# Patient Record
Sex: Male | Born: 1940
Health system: Southern US, Community
[De-identification: ages and names within clinical notes are randomized; demographics above are authoritative.]

## PROBLEM LIST (undated history)

## (undated) DIAGNOSIS — K219 Gastro-esophageal reflux disease without esophagitis: Secondary | ICD-10-CM

## (undated) DIAGNOSIS — I10 Essential (primary) hypertension: Secondary | ICD-10-CM

## (undated) DIAGNOSIS — K7290 Hepatic failure, unspecified without coma: Secondary | ICD-10-CM

## (undated) DIAGNOSIS — R635 Abnormal weight gain: Secondary | ICD-10-CM

## (undated) DIAGNOSIS — C159 Malignant neoplasm of esophagus, unspecified: Secondary | ICD-10-CM

## (undated) DIAGNOSIS — H269 Unspecified cataract: Secondary | ICD-10-CM

## (undated) DIAGNOSIS — R197 Diarrhea, unspecified: Secondary | ICD-10-CM

## (undated) DIAGNOSIS — E876 Hypokalemia: Secondary | ICD-10-CM

## (undated) DIAGNOSIS — E785 Hyperlipidemia, unspecified: Secondary | ICD-10-CM

## (undated) DIAGNOSIS — M1990 Unspecified osteoarthritis, unspecified site: Secondary | ICD-10-CM

## (undated) DIAGNOSIS — N179 Acute kidney failure, unspecified: Secondary | ICD-10-CM

## (undated) DIAGNOSIS — R0602 Shortness of breath: Secondary | ICD-10-CM

## (undated) DIAGNOSIS — H353290 Exudative age-related macular degeneration, unspecified eye, stage unspecified: Secondary | ICD-10-CM

## (undated) DIAGNOSIS — H409 Unspecified glaucoma: Secondary | ICD-10-CM

## (undated) DIAGNOSIS — D509 Iron deficiency anemia, unspecified: Secondary | ICD-10-CM

## (undated) DIAGNOSIS — F1010 Alcohol abuse, uncomplicated: Secondary | ICD-10-CM

## (undated) DIAGNOSIS — R600 Localized edema: Secondary | ICD-10-CM

## (undated) HISTORY — DX: Localized edema: R60.0

## (undated) HISTORY — DX: Malignant neoplasm of esophagus, unspecified: C15.9

## (undated) HISTORY — DX: Other disorders of bilirubin metabolism: E80.6

## (undated) HISTORY — PX: ANKLE SURGERY: SHX546

## (undated) HISTORY — DX: Unspecified glaucoma: H40.9

## (undated) HISTORY — DX: Diarrhea, unspecified: R19.7

## (undated) HISTORY — PX: EYE SURGERY: SHX253

## (undated) HISTORY — DX: Shortness of breath: R06.02

## (undated) HISTORY — DX: Unspecified cataract: H26.9

## (undated) HISTORY — DX: Exudative age-related macular degeneration, unspecified eye, stage unspecified: H35.3290

## (undated) HISTORY — DX: Abnormal weight gain: R63.5

## (undated) HISTORY — DX: Hypokalemia: E87.6

## (undated) HISTORY — DX: Iron deficiency anemia, unspecified: D50.9

## (undated) HISTORY — DX: Alcohol abuse, uncomplicated: F10.10

## (undated) HISTORY — DX: Gastro-esophageal reflux disease without esophagitis: K21.9

## (undated) HISTORY — DX: Hepatic failure, unspecified without coma: K72.90

## (undated) HISTORY — DX: Unspecified osteoarthritis, unspecified site: M19.90

## (undated) HISTORY — PX: CATARACT EXTRACTION, BILATERAL: SHX1313

## (undated) HISTORY — DX: Acute kidney failure, unspecified: N17.9

## (undated) HISTORY — PX: TONSILLECTOMY: SUR1361

---

## 2002-04-17 ENCOUNTER — Encounter (INDEPENDENT_AMBULATORY_CARE_PROVIDER_SITE_OTHER): Payer: Self-pay | Admitting: Specialist

## 2002-04-17 ENCOUNTER — Ambulatory Visit (HOSPITAL_COMMUNITY): Admission: RE | Admit: 2002-04-17 | Discharge: 2002-04-17 | Payer: Self-pay | Admitting: Gastroenterology

## 2004-10-13 ENCOUNTER — Encounter: Admission: RE | Admit: 2004-10-13 | Discharge: 2004-10-13 | Payer: Self-pay | Admitting: Family Medicine

## 2004-10-16 ENCOUNTER — Encounter: Admission: RE | Admit: 2004-10-16 | Discharge: 2004-10-16 | Payer: Self-pay | Admitting: Family Medicine

## 2005-02-17 ENCOUNTER — Encounter: Admission: RE | Admit: 2005-02-17 | Discharge: 2005-02-17 | Payer: Self-pay | Admitting: Family Medicine

## 2005-02-23 ENCOUNTER — Encounter: Admission: RE | Admit: 2005-02-23 | Discharge: 2005-02-23 | Payer: Self-pay | Admitting: Family Medicine

## 2005-05-04 ENCOUNTER — Encounter: Admission: RE | Admit: 2005-05-04 | Discharge: 2005-05-04 | Payer: Self-pay | Admitting: Family Medicine

## 2006-04-12 ENCOUNTER — Encounter: Admission: RE | Admit: 2006-04-12 | Discharge: 2006-04-12 | Payer: Self-pay | Admitting: Family Medicine

## 2008-12-13 ENCOUNTER — Encounter: Admission: RE | Admit: 2008-12-13 | Discharge: 2008-12-13 | Payer: Self-pay | Admitting: Internal Medicine

## 2010-04-21 ENCOUNTER — Other Ambulatory Visit: Payer: Self-pay | Admitting: Dermatology

## 2010-08-15 NOTE — Op Note (Signed)
   NAME:  Randy Wood, Randy Wood NO.:  [id number]   MEDICAL RECORD NO.:  [id number]                    PATIENT TYPE:   LOCATION:                                       FACILITY:   PHYSICIAN:  Cheng C. Madilyn Fireman, M.D.                 DATE OF BIRTH:   DATE OF PROCEDURE:  04/17/2002  DATE OF DISCHARGE:                                 OPERATIVE REPORT   PROCEDURE:  Colonoscopy.   INDICATION FOR PROCEDURE:  Screening colonoscopy.   DESCRIPTION OF PROCEDURE:  The patient was placed in the left lateral  decubitus position and placed on the pulse monitor with continuous low-flow  oxygen delivered by nasal cannula.  He was sedated with 100 mcg IV fentanyl  and 8 mg IV Versed.  The Olympus video colonoscope was inserted into the  rectum and advanced to the cecum, confirmed by transillumination of  McBurney's point and visualization of the ileocecal valve and appendiceal  orifice.  The prep was excellent.  The cecum, ascending, transverse,  descending, and sigmoid colon appeared normal with no masses, polyps,  diverticula, or other mucosal abnormalities.  At the rectosigmoid junction  there was a 6 mm polyp fulgurated by hot biopsy and another 6 mm polyp  fulgurated in the rectum at approximately 5 cm from the anal verge.  The  scope was then withdrawn and the patient returned to the recovery room in  stable condition.  He tolerated the procedure well.  There were no immediate  complications.   IMPRESSION:  Rectosigmoid and rectal polyps.   PLAN:  Await histology to determine method and interval for future colon  screening.                                               Rocko C. Madilyn Fireman, M.D.    J[facility]/MEDQ  D:  04/17/2002  T:  04/17/2002  Job:  [id number]   cc:   Jethro Bastos, M.D.  [address]  [place]  [place] [address]  Fax: [phone]

## 2010-10-03 ENCOUNTER — Other Ambulatory Visit: Payer: Self-pay | Admitting: Dermatology

## 2011-03-13 ENCOUNTER — Ambulatory Visit
Admission: RE | Admit: 2011-03-13 | Discharge: 2011-03-13 | Disposition: A | Discharge status: Home / Self Care | Payer: Medicare Other | Source: Ambulatory Visit | Attending: Allergy and Immunology | Admitting: Allergy and Immunology

## 2011-03-13 ENCOUNTER — Other Ambulatory Visit: Payer: Self-pay | Admitting: Allergy and Immunology

## 2011-03-13 DIAGNOSIS — R059 Cough, unspecified: Secondary | ICD-10-CM

## 2011-03-13 DIAGNOSIS — R05 Cough: Secondary | ICD-10-CM

## 2011-04-06 DIAGNOSIS — J309 Allergic rhinitis, unspecified: Secondary | ICD-10-CM | POA: Diagnosis not present

## 2011-05-07 DIAGNOSIS — M25569 Pain in unspecified knee: Secondary | ICD-10-CM | POA: Diagnosis not present

## 2011-05-07 DIAGNOSIS — Z1331 Encounter for screening for depression: Secondary | ICD-10-CM | POA: Diagnosis not present

## 2011-05-13 DIAGNOSIS — J309 Allergic rhinitis, unspecified: Secondary | ICD-10-CM | POA: Diagnosis not present

## 2011-05-25 ENCOUNTER — Other Ambulatory Visit: Payer: Self-pay | Admitting: Dermatology

## 2011-05-25 DIAGNOSIS — L819 Disorder of pigmentation, unspecified: Secondary | ICD-10-CM | POA: Diagnosis not present

## 2011-05-25 DIAGNOSIS — L821 Other seborrheic keratosis: Secondary | ICD-10-CM | POA: Diagnosis not present

## 2011-05-25 DIAGNOSIS — L089 Local infection of the skin and subcutaneous tissue, unspecified: Secondary | ICD-10-CM | POA: Diagnosis not present

## 2011-05-25 DIAGNOSIS — D239 Other benign neoplasm of skin, unspecified: Secondary | ICD-10-CM | POA: Diagnosis not present

## 2011-05-25 DIAGNOSIS — D485 Neoplasm of uncertain behavior of skin: Secondary | ICD-10-CM | POA: Diagnosis not present

## 2011-05-25 DIAGNOSIS — D1801 Hemangioma of skin and subcutaneous tissue: Secondary | ICD-10-CM | POA: Diagnosis not present

## 2011-05-26 ENCOUNTER — Other Ambulatory Visit: Payer: Self-pay | Admitting: Dermatology

## 2011-05-26 DIAGNOSIS — L908 Other atrophic disorders of skin: Secondary | ICD-10-CM | POA: Diagnosis not present

## 2011-05-26 DIAGNOSIS — Q828 Other specified congenital malformations of skin: Secondary | ICD-10-CM | POA: Diagnosis not present

## 2011-06-04 DIAGNOSIS — T63461A Toxic effect of venom of wasps, accidental (unintentional), initial encounter: Secondary | ICD-10-CM | POA: Diagnosis not present

## 2011-06-04 DIAGNOSIS — T6391XA Toxic effect of contact with unspecified venomous animal, accidental (unintentional), initial encounter: Secondary | ICD-10-CM | POA: Diagnosis not present

## 2011-06-09 DIAGNOSIS — Z961 Presence of intraocular lens: Secondary | ICD-10-CM | POA: Diagnosis not present

## 2011-06-09 DIAGNOSIS — H409 Unspecified glaucoma: Secondary | ICD-10-CM | POA: Diagnosis not present

## 2011-06-09 DIAGNOSIS — H401490 Capsular glaucoma with pseudoexfoliation of lens, unspecified eye, stage unspecified: Secondary | ICD-10-CM | POA: Diagnosis not present

## 2011-06-10 DIAGNOSIS — K219 Gastro-esophageal reflux disease without esophagitis: Secondary | ICD-10-CM | POA: Diagnosis not present

## 2011-06-10 DIAGNOSIS — M7020 Olecranon bursitis, unspecified elbow: Secondary | ICD-10-CM | POA: Diagnosis not present

## 2011-06-10 DIAGNOSIS — I10 Essential (primary) hypertension: Secondary | ICD-10-CM | POA: Diagnosis not present

## 2011-06-10 DIAGNOSIS — M25569 Pain in unspecified knee: Secondary | ICD-10-CM | POA: Diagnosis not present

## 2011-07-06 DIAGNOSIS — J309 Allergic rhinitis, unspecified: Secondary | ICD-10-CM | POA: Diagnosis not present

## 2011-07-08 ENCOUNTER — Other Ambulatory Visit: Payer: Self-pay | Admitting: Dermatology

## 2011-07-08 DIAGNOSIS — D485 Neoplasm of uncertain behavior of skin: Secondary | ICD-10-CM | POA: Diagnosis not present

## 2011-08-10 DIAGNOSIS — J309 Allergic rhinitis, unspecified: Secondary | ICD-10-CM | POA: Diagnosis not present

## 2011-08-25 DIAGNOSIS — L259 Unspecified contact dermatitis, unspecified cause: Secondary | ICD-10-CM | POA: Diagnosis not present

## 2011-09-01 DIAGNOSIS — J309 Allergic rhinitis, unspecified: Secondary | ICD-10-CM | POA: Diagnosis not present

## 2011-10-05 DIAGNOSIS — J309 Allergic rhinitis, unspecified: Secondary | ICD-10-CM | POA: Diagnosis not present

## 2011-11-13 DIAGNOSIS — J309 Allergic rhinitis, unspecified: Secondary | ICD-10-CM | POA: Diagnosis not present

## 2011-11-23 DIAGNOSIS — D239 Other benign neoplasm of skin, unspecified: Secondary | ICD-10-CM | POA: Diagnosis not present

## 2011-11-23 DIAGNOSIS — D1801 Hemangioma of skin and subcutaneous tissue: Secondary | ICD-10-CM | POA: Diagnosis not present

## 2011-12-10 DIAGNOSIS — H353190 Nonexudative age-related macular degeneration, unspecified eye, stage unspecified: Secondary | ICD-10-CM | POA: Diagnosis not present

## 2011-12-10 DIAGNOSIS — H40019 Open angle with borderline findings, low risk, unspecified eye: Secondary | ICD-10-CM | POA: Diagnosis not present

## 2011-12-10 DIAGNOSIS — Z961 Presence of intraocular lens: Secondary | ICD-10-CM | POA: Diagnosis not present

## 2011-12-11 DIAGNOSIS — K219 Gastro-esophageal reflux disease without esophagitis: Secondary | ICD-10-CM | POA: Diagnosis not present

## 2011-12-11 DIAGNOSIS — I10 Essential (primary) hypertension: Secondary | ICD-10-CM | POA: Diagnosis not present

## 2011-12-11 DIAGNOSIS — J309 Allergic rhinitis, unspecified: Secondary | ICD-10-CM | POA: Diagnosis not present

## 2011-12-14 DIAGNOSIS — J3089 Other allergic rhinitis: Secondary | ICD-10-CM | POA: Diagnosis not present

## 2011-12-18 DIAGNOSIS — Z23 Encounter for immunization: Secondary | ICD-10-CM | POA: Diagnosis not present

## 2012-01-11 DIAGNOSIS — S8263XA Displaced fracture of lateral malleolus of unspecified fibula, initial encounter for closed fracture: Secondary | ICD-10-CM | POA: Diagnosis not present

## 2012-01-12 DIAGNOSIS — J309 Allergic rhinitis, unspecified: Secondary | ICD-10-CM | POA: Diagnosis not present

## 2012-01-19 DIAGNOSIS — S8263XA Displaced fracture of lateral malleolus of unspecified fibula, initial encounter for closed fracture: Secondary | ICD-10-CM | POA: Diagnosis not present

## 2012-01-19 DIAGNOSIS — M25569 Pain in unspecified knee: Secondary | ICD-10-CM | POA: Diagnosis not present

## 2012-01-26 DIAGNOSIS — L600 Ingrowing nail: Secondary | ICD-10-CM | POA: Diagnosis not present

## 2012-01-26 DIAGNOSIS — M25549 Pain in joints of unspecified hand: Secondary | ICD-10-CM | POA: Diagnosis not present

## 2012-01-26 DIAGNOSIS — L03019 Cellulitis of unspecified finger: Secondary | ICD-10-CM | POA: Diagnosis not present

## 2012-01-29 ENCOUNTER — Ambulatory Visit (HOSPITAL_COMMUNITY)
Admission: RE | Admit: 2012-01-29 | Discharge: 2012-01-29 | Disposition: A | Discharge status: Home / Self Care | Payer: Medicare Other | Source: Ambulatory Visit | Attending: Family Medicine | Admitting: Family Medicine

## 2012-01-29 DIAGNOSIS — M7989 Other specified soft tissue disorders: Secondary | ICD-10-CM

## 2012-01-29 DIAGNOSIS — R609 Edema, unspecified: Secondary | ICD-10-CM

## 2012-01-29 DIAGNOSIS — S8263XA Displaced fracture of lateral malleolus of unspecified fibula, initial encounter for closed fracture: Secondary | ICD-10-CM | POA: Diagnosis not present

## 2012-01-29 DIAGNOSIS — R52 Pain, unspecified: Secondary | ICD-10-CM

## 2012-01-29 NOTE — Progress Notes (Signed)
*  Preliminary Results* Right lower extremity venous duplex completed. Right lower extremity is negative for deep vein thrombosis. Preliminary results discussed with Morrie Sheldon of Dr. Prince Rome' office.  01/29/2012 10:56 AM Gertie Fey, RDMS, RDCS

## 2012-02-01 DIAGNOSIS — L600 Ingrowing nail: Secondary | ICD-10-CM | POA: Diagnosis not present

## 2012-02-01 DIAGNOSIS — L03019 Cellulitis of unspecified finger: Secondary | ICD-10-CM | POA: Diagnosis not present

## 2012-02-09 DIAGNOSIS — L03019 Cellulitis of unspecified finger: Secondary | ICD-10-CM | POA: Diagnosis not present

## 2012-02-09 DIAGNOSIS — L600 Ingrowing nail: Secondary | ICD-10-CM | POA: Diagnosis not present

## 2012-02-16 DIAGNOSIS — T6391XA Toxic effect of contact with unspecified venomous animal, accidental (unintentional), initial encounter: Secondary | ICD-10-CM | POA: Diagnosis not present

## 2012-02-16 DIAGNOSIS — T63461A Toxic effect of venom of wasps, accidental (unintentional), initial encounter: Secondary | ICD-10-CM | POA: Diagnosis not present

## 2012-02-19 DIAGNOSIS — S8263XA Displaced fracture of lateral malleolus of unspecified fibula, initial encounter for closed fracture: Secondary | ICD-10-CM | POA: Diagnosis not present

## 2012-03-24 DIAGNOSIS — T6391XA Toxic effect of contact with unspecified venomous animal, accidental (unintentional), initial encounter: Secondary | ICD-10-CM | POA: Diagnosis not present

## 2012-04-18 DIAGNOSIS — S8263XA Displaced fracture of lateral malleolus of unspecified fibula, initial encounter for closed fracture: Secondary | ICD-10-CM | POA: Diagnosis not present

## 2012-04-18 DIAGNOSIS — M25579 Pain in unspecified ankle and joints of unspecified foot: Secondary | ICD-10-CM | POA: Diagnosis not present

## 2012-04-19 ENCOUNTER — Other Ambulatory Visit: Payer: Self-pay | Admitting: Family Medicine

## 2012-04-19 DIAGNOSIS — T6391XA Toxic effect of contact with unspecified venomous animal, accidental (unintentional), initial encounter: Secondary | ICD-10-CM | POA: Diagnosis not present

## 2012-04-19 DIAGNOSIS — M25571 Pain in right ankle and joints of right foot: Secondary | ICD-10-CM

## 2012-04-19 DIAGNOSIS — T63461A Toxic effect of venom of wasps, accidental (unintentional), initial encounter: Secondary | ICD-10-CM | POA: Diagnosis not present

## 2012-04-23 ENCOUNTER — Ambulatory Visit
Admission: RE | Admit: 2012-04-23 | Discharge: 2012-04-23 | Disposition: A | Discharge status: Home / Self Care | Payer: Medicare Other | Source: Ambulatory Visit | Attending: Family Medicine | Admitting: Family Medicine

## 2012-04-23 DIAGNOSIS — M7989 Other specified soft tissue disorders: Secondary | ICD-10-CM | POA: Diagnosis not present

## 2012-04-23 DIAGNOSIS — M25571 Pain in right ankle and joints of right foot: Secondary | ICD-10-CM

## 2012-04-27 DIAGNOSIS — S8263XA Displaced fracture of lateral malleolus of unspecified fibula, initial encounter for closed fracture: Secondary | ICD-10-CM | POA: Diagnosis not present

## 2012-05-10 DIAGNOSIS — G8918 Other acute postprocedural pain: Secondary | ICD-10-CM | POA: Diagnosis not present

## 2012-05-10 DIAGNOSIS — IMO0002 Reserved for concepts with insufficient information to code with codable children: Secondary | ICD-10-CM | POA: Diagnosis not present

## 2012-05-10 DIAGNOSIS — S8263XA Displaced fracture of lateral malleolus of unspecified fibula, initial encounter for closed fracture: Secondary | ICD-10-CM | POA: Diagnosis not present

## 2012-05-24 DIAGNOSIS — S8263XA Displaced fracture of lateral malleolus of unspecified fibula, initial encounter for closed fracture: Secondary | ICD-10-CM | POA: Diagnosis not present

## 2012-05-26 DIAGNOSIS — T63461A Toxic effect of venom of wasps, accidental (unintentional), initial encounter: Secondary | ICD-10-CM | POA: Diagnosis not present

## 2012-05-26 DIAGNOSIS — T6391XA Toxic effect of contact with unspecified venomous animal, accidental (unintentional), initial encounter: Secondary | ICD-10-CM | POA: Diagnosis not present

## 2012-06-09 ENCOUNTER — Other Ambulatory Visit: Payer: Self-pay | Admitting: Gastroenterology

## 2012-06-09 DIAGNOSIS — Z09 Encounter for follow-up examination after completed treatment for conditions other than malignant neoplasm: Secondary | ICD-10-CM | POA: Diagnosis not present

## 2012-06-09 DIAGNOSIS — D126 Benign neoplasm of colon, unspecified: Secondary | ICD-10-CM | POA: Diagnosis not present

## 2012-06-09 DIAGNOSIS — Z86010 Personal history of colonic polyps: Secondary | ICD-10-CM | POA: Diagnosis not present

## 2012-06-17 DIAGNOSIS — S8263XA Displaced fracture of lateral malleolus of unspecified fibula, initial encounter for closed fracture: Secondary | ICD-10-CM | POA: Diagnosis not present

## 2012-07-06 DIAGNOSIS — J3089 Other allergic rhinitis: Secondary | ICD-10-CM | POA: Diagnosis not present

## 2012-07-14 DIAGNOSIS — S8263XA Displaced fracture of lateral malleolus of unspecified fibula, initial encounter for closed fracture: Secondary | ICD-10-CM | POA: Diagnosis not present

## 2012-07-21 DIAGNOSIS — H409 Unspecified glaucoma: Secondary | ICD-10-CM | POA: Diagnosis not present

## 2012-07-21 DIAGNOSIS — H40019 Open angle with borderline findings, low risk, unspecified eye: Secondary | ICD-10-CM | POA: Diagnosis not present

## 2012-07-21 DIAGNOSIS — H401490 Capsular glaucoma with pseudoexfoliation of lens, unspecified eye, stage unspecified: Secondary | ICD-10-CM | POA: Diagnosis not present

## 2012-08-03 DIAGNOSIS — Z1331 Encounter for screening for depression: Secondary | ICD-10-CM | POA: Diagnosis not present

## 2012-08-03 DIAGNOSIS — Z0000 Encounter for general adult medical examination without abnormal findings: Secondary | ICD-10-CM | POA: Diagnosis not present

## 2012-08-03 DIAGNOSIS — E785 Hyperlipidemia, unspecified: Secondary | ICD-10-CM | POA: Diagnosis not present

## 2012-08-03 DIAGNOSIS — Z125 Encounter for screening for malignant neoplasm of prostate: Secondary | ICD-10-CM | POA: Diagnosis not present

## 2012-08-03 DIAGNOSIS — I10 Essential (primary) hypertension: Secondary | ICD-10-CM | POA: Diagnosis not present

## 2012-08-08 DIAGNOSIS — T6391XA Toxic effect of contact with unspecified venomous animal, accidental (unintentional), initial encounter: Secondary | ICD-10-CM | POA: Diagnosis not present

## 2012-09-12 DIAGNOSIS — I10 Essential (primary) hypertension: Secondary | ICD-10-CM | POA: Diagnosis not present

## 2012-09-19 DIAGNOSIS — T63461A Toxic effect of venom of wasps, accidental (unintentional), initial encounter: Secondary | ICD-10-CM | POA: Diagnosis not present

## 2012-09-19 DIAGNOSIS — T6391XA Toxic effect of contact with unspecified venomous animal, accidental (unintentional), initial encounter: Secondary | ICD-10-CM | POA: Diagnosis not present

## 2012-10-21 DIAGNOSIS — J309 Allergic rhinitis, unspecified: Secondary | ICD-10-CM | POA: Diagnosis not present

## 2012-11-21 DIAGNOSIS — J309 Allergic rhinitis, unspecified: Secondary | ICD-10-CM | POA: Diagnosis not present

## 2012-11-25 DIAGNOSIS — J309 Allergic rhinitis, unspecified: Secondary | ICD-10-CM | POA: Diagnosis not present

## 2012-11-29 DIAGNOSIS — D239 Other benign neoplasm of skin, unspecified: Secondary | ICD-10-CM | POA: Diagnosis not present

## 2012-11-29 DIAGNOSIS — L819 Disorder of pigmentation, unspecified: Secondary | ICD-10-CM | POA: Diagnosis not present

## 2012-11-29 DIAGNOSIS — L821 Other seborrheic keratosis: Secondary | ICD-10-CM | POA: Diagnosis not present

## 2012-11-29 DIAGNOSIS — D1801 Hemangioma of skin and subcutaneous tissue: Secondary | ICD-10-CM | POA: Diagnosis not present

## 2012-11-29 DIAGNOSIS — B079 Viral wart, unspecified: Secondary | ICD-10-CM | POA: Diagnosis not present

## 2012-11-29 DIAGNOSIS — L719 Rosacea, unspecified: Secondary | ICD-10-CM | POA: Diagnosis not present

## 2012-11-29 DIAGNOSIS — D485 Neoplasm of uncertain behavior of skin: Secondary | ICD-10-CM | POA: Diagnosis not present

## 2012-12-13 DIAGNOSIS — M1710 Unilateral primary osteoarthritis, unspecified knee: Secondary | ICD-10-CM | POA: Diagnosis not present

## 2012-12-13 DIAGNOSIS — M25569 Pain in unspecified knee: Secondary | ICD-10-CM | POA: Diagnosis not present

## 2012-12-27 DIAGNOSIS — Z23 Encounter for immunization: Secondary | ICD-10-CM | POA: Diagnosis not present

## 2012-12-27 DIAGNOSIS — J309 Allergic rhinitis, unspecified: Secondary | ICD-10-CM | POA: Diagnosis not present

## 2013-01-18 DIAGNOSIS — H409 Unspecified glaucoma: Secondary | ICD-10-CM | POA: Diagnosis not present

## 2013-01-18 DIAGNOSIS — H401490 Capsular glaucoma with pseudoexfoliation of lens, unspecified eye, stage unspecified: Secondary | ICD-10-CM | POA: Diagnosis not present

## 2013-02-07 DIAGNOSIS — J309 Allergic rhinitis, unspecified: Secondary | ICD-10-CM | POA: Diagnosis not present

## 2013-03-10 DIAGNOSIS — J309 Allergic rhinitis, unspecified: Secondary | ICD-10-CM | POA: Diagnosis not present

## 2013-03-15 DIAGNOSIS — K219 Gastro-esophageal reflux disease without esophagitis: Secondary | ICD-10-CM | POA: Diagnosis not present

## 2013-03-15 DIAGNOSIS — E785 Hyperlipidemia, unspecified: Secondary | ICD-10-CM | POA: Diagnosis not present

## 2013-03-15 DIAGNOSIS — I10 Essential (primary) hypertension: Secondary | ICD-10-CM | POA: Diagnosis not present

## 2013-04-13 DIAGNOSIS — T63461A Toxic effect of venom of wasps, accidental (unintentional), initial encounter: Secondary | ICD-10-CM | POA: Diagnosis not present

## 2013-04-13 DIAGNOSIS — T6391XA Toxic effect of contact with unspecified venomous animal, accidental (unintentional), initial encounter: Secondary | ICD-10-CM | POA: Diagnosis not present

## 2013-05-12 DIAGNOSIS — J309 Allergic rhinitis, unspecified: Secondary | ICD-10-CM | POA: Diagnosis not present

## 2013-06-12 DIAGNOSIS — J309 Allergic rhinitis, unspecified: Secondary | ICD-10-CM | POA: Diagnosis not present

## 2013-07-19 DIAGNOSIS — H401490 Capsular glaucoma with pseudoexfoliation of lens, unspecified eye, stage unspecified: Secondary | ICD-10-CM | POA: Diagnosis not present

## 2013-07-19 DIAGNOSIS — Z961 Presence of intraocular lens: Secondary | ICD-10-CM | POA: Diagnosis not present

## 2013-07-21 DIAGNOSIS — J3089 Other allergic rhinitis: Secondary | ICD-10-CM | POA: Diagnosis not present

## 2013-07-21 DIAGNOSIS — J309 Allergic rhinitis, unspecified: Secondary | ICD-10-CM | POA: Diagnosis not present

## 2013-07-27 DIAGNOSIS — M25579 Pain in unspecified ankle and joints of unspecified foot: Secondary | ICD-10-CM | POA: Diagnosis not present

## 2013-07-27 DIAGNOSIS — M19079 Primary osteoarthritis, unspecified ankle and foot: Secondary | ICD-10-CM | POA: Diagnosis not present

## 2013-07-27 DIAGNOSIS — M25569 Pain in unspecified knee: Secondary | ICD-10-CM | POA: Diagnosis not present

## 2013-08-15 DIAGNOSIS — M19079 Primary osteoarthritis, unspecified ankle and foot: Secondary | ICD-10-CM | POA: Diagnosis not present

## 2013-09-12 DIAGNOSIS — T63461A Toxic effect of venom of wasps, accidental (unintentional), initial encounter: Secondary | ICD-10-CM | POA: Diagnosis not present

## 2013-09-12 DIAGNOSIS — T6391XA Toxic effect of contact with unspecified venomous animal, accidental (unintentional), initial encounter: Secondary | ICD-10-CM | POA: Diagnosis not present

## 2013-09-14 DIAGNOSIS — R7989 Other specified abnormal findings of blood chemistry: Secondary | ICD-10-CM | POA: Diagnosis not present

## 2013-09-14 DIAGNOSIS — K219 Gastro-esophageal reflux disease without esophagitis: Secondary | ICD-10-CM | POA: Diagnosis not present

## 2013-09-14 DIAGNOSIS — R404 Transient alteration of awareness: Secondary | ICD-10-CM | POA: Diagnosis not present

## 2013-09-14 DIAGNOSIS — I10 Essential (primary) hypertension: Secondary | ICD-10-CM | POA: Diagnosis not present

## 2013-09-14 DIAGNOSIS — Z1331 Encounter for screening for depression: Secondary | ICD-10-CM | POA: Diagnosis not present

## 2013-09-14 DIAGNOSIS — R635 Abnormal weight gain: Secondary | ICD-10-CM | POA: Diagnosis not present

## 2013-09-14 DIAGNOSIS — Z23 Encounter for immunization: Secondary | ICD-10-CM | POA: Diagnosis not present

## 2013-09-14 DIAGNOSIS — E785 Hyperlipidemia, unspecified: Secondary | ICD-10-CM | POA: Diagnosis not present

## 2013-09-18 DIAGNOSIS — J309 Allergic rhinitis, unspecified: Secondary | ICD-10-CM | POA: Diagnosis not present

## 2013-10-16 DIAGNOSIS — J309 Allergic rhinitis, unspecified: Secondary | ICD-10-CM | POA: Diagnosis not present

## 2013-10-26 DIAGNOSIS — R7301 Impaired fasting glucose: Secondary | ICD-10-CM | POA: Diagnosis not present

## 2013-10-26 DIAGNOSIS — Z79899 Other long term (current) drug therapy: Secondary | ICD-10-CM | POA: Diagnosis not present

## 2013-10-26 DIAGNOSIS — E669 Obesity, unspecified: Secondary | ICD-10-CM | POA: Diagnosis not present

## 2013-10-26 DIAGNOSIS — Z6832 Body mass index (BMI) 32.0-32.9, adult: Secondary | ICD-10-CM | POA: Diagnosis not present

## 2013-10-26 DIAGNOSIS — R0609 Other forms of dyspnea: Secondary | ICD-10-CM | POA: Diagnosis not present

## 2013-10-26 DIAGNOSIS — I10 Essential (primary) hypertension: Secondary | ICD-10-CM | POA: Diagnosis not present

## 2013-10-26 DIAGNOSIS — R0989 Other specified symptoms and signs involving the circulatory and respiratory systems: Secondary | ICD-10-CM | POA: Diagnosis not present

## 2013-11-15 DIAGNOSIS — J309 Allergic rhinitis, unspecified: Secondary | ICD-10-CM | POA: Diagnosis not present

## 2013-11-29 DIAGNOSIS — D239 Other benign neoplasm of skin, unspecified: Secondary | ICD-10-CM | POA: Diagnosis not present

## 2013-11-29 DIAGNOSIS — L570 Actinic keratosis: Secondary | ICD-10-CM | POA: Diagnosis not present

## 2013-11-29 DIAGNOSIS — D1801 Hemangioma of skin and subcutaneous tissue: Secondary | ICD-10-CM | POA: Diagnosis not present

## 2013-11-29 DIAGNOSIS — L821 Other seborrheic keratosis: Secondary | ICD-10-CM | POA: Diagnosis not present

## 2013-12-19 DIAGNOSIS — J309 Allergic rhinitis, unspecified: Secondary | ICD-10-CM | POA: Diagnosis not present

## 2013-12-26 DIAGNOSIS — Z23 Encounter for immunization: Secondary | ICD-10-CM | POA: Diagnosis not present

## 2014-01-19 DIAGNOSIS — T63451D Toxic effect of venom of hornets, accidental (unintentional), subsequent encounter: Secondary | ICD-10-CM | POA: Diagnosis not present

## 2014-01-19 DIAGNOSIS — T63441D Toxic effect of venom of bees, accidental (unintentional), subsequent encounter: Secondary | ICD-10-CM | POA: Diagnosis not present

## 2014-01-24 DIAGNOSIS — H3531 Nonexudative age-related macular degeneration: Secondary | ICD-10-CM | POA: Diagnosis not present

## 2014-01-24 DIAGNOSIS — Z961 Presence of intraocular lens: Secondary | ICD-10-CM | POA: Diagnosis not present

## 2014-01-24 DIAGNOSIS — H401421 Capsular glaucoma with pseudoexfoliation of lens, left eye, mild stage: Secondary | ICD-10-CM | POA: Diagnosis not present

## 2014-02-13 DIAGNOSIS — T63451D Toxic effect of venom of hornets, accidental (unintentional), subsequent encounter: Secondary | ICD-10-CM | POA: Diagnosis not present

## 2014-02-13 DIAGNOSIS — T63441D Toxic effect of venom of bees, accidental (unintentional), subsequent encounter: Secondary | ICD-10-CM | POA: Diagnosis not present

## 2014-03-09 DIAGNOSIS — I10 Essential (primary) hypertension: Secondary | ICD-10-CM | POA: Diagnosis not present

## 2014-03-19 DIAGNOSIS — T63461D Toxic effect of venom of wasps, accidental (unintentional), subsequent encounter: Secondary | ICD-10-CM | POA: Diagnosis not present

## 2014-03-19 DIAGNOSIS — T63451D Toxic effect of venom of hornets, accidental (unintentional), subsequent encounter: Secondary | ICD-10-CM | POA: Diagnosis not present

## 2014-03-29 DIAGNOSIS — M25571 Pain in right ankle and joints of right foot: Secondary | ICD-10-CM | POA: Diagnosis not present

## 2014-04-19 DIAGNOSIS — T63441D Toxic effect of venom of bees, accidental (unintentional), subsequent encounter: Secondary | ICD-10-CM | POA: Diagnosis not present

## 2014-04-19 DIAGNOSIS — T63451D Toxic effect of venom of hornets, accidental (unintentional), subsequent encounter: Secondary | ICD-10-CM | POA: Diagnosis not present

## 2014-05-18 DIAGNOSIS — T63451D Toxic effect of venom of hornets, accidental (unintentional), subsequent encounter: Secondary | ICD-10-CM | POA: Diagnosis not present

## 2014-05-18 DIAGNOSIS — T63441D Toxic effect of venom of bees, accidental (unintentional), subsequent encounter: Secondary | ICD-10-CM | POA: Diagnosis not present

## 2014-06-19 DIAGNOSIS — T63441D Toxic effect of venom of bees, accidental (unintentional), subsequent encounter: Secondary | ICD-10-CM | POA: Diagnosis not present

## 2014-06-19 DIAGNOSIS — T63451D Toxic effect of venom of hornets, accidental (unintentional), subsequent encounter: Secondary | ICD-10-CM | POA: Diagnosis not present

## 2014-07-20 DIAGNOSIS — T63441D Toxic effect of venom of bees, accidental (unintentional), subsequent encounter: Secondary | ICD-10-CM | POA: Diagnosis not present

## 2014-07-20 DIAGNOSIS — J3089 Other allergic rhinitis: Secondary | ICD-10-CM | POA: Diagnosis not present

## 2014-07-20 DIAGNOSIS — T63451D Toxic effect of venom of hornets, accidental (unintentional), subsequent encounter: Secondary | ICD-10-CM | POA: Diagnosis not present

## 2014-07-25 DIAGNOSIS — H401421 Capsular glaucoma with pseudoexfoliation of lens, left eye, mild stage: Secondary | ICD-10-CM | POA: Diagnosis not present

## 2014-08-20 DIAGNOSIS — T63441D Toxic effect of venom of bees, accidental (unintentional), subsequent encounter: Secondary | ICD-10-CM | POA: Diagnosis not present

## 2014-08-20 DIAGNOSIS — T63451D Toxic effect of venom of hornets, accidental (unintentional), subsequent encounter: Secondary | ICD-10-CM | POA: Diagnosis not present

## 2014-08-20 DIAGNOSIS — T63461D Toxic effect of venom of wasps, accidental (unintentional), subsequent encounter: Secondary | ICD-10-CM | POA: Diagnosis not present

## 2014-09-10 DIAGNOSIS — M152 Bouchard's nodes (with arthropathy): Secondary | ICD-10-CM | POA: Diagnosis not present

## 2014-09-10 DIAGNOSIS — G250 Essential tremor: Secondary | ICD-10-CM | POA: Diagnosis not present

## 2014-09-10 DIAGNOSIS — I10 Essential (primary) hypertension: Secondary | ICD-10-CM | POA: Diagnosis not present

## 2014-09-10 DIAGNOSIS — K219 Gastro-esophageal reflux disease without esophagitis: Secondary | ICD-10-CM | POA: Diagnosis not present

## 2014-09-10 DIAGNOSIS — Z79899 Other long term (current) drug therapy: Secondary | ICD-10-CM | POA: Diagnosis not present

## 2014-09-10 DIAGNOSIS — Z0000 Encounter for general adult medical examination without abnormal findings: Secondary | ICD-10-CM | POA: Diagnosis not present

## 2014-09-10 DIAGNOSIS — Z1389 Encounter for screening for other disorder: Secondary | ICD-10-CM | POA: Diagnosis not present

## 2014-09-10 DIAGNOSIS — Z5181 Encounter for therapeutic drug level monitoring: Secondary | ICD-10-CM | POA: Diagnosis not present

## 2014-09-10 DIAGNOSIS — E780 Pure hypercholesterolemia: Secondary | ICD-10-CM | POA: Diagnosis not present

## 2014-09-19 DIAGNOSIS — T63441D Toxic effect of venom of bees, accidental (unintentional), subsequent encounter: Secondary | ICD-10-CM | POA: Diagnosis not present

## 2014-09-19 DIAGNOSIS — T63461D Toxic effect of venom of wasps, accidental (unintentional), subsequent encounter: Secondary | ICD-10-CM | POA: Diagnosis not present

## 2014-10-18 DIAGNOSIS — T63451D Toxic effect of venom of hornets, accidental (unintentional), subsequent encounter: Secondary | ICD-10-CM | POA: Diagnosis not present

## 2014-10-18 DIAGNOSIS — T63461D Toxic effect of venom of wasps, accidental (unintentional), subsequent encounter: Secondary | ICD-10-CM | POA: Diagnosis not present

## 2014-10-18 DIAGNOSIS — T63441D Toxic effect of venom of bees, accidental (unintentional), subsequent encounter: Secondary | ICD-10-CM | POA: Diagnosis not present

## 2014-11-16 DIAGNOSIS — T63451D Toxic effect of venom of hornets, accidental (unintentional), subsequent encounter: Secondary | ICD-10-CM | POA: Diagnosis not present

## 2014-11-16 DIAGNOSIS — T63441D Toxic effect of venom of bees, accidental (unintentional), subsequent encounter: Secondary | ICD-10-CM | POA: Diagnosis not present

## 2014-11-30 DIAGNOSIS — D225 Melanocytic nevi of trunk: Secondary | ICD-10-CM | POA: Diagnosis not present

## 2014-11-30 DIAGNOSIS — L853 Xerosis cutis: Secondary | ICD-10-CM | POA: Diagnosis not present

## 2014-11-30 DIAGNOSIS — D1801 Hemangioma of skin and subcutaneous tissue: Secondary | ICD-10-CM | POA: Diagnosis not present

## 2014-11-30 DIAGNOSIS — L718 Other rosacea: Secondary | ICD-10-CM | POA: Diagnosis not present

## 2014-11-30 DIAGNOSIS — L570 Actinic keratosis: Secondary | ICD-10-CM | POA: Diagnosis not present

## 2014-11-30 DIAGNOSIS — D2262 Melanocytic nevi of left upper limb, including shoulder: Secondary | ICD-10-CM | POA: Diagnosis not present

## 2014-11-30 DIAGNOSIS — L821 Other seborrheic keratosis: Secondary | ICD-10-CM | POA: Diagnosis not present

## 2014-11-30 DIAGNOSIS — D224 Melanocytic nevi of scalp and neck: Secondary | ICD-10-CM | POA: Diagnosis not present

## 2014-11-30 DIAGNOSIS — D2261 Melanocytic nevi of right upper limb, including shoulder: Secondary | ICD-10-CM | POA: Diagnosis not present

## 2014-12-17 DIAGNOSIS — T63441D Toxic effect of venom of bees, accidental (unintentional), subsequent encounter: Secondary | ICD-10-CM | POA: Diagnosis not present

## 2014-12-17 DIAGNOSIS — T63451D Toxic effect of venom of hornets, accidental (unintentional), subsequent encounter: Secondary | ICD-10-CM | POA: Diagnosis not present

## 2014-12-24 DIAGNOSIS — T63441D Toxic effect of venom of bees, accidental (unintentional), subsequent encounter: Secondary | ICD-10-CM | POA: Diagnosis not present

## 2014-12-24 DIAGNOSIS — L718 Other rosacea: Secondary | ICD-10-CM | POA: Diagnosis not present

## 2014-12-28 DIAGNOSIS — Z23 Encounter for immunization: Secondary | ICD-10-CM | POA: Diagnosis not present

## 2014-12-31 DIAGNOSIS — T63441D Toxic effect of venom of bees, accidental (unintentional), subsequent encounter: Secondary | ICD-10-CM | POA: Diagnosis not present

## 2015-01-09 DIAGNOSIS — T63441D Toxic effect of venom of bees, accidental (unintentional), subsequent encounter: Secondary | ICD-10-CM | POA: Diagnosis not present

## 2015-01-16 DIAGNOSIS — T63441D Toxic effect of venom of bees, accidental (unintentional), subsequent encounter: Secondary | ICD-10-CM | POA: Diagnosis not present

## 2015-01-16 DIAGNOSIS — T63451D Toxic effect of venom of hornets, accidental (unintentional), subsequent encounter: Secondary | ICD-10-CM | POA: Diagnosis not present

## 2015-02-05 DIAGNOSIS — T63441D Toxic effect of venom of bees, accidental (unintentional), subsequent encounter: Secondary | ICD-10-CM | POA: Diagnosis not present

## 2015-02-12 DIAGNOSIS — T63441D Toxic effect of venom of bees, accidental (unintentional), subsequent encounter: Secondary | ICD-10-CM | POA: Diagnosis not present

## 2015-02-12 DIAGNOSIS — T63451D Toxic effect of venom of hornets, accidental (unintentional), subsequent encounter: Secondary | ICD-10-CM | POA: Diagnosis not present

## 2015-03-04 DIAGNOSIS — H353122 Nonexudative age-related macular degeneration, left eye, intermediate dry stage: Secondary | ICD-10-CM | POA: Diagnosis not present

## 2015-03-04 DIAGNOSIS — H40012 Open angle with borderline findings, low risk, left eye: Secondary | ICD-10-CM | POA: Diagnosis not present

## 2015-03-04 DIAGNOSIS — H353112 Nonexudative age-related macular degeneration, right eye, intermediate dry stage: Secondary | ICD-10-CM | POA: Diagnosis not present

## 2015-03-14 DIAGNOSIS — I10 Essential (primary) hypertension: Secondary | ICD-10-CM | POA: Diagnosis not present

## 2015-03-14 DIAGNOSIS — M19079 Primary osteoarthritis, unspecified ankle and foot: Secondary | ICD-10-CM | POA: Diagnosis not present

## 2015-03-14 DIAGNOSIS — K219 Gastro-esophageal reflux disease without esophagitis: Secondary | ICD-10-CM | POA: Diagnosis not present

## 2015-03-14 DIAGNOSIS — J300 Vasomotor rhinitis: Secondary | ICD-10-CM | POA: Diagnosis not present

## 2015-03-14 DIAGNOSIS — R7301 Impaired fasting glucose: Secondary | ICD-10-CM | POA: Diagnosis not present

## 2015-03-14 DIAGNOSIS — E538 Deficiency of other specified B group vitamins: Secondary | ICD-10-CM | POA: Diagnosis not present

## 2015-03-15 DIAGNOSIS — T63441D Toxic effect of venom of bees, accidental (unintentional), subsequent encounter: Secondary | ICD-10-CM | POA: Diagnosis not present

## 2015-03-15 DIAGNOSIS — T63451D Toxic effect of venom of hornets, accidental (unintentional), subsequent encounter: Secondary | ICD-10-CM | POA: Diagnosis not present

## 2015-04-01 ENCOUNTER — Other Ambulatory Visit: Payer: Self-pay | Admitting: Allergy and Immunology

## 2015-04-16 DIAGNOSIS — T63451D Toxic effect of venom of hornets, accidental (unintentional), subsequent encounter: Secondary | ICD-10-CM | POA: Diagnosis not present

## 2015-05-21 DIAGNOSIS — T63451D Toxic effect of venom of hornets, accidental (unintentional), subsequent encounter: Secondary | ICD-10-CM | POA: Diagnosis not present

## 2015-06-14 DIAGNOSIS — T63451D Toxic effect of venom of hornets, accidental (unintentional), subsequent encounter: Secondary | ICD-10-CM | POA: Diagnosis not present

## 2015-06-14 DIAGNOSIS — T63461D Toxic effect of venom of wasps, accidental (unintentional), subsequent encounter: Secondary | ICD-10-CM | POA: Diagnosis not present

## 2015-06-24 DIAGNOSIS — L111 Transient acantholytic dermatosis [Grover]: Secondary | ICD-10-CM | POA: Diagnosis not present

## 2015-07-01 ENCOUNTER — Other Ambulatory Visit: Payer: Self-pay | Admitting: Allergy and Immunology

## 2015-07-15 DIAGNOSIS — T63441D Toxic effect of venom of bees, accidental (unintentional), subsequent encounter: Secondary | ICD-10-CM | POA: Diagnosis not present

## 2015-07-15 DIAGNOSIS — J3089 Other allergic rhinitis: Secondary | ICD-10-CM | POA: Diagnosis not present

## 2015-07-15 DIAGNOSIS — T63451D Toxic effect of venom of hornets, accidental (unintentional), subsequent encounter: Secondary | ICD-10-CM | POA: Diagnosis not present

## 2015-08-12 DIAGNOSIS — T63451D Toxic effect of venom of hornets, accidental (unintentional), subsequent encounter: Secondary | ICD-10-CM | POA: Diagnosis not present

## 2015-09-09 DIAGNOSIS — H353132 Nonexudative age-related macular degeneration, bilateral, intermediate dry stage: Secondary | ICD-10-CM | POA: Diagnosis not present

## 2015-09-09 DIAGNOSIS — D2311 Other benign neoplasm of skin of right eyelid, including canthus: Secondary | ICD-10-CM | POA: Diagnosis not present

## 2015-09-09 DIAGNOSIS — H40052 Ocular hypertension, left eye: Secondary | ICD-10-CM | POA: Diagnosis not present

## 2015-09-09 DIAGNOSIS — H40012 Open angle with borderline findings, low risk, left eye: Secondary | ICD-10-CM | POA: Diagnosis not present

## 2015-09-12 DIAGNOSIS — E538 Deficiency of other specified B group vitamins: Secondary | ICD-10-CM | POA: Diagnosis not present

## 2015-09-12 DIAGNOSIS — E7800 Pure hypercholesterolemia, unspecified: Secondary | ICD-10-CM | POA: Diagnosis not present

## 2015-09-12 DIAGNOSIS — K219 Gastro-esophageal reflux disease without esophagitis: Secondary | ICD-10-CM | POA: Diagnosis not present

## 2015-09-12 DIAGNOSIS — I10 Essential (primary) hypertension: Secondary | ICD-10-CM | POA: Diagnosis not present

## 2015-09-13 DIAGNOSIS — T63451D Toxic effect of venom of hornets, accidental (unintentional), subsequent encounter: Secondary | ICD-10-CM | POA: Diagnosis not present

## 2015-10-03 DIAGNOSIS — M1711 Unilateral primary osteoarthritis, right knee: Secondary | ICD-10-CM | POA: Diagnosis not present

## 2015-10-21 ENCOUNTER — Other Ambulatory Visit: Payer: Self-pay | Admitting: Allergy and Immunology

## 2015-10-21 NOTE — Telephone Encounter (Signed)
Please change the settings on Epic so that I do not receive medication refill approval. Please take care of this approval.

## 2015-10-28 DIAGNOSIS — T63451D Toxic effect of venom of hornets, accidental (unintentional), subsequent encounter: Secondary | ICD-10-CM | POA: Diagnosis not present

## 2015-10-31 DIAGNOSIS — M1711 Unilateral primary osteoarthritis, right knee: Secondary | ICD-10-CM | POA: Diagnosis not present

## 2015-11-13 DIAGNOSIS — M1711 Unilateral primary osteoarthritis, right knee: Secondary | ICD-10-CM | POA: Diagnosis not present

## 2015-11-20 DIAGNOSIS — T63451D Toxic effect of venom of hornets, accidental (unintentional), subsequent encounter: Secondary | ICD-10-CM | POA: Diagnosis not present

## 2015-12-13 DIAGNOSIS — T63441D Toxic effect of venom of bees, accidental (unintentional), subsequent encounter: Secondary | ICD-10-CM | POA: Diagnosis not present

## 2015-12-13 DIAGNOSIS — T63451D Toxic effect of venom of hornets, accidental (unintentional), subsequent encounter: Secondary | ICD-10-CM | POA: Diagnosis not present

## 2015-12-18 DIAGNOSIS — D1801 Hemangioma of skin and subcutaneous tissue: Secondary | ICD-10-CM | POA: Diagnosis not present

## 2015-12-18 DIAGNOSIS — D2272 Melanocytic nevi of left lower limb, including hip: Secondary | ICD-10-CM | POA: Diagnosis not present

## 2015-12-18 DIAGNOSIS — D2271 Melanocytic nevi of right lower limb, including hip: Secondary | ICD-10-CM | POA: Diagnosis not present

## 2015-12-18 DIAGNOSIS — D225 Melanocytic nevi of trunk: Secondary | ICD-10-CM | POA: Diagnosis not present

## 2015-12-18 DIAGNOSIS — D485 Neoplasm of uncertain behavior of skin: Secondary | ICD-10-CM | POA: Diagnosis not present

## 2015-12-18 DIAGNOSIS — L821 Other seborrheic keratosis: Secondary | ICD-10-CM | POA: Diagnosis not present

## 2015-12-20 DIAGNOSIS — T63441D Toxic effect of venom of bees, accidental (unintentional), subsequent encounter: Secondary | ICD-10-CM | POA: Diagnosis not present

## 2015-12-27 DIAGNOSIS — T63441D Toxic effect of venom of bees, accidental (unintentional), subsequent encounter: Secondary | ICD-10-CM | POA: Diagnosis not present

## 2016-01-03 DIAGNOSIS — Z23 Encounter for immunization: Secondary | ICD-10-CM | POA: Diagnosis not present

## 2016-01-03 DIAGNOSIS — T63441D Toxic effect of venom of bees, accidental (unintentional), subsequent encounter: Secondary | ICD-10-CM | POA: Diagnosis not present

## 2016-01-10 DIAGNOSIS — T63461D Toxic effect of venom of wasps, accidental (unintentional), subsequent encounter: Secondary | ICD-10-CM | POA: Diagnosis not present

## 2016-01-10 DIAGNOSIS — T63441D Toxic effect of venom of bees, accidental (unintentional), subsequent encounter: Secondary | ICD-10-CM | POA: Diagnosis not present

## 2016-01-20 DIAGNOSIS — T63441D Toxic effect of venom of bees, accidental (unintentional), subsequent encounter: Secondary | ICD-10-CM | POA: Diagnosis not present

## 2016-01-27 DIAGNOSIS — T63441D Toxic effect of venom of bees, accidental (unintentional), subsequent encounter: Secondary | ICD-10-CM | POA: Diagnosis not present

## 2016-02-03 DIAGNOSIS — T63441D Toxic effect of venom of bees, accidental (unintentional), subsequent encounter: Secondary | ICD-10-CM | POA: Diagnosis not present

## 2016-02-03 DIAGNOSIS — T63451D Toxic effect of venom of hornets, accidental (unintentional), subsequent encounter: Secondary | ICD-10-CM | POA: Diagnosis not present

## 2016-02-10 DIAGNOSIS — T63441D Toxic effect of venom of bees, accidental (unintentional), subsequent encounter: Secondary | ICD-10-CM | POA: Diagnosis not present

## 2016-02-11 DIAGNOSIS — T63451D Toxic effect of venom of hornets, accidental (unintentional), subsequent encounter: Secondary | ICD-10-CM | POA: Diagnosis not present

## 2016-02-17 ENCOUNTER — Telehealth (HOSPITAL_COMMUNITY): Payer: Self-pay | Admitting: *Deleted

## 2016-02-17 NOTE — Telephone Encounter (Signed)
Entered in error

## 2016-02-24 DIAGNOSIS — T63441D Toxic effect of venom of bees, accidental (unintentional), subsequent encounter: Secondary | ICD-10-CM | POA: Diagnosis not present

## 2016-03-02 DIAGNOSIS — T63441D Toxic effect of venom of bees, accidental (unintentional), subsequent encounter: Secondary | ICD-10-CM | POA: Diagnosis not present

## 2016-07-06 DIAGNOSIS — I10 Essential (primary) hypertension: Secondary | ICD-10-CM | POA: Diagnosis not present

## 2016-07-15 DIAGNOSIS — T63451D Toxic effect of venom of hornets, accidental (unintentional), subsequent encounter: Secondary | ICD-10-CM | POA: Diagnosis not present

## 2016-07-15 DIAGNOSIS — T63441D Toxic effect of venom of bees, accidental (unintentional), subsequent encounter: Secondary | ICD-10-CM | POA: Diagnosis not present

## 2016-07-15 DIAGNOSIS — J3089 Other allergic rhinitis: Secondary | ICD-10-CM | POA: Diagnosis not present

## 2016-07-22 DIAGNOSIS — T63451D Toxic effect of venom of hornets, accidental (unintentional), subsequent encounter: Secondary | ICD-10-CM | POA: Diagnosis not present

## 2016-07-22 DIAGNOSIS — T63441D Toxic effect of venom of bees, accidental (unintentional), subsequent encounter: Secondary | ICD-10-CM | POA: Diagnosis not present

## 2016-07-29 DIAGNOSIS — T63441D Toxic effect of venom of bees, accidental (unintentional), subsequent encounter: Secondary | ICD-10-CM | POA: Diagnosis not present

## 2016-07-29 DIAGNOSIS — T63451D Toxic effect of venom of hornets, accidental (unintentional), subsequent encounter: Secondary | ICD-10-CM | POA: Diagnosis not present

## 2016-08-05 DIAGNOSIS — T63451D Toxic effect of venom of hornets, accidental (unintentional), subsequent encounter: Secondary | ICD-10-CM | POA: Diagnosis not present

## 2016-08-05 DIAGNOSIS — T63441D Toxic effect of venom of bees, accidental (unintentional), subsequent encounter: Secondary | ICD-10-CM | POA: Diagnosis not present

## 2016-08-12 DIAGNOSIS — T63441D Toxic effect of venom of bees, accidental (unintentional), subsequent encounter: Secondary | ICD-10-CM | POA: Diagnosis not present

## 2016-08-12 DIAGNOSIS — T63451D Toxic effect of venom of hornets, accidental (unintentional), subsequent encounter: Secondary | ICD-10-CM | POA: Diagnosis not present

## 2016-08-19 DIAGNOSIS — T63441D Toxic effect of venom of bees, accidental (unintentional), subsequent encounter: Secondary | ICD-10-CM | POA: Diagnosis not present

## 2016-08-19 DIAGNOSIS — T63451D Toxic effect of venom of hornets, accidental (unintentional), subsequent encounter: Secondary | ICD-10-CM | POA: Diagnosis not present

## 2016-08-26 DIAGNOSIS — T63451D Toxic effect of venom of hornets, accidental (unintentional), subsequent encounter: Secondary | ICD-10-CM | POA: Diagnosis not present

## 2016-08-26 DIAGNOSIS — T63441D Toxic effect of venom of bees, accidental (unintentional), subsequent encounter: Secondary | ICD-10-CM | POA: Diagnosis not present

## 2016-09-02 DIAGNOSIS — T63451D Toxic effect of venom of hornets, accidental (unintentional), subsequent encounter: Secondary | ICD-10-CM | POA: Diagnosis not present

## 2016-09-02 DIAGNOSIS — T63441D Toxic effect of venom of bees, accidental (unintentional), subsequent encounter: Secondary | ICD-10-CM | POA: Diagnosis not present

## 2016-09-09 DIAGNOSIS — T63451D Toxic effect of venom of hornets, accidental (unintentional), subsequent encounter: Secondary | ICD-10-CM | POA: Diagnosis not present

## 2016-09-09 DIAGNOSIS — T63441D Toxic effect of venom of bees, accidental (unintentional), subsequent encounter: Secondary | ICD-10-CM | POA: Diagnosis not present

## 2016-09-16 DIAGNOSIS — T63451D Toxic effect of venom of hornets, accidental (unintentional), subsequent encounter: Secondary | ICD-10-CM | POA: Diagnosis not present

## 2016-09-16 DIAGNOSIS — T63441D Toxic effect of venom of bees, accidental (unintentional), subsequent encounter: Secondary | ICD-10-CM | POA: Diagnosis not present

## 2016-09-23 DIAGNOSIS — T63441D Toxic effect of venom of bees, accidental (unintentional), subsequent encounter: Secondary | ICD-10-CM | POA: Diagnosis not present

## 2016-09-23 DIAGNOSIS — T63451D Toxic effect of venom of hornets, accidental (unintentional), subsequent encounter: Secondary | ICD-10-CM | POA: Diagnosis not present

## 2016-09-29 DIAGNOSIS — T63451D Toxic effect of venom of hornets, accidental (unintentional), subsequent encounter: Secondary | ICD-10-CM | POA: Diagnosis not present

## 2016-09-29 DIAGNOSIS — T63441D Toxic effect of venom of bees, accidental (unintentional), subsequent encounter: Secondary | ICD-10-CM | POA: Diagnosis not present

## 2016-10-07 DIAGNOSIS — T63441D Toxic effect of venom of bees, accidental (unintentional), subsequent encounter: Secondary | ICD-10-CM | POA: Diagnosis not present

## 2016-10-07 DIAGNOSIS — T63451D Toxic effect of venom of hornets, accidental (unintentional), subsequent encounter: Secondary | ICD-10-CM | POA: Diagnosis not present

## 2016-10-09 ENCOUNTER — Other Ambulatory Visit (INDEPENDENT_AMBULATORY_CARE_PROVIDER_SITE_OTHER): Payer: Self-pay | Admitting: Orthopedic Surgery

## 2016-10-14 DIAGNOSIS — T63441D Toxic effect of venom of bees, accidental (unintentional), subsequent encounter: Secondary | ICD-10-CM | POA: Diagnosis not present

## 2016-10-14 DIAGNOSIS — T63451D Toxic effect of venom of hornets, accidental (unintentional), subsequent encounter: Secondary | ICD-10-CM | POA: Diagnosis not present

## 2016-10-21 DIAGNOSIS — T63451D Toxic effect of venom of hornets, accidental (unintentional), subsequent encounter: Secondary | ICD-10-CM | POA: Diagnosis not present

## 2016-10-21 DIAGNOSIS — T63441D Toxic effect of venom of bees, accidental (unintentional), subsequent encounter: Secondary | ICD-10-CM | POA: Diagnosis not present

## 2016-10-28 DIAGNOSIS — T63451D Toxic effect of venom of hornets, accidental (unintentional), subsequent encounter: Secondary | ICD-10-CM | POA: Diagnosis not present

## 2016-10-28 DIAGNOSIS — T63441D Toxic effect of venom of bees, accidental (unintentional), subsequent encounter: Secondary | ICD-10-CM | POA: Diagnosis not present

## 2016-11-04 DIAGNOSIS — T63451D Toxic effect of venom of hornets, accidental (unintentional), subsequent encounter: Secondary | ICD-10-CM | POA: Diagnosis not present

## 2016-11-04 DIAGNOSIS — T63441D Toxic effect of venom of bees, accidental (unintentional), subsequent encounter: Secondary | ICD-10-CM | POA: Diagnosis not present

## 2016-11-18 DIAGNOSIS — T63441D Toxic effect of venom of bees, accidental (unintentional), subsequent encounter: Secondary | ICD-10-CM | POA: Diagnosis not present

## 2016-11-18 DIAGNOSIS — T63451D Toxic effect of venom of hornets, accidental (unintentional), subsequent encounter: Secondary | ICD-10-CM | POA: Diagnosis not present

## 2016-12-09 DIAGNOSIS — T63441D Toxic effect of venom of bees, accidental (unintentional), subsequent encounter: Secondary | ICD-10-CM | POA: Diagnosis not present

## 2016-12-09 DIAGNOSIS — T63451D Toxic effect of venom of hornets, accidental (unintentional), subsequent encounter: Secondary | ICD-10-CM | POA: Diagnosis not present

## 2016-12-17 DIAGNOSIS — D2272 Melanocytic nevi of left lower limb, including hip: Secondary | ICD-10-CM | POA: Diagnosis not present

## 2016-12-17 DIAGNOSIS — D2271 Melanocytic nevi of right lower limb, including hip: Secondary | ICD-10-CM | POA: Diagnosis not present

## 2016-12-17 DIAGNOSIS — D692 Other nonthrombocytopenic purpura: Secondary | ICD-10-CM | POA: Diagnosis not present

## 2016-12-17 DIAGNOSIS — L720 Epidermal cyst: Secondary | ICD-10-CM | POA: Diagnosis not present

## 2016-12-17 DIAGNOSIS — D1801 Hemangioma of skin and subcutaneous tissue: Secondary | ICD-10-CM | POA: Diagnosis not present

## 2016-12-17 DIAGNOSIS — D225 Melanocytic nevi of trunk: Secondary | ICD-10-CM | POA: Diagnosis not present

## 2016-12-17 DIAGNOSIS — L570 Actinic keratosis: Secondary | ICD-10-CM | POA: Diagnosis not present

## 2016-12-17 DIAGNOSIS — D2261 Melanocytic nevi of right upper limb, including shoulder: Secondary | ICD-10-CM | POA: Diagnosis not present

## 2016-12-17 DIAGNOSIS — D485 Neoplasm of uncertain behavior of skin: Secondary | ICD-10-CM | POA: Diagnosis not present

## 2016-12-17 DIAGNOSIS — D2262 Melanocytic nevi of left upper limb, including shoulder: Secondary | ICD-10-CM | POA: Diagnosis not present

## 2016-12-17 DIAGNOSIS — L814 Other melanin hyperpigmentation: Secondary | ICD-10-CM | POA: Diagnosis not present

## 2017-01-06 DIAGNOSIS — T63441D Toxic effect of venom of bees, accidental (unintentional), subsequent encounter: Secondary | ICD-10-CM | POA: Diagnosis not present

## 2017-01-06 DIAGNOSIS — T63451D Toxic effect of venom of hornets, accidental (unintentional), subsequent encounter: Secondary | ICD-10-CM | POA: Diagnosis not present

## 2017-01-10 DIAGNOSIS — Z23 Encounter for immunization: Secondary | ICD-10-CM | POA: Diagnosis not present

## 2017-01-11 DIAGNOSIS — Z0000 Encounter for general adult medical examination without abnormal findings: Secondary | ICD-10-CM | POA: Diagnosis not present

## 2017-01-11 DIAGNOSIS — E538 Deficiency of other specified B group vitamins: Secondary | ICD-10-CM | POA: Diagnosis not present

## 2017-01-11 DIAGNOSIS — E669 Obesity, unspecified: Secondary | ICD-10-CM | POA: Diagnosis not present

## 2017-01-11 DIAGNOSIS — E7800 Pure hypercholesterolemia, unspecified: Secondary | ICD-10-CM | POA: Diagnosis not present

## 2017-01-11 DIAGNOSIS — K219 Gastro-esophageal reflux disease without esophagitis: Secondary | ICD-10-CM | POA: Diagnosis not present

## 2017-01-11 DIAGNOSIS — Z1389 Encounter for screening for other disorder: Secondary | ICD-10-CM | POA: Diagnosis not present

## 2017-01-11 DIAGNOSIS — G250 Essential tremor: Secondary | ICD-10-CM | POA: Diagnosis not present

## 2017-01-11 DIAGNOSIS — Z6831 Body mass index (BMI) 31.0-31.9, adult: Secondary | ICD-10-CM | POA: Diagnosis not present

## 2017-01-11 DIAGNOSIS — I10 Essential (primary) hypertension: Secondary | ICD-10-CM | POA: Diagnosis not present

## 2017-01-11 DIAGNOSIS — R7301 Impaired fasting glucose: Secondary | ICD-10-CM | POA: Diagnosis not present

## 2017-02-10 DIAGNOSIS — T63441D Toxic effect of venom of bees, accidental (unintentional), subsequent encounter: Secondary | ICD-10-CM | POA: Diagnosis not present

## 2017-02-10 DIAGNOSIS — T63451D Toxic effect of venom of hornets, accidental (unintentional), subsequent encounter: Secondary | ICD-10-CM | POA: Diagnosis not present

## 2017-03-10 DIAGNOSIS — T63441D Toxic effect of venom of bees, accidental (unintentional), subsequent encounter: Secondary | ICD-10-CM | POA: Diagnosis not present

## 2017-03-10 DIAGNOSIS — T63451D Toxic effect of venom of hornets, accidental (unintentional), subsequent encounter: Secondary | ICD-10-CM | POA: Diagnosis not present

## 2017-03-19 ENCOUNTER — Other Ambulatory Visit (INDEPENDENT_AMBULATORY_CARE_PROVIDER_SITE_OTHER): Payer: Self-pay | Admitting: Family

## 2017-04-14 DIAGNOSIS — T63441D Toxic effect of venom of bees, accidental (unintentional), subsequent encounter: Secondary | ICD-10-CM | POA: Diagnosis not present

## 2017-04-14 DIAGNOSIS — T63451D Toxic effect of venom of hornets, accidental (unintentional), subsequent encounter: Secondary | ICD-10-CM | POA: Diagnosis not present

## 2017-05-13 DIAGNOSIS — T63451D Toxic effect of venom of hornets, accidental (unintentional), subsequent encounter: Secondary | ICD-10-CM | POA: Diagnosis not present

## 2017-05-13 DIAGNOSIS — T63441D Toxic effect of venom of bees, accidental (unintentional), subsequent encounter: Secondary | ICD-10-CM | POA: Diagnosis not present

## 2017-06-14 DIAGNOSIS — T63451D Toxic effect of venom of hornets, accidental (unintentional), subsequent encounter: Secondary | ICD-10-CM | POA: Diagnosis not present

## 2017-06-14 DIAGNOSIS — T63441D Toxic effect of venom of bees, accidental (unintentional), subsequent encounter: Secondary | ICD-10-CM | POA: Diagnosis not present

## 2017-07-07 DIAGNOSIS — J3089 Other allergic rhinitis: Secondary | ICD-10-CM | POA: Diagnosis not present

## 2017-07-07 DIAGNOSIS — T63441D Toxic effect of venom of bees, accidental (unintentional), subsequent encounter: Secondary | ICD-10-CM | POA: Diagnosis not present

## 2017-07-07 DIAGNOSIS — T63451D Toxic effect of venom of hornets, accidental (unintentional), subsequent encounter: Secondary | ICD-10-CM | POA: Diagnosis not present

## 2017-07-21 DIAGNOSIS — I10 Essential (primary) hypertension: Secondary | ICD-10-CM | POA: Diagnosis not present

## 2017-07-21 DIAGNOSIS — K219 Gastro-esophageal reflux disease without esophagitis: Secondary | ICD-10-CM | POA: Diagnosis not present

## 2017-07-21 DIAGNOSIS — M19079 Primary osteoarthritis, unspecified ankle and foot: Secondary | ICD-10-CM | POA: Diagnosis not present

## 2017-07-21 DIAGNOSIS — J3089 Other allergic rhinitis: Secondary | ICD-10-CM | POA: Diagnosis not present

## 2017-07-21 DIAGNOSIS — M179 Osteoarthritis of knee, unspecified: Secondary | ICD-10-CM | POA: Diagnosis not present

## 2017-07-21 DIAGNOSIS — G250 Essential tremor: Secondary | ICD-10-CM | POA: Diagnosis not present

## 2017-07-27 ENCOUNTER — Other Ambulatory Visit (INDEPENDENT_AMBULATORY_CARE_PROVIDER_SITE_OTHER): Payer: Self-pay | Admitting: Family

## 2017-08-07 ENCOUNTER — Other Ambulatory Visit (INDEPENDENT_AMBULATORY_CARE_PROVIDER_SITE_OTHER): Payer: Self-pay | Admitting: Family

## 2017-08-10 ENCOUNTER — Other Ambulatory Visit (INDEPENDENT_AMBULATORY_CARE_PROVIDER_SITE_OTHER): Payer: Self-pay | Admitting: Family

## 2017-12-23 DIAGNOSIS — L853 Xerosis cutis: Secondary | ICD-10-CM | POA: Diagnosis not present

## 2017-12-23 DIAGNOSIS — L718 Other rosacea: Secondary | ICD-10-CM | POA: Diagnosis not present

## 2017-12-23 DIAGNOSIS — D485 Neoplasm of uncertain behavior of skin: Secondary | ICD-10-CM | POA: Diagnosis not present

## 2017-12-23 DIAGNOSIS — D225 Melanocytic nevi of trunk: Secondary | ICD-10-CM | POA: Diagnosis not present

## 2017-12-23 DIAGNOSIS — L821 Other seborrheic keratosis: Secondary | ICD-10-CM | POA: Diagnosis not present

## 2018-01-20 DIAGNOSIS — D126 Benign neoplasm of colon, unspecified: Secondary | ICD-10-CM | POA: Diagnosis not present

## 2018-01-20 DIAGNOSIS — Z1389 Encounter for screening for other disorder: Secondary | ICD-10-CM | POA: Diagnosis not present

## 2018-01-20 DIAGNOSIS — K219 Gastro-esophageal reflux disease without esophagitis: Secondary | ICD-10-CM | POA: Diagnosis not present

## 2018-01-20 DIAGNOSIS — Z0000 Encounter for general adult medical examination without abnormal findings: Secondary | ICD-10-CM | POA: Diagnosis not present

## 2018-01-20 DIAGNOSIS — M179 Osteoarthritis of knee, unspecified: Secondary | ICD-10-CM | POA: Diagnosis not present

## 2018-01-20 DIAGNOSIS — I10 Essential (primary) hypertension: Secondary | ICD-10-CM | POA: Diagnosis not present

## 2018-01-20 DIAGNOSIS — M7021 Olecranon bursitis, right elbow: Secondary | ICD-10-CM | POA: Diagnosis not present

## 2018-01-20 DIAGNOSIS — E7800 Pure hypercholesterolemia, unspecified: Secondary | ICD-10-CM | POA: Diagnosis not present

## 2018-01-20 DIAGNOSIS — Z23 Encounter for immunization: Secondary | ICD-10-CM | POA: Diagnosis not present

## 2018-01-20 DIAGNOSIS — Z79899 Other long term (current) drug therapy: Secondary | ICD-10-CM | POA: Diagnosis not present

## 2018-04-06 DIAGNOSIS — H401421 Capsular glaucoma with pseudoexfoliation of lens, left eye, mild stage: Secondary | ICD-10-CM | POA: Diagnosis not present

## 2018-04-06 DIAGNOSIS — H353122 Nonexudative age-related macular degeneration, left eye, intermediate dry stage: Secondary | ICD-10-CM | POA: Diagnosis not present

## 2018-04-06 DIAGNOSIS — H524 Presbyopia: Secondary | ICD-10-CM | POA: Diagnosis not present

## 2018-04-06 DIAGNOSIS — H353211 Exudative age-related macular degeneration, right eye, with active choroidal neovascularization: Secondary | ICD-10-CM | POA: Diagnosis not present

## 2018-04-18 ENCOUNTER — Encounter (INDEPENDENT_AMBULATORY_CARE_PROVIDER_SITE_OTHER): Payer: Medicare Other | Admitting: Ophthalmology

## 2018-04-18 DIAGNOSIS — H35033 Hypertensive retinopathy, bilateral: Secondary | ICD-10-CM

## 2018-04-18 DIAGNOSIS — H43813 Vitreous degeneration, bilateral: Secondary | ICD-10-CM | POA: Diagnosis not present

## 2018-04-18 DIAGNOSIS — I10 Essential (primary) hypertension: Secondary | ICD-10-CM | POA: Diagnosis not present

## 2018-04-18 DIAGNOSIS — H353122 Nonexudative age-related macular degeneration, left eye, intermediate dry stage: Secondary | ICD-10-CM | POA: Diagnosis not present

## 2018-04-18 DIAGNOSIS — H353211 Exudative age-related macular degeneration, right eye, with active choroidal neovascularization: Secondary | ICD-10-CM | POA: Diagnosis not present

## 2018-04-29 DIAGNOSIS — H401421 Capsular glaucoma with pseudoexfoliation of lens, left eye, mild stage: Secondary | ICD-10-CM | POA: Diagnosis not present

## 2018-05-16 ENCOUNTER — Encounter (INDEPENDENT_AMBULATORY_CARE_PROVIDER_SITE_OTHER): Payer: Medicare Other | Admitting: Ophthalmology

## 2018-05-16 DIAGNOSIS — H35033 Hypertensive retinopathy, bilateral: Secondary | ICD-10-CM

## 2018-05-16 DIAGNOSIS — H43813 Vitreous degeneration, bilateral: Secondary | ICD-10-CM | POA: Diagnosis not present

## 2018-05-16 DIAGNOSIS — H353211 Exudative age-related macular degeneration, right eye, with active choroidal neovascularization: Secondary | ICD-10-CM | POA: Diagnosis not present

## 2018-05-16 DIAGNOSIS — I10 Essential (primary) hypertension: Secondary | ICD-10-CM

## 2018-05-16 DIAGNOSIS — H353122 Nonexudative age-related macular degeneration, left eye, intermediate dry stage: Secondary | ICD-10-CM

## 2018-06-13 ENCOUNTER — Other Ambulatory Visit: Payer: Self-pay

## 2018-06-13 ENCOUNTER — Encounter (INDEPENDENT_AMBULATORY_CARE_PROVIDER_SITE_OTHER): Payer: Medicare Other | Admitting: Ophthalmology

## 2018-06-13 DIAGNOSIS — H35033 Hypertensive retinopathy, bilateral: Secondary | ICD-10-CM

## 2018-06-13 DIAGNOSIS — I10 Essential (primary) hypertension: Secondary | ICD-10-CM

## 2018-06-13 DIAGNOSIS — H353122 Nonexudative age-related macular degeneration, left eye, intermediate dry stage: Secondary | ICD-10-CM | POA: Diagnosis not present

## 2018-06-13 DIAGNOSIS — H353211 Exudative age-related macular degeneration, right eye, with active choroidal neovascularization: Secondary | ICD-10-CM | POA: Diagnosis not present

## 2018-06-13 DIAGNOSIS — H43813 Vitreous degeneration, bilateral: Secondary | ICD-10-CM

## 2018-07-11 ENCOUNTER — Encounter (INDEPENDENT_AMBULATORY_CARE_PROVIDER_SITE_OTHER): Payer: Medicare Other | Admitting: Ophthalmology

## 2018-07-11 ENCOUNTER — Other Ambulatory Visit: Payer: Self-pay

## 2018-07-11 DIAGNOSIS — I10 Essential (primary) hypertension: Secondary | ICD-10-CM | POA: Diagnosis not present

## 2018-07-11 DIAGNOSIS — H43813 Vitreous degeneration, bilateral: Secondary | ICD-10-CM | POA: Diagnosis not present

## 2018-07-11 DIAGNOSIS — H353122 Nonexudative age-related macular degeneration, left eye, intermediate dry stage: Secondary | ICD-10-CM | POA: Diagnosis not present

## 2018-07-11 DIAGNOSIS — H353211 Exudative age-related macular degeneration, right eye, with active choroidal neovascularization: Secondary | ICD-10-CM | POA: Diagnosis not present

## 2018-07-11 DIAGNOSIS — H35033 Hypertensive retinopathy, bilateral: Secondary | ICD-10-CM | POA: Diagnosis not present

## 2018-07-13 DIAGNOSIS — T63451D Toxic effect of venom of hornets, accidental (unintentional), subsequent encounter: Secondary | ICD-10-CM | POA: Diagnosis not present

## 2018-07-13 DIAGNOSIS — J301 Allergic rhinitis due to pollen: Secondary | ICD-10-CM | POA: Diagnosis not present

## 2018-07-13 DIAGNOSIS — T63441D Toxic effect of venom of bees, accidental (unintentional), subsequent encounter: Secondary | ICD-10-CM | POA: Diagnosis not present

## 2018-07-13 DIAGNOSIS — J3089 Other allergic rhinitis: Secondary | ICD-10-CM | POA: Diagnosis not present

## 2018-07-19 DIAGNOSIS — K219 Gastro-esophageal reflux disease without esophagitis: Secondary | ICD-10-CM | POA: Diagnosis not present

## 2018-07-19 DIAGNOSIS — I10 Essential (primary) hypertension: Secondary | ICD-10-CM | POA: Diagnosis not present

## 2018-07-22 DIAGNOSIS — J3089 Other allergic rhinitis: Secondary | ICD-10-CM | POA: Diagnosis not present

## 2018-07-22 DIAGNOSIS — T63451D Toxic effect of venom of hornets, accidental (unintentional), subsequent encounter: Secondary | ICD-10-CM | POA: Diagnosis not present

## 2018-07-22 DIAGNOSIS — T63441D Toxic effect of venom of bees, accidental (unintentional), subsequent encounter: Secondary | ICD-10-CM | POA: Diagnosis not present

## 2018-08-08 ENCOUNTER — Encounter (INDEPENDENT_AMBULATORY_CARE_PROVIDER_SITE_OTHER): Payer: Medicare Other | Admitting: Ophthalmology

## 2018-08-08 ENCOUNTER — Other Ambulatory Visit: Payer: Self-pay

## 2018-08-08 DIAGNOSIS — H43813 Vitreous degeneration, bilateral: Secondary | ICD-10-CM

## 2018-08-08 DIAGNOSIS — H353122 Nonexudative age-related macular degeneration, left eye, intermediate dry stage: Secondary | ICD-10-CM | POA: Diagnosis not present

## 2018-08-08 DIAGNOSIS — H353211 Exudative age-related macular degeneration, right eye, with active choroidal neovascularization: Secondary | ICD-10-CM | POA: Diagnosis not present

## 2018-08-08 DIAGNOSIS — H35033 Hypertensive retinopathy, bilateral: Secondary | ICD-10-CM | POA: Diagnosis not present

## 2018-08-08 DIAGNOSIS — I10 Essential (primary) hypertension: Secondary | ICD-10-CM | POA: Diagnosis not present

## 2018-09-12 ENCOUNTER — Encounter (INDEPENDENT_AMBULATORY_CARE_PROVIDER_SITE_OTHER): Payer: Medicare Other | Admitting: Ophthalmology

## 2018-09-12 ENCOUNTER — Other Ambulatory Visit: Payer: Self-pay

## 2018-09-12 DIAGNOSIS — H353122 Nonexudative age-related macular degeneration, left eye, intermediate dry stage: Secondary | ICD-10-CM

## 2018-09-12 DIAGNOSIS — H43813 Vitreous degeneration, bilateral: Secondary | ICD-10-CM | POA: Diagnosis not present

## 2018-09-12 DIAGNOSIS — I10 Essential (primary) hypertension: Secondary | ICD-10-CM

## 2018-09-12 DIAGNOSIS — H35033 Hypertensive retinopathy, bilateral: Secondary | ICD-10-CM

## 2018-09-12 DIAGNOSIS — H353211 Exudative age-related macular degeneration, right eye, with active choroidal neovascularization: Secondary | ICD-10-CM

## 2018-10-10 ENCOUNTER — Encounter (INDEPENDENT_AMBULATORY_CARE_PROVIDER_SITE_OTHER): Payer: Medicare Other | Admitting: Ophthalmology

## 2018-10-10 ENCOUNTER — Other Ambulatory Visit: Payer: Self-pay

## 2018-10-10 DIAGNOSIS — H43813 Vitreous degeneration, bilateral: Secondary | ICD-10-CM

## 2018-10-10 DIAGNOSIS — H353122 Nonexudative age-related macular degeneration, left eye, intermediate dry stage: Secondary | ICD-10-CM | POA: Diagnosis not present

## 2018-10-10 DIAGNOSIS — H353211 Exudative age-related macular degeneration, right eye, with active choroidal neovascularization: Secondary | ICD-10-CM | POA: Diagnosis not present

## 2018-10-10 DIAGNOSIS — I10 Essential (primary) hypertension: Secondary | ICD-10-CM

## 2018-10-10 DIAGNOSIS — H35033 Hypertensive retinopathy, bilateral: Secondary | ICD-10-CM | POA: Diagnosis not present

## 2018-11-04 DIAGNOSIS — H401422 Capsular glaucoma with pseudoexfoliation of lens, left eye, moderate stage: Secondary | ICD-10-CM | POA: Diagnosis not present

## 2018-11-04 DIAGNOSIS — Z961 Presence of intraocular lens: Secondary | ICD-10-CM | POA: Diagnosis not present

## 2018-11-07 ENCOUNTER — Other Ambulatory Visit: Payer: Self-pay

## 2018-11-07 ENCOUNTER — Encounter (INDEPENDENT_AMBULATORY_CARE_PROVIDER_SITE_OTHER): Payer: Medicare Other | Admitting: Ophthalmology

## 2018-11-07 DIAGNOSIS — I10 Essential (primary) hypertension: Secondary | ICD-10-CM

## 2018-11-07 DIAGNOSIS — H353122 Nonexudative age-related macular degeneration, left eye, intermediate dry stage: Secondary | ICD-10-CM

## 2018-11-07 DIAGNOSIS — H43813 Vitreous degeneration, bilateral: Secondary | ICD-10-CM

## 2018-11-07 DIAGNOSIS — H35033 Hypertensive retinopathy, bilateral: Secondary | ICD-10-CM | POA: Diagnosis not present

## 2018-11-07 DIAGNOSIS — H353211 Exudative age-related macular degeneration, right eye, with active choroidal neovascularization: Secondary | ICD-10-CM | POA: Diagnosis not present

## 2018-12-06 ENCOUNTER — Other Ambulatory Visit: Payer: Self-pay

## 2018-12-06 ENCOUNTER — Encounter (INDEPENDENT_AMBULATORY_CARE_PROVIDER_SITE_OTHER): Payer: Medicare Other | Admitting: Ophthalmology

## 2018-12-06 DIAGNOSIS — I10 Essential (primary) hypertension: Secondary | ICD-10-CM

## 2018-12-06 DIAGNOSIS — H353211 Exudative age-related macular degeneration, right eye, with active choroidal neovascularization: Secondary | ICD-10-CM | POA: Diagnosis not present

## 2018-12-06 DIAGNOSIS — H353122 Nonexudative age-related macular degeneration, left eye, intermediate dry stage: Secondary | ICD-10-CM

## 2018-12-06 DIAGNOSIS — H35033 Hypertensive retinopathy, bilateral: Secondary | ICD-10-CM | POA: Diagnosis not present

## 2018-12-06 DIAGNOSIS — H43813 Vitreous degeneration, bilateral: Secondary | ICD-10-CM | POA: Diagnosis not present

## 2018-12-07 DIAGNOSIS — H401422 Capsular glaucoma with pseudoexfoliation of lens, left eye, moderate stage: Secondary | ICD-10-CM | POA: Diagnosis not present

## 2018-12-26 DIAGNOSIS — Z23 Encounter for immunization: Secondary | ICD-10-CM | POA: Diagnosis not present

## 2018-12-29 ENCOUNTER — Encounter (HOSPITAL_COMMUNITY): Payer: Self-pay

## 2018-12-29 ENCOUNTER — Other Ambulatory Visit: Payer: Self-pay

## 2018-12-29 ENCOUNTER — Emergency Department (HOSPITAL_COMMUNITY): Payer: Medicare Other

## 2018-12-29 ENCOUNTER — Emergency Department (HOSPITAL_COMMUNITY)
Admission: EM | Admit: 2018-12-29 | Discharge: 2018-12-30 | Discharge status: Against Medical Advice | Payer: Medicare Other | Attending: Emergency Medicine | Admitting: Emergency Medicine

## 2018-12-29 DIAGNOSIS — Z5321 Procedure and treatment not carried out due to patient leaving prior to being seen by health care provider: Secondary | ICD-10-CM | POA: Insufficient documentation

## 2018-12-29 DIAGNOSIS — S0990XA Unspecified injury of head, initial encounter: Secondary | ICD-10-CM | POA: Diagnosis not present

## 2018-12-29 DIAGNOSIS — R55 Syncope and collapse: Secondary | ICD-10-CM | POA: Diagnosis present

## 2018-12-29 HISTORY — DX: Essential (primary) hypertension: I10

## 2018-12-29 HISTORY — DX: Unspecified osteoarthritis, unspecified site: M19.90

## 2018-12-29 HISTORY — DX: Hyperlipidemia, unspecified: E78.5

## 2018-12-29 LAB — CBC
HCT: 35 % — ABNORMAL LOW (ref 39.0–52.0)
Hemoglobin: 12.5 g/dL — ABNORMAL LOW (ref 13.0–17.0)
MCH: 37.8 pg — ABNORMAL HIGH (ref 26.0–34.0)
MCHC: 35.7 g/dL (ref 30.0–36.0)
MCV: 105.7 fL — ABNORMAL HIGH (ref 80.0–100.0)
Platelets: 176 10*3/uL (ref 150–400)
RBC: 3.31 MIL/uL — ABNORMAL LOW (ref 4.22–5.81)
RDW: 11.9 % (ref 11.5–15.5)
WBC: 6.9 10*3/uL (ref 4.0–10.5)
nRBC: 0 % (ref 0.0–0.2)

## 2018-12-29 LAB — BASIC METABOLIC PANEL
Anion gap: 15 (ref 5–15)
BUN: 12 mg/dL (ref 8–23)
CO2: 21 mmol/L — ABNORMAL LOW (ref 22–32)
Calcium: 8.6 mg/dL — ABNORMAL LOW (ref 8.9–10.3)
Chloride: 98 mmol/L (ref 98–111)
Creatinine, Ser: 0.84 mg/dL (ref 0.61–1.24)
GFR calc Af Amer: >60 mL/min (ref >60)
GFR calc non Af Amer: >60 mL/min (ref >60)
Glucose, Bld: 102 mg/dL — ABNORMAL HIGH (ref 70–99)
Potassium: 3.5 mmol/L (ref 3.5–5.1)
Sodium: 134 mmol/L — ABNORMAL LOW (ref 135–145)

## 2018-12-29 LAB — CBG MONITORING, ED: Glucose-Capillary: 98 mg/dL (ref 70–99)

## 2018-12-29 MED ORDER — SODIUM CHLORIDE 0.9% FLUSH: 3.0000 mL | Freq: Once | INTRAVENOUS | Status: DC

## 2018-12-29 NOTE — ED Triage Notes (Signed)
Pt accompanied by daughter who reports he had a few glasses of wine tonight when he got up and passed out, hitting his head, lac noted to right side of his forehead. Pt on a blood thinner. Pt a.o at this time, c.o slight pain to the back of his head.

## 2018-12-30 DIAGNOSIS — I10 Essential (primary) hypertension: Secondary | ICD-10-CM | POA: Diagnosis not present

## 2018-12-30 DIAGNOSIS — R42 Dizziness and giddiness: Secondary | ICD-10-CM | POA: Diagnosis not present

## 2018-12-30 DIAGNOSIS — R269 Unspecified abnormalities of gait and mobility: Secondary | ICD-10-CM | POA: Diagnosis not present

## 2018-12-30 NOTE — ED Notes (Signed)
Patient daughter states patient is insisting on going. Explained we always advise pt to stay and be seen. Patient daughter said they might come back to check in but they were tired and hungry and wanting to go.

## 2019-01-05 DIAGNOSIS — R269 Unspecified abnormalities of gait and mobility: Secondary | ICD-10-CM | POA: Diagnosis not present

## 2019-01-05 DIAGNOSIS — I10 Essential (primary) hypertension: Secondary | ICD-10-CM | POA: Diagnosis not present

## 2019-01-05 DIAGNOSIS — E291 Testicular hypofunction: Secondary | ICD-10-CM | POA: Diagnosis not present

## 2019-01-05 DIAGNOSIS — E7800 Pure hypercholesterolemia, unspecified: Secondary | ICD-10-CM | POA: Diagnosis not present

## 2019-01-05 DIAGNOSIS — R29898 Other symptoms and signs involving the musculoskeletal system: Secondary | ICD-10-CM | POA: Diagnosis not present

## 2019-01-05 DIAGNOSIS — M6281 Muscle weakness (generalized): Secondary | ICD-10-CM | POA: Diagnosis not present

## 2019-01-06 DIAGNOSIS — Z79899 Other long term (current) drug therapy: Secondary | ICD-10-CM | POA: Diagnosis not present

## 2019-01-09 ENCOUNTER — Encounter (INDEPENDENT_AMBULATORY_CARE_PROVIDER_SITE_OTHER): Payer: Medicare Other | Admitting: Ophthalmology

## 2019-01-09 ENCOUNTER — Other Ambulatory Visit: Payer: Self-pay | Admitting: Internal Medicine

## 2019-01-09 ENCOUNTER — Other Ambulatory Visit: Payer: Self-pay

## 2019-01-09 DIAGNOSIS — I10 Essential (primary) hypertension: Secondary | ICD-10-CM

## 2019-01-09 DIAGNOSIS — H353211 Exudative age-related macular degeneration, right eye, with active choroidal neovascularization: Secondary | ICD-10-CM | POA: Diagnosis not present

## 2019-01-09 DIAGNOSIS — H43813 Vitreous degeneration, bilateral: Secondary | ICD-10-CM

## 2019-01-09 DIAGNOSIS — H353122 Nonexudative age-related macular degeneration, left eye, intermediate dry stage: Secondary | ICD-10-CM

## 2019-01-09 DIAGNOSIS — R269 Unspecified abnormalities of gait and mobility: Secondary | ICD-10-CM

## 2019-01-09 DIAGNOSIS — H35033 Hypertensive retinopathy, bilateral: Secondary | ICD-10-CM

## 2019-01-16 DIAGNOSIS — D2262 Melanocytic nevi of left upper limb, including shoulder: Secondary | ICD-10-CM | POA: Diagnosis not present

## 2019-01-16 DIAGNOSIS — D1801 Hemangioma of skin and subcutaneous tissue: Secondary | ICD-10-CM | POA: Diagnosis not present

## 2019-01-16 DIAGNOSIS — R739 Hyperglycemia, unspecified: Secondary | ICD-10-CM | POA: Diagnosis not present

## 2019-01-16 DIAGNOSIS — L821 Other seborrheic keratosis: Secondary | ICD-10-CM | POA: Diagnosis not present

## 2019-01-16 DIAGNOSIS — D2272 Melanocytic nevi of left lower limb, including hip: Secondary | ICD-10-CM | POA: Diagnosis not present

## 2019-01-16 DIAGNOSIS — L570 Actinic keratosis: Secondary | ICD-10-CM | POA: Diagnosis not present

## 2019-01-16 DIAGNOSIS — D2271 Melanocytic nevi of right lower limb, including hip: Secondary | ICD-10-CM | POA: Diagnosis not present

## 2019-01-16 DIAGNOSIS — D225 Melanocytic nevi of trunk: Secondary | ICD-10-CM | POA: Diagnosis not present

## 2019-01-16 DIAGNOSIS — D2261 Melanocytic nevi of right upper limb, including shoulder: Secondary | ICD-10-CM | POA: Diagnosis not present

## 2019-01-20 ENCOUNTER — Ambulatory Visit
Admission: RE | Admit: 2019-01-20 | Discharge: 2019-01-20 | Disposition: A | Discharge status: Home / Self Care | Payer: Medicare Other | Source: Ambulatory Visit | Attending: Internal Medicine | Admitting: Internal Medicine

## 2019-01-20 ENCOUNTER — Other Ambulatory Visit: Payer: Self-pay

## 2019-01-20 DIAGNOSIS — I6782 Cerebral ischemia: Secondary | ICD-10-CM | POA: Diagnosis not present

## 2019-01-20 DIAGNOSIS — R269 Unspecified abnormalities of gait and mobility: Secondary | ICD-10-CM

## 2019-01-20 MED ORDER — GADOBENATE DIMEGLUMINE 529 MG/ML IV SOLN
20.0000 mL | Freq: Once | INTRAVENOUS | Status: AC | PRN
  Administered 2019-01-20: 20 mL via INTRAVENOUS

## 2019-01-23 DIAGNOSIS — G250 Essential tremor: Secondary | ICD-10-CM | POA: Diagnosis not present

## 2019-01-23 DIAGNOSIS — I10 Essential (primary) hypertension: Secondary | ICD-10-CM | POA: Diagnosis not present

## 2019-01-23 DIAGNOSIS — K219 Gastro-esophageal reflux disease without esophagitis: Secondary | ICD-10-CM | POA: Diagnosis not present

## 2019-01-23 DIAGNOSIS — Z0000 Encounter for general adult medical examination without abnormal findings: Secondary | ICD-10-CM | POA: Diagnosis not present

## 2019-01-23 DIAGNOSIS — Z1389 Encounter for screening for other disorder: Secondary | ICD-10-CM | POA: Diagnosis not present

## 2019-01-23 DIAGNOSIS — E7800 Pure hypercholesterolemia, unspecified: Secondary | ICD-10-CM | POA: Diagnosis not present

## 2019-01-23 DIAGNOSIS — R269 Unspecified abnormalities of gait and mobility: Secondary | ICD-10-CM | POA: Diagnosis not present

## 2019-01-23 DIAGNOSIS — Z23 Encounter for immunization: Secondary | ICD-10-CM | POA: Diagnosis not present

## 2019-02-13 ENCOUNTER — Encounter (INDEPENDENT_AMBULATORY_CARE_PROVIDER_SITE_OTHER): Payer: Medicare Other | Admitting: Ophthalmology

## 2019-02-13 DIAGNOSIS — H353122 Nonexudative age-related macular degeneration, left eye, intermediate dry stage: Secondary | ICD-10-CM | POA: Diagnosis not present

## 2019-02-13 DIAGNOSIS — H43813 Vitreous degeneration, bilateral: Secondary | ICD-10-CM

## 2019-02-13 DIAGNOSIS — I10 Essential (primary) hypertension: Secondary | ICD-10-CM

## 2019-02-13 DIAGNOSIS — H353211 Exudative age-related macular degeneration, right eye, with active choroidal neovascularization: Secondary | ICD-10-CM

## 2019-02-13 DIAGNOSIS — H35033 Hypertensive retinopathy, bilateral: Secondary | ICD-10-CM

## 2019-02-20 DIAGNOSIS — E7800 Pure hypercholesterolemia, unspecified: Secondary | ICD-10-CM | POA: Diagnosis not present

## 2019-02-20 DIAGNOSIS — M179 Osteoarthritis of knee, unspecified: Secondary | ICD-10-CM | POA: Diagnosis not present

## 2019-02-20 DIAGNOSIS — I10 Essential (primary) hypertension: Secondary | ICD-10-CM | POA: Diagnosis not present

## 2019-02-20 DIAGNOSIS — M19079 Primary osteoarthritis, unspecified ankle and foot: Secondary | ICD-10-CM | POA: Diagnosis not present

## 2019-03-03 NOTE — Progress Notes (Signed)
Randy Wood was seen today in the movement disorders clinic for neurologic consultation at the request of Lavone Orn, MD.  The consultation is for the evaluation of weakness and balance trouble.  Patient has a long history of essential tremor, for which he is on propranolol and that seems to help.  Over the last several years, however, he has developed weakness and balance trouble that has especially affected him when walking upstairs.  Primary care indicates that his degenerative joint disease in his knees has complicated the issues.  Pt states that he has "extreme acrophobia" with stairs and thinks that is why he may fall on stairs.  Primary care felt that there was cogwheeling on examination and wanted to rule out myopathy or any other neurologic issue.  Patient stopped his statin, but that did not seem to help his ambulation per records, but pt states that it did some.  Tremor: Yes.     How long has it been going on? Long hx of ET x 10+ years  At rest or with activation?  activation  Fam hx of tremor?  No.  Located where?  Bilateral UE - been on propranolol for long time (only one time per day - 20 mg) and no tremor any longer.  Brother is a physician in [place] and he dx the ET and originially prescribed the propranolol   Other Specific Symptoms:  Voice: no change now - had raspy prior to tx for GERD Sleep: sleeps well  Vivid Dreams:  No.  Acting out dreams:  No. Wet Pillows: No. Postural symptoms:  Yes.  , has trouble up and down out of the chair (gets lightheaded/dizzy when first gets up)  Falls?  Yes.  ,  Bradykinesia symptoms: shuffling gait (states that it is because R knee pain and needs the knee injections again and he has been dragging the leg) Loss of smell:  No. Loss of taste:  No. Urinary Incontinence:  No. Difficulty Swallowing:  No. Handwriting, micrographia: No. but does have a more difficult time writing than in the past and has arthritis in the hands Trouble  with ADL's:  No.  Trouble buttoning clothing: No. Depression:  Yes.  , A little b/c of isolation - daughter with breast Ca, wife died of GBM Memory changes:  No., some issues with names Hallucinations:  No.  visual distortions: Yes.   N/V:  No. Lightheaded:  No.  Syncope: Yes.   on December 30, 2018 presented to primary care about a syncopal episode.  I reviewed those records as well as the ER records the night of the event.  The patient stood up suddenly and suddenly fell and lost consciousness and fell back and hit his head.  The patient had been drinking (1 glass wine).  The patient did not stay in the emergency room long enough to be evaluated.  His primary care physician did discontinue his amlodipine at that visit.  He was continued on his losartan/hydrochlorothiazide.  Pt states that he has since restarted his amlodipine and stopped the losartan/hctz. Diplopia:  No. Dyskinesia:  No.  Neuroimaging of the brain has  previously been performed.  It is available for my review today.  MRI of the brain was completed on January 20, 2019.  I personally reviewed this.  There was mild atrophy and mild small vessel disease.  ALLERGIES:  No Known Allergies  CURRENT MEDICATIONS:  Current Outpatient Medications  Medication Instructions  . amLODipine (NORVASC) 10 mg, Oral, Daily  .  brimonidine (ALPHAGAN) 0.2 % ophthalmic solution No dose, route, or frequency recorded.  . dorzolamide-timolol (COSOPT) 22.3-6.8 MG/ML ophthalmic solution No dose, route, or frequency recorded.  Marland Kitchen EPINEPHrine 0.3 mg/0.3 mL IJ SOAJ injection epinephrine 0.3 mg/0.3 mL injection, auto-injector  . meloxicam (MOBIC) 7.5 MG tablet TAKE ONE TABLET BY MOUTH TWICE A DAY AS NEEDED WITH FOOD AS NEEDED FOR INFLAMMATION/PAIN  . propranolol (INDERAL) 20 MG tablet propranolol 20 mg tablet    PAST MEDICAL HISTORY:   Past Medical History:  Diagnosis Date  . Arthritis   . Hyperlipemia   . Hypertension     PAST SURGICAL HISTORY:    Past Surgical History:  Procedure Laterality Date  . ANKLE SURGERY      SOCIAL HISTORY:   Social History   Socioeconomic History  . Marital status: Widowed    Spouse name: Not on file  . Number of children: 2  . Years of education: Not on file  . Highest education level: Bachelor's degree (e.g., BA, AB, BS)  Occupational History  . Occupation: retired  Scientific laboratory technician  . Financial resource strain: Not on file  . Food insecurity    Worry: Not on file    Inability: Not on file  . Transportation needs    Medical: Not on file    Non-medical: Not on file  Tobacco Use  . Smoking status: Never Smoker  . Smokeless tobacco: Never Used  Substance and Sexual Activity  . Alcohol use: Yes    Alcohol/week: 3.0 standard drinks    Types: 3 Glasses of wine per week    Comment: daily- 3-4 drinks  . Drug use: Never  . Sexual activity: Not on file  Lifestyle  . Physical activity    Days per week: Not on file    Minutes per session: Not on file  . Stress: Not on file  Relationships  . Social [name] on phone: Not on file    Gets together: Not on file    Attends religious service: Not on file    Active member of club or organization: Not on file    Attends meetings of clubs or organizations: Not on file    Relationship status: Not on file  . Intimate partner violence    Fear of current or ex partner: Not on file    Emotionally abused: Not on file    Physically abused: Not on file    Forced sexual activity: Not on file  Other Topics Concern  . Not on file  Social History Narrative   Widowed   2 daughter   Right handed   Retired   Drinks coffee 1 cup a day, NO Tea NO soda    FAMILY HISTORY:   Family Status  Relation Name Status  . Mother  Deceased  . Father  Deceased  . Brother  Alive  . Daughter x2 Alive    ROS:  Review of Systems  Constitutional: Negative.   HENT: Negative.   Eyes: Negative.   Cardiovascular: Negative.   Gastrointestinal: Negative.    Genitourinary: Positive for urgency.  Musculoskeletal: Positive for joint pain (R knee).  Skin: Negative.   Endo/Heme/Allergies: Negative.     PHYSICAL EXAMINATION:    VITALS:   Vitals:   03/06/19 0954  BP: (!) 142/74  Pulse: 82  SpO2: 98%  Weight: 237 lb (107.5 kg)  Height: 5\' 11"  (1.803 m)    GEN:  The patient appears stated age and is in NAD. HEENT:  Normocephalic, atraumatic.  The mucous membranes are moist. The superficial temporal arteries are without ropiness or tenderness. CV:  RRR Lungs:  CTAB Neck/HEME:  There are no carotid bruits bilaterally.  Neurological examination:  Orientation: The patient is alert and oriented x3. Fund of knowledge is appropriate.  Recent and remote memory are intact.  Attention and concentration are normal.    Able to name objects and repeat phrases. Cranial nerves: There is good facial symmetry.  Extraocular muscles are intact. The visual fields are full to confrontational testing. The speech is fluent and clear. Soft palate rises symmetrically and there is no tongue deviation. Hearing is intact to conversational tone. Sensation: Sensation is intact to light  throughout (facial, trunk, extremities). Vibration is absent at the bilateral big toe and ankle and intact at the knee and hand (overall decreased distally). There is no extinction with double simultaneous stimulation. There is no sensory dermatomal level identified. Motor: Strength is 5/5 in the bilateral upper and lower extremities.   Shoulder shrug is equal and symmetric.  There is no pronator drift. Deep tendon reflexes: Deep tendon reflexes are 1/4 at the bilateral biceps, triceps, brachioradialis, and absent at the bilateral patella and achilles. Plantar responses are downgoing bilaterally.  Movement examination: Tone: There is normal tone in the bilateral upper extremities.  The tone in the lower extremities is normal.  Abnormal movements: there is mild rest tremor bilaterally.   There is postural tremor, R more than L.  Has tremor with Archimedes spirals bilaterally. Coordination:  There is mild decremation with RAM's, with finger taps bilaterally.  Other RAMs are good bilaterally Gait and Station: The patient has no difficulty arising out of a deep-seated chair without the use of the hands. The patient's stride length is decreased (not shuffling), wide based and the R leg is just slightly slower than the L.    Lab Results  Component Value Date   WBC 6.9 12/29/2018   HGB 12.5 (L) 12/29/2018   HCT 35.0 (L) 12/29/2018   MCV 105.7 (H) 12/29/2018   PLT 176 12/29/2018     Chemistry      Component Value Date/Time   NA 134 (L) 12/29/2018 2032   K 3.5 12/29/2018 2032   CL 98 12/29/2018 2032   CO2 21 (L) 12/29/2018 2032   BUN 12 12/29/2018 2032   CREATININE 0.84 12/29/2018 2032      Component Value Date/Time   CALCIUM 8.6 (L) 12/29/2018 2032       ASSESSMENT/PLAN:  1.  Essential tremor, longstanding.  -He does have a mild rest component to the tremor.  He is on propranolol and is happy with that.  He did have a recent syncopal episode and his blood pressure medications have been changed around since that time.  It sounds like he gets a little presyncopal when he first stands up.  His propranolol is really not at a large dose (20 mg daily) and blood pressure was elevated today, but if additional medication is needed for tremor, we should consider primidone of her propranolol because of this.  -Discussed with the patient that I did not see evidence of Parkinson's disease today.  I agree that he is slow, but he does not meet [place] brain bank criteria for the diagnosis of Parkinson's disease.  Because of slowness and mild rest tremor (which can happen with longstanding essential tremor), I will keep an eye on him and follow-up with him in the future.  2.  Peripheral neuropathy  -The patient  has clinical examination evidence of a diffuse peripheral neuropathy, which  certainly can affect gait and balance.  We discussed safety associated with peripheral neuropathy.  We discussed balance therapy and the importance of ambulatory assistive device for balance assistance.  We discussed the use of a cane.  -We will do labs to r/o reversible causes of peripheral neuropathy.  He will have B12, folate, RPR, SPEP/UPEP with immunofixation, hemoglobin A1c (glucose just slightly elevated last time)  -We did discuss the relationship of alcohol to peripheral neuropathy.  I recommended that this be decreased to no more than 2 days/week.  -We will refer to physical therapy.  -He is considering going back to [place] for private gym training/exercise.  -Discussed EMG and declined for now.  3.  Degenerative arthritis, right knee  -Patient is going to get back to orthopedics for an injection to see if that helps.  4.  I will plan on seeing him back in the next 6 to 8 months, but encouraged him to call me should he have any questions or concerns in the meantime.   Cc:  Lavone Orn, MD

## 2019-03-06 ENCOUNTER — Encounter: Payer: Self-pay | Admitting: Neurology

## 2019-03-06 ENCOUNTER — Other Ambulatory Visit: Payer: Self-pay

## 2019-03-06 ENCOUNTER — Other Ambulatory Visit (INDEPENDENT_AMBULATORY_CARE_PROVIDER_SITE_OTHER): Payer: Medicare Other

## 2019-03-06 ENCOUNTER — Ambulatory Visit (INDEPENDENT_AMBULATORY_CARE_PROVIDER_SITE_OTHER): Payer: Medicare Other | Admitting: Neurology

## 2019-03-06 VITALS — BP 142/74 | HR 82 | Ht 71.0 in | Wt 237.0 lb

## 2019-03-06 DIAGNOSIS — R739 Hyperglycemia, unspecified: Secondary | ICD-10-CM

## 2019-03-06 DIAGNOSIS — R6889 Other general symptoms and signs: Secondary | ICD-10-CM

## 2019-03-06 DIAGNOSIS — G609 Hereditary and idiopathic neuropathy, unspecified: Secondary | ICD-10-CM

## 2019-03-06 DIAGNOSIS — R5383 Other fatigue: Secondary | ICD-10-CM | POA: Diagnosis not present

## 2019-03-06 DIAGNOSIS — G250 Essential tremor: Secondary | ICD-10-CM

## 2019-03-06 DIAGNOSIS — M1711 Unilateral primary osteoarthritis, right knee: Secondary | ICD-10-CM

## 2019-03-06 LAB — HEMOGLOBIN A1C: Hgb A1c MFr Bld: 5 % (ref 4.6–6.5)

## 2019-03-06 LAB — B12 AND FOLATE PANEL
Folate: 10.9 ng/mL (ref >5.9)
Vitamin B-12: 383 pg/mL (ref 211–911)

## 2019-03-06 NOTE — Patient Instructions (Addendum)
You have been referred to [facility] for therapy. They will call you directly to schedule an appointment.  Please call [phone] if you do not hear from them.   Your provider has requested that you have labwork completed today. Please go to [facility] ([id number]) on the second floor of this building before leaving the office today. You do not need to check in. If you are not called within 15 minutes please check with the front desk.   

## 2019-03-07 ENCOUNTER — Other Ambulatory Visit: Payer: Medicare Other

## 2019-03-09 ENCOUNTER — Ambulatory Visit (INDEPENDENT_AMBULATORY_CARE_PROVIDER_SITE_OTHER): Payer: Medicare Other

## 2019-03-09 ENCOUNTER — Ambulatory Visit (INDEPENDENT_AMBULATORY_CARE_PROVIDER_SITE_OTHER): Payer: Medicare Other | Admitting: Physician Assistant

## 2019-03-09 ENCOUNTER — Other Ambulatory Visit: Payer: Self-pay

## 2019-03-09 ENCOUNTER — Encounter: Payer: Self-pay | Admitting: Physician Assistant

## 2019-03-09 ENCOUNTER — Ambulatory Visit: Payer: Self-pay

## 2019-03-09 VITALS — Ht 71.0 in | Wt 237.0 lb

## 2019-03-09 DIAGNOSIS — M25561 Pain in right knee: Secondary | ICD-10-CM

## 2019-03-09 DIAGNOSIS — M25562 Pain in left knee: Secondary | ICD-10-CM | POA: Diagnosis not present

## 2019-03-09 DIAGNOSIS — M19071 Primary osteoarthritis, right ankle and foot: Secondary | ICD-10-CM | POA: Diagnosis not present

## 2019-03-09 DIAGNOSIS — G8929 Other chronic pain: Secondary | ICD-10-CM | POA: Diagnosis not present

## 2019-03-09 DIAGNOSIS — M19079 Primary osteoarthritis, unspecified ankle and foot: Secondary | ICD-10-CM

## 2019-03-09 LAB — IMMUNOFIXATION ELECTROPHORESIS
IgG (Immunoglobin G), Serum: 953 mg/dL (ref 600–1540)
IgM, Serum: 26 mg/dL — ABNORMAL LOW (ref 50–300)
Immunofix Electr Int: NOT DETECTED
Immunoglobulin A: 344 mg/dL — ABNORMAL HIGH (ref 70–320)

## 2019-03-09 LAB — PROTEIN ELECTROPHORESIS,RANDOM URN
Albumin: 29 %
Alpha-1-Globulin, U: 6 %
Alpha-2-Globulin, U: 17 %
Beta Globulin, U: 25 %
Creatinine, Urine: 209 mg/dL (ref 20–320)
Gamma Globulin, U: 24 %
Protein/Creat Ratio: 191 mg/g creat — ABNORMAL HIGH (ref 22–128)
Protein/Creatinine Ratio: 0.191 mg/mg creat — ABNORMAL HIGH (ref 0.022–0.12)
Total Protein, Urine: 40 mg/dL — ABNORMAL HIGH (ref 5–25)

## 2019-03-09 LAB — PROTEIN ELECTROPHORESIS, SERUM
Albumin ELP: 3.8 g/dL (ref 3.8–4.8)
Alpha 1: 0.3 g/dL (ref 0.2–0.3)
Alpha 2: 0.8 g/dL (ref 0.5–0.9)
Beta 2: 0.4 g/dL (ref 0.2–0.5)
Beta Globulin: 0.4 g/dL (ref 0.4–0.6)
Gamma Globulin: 0.9 g/dL (ref 0.8–1.7)
Total Protein: 6.6 g/dL (ref 6.1–8.1)

## 2019-03-09 LAB — RPR: RPR Ser Ql: NONREACTIVE

## 2019-03-09 LAB — IMMUNOFIXATION INTE

## 2019-03-09 LAB — IMMUNOFIXATION, URINE

## 2019-03-09 MED ORDER — METHYLPREDNISOLONE ACETATE 40 MG/ML IJ SUSP
40.0000 mg | INTRAMUSCULAR | Status: AC | PRN
  Administered 2019-03-09: 40 mg via INTRA_ARTICULAR

## 2019-03-09 MED ORDER — LIDOCAINE HCL 1 % IJ SOLN
5.0000 mL | INTRAMUSCULAR | Status: AC | PRN
  Administered 2019-03-09: 15:00:00 5 mL

## 2019-03-09 MED ORDER — LIDOCAINE HCL 1 % IJ SOLN
2.0000 mL | INTRAMUSCULAR | Status: AC | PRN
  Administered 2019-03-09: 2 mL

## 2019-03-09 NOTE — Progress Notes (Signed)
Office Visit Note   Patient: Randy Wood           Date of Birth: 1940-08-04           MRN: [id number] Visit Date: 03/09/2019              Requested by: Lavone Orn, MD [address] [place],  [place] [address] PCP: Lavone Orn, MD  Chief Complaint  Patient presents with  . Right Knee - Pain, New Patient (Initial Visit)      HPI: This is a pleasant gentleman with a history of right knee arthritis and ORIF of a lateral malleolus fracture on the right.  With regards to his right knee he last had a gel injection a few years ago and it significantly helped him he has been caring for his wife and was unable to follow-up with regards to his ankle he thinks he has had an injection in the past which also helped  Assessment & Plan: Visit Diagnoses:  1. Chronic pain of right knee   2. Ankle arthritis     Plan: We will get authorization for a gel injection on the knee. We will inject his right ankle today.Marland Kitchen Ultimately if he had knee pain or ankle pain that he could not tolerate and was not managed by injections for the knee a knee replacement would be certainly reasonable and he would be an excellent candidate for an ankle fusion   Follow-Up Instructions: No follow-ups on file.   Ortho Exam  Patient is alert, oriented, no adenopathy, well-dressed, normal affect, normal respiratory effort. Right knee: varus alignment. Mild soft tissue swelling. No cellulitis.  Right ankle: Moderate amount of soft tissue swelling but no cellulitis distal pulses are intact very limited range of motion Imaging: No results found. No images are attached to the encounter.  Labs: Lab Results  Component Value Date   HGBA1C 5.0 03/06/2019     No results found for: ALBUMIN, PREALBUMIN, LABURIC  No results found for: MG No results found for: VD25OH  No results found for: PREALBUMIN CBC EXTENDED Latest Ref Rng & Units 12/29/2018  WBC 4.0 - 10.5 K/uL 6.9  RBC 4.22 - 5.81 MIL/uL  3.31(L)  HGB 13.0 - 17.0 g/dL 12.5(L)  HCT 39.0 - 52.0 % 35.0(L)  PLT 150 - 400 K/uL 176     Body mass index is 33.05 kg/m.  Orders:  Orders Placed This Encounter  Procedures  . XR Knee 1-2 Views Right  . XR Ankle Complete Right   No orders of the defined types were placed in this encounter.    Procedures: Large Joint Inj: R knee on 03/09/2019 3:28 PM Indications: pain and diagnostic evaluation Details: 22 G 1.5 in needle  Arthrogram: No  Medications: 40 mg methylPREDNISolone acetate 40 MG/ML; 5 mL lidocaine 1 % Outcome: tolerated well, no immediate complications Procedure, treatment alternatives, risks and benefits explained, specific risks discussed. Consent was given by the patient. Immediately prior to procedure a time out was called to verify the correct patient, procedure, equipment, support staff and site/side marked as required. Patient was prepped and draped in the usual sterile fashion.   Medium Joint Inj: R ankle on 03/09/2019 3:30 PM Indications: pain and diagnostic evaluation Details: 22 G 1.5 in needle Medications: 2 mL lidocaine 1 %; 40 mg methylPREDNISolone acetate 40 MG/ML Outcome: tolerated well, no immediate complications Procedure, treatment alternatives, risks and benefits explained, specific risks discussed. Consent was given by the patient. Immediately prior  to procedure a time out was called to verify the correct patient, procedure, equipment, support staff and site/side marked as required. Patient was prepped and draped in the usual sterile fashion.      Clinical Data: No additional findings.  ROS:  All other systems negative, except as noted in the HPI. Review of Systems  Objective: Vital Signs: Ht 5\' 11"  (1.803 m)   Wt 237 lb (107.5 kg)   BMI 33.05 kg/m   Specialty Comments:  No specialty comments available.  PMFS History: There are no problems to display for this patient.  Past Medical History:  Diagnosis Date  . Arthritis   .  Hyperlipemia   . Hypertension   . Macular degeneration, wet ([place])     Family History  Problem Relation Age of Onset  . Colon cancer Mother 86  . Ovarian cancer Mother   . Prostate cancer Father   . Healthy Brother   . Breast cancer Daughter        x1 oldest daughter  . Hashimoto's thyroiditis Daughter        youngest daughter    Past Surgical History:  Procedure Laterality Date  . ANKLE SURGERY    . CATARACT EXTRACTION, BILATERAL    . TONSILLECTOMY     Social History   Occupational History  . Occupation: retired  Tobacco Use  . Smoking status: Former Smoker    Quit date: 1974    Years since quitting: 46.9  . Smokeless tobacco: Never Used  Substance and Sexual Activity  . Alcohol use: Yes    Alcohol/week: 3.0 standard drinks    Types: 3 Glasses of wine per week    Comment: 2-3 drinks/night  . Drug use: Never  . Sexual activity: Not on file

## 2019-03-14 ENCOUNTER — Telehealth: Payer: Self-pay

## 2019-03-14 NOTE — Telephone Encounter (Signed)
Submitted VOB for SynviscOne, right knee. 

## 2019-03-20 ENCOUNTER — Telehealth: Payer: Self-pay

## 2019-03-20 ENCOUNTER — Encounter (INDEPENDENT_AMBULATORY_CARE_PROVIDER_SITE_OTHER): Payer: Medicare Other | Admitting: Ophthalmology

## 2019-03-20 DIAGNOSIS — H35033 Hypertensive retinopathy, bilateral: Secondary | ICD-10-CM | POA: Diagnosis not present

## 2019-03-20 DIAGNOSIS — I10 Essential (primary) hypertension: Secondary | ICD-10-CM

## 2019-03-20 DIAGNOSIS — H353211 Exudative age-related macular degeneration, right eye, with active choroidal neovascularization: Secondary | ICD-10-CM | POA: Diagnosis not present

## 2019-03-20 DIAGNOSIS — H353122 Nonexudative age-related macular degeneration, left eye, intermediate dry stage: Secondary | ICD-10-CM | POA: Diagnosis not present

## 2019-03-20 DIAGNOSIS — H43813 Vitreous degeneration, bilateral: Secondary | ICD-10-CM

## 2019-03-20 NOTE — Telephone Encounter (Signed)
Patient is aware that he is approved for gel injection.  Approved for SynviscOne, right knee. Buy & Bill Covered at 100% through secondary insurance Nurse, mental health) after [employer] pays. No Co-pay No PA required  Appt. 03/22/2019 with Bevely Palmer Persons

## 2019-03-22 ENCOUNTER — Ambulatory Visit (INDEPENDENT_AMBULATORY_CARE_PROVIDER_SITE_OTHER): Payer: Medicare Other | Admitting: Physician Assistant

## 2019-03-22 ENCOUNTER — Encounter: Payer: Self-pay | Admitting: Physician Assistant

## 2019-03-22 ENCOUNTER — Other Ambulatory Visit: Payer: Self-pay

## 2019-03-22 VITALS — Ht 71.0 in | Wt 237.0 lb

## 2019-03-22 DIAGNOSIS — G8929 Other chronic pain: Secondary | ICD-10-CM

## 2019-03-22 DIAGNOSIS — M1711 Unilateral primary osteoarthritis, right knee: Secondary | ICD-10-CM | POA: Diagnosis not present

## 2019-03-22 MED ORDER — HYLAN G-F 20 48 MG/6ML IX SOSY
48.0000 mg | PREFILLED_SYRINGE | INTRA_ARTICULAR | Status: AC | PRN
  Administered 2019-03-22: 48 mg via INTRA_ARTICULAR

## 2019-03-22 NOTE — Progress Notes (Signed)
Office Visit Note   Patient: Randy Wood           Date of Birth: 12/15/1940           MRN: [id number] Visit Date: 03/22/2019              Requested by: Lavone Orn, MD [address] [place],  [place] [address] PCP: Lavone Orn, MD  Chief Complaint  Patient presents with  . Right Knee - Pain    Synvisc one buy and bill       HPI: Patient presents today for Synvisc 1 injection into his right knee.  Assessment & Plan: Visit Diagnoses: No diagnosis found.  Plan: He may follow-up as needed  Follow-Up Instructions: No follow-ups on file.   Ortho Exam  Patient is alert, oriented, no adenopathy, well-dressed, normal affect, normal respiratory effort. Right knee: No effusion no swelling.  Imaging: No results found. No images are attached to the encounter.  Labs: Lab Results  Component Value Date   HGBA1C 5.0 03/06/2019     No results found for: ALBUMIN, PREALBUMIN, LABURIC  No results found for: MG No results found for: VD25OH  No results found for: PREALBUMIN CBC EXTENDED Latest Ref Rng & Units 12/29/2018  WBC 4.0 - 10.5 K/uL 6.9  RBC 4.22 - 5.81 MIL/uL 3.31(L)  HGB 13.0 - 17.0 g/dL 12.5(L)  HCT 39.0 - 52.0 % 35.0(L)  PLT 150 - 400 K/uL 176     Body mass index is 33.05 kg/m.  Orders:  No orders of the defined types were placed in this encounter.  No orders of the defined types were placed in this encounter.    Procedures: Large Joint Inj on 03/22/2019 12:45 PM Indications: pain and diagnostic evaluation Details: 18 G 1.5 in needle, anteromedial approach  Arthrogram: No  Medications: 48 mg Hylan 48 MG/6ML Outcome: tolerated well, no immediate complications Procedure, treatment alternatives, risks and benefits explained, specific risks discussed. Consent was given by the patient. Immediately prior to procedure a time out was called to verify the correct patient, procedure, equipment, support staff and site/side marked as  required. Patient was prepped and draped in the usual sterile fashion.      Clinical Data: No additional findings.  ROS:  All other systems negative, except as noted in the HPI. Review of Systems  Objective: Vital Signs: Ht 5\' 11"  (1.803 m)   Wt 237 lb (107.5 kg)   BMI 33.05 kg/m   Specialty Comments:  No specialty comments available.  PMFS History: There are no problems to display for this patient.  Past Medical History:  Diagnosis Date  . Arthritis   . Hyperlipemia   . Hypertension   . Macular degeneration, wet ([place])     Family History  Problem Relation Age of Onset  . Colon cancer Mother 37  . Ovarian cancer Mother   . Prostate cancer Father   . Healthy Brother   . Breast cancer Daughter        x1 oldest daughter  . Hashimoto's thyroiditis Daughter        youngest daughter    Past Surgical History:  Procedure Laterality Date  . ANKLE SURGERY    . CATARACT EXTRACTION, BILATERAL    . TONSILLECTOMY     Social History   Occupational History  . Occupation: retired  Tobacco Use  . Smoking status: Former Smoker    Quit date: 1974    Years since quitting: 47.0  . Smokeless  tobacco: Never Used  Substance and Sexual Activity  . Alcohol use: Yes    Alcohol/week: 3.0 standard drinks    Types: 3 Glasses of wine per week    Comment: 2-3 drinks/night  . Drug use: Never  . Sexual activity: Not on file

## 2019-04-24 ENCOUNTER — Encounter (INDEPENDENT_AMBULATORY_CARE_PROVIDER_SITE_OTHER): Payer: Medicare Other | Admitting: Ophthalmology

## 2019-04-24 DIAGNOSIS — H43813 Vitreous degeneration, bilateral: Secondary | ICD-10-CM

## 2019-04-24 DIAGNOSIS — I10 Essential (primary) hypertension: Secondary | ICD-10-CM

## 2019-04-24 DIAGNOSIS — H35033 Hypertensive retinopathy, bilateral: Secondary | ICD-10-CM | POA: Diagnosis not present

## 2019-04-24 DIAGNOSIS — H353211 Exudative age-related macular degeneration, right eye, with active choroidal neovascularization: Secondary | ICD-10-CM

## 2019-04-24 DIAGNOSIS — H353122 Nonexudative age-related macular degeneration, left eye, intermediate dry stage: Secondary | ICD-10-CM

## 2019-05-29 ENCOUNTER — Encounter (INDEPENDENT_AMBULATORY_CARE_PROVIDER_SITE_OTHER): Payer: Medicare Other | Admitting: Ophthalmology

## 2019-05-29 ENCOUNTER — Other Ambulatory Visit: Payer: Self-pay

## 2019-05-29 DIAGNOSIS — H353122 Nonexudative age-related macular degeneration, left eye, intermediate dry stage: Secondary | ICD-10-CM | POA: Diagnosis not present

## 2019-05-29 DIAGNOSIS — H35033 Hypertensive retinopathy, bilateral: Secondary | ICD-10-CM

## 2019-05-29 DIAGNOSIS — H353211 Exudative age-related macular degeneration, right eye, with active choroidal neovascularization: Secondary | ICD-10-CM | POA: Diagnosis not present

## 2019-05-29 DIAGNOSIS — I10 Essential (primary) hypertension: Secondary | ICD-10-CM | POA: Diagnosis not present

## 2019-05-29 DIAGNOSIS — H43813 Vitreous degeneration, bilateral: Secondary | ICD-10-CM | POA: Diagnosis not present

## 2019-06-06 DIAGNOSIS — H353122 Nonexudative age-related macular degeneration, left eye, intermediate dry stage: Secondary | ICD-10-CM | POA: Diagnosis not present

## 2019-06-06 DIAGNOSIS — H26492 Other secondary cataract, left eye: Secondary | ICD-10-CM | POA: Diagnosis not present

## 2019-06-06 DIAGNOSIS — H353211 Exudative age-related macular degeneration, right eye, with active choroidal neovascularization: Secondary | ICD-10-CM | POA: Diagnosis not present

## 2019-06-06 DIAGNOSIS — H401421 Capsular glaucoma with pseudoexfoliation of lens, left eye, mild stage: Secondary | ICD-10-CM | POA: Diagnosis not present

## 2019-06-28 DIAGNOSIS — M179 Osteoarthritis of knee, unspecified: Secondary | ICD-10-CM | POA: Diagnosis not present

## 2019-06-28 DIAGNOSIS — E7800 Pure hypercholesterolemia, unspecified: Secondary | ICD-10-CM | POA: Diagnosis not present

## 2019-06-28 DIAGNOSIS — M19079 Primary osteoarthritis, unspecified ankle and foot: Secondary | ICD-10-CM | POA: Diagnosis not present

## 2019-06-28 DIAGNOSIS — I10 Essential (primary) hypertension: Secondary | ICD-10-CM | POA: Diagnosis not present

## 2019-07-03 ENCOUNTER — Encounter (INDEPENDENT_AMBULATORY_CARE_PROVIDER_SITE_OTHER): Payer: Medicare Other | Admitting: Ophthalmology

## 2019-07-03 DIAGNOSIS — H353122 Nonexudative age-related macular degeneration, left eye, intermediate dry stage: Secondary | ICD-10-CM | POA: Diagnosis not present

## 2019-07-03 DIAGNOSIS — I10 Essential (primary) hypertension: Secondary | ICD-10-CM

## 2019-07-03 DIAGNOSIS — H353211 Exudative age-related macular degeneration, right eye, with active choroidal neovascularization: Secondary | ICD-10-CM | POA: Diagnosis not present

## 2019-07-03 DIAGNOSIS — H35033 Hypertensive retinopathy, bilateral: Secondary | ICD-10-CM

## 2019-07-03 DIAGNOSIS — H43813 Vitreous degeneration, bilateral: Secondary | ICD-10-CM | POA: Diagnosis not present

## 2019-07-19 DIAGNOSIS — H401421 Capsular glaucoma with pseudoexfoliation of lens, left eye, mild stage: Secondary | ICD-10-CM | POA: Diagnosis not present

## 2019-07-25 DIAGNOSIS — I10 Essential (primary) hypertension: Secondary | ICD-10-CM | POA: Diagnosis not present

## 2019-07-25 DIAGNOSIS — K219 Gastro-esophageal reflux disease without esophagitis: Secondary | ICD-10-CM | POA: Diagnosis not present

## 2019-07-25 DIAGNOSIS — Z23 Encounter for immunization: Secondary | ICD-10-CM | POA: Diagnosis not present

## 2019-07-26 DIAGNOSIS — T63441D Toxic effect of venom of bees, accidental (unintentional), subsequent encounter: Secondary | ICD-10-CM | POA: Diagnosis not present

## 2019-07-26 DIAGNOSIS — T63451D Toxic effect of venom of hornets, accidental (unintentional), subsequent encounter: Secondary | ICD-10-CM | POA: Diagnosis not present

## 2019-07-26 DIAGNOSIS — J301 Allergic rhinitis due to pollen: Secondary | ICD-10-CM | POA: Diagnosis not present

## 2019-07-26 DIAGNOSIS — J3089 Other allergic rhinitis: Secondary | ICD-10-CM | POA: Diagnosis not present

## 2019-08-07 ENCOUNTER — Other Ambulatory Visit: Payer: Self-pay

## 2019-08-07 ENCOUNTER — Encounter (INDEPENDENT_AMBULATORY_CARE_PROVIDER_SITE_OTHER): Payer: Medicare Other | Admitting: Ophthalmology

## 2019-08-07 DIAGNOSIS — H353122 Nonexudative age-related macular degeneration, left eye, intermediate dry stage: Secondary | ICD-10-CM

## 2019-08-07 DIAGNOSIS — I10 Essential (primary) hypertension: Secondary | ICD-10-CM

## 2019-08-07 DIAGNOSIS — H353211 Exudative age-related macular degeneration, right eye, with active choroidal neovascularization: Secondary | ICD-10-CM

## 2019-08-07 DIAGNOSIS — H35033 Hypertensive retinopathy, bilateral: Secondary | ICD-10-CM | POA: Diagnosis not present

## 2019-08-07 DIAGNOSIS — H43813 Vitreous degeneration, bilateral: Secondary | ICD-10-CM

## 2019-09-05 NOTE — Progress Notes (Deleted)
Assessment/Plan:    1.  Essential Tremor, longstanding  ***Patient has a mild rest component tremor, which can happen with longstanding essential tremor.  Does not meet criteria for Parkinson's disease, but because of slowness and mild rest tremor, we have been watching him.  This has been stable.  -Patient came to me on propranolol, and I have not changed that.  If he needs additional medicine, primidone is going to be more optimal, as he has had a syncopal episode within the last year and a history of near syncope when he first stands up.  Blood pressure medicines have been changed around since his syncopal episode.  2.  Peripheral neuropathy with mild B12 deficiency  -Start B12, 1000 mcg daily.  -Limit alcohol/stop alcohol  Subjective:   Randy Wood was seen today in follow up for essential tremor.  My previous records were reviewed prior to todays visit. Pt denies falls.  Pt denies lightheadedness, near syncope.  No hallucinations.  Mood has been good.  We did check lab work last visit because of his peripheral neuropathy.  B12 is just slightly low.    Current prescribed movement disorder medications: ***Propranolol, 20 mg daily (not prescribed by me)   PREVIOUS MEDICATIONS: {Parkinson's RX:18200}  ALLERGIES:  No Known Allergies  CURRENT MEDICATIONS:  Outpatient Encounter Medications as of 09/07/2019  Medication Sig   amLODipine (NORVASC) 10 MG tablet Take 10 mg by mouth daily.   brimonidine (ALPHAGAN) 0.2 % ophthalmic solution    dorzolamide-timolol (COSOPT) 22.3-6.8 MG/ML ophthalmic solution    EPINEPHrine 0.3 mg/0.3 mL IJ SOAJ injection epinephrine 0.3 mg/0.3 mL injection, auto-injector   meloxicam (MOBIC) 7.5 MG tablet TAKE ONE TABLET BY MOUTH TWICE A DAY AS NEEDED WITH FOOD AS NEEDED FOR INFLAMMATION/PAIN   propranolol (INDERAL) 20 MG tablet propranolol 20 mg tablet   No facility-administered encounter medications on file as of 09/07/2019.     Objective:     PHYSICAL EXAMINATION:    VITALS:  There were no vitals filed for this visit.  GEN:  The patient appears stated age and is in NAD. HEENT:  Normocephalic, atraumatic.  The mucous membranes are moist. The superficial temporal arteries are without ropiness or tenderness. CV:  RRR Lungs:  CTAB Neck/HEME:  There are no carotid bruits bilaterally.  Neurological examination:  Orientation: The patient is alert and oriented x3. Cranial nerves: There is good facial symmetry. The speech is fluent and clear. Soft palate rises symmetrically and there is no tongue deviation. Hearing is intact to conversational tone. Sensation: Sensation is intact to light touch throughout Motor: Strength is at least antigravity x4.  Movement examination: Tone: There is normal tone in the UE/LE Abnormal movements: *** Coordination:  There is *** decremation with RAM's, *** Gait and Station: The patient has *** difficulty arising out of a deep-seated chair without the use of the hands. The patient's stride length is good I have reviewed and interpreted the following labs independently   Chemistry      Component Value Date/Time   NA 134 (L) 12/29/2018 2032   K 3.5 12/29/2018 2032   CL 98 12/29/2018 2032   CO2 21 (L) 12/29/2018 2032   BUN 12 12/29/2018 2032   CREATININE 0.84 12/29/2018 2032      Component Value Date/Time   CALCIUM 8.6 (L) 12/29/2018 2032      Lab Results  Component Value Date   WBC 6.9 12/29/2018   HGB 12.5 (L) 12/29/2018   HCT 35.0 (L) 12/29/2018  MCV 105.7 (H) 12/29/2018   PLT 176 12/29/2018   No results found for: TSH   Chemistry      Component Value Date/Time   NA 134 (L) 12/29/2018 2032   K 3.5 12/29/2018 2032   CL 98 12/29/2018 2032   CO2 21 (L) 12/29/2018 2032   BUN 12 12/29/2018 2032   CREATININE 0.84 12/29/2018 2032      Component Value Date/Time   CALCIUM 8.6 (L) 12/29/2018 2032         Total time spent on today's visit was ***30 minutes, including both  face-to-face time and nonface-to-face time.  Time included that spent on review of records (prior notes available to me/labs/imaging if pertinent), discussing treatment and goals, answering patient's questions and coordinating care.  Cc:  Lavone Orn, MD

## 2019-09-07 ENCOUNTER — Ambulatory Visit: Payer: BLUE CROSS/BLUE SHIELD | Admitting: Neurology

## 2019-09-18 ENCOUNTER — Encounter (INDEPENDENT_AMBULATORY_CARE_PROVIDER_SITE_OTHER): Payer: Medicare Other | Admitting: Ophthalmology

## 2019-09-18 ENCOUNTER — Other Ambulatory Visit: Payer: Self-pay

## 2019-09-18 DIAGNOSIS — H353211 Exudative age-related macular degeneration, right eye, with active choroidal neovascularization: Secondary | ICD-10-CM

## 2019-09-18 DIAGNOSIS — H353112 Nonexudative age-related macular degeneration, right eye, intermediate dry stage: Secondary | ICD-10-CM

## 2019-09-18 DIAGNOSIS — H35033 Hypertensive retinopathy, bilateral: Secondary | ICD-10-CM

## 2019-09-18 DIAGNOSIS — H43813 Vitreous degeneration, bilateral: Secondary | ICD-10-CM

## 2019-09-18 DIAGNOSIS — I10 Essential (primary) hypertension: Secondary | ICD-10-CM | POA: Diagnosis not present

## 2019-10-23 ENCOUNTER — Encounter (INDEPENDENT_AMBULATORY_CARE_PROVIDER_SITE_OTHER): Payer: Medicare Other | Admitting: Ophthalmology

## 2019-10-23 ENCOUNTER — Other Ambulatory Visit: Payer: Self-pay

## 2019-10-23 DIAGNOSIS — H43813 Vitreous degeneration, bilateral: Secondary | ICD-10-CM | POA: Diagnosis not present

## 2019-10-23 DIAGNOSIS — H35033 Hypertensive retinopathy, bilateral: Secondary | ICD-10-CM | POA: Diagnosis not present

## 2019-10-23 DIAGNOSIS — H353211 Exudative age-related macular degeneration, right eye, with active choroidal neovascularization: Secondary | ICD-10-CM | POA: Diagnosis not present

## 2019-10-23 DIAGNOSIS — I10 Essential (primary) hypertension: Secondary | ICD-10-CM

## 2019-10-23 DIAGNOSIS — H353122 Nonexudative age-related macular degeneration, left eye, intermediate dry stage: Secondary | ICD-10-CM

## 2019-11-20 DIAGNOSIS — M19079 Primary osteoarthritis, unspecified ankle and foot: Secondary | ICD-10-CM | POA: Diagnosis not present

## 2019-11-20 DIAGNOSIS — I10 Essential (primary) hypertension: Secondary | ICD-10-CM | POA: Diagnosis not present

## 2019-11-20 DIAGNOSIS — E7800 Pure hypercholesterolemia, unspecified: Secondary | ICD-10-CM | POA: Diagnosis not present

## 2019-11-20 DIAGNOSIS — M179 Osteoarthritis of knee, unspecified: Secondary | ICD-10-CM | POA: Diagnosis not present

## 2019-11-27 ENCOUNTER — Encounter (INDEPENDENT_AMBULATORY_CARE_PROVIDER_SITE_OTHER): Payer: Medicare Other | Admitting: Ophthalmology

## 2019-11-27 ENCOUNTER — Other Ambulatory Visit: Payer: Self-pay

## 2019-11-27 DIAGNOSIS — H353122 Nonexudative age-related macular degeneration, left eye, intermediate dry stage: Secondary | ICD-10-CM | POA: Diagnosis not present

## 2019-11-27 DIAGNOSIS — H35033 Hypertensive retinopathy, bilateral: Secondary | ICD-10-CM

## 2019-11-27 DIAGNOSIS — H353211 Exudative age-related macular degeneration, right eye, with active choroidal neovascularization: Secondary | ICD-10-CM

## 2019-11-27 DIAGNOSIS — I10 Essential (primary) hypertension: Secondary | ICD-10-CM | POA: Diagnosis not present

## 2019-11-27 DIAGNOSIS — H43813 Vitreous degeneration, bilateral: Secondary | ICD-10-CM | POA: Diagnosis not present

## 2019-12-27 DIAGNOSIS — Z23 Encounter for immunization: Secondary | ICD-10-CM | POA: Diagnosis not present

## 2020-01-01 ENCOUNTER — Encounter (INDEPENDENT_AMBULATORY_CARE_PROVIDER_SITE_OTHER): Payer: Medicare Other | Admitting: Ophthalmology

## 2020-01-01 ENCOUNTER — Other Ambulatory Visit: Payer: Self-pay

## 2020-01-01 DIAGNOSIS — H35033 Hypertensive retinopathy, bilateral: Secondary | ICD-10-CM

## 2020-01-01 DIAGNOSIS — H43813 Vitreous degeneration, bilateral: Secondary | ICD-10-CM

## 2020-01-01 DIAGNOSIS — I10 Essential (primary) hypertension: Secondary | ICD-10-CM

## 2020-01-01 DIAGNOSIS — H353211 Exudative age-related macular degeneration, right eye, with active choroidal neovascularization: Secondary | ICD-10-CM | POA: Diagnosis not present

## 2020-01-01 DIAGNOSIS — H353122 Nonexudative age-related macular degeneration, left eye, intermediate dry stage: Secondary | ICD-10-CM

## 2020-01-22 DIAGNOSIS — L430 Hypertrophic lichen planus: Secondary | ICD-10-CM | POA: Diagnosis not present

## 2020-01-22 DIAGNOSIS — D2272 Melanocytic nevi of left lower limb, including hip: Secondary | ICD-10-CM | POA: Diagnosis not present

## 2020-01-22 DIAGNOSIS — M179 Osteoarthritis of knee, unspecified: Secondary | ICD-10-CM | POA: Diagnosis not present

## 2020-01-22 DIAGNOSIS — D2261 Melanocytic nevi of right upper limb, including shoulder: Secondary | ICD-10-CM | POA: Diagnosis not present

## 2020-01-22 DIAGNOSIS — D1801 Hemangioma of skin and subcutaneous tissue: Secondary | ICD-10-CM | POA: Diagnosis not present

## 2020-01-22 DIAGNOSIS — D2262 Melanocytic nevi of left upper limb, including shoulder: Secondary | ICD-10-CM | POA: Diagnosis not present

## 2020-01-22 DIAGNOSIS — D485 Neoplasm of uncertain behavior of skin: Secondary | ICD-10-CM | POA: Diagnosis not present

## 2020-01-22 DIAGNOSIS — E7800 Pure hypercholesterolemia, unspecified: Secondary | ICD-10-CM | POA: Diagnosis not present

## 2020-01-22 DIAGNOSIS — I10 Essential (primary) hypertension: Secondary | ICD-10-CM | POA: Diagnosis not present

## 2020-01-22 DIAGNOSIS — M19079 Primary osteoarthritis, unspecified ankle and foot: Secondary | ICD-10-CM | POA: Diagnosis not present

## 2020-01-22 DIAGNOSIS — D225 Melanocytic nevi of trunk: Secondary | ICD-10-CM | POA: Diagnosis not present

## 2020-01-22 DIAGNOSIS — D692 Other nonthrombocytopenic purpura: Secondary | ICD-10-CM | POA: Diagnosis not present

## 2020-01-22 DIAGNOSIS — L821 Other seborrheic keratosis: Secondary | ICD-10-CM | POA: Diagnosis not present

## 2020-01-22 DIAGNOSIS — L814 Other melanin hyperpigmentation: Secondary | ICD-10-CM | POA: Diagnosis not present

## 2020-01-22 DIAGNOSIS — D2271 Melanocytic nevi of right lower limb, including hip: Secondary | ICD-10-CM | POA: Diagnosis not present

## 2020-01-31 ENCOUNTER — Ambulatory Visit: Payer: Self-pay

## 2020-01-31 ENCOUNTER — Telehealth: Payer: Self-pay

## 2020-01-31 ENCOUNTER — Encounter: Payer: Self-pay | Admitting: Physician Assistant

## 2020-01-31 ENCOUNTER — Ambulatory Visit (INDEPENDENT_AMBULATORY_CARE_PROVIDER_SITE_OTHER): Payer: Medicare Other | Admitting: Physician Assistant

## 2020-01-31 VITALS — Ht 71.0 in | Wt 237.0 lb

## 2020-01-31 DIAGNOSIS — M25562 Pain in left knee: Secondary | ICD-10-CM | POA: Diagnosis not present

## 2020-01-31 DIAGNOSIS — M25572 Pain in left ankle and joints of left foot: Secondary | ICD-10-CM

## 2020-01-31 DIAGNOSIS — M25571 Pain in right ankle and joints of right foot: Secondary | ICD-10-CM | POA: Diagnosis not present

## 2020-01-31 DIAGNOSIS — G8929 Other chronic pain: Secondary | ICD-10-CM

## 2020-01-31 MED ORDER — METHYLPREDNISOLONE ACETATE 40 MG/ML IJ SUSP
40.0000 mg | INTRAMUSCULAR | Status: AC | PRN
  Administered 2020-01-31: 40 mg via INTRA_ARTICULAR

## 2020-01-31 MED ORDER — LIDOCAINE HCL 1 % IJ SOLN
3.0000 mL | INTRAMUSCULAR | Status: AC | PRN
  Administered 2020-01-31: 3 mL

## 2020-01-31 NOTE — Telephone Encounter (Signed)
Pt would like gel injection for right knee. Had synvisc one 02/2019 seen in office today.

## 2020-01-31 NOTE — Progress Notes (Signed)
Office Visit Note   Patient: Randy Wood           Date of Birth: 03-10-1941           MRN: [id number] Visit Date: 01/31/2020              Requested by: Lavone Orn, MD [address] [place],  [place] [address] PCP: Lavone Orn, MD  Chief Complaint  Patient presents with  . Right Knee - Pain    S/p synvic injection 02/2019  . Left Knee - Pain  . Right Ankle - Pain    S/p cortisone injection 02/2019  . Left Ankle - Pain      HPI: Patient presents today wanting to discuss his weakness and pain in his bilateral knees and bilateral ankles. He has a history of right knee arthritis and right ankle traumatic arthritis. He has had steroid injection into the ankle and done quite well and a viscosupplementation into the right knee. He also got long-term relief from this. He states he has difficulty climbing stairs just because of lower extremity weakness. He was going to a senior exercise program prior to Covid. He also is inquiring about ankle fusion and knee replacement  Assessment & Plan: Visit Diagnoses:  1. Pain in left ankle and joints of left foot   2. Chronic pain of left knee     Plan: I would like for him to get back to an exercise program for lower extremity strengthening for the next month. We will also authorize for a viscosupplementation into his right knee. I will go forward and give him bilateral ankle injections. In a month he will follow-up with Dr. Sharol Given to discuss possible surgical intervention. I think from his conversation today his right knee is likely his biggest source of concern  Follow-Up Instructions: No follow-ups on file.   Ortho Exam  Patient is alert, oriented, no adenopathy, well-dressed, normal affect, normal respiratory effort. Right knee: Mild soft tissue swelling no cellulitis he does have crepitus with range of motion. No effusion. Left knee good range of motion no crepitus no specific tenderness over the medial or lateral joint  line. No effusion no cellulitis Left greater than right ankle swelling. He has limited range of motion especially of the right ankle. He does have crepitus in the site as well. No cellulitis.  Imaging: No results found. No images are attached to the encounter.  Labs: Lab Results  Component Value Date   HGBA1C 5.0 03/06/2019     No results found for: ALBUMIN, PREALBUMIN, LABURIC  No results found for: MG No results found for: VD25OH  No results found for: PREALBUMIN CBC EXTENDED Latest Ref Rng & Units 12/29/2018  WBC 4.0 - 10.5 K/uL 6.9  RBC 4.22 - 5.81 MIL/uL 3.31(L)  HGB 13.0 - 17.0 g/dL 12.5(L)  HCT 39 - 52 % 35.0(L)  PLT 150 - 400 K/uL 176     Body mass index is 33.05 kg/m.  Orders:  Orders Placed This Encounter  Procedures  . XR Knee 1-2 Views Left  . XR Ankle 2 Views Left   No orders of the defined types were placed in this encounter.    Procedures: Medium Joint Inj: bilateral ankle on 01/31/2020 4:10 PM Indications: pain and diagnostic evaluation Details: 22 G 1.5 in needle Medications (Right): 3 mL lidocaine 1 %; 40 mg methylPREDNISolone acetate 40 MG/ML Medications (Left): 3 mL lidocaine 1 %; 40 mg methylPREDNISolone acetate 40 MG/ML Outcome: tolerated  well, no immediate complications Procedure, treatment alternatives, risks and benefits explained, specific risks discussed. Consent was given by the patient.      Clinical Data: No additional findings.  ROS:  All other systems negative, except as noted in the HPI. Review of Systems  Objective: Vital Signs: Ht 5\' 11"  (1.803 m)   Wt 237 lb (107.5 kg)   BMI 33.05 kg/m   Specialty Comments:  No specialty comments available.  PMFS History: There are no problems to display for this patient.  Past Medical History:  Diagnosis Date  . Arthritis   . Hyperlipemia   . Hypertension   . Macular degeneration, wet ([place])     Family History  Problem Relation Age of Onset  . Colon cancer Mother 90    . Ovarian cancer Mother   . Prostate cancer Father   . Healthy Brother   . Breast cancer Daughter        x1 oldest daughter  . Hashimoto's thyroiditis Daughter        youngest daughter    Past Surgical History:  Procedure Laterality Date  . ANKLE SURGERY    . CATARACT EXTRACTION, BILATERAL    . TONSILLECTOMY     Social History   Occupational History  . Occupation: retired  Tobacco Use  . Smoking status: Former Smoker    Quit date: 1974    Years since quitting: 47.8  . Smokeless tobacco: Never Used  Vaping Use  . Vaping Use: Never used  Substance and Sexual Activity  . Alcohol use: Yes    Alcohol/week: 3.0 standard drinks    Types: 3 Glasses of wine per week    Comment: 2-3 drinks/night  . Drug use: Never  . Sexual activity: Not on file

## 2020-02-02 NOTE — Telephone Encounter (Signed)
Noted  

## 2020-02-05 ENCOUNTER — Encounter (INDEPENDENT_AMBULATORY_CARE_PROVIDER_SITE_OTHER): Payer: Medicare Other | Admitting: Ophthalmology

## 2020-02-05 ENCOUNTER — Other Ambulatory Visit: Payer: Self-pay

## 2020-02-05 DIAGNOSIS — H43813 Vitreous degeneration, bilateral: Secondary | ICD-10-CM | POA: Diagnosis not present

## 2020-02-05 DIAGNOSIS — H353122 Nonexudative age-related macular degeneration, left eye, intermediate dry stage: Secondary | ICD-10-CM

## 2020-02-05 DIAGNOSIS — H353211 Exudative age-related macular degeneration, right eye, with active choroidal neovascularization: Secondary | ICD-10-CM | POA: Diagnosis not present

## 2020-02-05 DIAGNOSIS — H35033 Hypertensive retinopathy, bilateral: Secondary | ICD-10-CM | POA: Diagnosis not present

## 2020-02-05 DIAGNOSIS — I10 Essential (primary) hypertension: Secondary | ICD-10-CM | POA: Diagnosis not present

## 2020-02-29 ENCOUNTER — Telehealth: Payer: Self-pay

## 2020-02-29 NOTE — Telephone Encounter (Signed)
Submitted VOB, SynviscOne, right knee. 

## 2020-03-04 ENCOUNTER — Telehealth: Payer: Self-pay

## 2020-03-04 NOTE — Telephone Encounter (Signed)
Approved, SynviscOne, right knee. Buy & Bill Medicare deductible has been met Covered at 100% through secondary insurance ( BCBS) after Medicare pays No Co-pay No PA required  Appt. 03/14/2020 with Dr. Duda 

## 2020-03-05 ENCOUNTER — Ambulatory Visit: Payer: BLUE CROSS/BLUE SHIELD | Admitting: Orthopedic Surgery

## 2020-03-05 DIAGNOSIS — H401422 Capsular glaucoma with pseudoexfoliation of lens, left eye, moderate stage: Secondary | ICD-10-CM | POA: Diagnosis not present

## 2020-03-05 DIAGNOSIS — H52203 Unspecified astigmatism, bilateral: Secondary | ICD-10-CM | POA: Diagnosis not present

## 2020-03-05 DIAGNOSIS — Z961 Presence of intraocular lens: Secondary | ICD-10-CM | POA: Diagnosis not present

## 2020-03-05 DIAGNOSIS — H26492 Other secondary cataract, left eye: Secondary | ICD-10-CM | POA: Diagnosis not present

## 2020-03-11 ENCOUNTER — Encounter (INDEPENDENT_AMBULATORY_CARE_PROVIDER_SITE_OTHER): Payer: Medicare Other | Admitting: Ophthalmology

## 2020-03-11 ENCOUNTER — Other Ambulatory Visit: Payer: Self-pay

## 2020-03-11 DIAGNOSIS — I10 Essential (primary) hypertension: Secondary | ICD-10-CM

## 2020-03-11 DIAGNOSIS — H353211 Exudative age-related macular degeneration, right eye, with active choroidal neovascularization: Secondary | ICD-10-CM | POA: Diagnosis not present

## 2020-03-11 DIAGNOSIS — H43813 Vitreous degeneration, bilateral: Secondary | ICD-10-CM | POA: Diagnosis not present

## 2020-03-11 DIAGNOSIS — H35033 Hypertensive retinopathy, bilateral: Secondary | ICD-10-CM | POA: Diagnosis not present

## 2020-03-11 DIAGNOSIS — H353122 Nonexudative age-related macular degeneration, left eye, intermediate dry stage: Secondary | ICD-10-CM | POA: Diagnosis not present

## 2020-03-14 ENCOUNTER — Ambulatory Visit (INDEPENDENT_AMBULATORY_CARE_PROVIDER_SITE_OTHER): Payer: Medicare Other | Admitting: Physician Assistant

## 2020-03-14 ENCOUNTER — Encounter: Payer: Self-pay | Admitting: Orthopedic Surgery

## 2020-03-14 VITALS — Ht 71.0 in | Wt 237.0 lb

## 2020-03-14 DIAGNOSIS — M25561 Pain in right knee: Secondary | ICD-10-CM

## 2020-03-14 DIAGNOSIS — G8929 Other chronic pain: Secondary | ICD-10-CM

## 2020-03-14 MED ORDER — LIDOCAINE HCL 1 % IJ SOLN
5.0000 mL | INTRAMUSCULAR | Status: AC | PRN
  Administered 2020-03-14: 5 mL

## 2020-03-14 MED ORDER — HYLAN G-F 20 48 MG/6ML IX SOSY
48.0000 mg | PREFILLED_SYRINGE | INTRA_ARTICULAR | Status: AC | PRN
  Administered 2020-03-14: 48 mg via INTRA_ARTICULAR

## 2020-03-14 NOTE — Progress Notes (Signed)
Office Visit Note   Patient: Randy Wood           Date of Birth: [birth year]           MRN: [id number] Visit Date: 03/14/2020              Requested by: Lavone Orn, MD [address] [place],  [place] [address] PCP: Lavone Orn, MD  Chief Complaint  Patient presents with  . Right Knee - Follow-up    Right knee but and bill Synvisc one injection last injection was 1 year ago.       HPI: This is a pleasant 79 year old gentleman who comes in for a right Synvisc 1 injection.  He has had this in the past and does very well.  Company by his daughter  Assessment & Plan: Visit Diagnoses: No diagnosis found.  Plan: Injection was completed without difficulty.  Patient may follow-up as needed.  I spoke with his daughter and thought this was an excellent way to continue with the treatment of his right knee arthritis.  We also discussed straight leg raises to improve his quadricep function  Follow-Up Instructions: No follow-ups on file.   Ortho Exam  Patient is alert, oriented, no adenopathy, well-dressed, normal affect, normal respiratory effort. Right knee: No effusion no cellulitis has crepitus with range of motion.  Imaging: No results found. No images are attached to the encounter.  Labs: Lab Results  Component Value Date   HGBA1C 5.0 03/06/2019     No results found for: ALBUMIN, PREALBUMIN, LABURIC  No results found for: MG No results found for: VD25OH  No results found for: PREALBUMIN CBC EXTENDED Latest Ref Rng & Units 12/29/2018  WBC 4.0 - 10.5 K/uL 6.9  RBC 4.22 - 5.81 MIL/uL 3.31(L)  HGB 13.0 - 17.0 g/dL 12.5(L)  HCT 39.0 - 52.0 % 35.0(L)  PLT 150 - 400 K/uL 176     Body mass index is 33.05 kg/m.  Orders:  No orders of the defined types were placed in this encounter.  No orders of the defined types were placed in this encounter.    Procedures: Large Joint Inj on 03/14/2020 11:23 AM Indications: pain and diagnostic  evaluation Details: 18 G 1.5 in needle, anteromedial approach  Arthrogram: No  Medications: 5 mL lidocaine 1 %; 48 mg Hylan 48 MG/6ML Outcome: tolerated well, no immediate complications Procedure, treatment alternatives, risks and benefits explained, specific risks discussed. Consent was given by the patient. Immediately prior to procedure a time out was called to verify the correct patient, procedure, equipment, support staff and site/side marked as required. Patient was prepped and draped in the usual sterile fashion.      Clinical Data: No additional findings.  ROS:  All other systems negative, except as noted in the HPI. Review of Systems  Objective: Vital Signs: Ht 5\' 11"  (1.803 m)   Wt 237 lb (107.5 kg)   BMI 33.05 kg/m   Specialty Comments:  No specialty comments available.  PMFS History: There are no problems to display for this patient.  Past Medical History:  Diagnosis Date  . Arthritis   . Hyperlipemia   . Hypertension   . Macular degeneration, wet ([place])     Family History  Problem Relation Age of Onset  . Colon cancer Mother 75  . Ovarian cancer Mother   . Prostate cancer Father   . Healthy Brother   . Breast cancer Daughter  x1 oldest daughter  . Hashimoto's thyroiditis Daughter        youngest daughter    Past Surgical History:  Procedure Laterality Date  . ANKLE SURGERY    . CATARACT EXTRACTION, BILATERAL    . TONSILLECTOMY     Social History   Occupational History  . Occupation: retired  Tobacco Use  . Smoking status: Former Smoker    Quit date: 1974    Years since quitting: 47.9  . Smokeless tobacco: Never Used  Vaping Use  . Vaping Use: Never used  Substance and Sexual Activity  . Alcohol use: Yes    Alcohol/week: 3.0 standard drinks    Types: 3 Glasses of wine per week    Comment: 2-3 drinks/night  . Drug use: Never  . Sexual activity: Not on file

## 2020-03-20 ENCOUNTER — Encounter: Payer: Self-pay | Admitting: Family Medicine

## 2020-03-20 ENCOUNTER — Ambulatory Visit (INDEPENDENT_AMBULATORY_CARE_PROVIDER_SITE_OTHER): Payer: Medicare Other | Admitting: Family Medicine

## 2020-03-20 ENCOUNTER — Other Ambulatory Visit: Payer: Self-pay

## 2020-03-20 VITALS — BP 127/66 | HR 74 | Ht 71.0 in | Wt 224.6 lb

## 2020-03-20 DIAGNOSIS — L111 Transient acantholytic dermatosis [Grover]: Secondary | ICD-10-CM | POA: Insufficient documentation

## 2020-03-20 DIAGNOSIS — K589 Irritable bowel syndrome without diarrhea: Secondary | ICD-10-CM | POA: Diagnosis not present

## 2020-03-20 DIAGNOSIS — J300 Vasomotor rhinitis: Secondary | ICD-10-CM | POA: Insufficient documentation

## 2020-03-20 DIAGNOSIS — K219 Gastro-esophageal reflux disease without esophagitis: Secondary | ICD-10-CM | POA: Diagnosis not present

## 2020-03-20 DIAGNOSIS — G250 Essential tremor: Secondary | ICD-10-CM | POA: Diagnosis not present

## 2020-03-20 DIAGNOSIS — M19079 Primary osteoarthritis, unspecified ankle and foot: Secondary | ICD-10-CM | POA: Insufficient documentation

## 2020-03-20 DIAGNOSIS — E7800 Pure hypercholesterolemia, unspecified: Secondary | ICD-10-CM

## 2020-03-20 DIAGNOSIS — I10 Essential (primary) hypertension: Secondary | ICD-10-CM | POA: Diagnosis not present

## 2020-03-20 DIAGNOSIS — R269 Unspecified abnormalities of gait and mobility: Secondary | ICD-10-CM | POA: Insufficient documentation

## 2020-03-20 DIAGNOSIS — E291 Testicular hypofunction: Secondary | ICD-10-CM

## 2020-03-20 DIAGNOSIS — J3089 Other allergic rhinitis: Secondary | ICD-10-CM | POA: Insufficient documentation

## 2020-03-20 DIAGNOSIS — Z7689 Persons encountering health services in other specified circumstances: Secondary | ICD-10-CM

## 2020-03-20 DIAGNOSIS — M179 Osteoarthritis of knee, unspecified: Secondary | ICD-10-CM | POA: Insufficient documentation

## 2020-03-20 DIAGNOSIS — Z6831 Body mass index (BMI) 31.0-31.9, adult: Secondary | ICD-10-CM | POA: Insufficient documentation

## 2020-03-20 DIAGNOSIS — M1710 Unilateral primary osteoarthritis, unspecified knee: Secondary | ICD-10-CM | POA: Insufficient documentation

## 2020-03-20 DIAGNOSIS — D126 Benign neoplasm of colon, unspecified: Secondary | ICD-10-CM | POA: Insufficient documentation

## 2020-03-20 DIAGNOSIS — K222 Esophageal obstruction: Secondary | ICD-10-CM | POA: Insufficient documentation

## 2020-03-20 DIAGNOSIS — N529 Male erectile dysfunction, unspecified: Secondary | ICD-10-CM | POA: Insufficient documentation

## 2020-03-20 DIAGNOSIS — L719 Rosacea, unspecified: Secondary | ICD-10-CM | POA: Insufficient documentation

## 2020-03-20 MED ORDER — AMLODIPINE BESYLATE 10 MG PO TABS: 10.0000 mg | ORAL_TABLET | Freq: Every day | ORAL | 3 refills | Status: DC

## 2020-03-20 MED ORDER — PROPRANOLOL HCL 20 MG PO TABS: 20.0000 mg | ORAL_TABLET | Freq: Every day | ORAL | 3 refills | Status: DC

## 2020-03-20 NOTE — Progress Notes (Signed)
Office Visit Note   Patient: Randy Wood           Date of Birth: April 07, 1940           MRN: [id number] Visit Date: 03/20/2020 Requested by: Lavone Orn, MD [address] [place],  [place] [address] PCP: Eunice Blase, MD  Subjective: Chief Complaint  Patient presents with  . establish primary care    HPI: He is here to establish care.  I saw him in 2013 for orthopedic issues.  Lately he has been struggling with osteoarthritis in his knees.  He does well with viscosupplementation.  He just had injections recently.  Pain is mostly on the medial aspect of his knees.  He has hypertension which has been well controlled with amlodipine and propranolol.  Blood pressures are normal when he checks them at home.  He has chronic GERD which manifests as hoarseness.  He takes a proton pump inhibitor over-the-counter.  Last year he fell and had a CT scan of his head followed by an MRI scan.  Both these showed a mucous retention cyst in his right maxillary sinus.  He does have some sinus congestion at times, has not had any infections.  He brought in a few records from [facility].  His wife died a couple years ago but his daughters, one in [place] and one in [place], visit him frequently.                 ROS:   All other systems were reviewed and are negative.  Objective: Vital Signs: BP 127/66   Pulse 74   Ht 5\' 11"  (1.803 m)   Wt 224 lb 9.6 oz (101.9 kg)   BMI 31.33 kg/m   Physical Exam:  General:  Alert and oriented, in no acute distress. Pulm:  Breathing unlabored. Psy:  Normal mood, congruent affect.  HEENT:  No tenderness over sinuses.  No lymphadenopathy, no carotid bruits. CV: Regular rate and rhythm without murmurs, rubs, or gallops.  No peripheral edema.  2+ radial and posterior tibial pulses. Lungs: Clear to auscultation throughout with no wheezing or areas of consolidation. Knees: No effusion today.  He has bilateral pseudolaxity with  valgus stress.    Imaging: No results found.  Assessment & Plan: 1.  Hypertension, well controlled. -Refill medications.  Follow-up in 6 months at which time we will do labs.  2. GERD - Trial of food elimination diet.  3.  Knee OA, primarily medial compartment -If he stops getting relief from gel injections, could contemplate OA bracing.  4.  Mucous retention cyst of right maxillary sinus - Try NeilMed sinus rinse.  If becomes more symptomatic, ENT consult.     Procedures: No procedures performed        PMFS History: Patient Active Problem List   Diagnosis Date Noted  . Acne rosacea 03/20/2020  . Adenomatous polyp of colon 03/20/2020  . Body mass index (BMI) 31.0-31.9, adult 03/20/2020  . 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 Erectile dysfunction 03/20/2020  . Esophageal stricture 03/20/2020  . Essential hypertension 03/20/2020  . Essential tremor 03/20/2020  . Gait abnormality 03/20/2020  . Gastro-esophageal reflux disease without esophagitis 03/20/2020  . 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 Hypercholesterolemia 03/20/2020  . Irritable bowel syndrome 03/20/2020  . Male hypogonadism 03/20/2020  . Osteoarthritis of knee 03/20/2020  . Perennial allergic rhinitis 03/20/2020  . Vasomotor rhinitis 03/20/2020   Past Medical History:  Diagnosis Date  . Arthritis   .  Hyperlipemia   . Hypertension   . Macular degeneration, wet ([place])     Family History  Problem Relation Age of Onset  . Colon cancer Mother 57  . Ovarian cancer Mother   . Prostate cancer Father   . Healthy Brother   . Breast cancer Daughter        x1 oldest daughter  . Hashimoto's thyroiditis Daughter        youngest daughter    Past Surgical History:  Procedure Laterality Date  . ANKLE SURGERY    . CATARACT EXTRACTION, BILATERAL    . TONSILLECTOMY     Social History   Occupational History  . Occupation: retired  Tobacco Use  . Smoking status: Former Smoker    Quit date: 1974     Years since quitting: 48.0  . Smokeless tobacco: Never Used  Vaping Use  . Vaping Use: Never used  Substance and Sexual Activity  . Alcohol use: Yes    Alcohol/week: 3.0 standard drinks    Types: 3 Glasses of wine per week    Comment: 2-3 drinks/night  . Drug use: Never  . Sexual activity: Not on file

## 2020-04-15 ENCOUNTER — Encounter (INDEPENDENT_AMBULATORY_CARE_PROVIDER_SITE_OTHER): Payer: Medicare Other | Admitting: Ophthalmology

## 2020-04-16 ENCOUNTER — Encounter (INDEPENDENT_AMBULATORY_CARE_PROVIDER_SITE_OTHER): Payer: Medicare Other | Admitting: Ophthalmology

## 2020-04-16 ENCOUNTER — Other Ambulatory Visit: Payer: Self-pay

## 2020-04-16 DIAGNOSIS — I10 Essential (primary) hypertension: Secondary | ICD-10-CM

## 2020-04-16 DIAGNOSIS — H35033 Hypertensive retinopathy, bilateral: Secondary | ICD-10-CM

## 2020-04-16 DIAGNOSIS — H43813 Vitreous degeneration, bilateral: Secondary | ICD-10-CM

## 2020-04-16 DIAGNOSIS — H353211 Exudative age-related macular degeneration, right eye, with active choroidal neovascularization: Secondary | ICD-10-CM

## 2020-04-16 DIAGNOSIS — H353122 Nonexudative age-related macular degeneration, left eye, intermediate dry stage: Secondary | ICD-10-CM

## 2020-05-01 ENCOUNTER — Other Ambulatory Visit: Payer: Self-pay

## 2020-05-01 ENCOUNTER — Ambulatory Visit (INDEPENDENT_AMBULATORY_CARE_PROVIDER_SITE_OTHER): Payer: Medicare Other | Admitting: Family Medicine

## 2020-05-01 DIAGNOSIS — M2550 Pain in unspecified joint: Secondary | ICD-10-CM | POA: Diagnosis not present

## 2020-05-01 DIAGNOSIS — M79605 Pain in left leg: Secondary | ICD-10-CM

## 2020-05-01 DIAGNOSIS — R0600 Dyspnea, unspecified: Secondary | ICD-10-CM | POA: Diagnosis not present

## 2020-05-01 DIAGNOSIS — R600 Localized edema: Secondary | ICD-10-CM | POA: Diagnosis not present

## 2020-05-01 DIAGNOSIS — R29898 Other symptoms and signs involving the musculoskeletal system: Secondary | ICD-10-CM | POA: Diagnosis not present

## 2020-05-01 DIAGNOSIS — M79604 Pain in right leg: Secondary | ICD-10-CM

## 2020-05-01 DIAGNOSIS — R0609 Other forms of dyspnea: Secondary | ICD-10-CM

## 2020-05-01 NOTE — Progress Notes (Signed)
Office Visit Note   Patient: Randy Wood           Date of Birth: 06-05-1940           MRN: [id number] Visit Date: 05/01/2020 Requested by: Eunice Blase, MD [place],  [place] [address] PCP: Eunice Blase, MD  Subjective: Chief Complaint  Patient presents with  . Other    Weakness and pain in both legs. Difficulty with standing up after sitting a while. Has tried Ibuprofen 800 mg bid - did help. Stopped when he had some abdominal pain. The leg pain returned. He restarted the ibuprofen yesterday. Daughter accompanies the patient today. She asks about PT in- home vs outpatient.     HPI: He is here with bilateral leg pain and weakness.  He presents with his daughter from [employer] today.  Sometime after January 18, he developed fairly sudden onset of increased bilateral leg pain, bilateral leg swelling and weakness.  He had his normal routine eye doctor visit and received his usual eye injection, then the next day these symptoms started.  He has never had this happen before.  He does note that he has been in a lot of pain from his knees lately and has been using ibuprofen 800 mg twice daily.  He stopped using it temporarily because of upset stomach.  He had a gel injection in his right knee in December but this time it did not help much.  Recently he fell off the commode and it took him 4 hours before he can get up again.  He had quite a bit of pain in his legs at that time, and especially weakness.  When he called his daughter, she says that he was wheezing.  That seems to have resolved.  He has never had any troubles with asthma before, no history of CHF.  Denies any blood in his stool, denies any melena.               ROS:   All other systems were reviewed and are negative.  Objective: Vital Signs: There were no vitals taken for this visit.  Physical Exam:  General:  Alert and oriented, in no acute distress. Pulm:  Breathing unlabored. Psy:  Normal mood, congruent  affect.  CV: Regular rate and rhythm without murmurs, rubs, or gallops.  2-3+ bilateral lower extremity peripheral edema to mid thigh.  2+ radial and posterior tibial pulses. Lungs: Clear to auscultation throughout with no wheezing or areas of consolidation. Knees: No detectable effusion in either knee today.    Imaging: No results found.  Assessment & Plan: 1.  Shortness of breath with exertion, bilateral leg pain and swelling.  Possibilities include CHF, DVT with pulmonary embolus, bilateral knee osteoarthritis with deconditioning.  Cannot rule out GI bleed with anemia. -We will order labs and call him with results.  If labs are unremarkable, then home health physical therapy. -He will stop ibuprofen and use Tylenol for pain.     Procedures: No procedures performed        PMFS History: Patient Active Problem List   Diagnosis Date Noted  . Acne rosacea 03/20/2020  . Adenomatous polyp of colon 03/20/2020  . Body mass index (BMI) 31.0-31.9, adult 03/20/2020  . 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 Erectile dysfunction 03/20/2020  . Esophageal stricture 03/20/2020  . Essential hypertension 03/20/2020  . Essential tremor 03/20/2020  . Gait abnormality 03/20/2020  . Gastro-esophageal reflux disease without esophagitis 03/20/2020  . Grover's  disease 03/20/2020  . Hypercholesterolemia 03/20/2020  . Irritable bowel syndrome 03/20/2020  . Male hypogonadism 03/20/2020  . Osteoarthritis of knee 03/20/2020  . Perennial allergic rhinitis 03/20/2020  . Vasomotor rhinitis 03/20/2020   Past Medical History:  Diagnosis Date  . Arthritis   . Hyperlipemia   . Hypertension   . Macular degeneration, wet ([facility])     Family History  Problem Relation Age of Onset  . Colon cancer Mother 68  . Ovarian cancer Mother   . Prostate cancer Father   . Healthy Brother   . Breast cancer Daughter        x1 oldest daughter  . Hashimoto's thyroiditis Daughter        youngest  daughter    Past Surgical History:  Procedure Laterality Date  . ANKLE SURGERY    . CATARACT EXTRACTION, BILATERAL    . TONSILLECTOMY     Social History   Occupational History  . Occupation: retired  Tobacco Use  . Smoking status: Former Smoker    Quit date: 1974    Years since quitting: 48.1  . Smokeless tobacco: Never Used  Vaping Use  . Vaping Use: Never used  Substance and Sexual Activity  . Alcohol use: Yes    Alcohol/week: 3.0 standard drinks    Types: 3 Glasses of wine per week    Comment: 2-3 drinks/night  . Drug use: Never  . Sexual activity: Not on file

## 2020-05-02 ENCOUNTER — Ambulatory Visit (HOSPITAL_COMMUNITY)
Admission: RE | Admit: 2020-05-02 | Discharge: 2020-05-02 | Disposition: A | Discharge status: Home / Self Care | Payer: Medicare Other | Source: Ambulatory Visit | Attending: Family Medicine | Admitting: Family Medicine

## 2020-05-02 ENCOUNTER — Telehealth: Payer: Self-pay | Admitting: Family Medicine

## 2020-05-02 ENCOUNTER — Telehealth: Payer: Self-pay

## 2020-05-02 DIAGNOSIS — K760 Fatty (change of) liver, not elsewhere classified: Secondary | ICD-10-CM | POA: Diagnosis not present

## 2020-05-02 DIAGNOSIS — R7989 Other specified abnormal findings of blood chemistry: Secondary | ICD-10-CM | POA: Insufficient documentation

## 2020-05-02 DIAGNOSIS — R600 Localized edema: Secondary | ICD-10-CM

## 2020-05-02 DIAGNOSIS — R0600 Dyspnea, unspecified: Secondary | ICD-10-CM

## 2020-05-02 DIAGNOSIS — R748 Abnormal levels of other serum enzymes: Secondary | ICD-10-CM

## 2020-05-02 DIAGNOSIS — R0609 Other forms of dyspnea: Secondary | ICD-10-CM

## 2020-05-02 DIAGNOSIS — K7040 Alcoholic hepatic failure without coma: Secondary | ICD-10-CM | POA: Diagnosis not present

## 2020-05-02 DIAGNOSIS — M79605 Pain in left leg: Secondary | ICD-10-CM

## 2020-05-02 DIAGNOSIS — R188 Other ascites: Secondary | ICD-10-CM | POA: Diagnosis not present

## 2020-05-02 DIAGNOSIS — R0602 Shortness of breath: Secondary | ICD-10-CM | POA: Diagnosis not present

## 2020-05-02 DIAGNOSIS — R601 Generalized edema: Secondary | ICD-10-CM | POA: Diagnosis not present

## 2020-05-02 LAB — COMPREHENSIVE METABOLIC PANEL
AG Ratio: 1.3 (calc) (ref 1.0–2.5)
ALT: 78 U/L — ABNORMAL HIGH (ref 9–46)
AST: 145 U/L — ABNORMAL HIGH (ref 10–35)
Albumin: 3.3 g/dL — ABNORMAL LOW (ref 3.6–5.1)
Alkaline phosphatase (APISO): 255 U/L — ABNORMAL HIGH (ref 35–144)
BUN/Creatinine Ratio: 14 (calc) (ref 6–22)
BUN: 21 mg/dL (ref 7–25)
CO2: 20 mmol/L (ref 20–32)
Calcium: 8.8 mg/dL (ref 8.6–10.3)
Chloride: 100 mmol/L (ref 98–110)
Creat: 1.53 mg/dL — ABNORMAL HIGH (ref 0.70–1.11)
Globulin: 2.5 g/dL (calc) (ref 1.9–3.7)
Glucose, Bld: 98 mg/dL (ref 65–99)
Potassium: 4.2 mmol/L (ref 3.5–5.3)
Sodium: 139 mmol/L (ref 135–146)
Total Bilirubin: 3.2 mg/dL — ABNORMAL HIGH (ref 0.2–1.2)
Total Protein: 5.8 g/dL — ABNORMAL LOW (ref 6.1–8.1)

## 2020-05-02 LAB — CBC WITH DIFFERENTIAL/PLATELET
Absolute Monocytes: 1058 cells/uL — ABNORMAL HIGH (ref 200–950)
Basophils Absolute: 26 cells/uL (ref 0–200)
Basophils Relative: 0.3 %
Eosinophils Absolute: 17 cells/uL (ref 15–500)
Eosinophils Relative: 0.2 %
HCT: 35.2 % — ABNORMAL LOW (ref 38.5–50.0)
Hemoglobin: 12.1 g/dL — ABNORMAL LOW (ref 13.2–17.1)
Lymphs Abs: 1634 cells/uL (ref 850–3900)
MCH: 36.9 pg — ABNORMAL HIGH (ref 27.0–33.0)
MCHC: 34.4 g/dL (ref 32.0–36.0)
MCV: 107.3 fL — ABNORMAL HIGH (ref 80.0–100.0)
MPV: 10.6 fL (ref 7.5–12.5)
Monocytes Relative: 12.3 %
Neutro Abs: 5865 cells/uL (ref 1500–7800)
Neutrophils Relative %: 68.2 %
Platelets: 208 10*3/uL (ref 140–400)
RBC: 3.28 10*6/uL — ABNORMAL LOW (ref 4.20–5.80)
RDW: 11.6 % (ref 11.0–15.0)
Total Lymphocyte: 19 %
WBC: 8.6 10*3/uL (ref 3.8–10.8)

## 2020-05-02 LAB — IRON,TIBC AND FERRITIN PANEL
%SAT: 75 % (calc) — ABNORMAL HIGH (ref 20–48)
Ferritin: 605 ng/mL — ABNORMAL HIGH (ref 24–380)
Iron: 125 ug/dL (ref 50–180)
TIBC: 166 mcg/dL (calc) — ABNORMAL LOW (ref 250–425)

## 2020-05-02 LAB — D-DIMER, QUANTITATIVE: D-Dimer, Quant: 1.07 mcg/mL FEU — ABNORMAL HIGH (ref <0.50)

## 2020-05-02 LAB — TSH: TSH: 5.56 mIU/L — ABNORMAL HIGH (ref 0.40–4.50)

## 2020-05-02 LAB — BRAIN NATRIURETIC PEPTIDE: Brain Natriuretic Peptide: 214 pg/mL — ABNORMAL HIGH (ref <100)

## 2020-05-02 NOTE — Telephone Encounter (Signed)
Patient's daughter [name] called needing to know the results of the test done yeaterday. [name] said she will have to get off work to take her father to all of his appointments and she's trying to request off in advance. The number to contact [name] is [phone]

## 2020-05-02 NOTE — Telephone Encounter (Signed)
Other labs are notable for the following:  CBC shows abnormalities which are stable since 1 year ago, hemoglobin and hematocrit are slightly low and MCV is elevated.  Thyroid TSH is abnormal at 5.56.  This would suggest hypothyroidism.  This could definitely be affecting symptoms.  Kidney function/creatinine is abnormal at 1.53.  Reason is uncertain.  This needs to be rechecked in a few weeks.  Liver enzymes, AST and ALT, along with bilirubin and alkaline phosphatase, are all elevated.  Reason is uncertain, but it could be related to the heart.  BNP is elevated at 214.  This is often suggestive of congestive heart failure.

## 2020-05-02 NOTE — Telephone Encounter (Signed)
Patient has appointment scheduled with Dr. Johney Frame (DOD) on Wednesday 05/08/20. Will place order for echo and LE ABI's. Patient already having venous duplex today. Will send message to scheduling to call patient and make appointments for test.

## 2020-05-02 NOTE — Telephone Encounter (Signed)
Please advise 

## 2020-05-02 NOTE — Telephone Encounter (Signed)
Mathew with Cardiovascular wanted to let Dr. Junius Roads know that patient is Negative for DVT, Bilateral LE.  Cb# [phone].  Patient was sent home.

## 2020-05-02 NOTE — Telephone Encounter (Signed)
FYI. Cardiology appt scheduled for 2/09 and they also scheduled an echo later in the month.

## 2020-05-02 NOTE — Telephone Encounter (Signed)
Will see what we can do but not clear from your notes that this is cardiac related. He will need LE ABI's and and echo  Pam- can you see about getting him in with me or DOD in next week or so   Can pre order echo for dyspnea elevated BNP LE ABI's for leg pain

## 2020-05-02 NOTE — Telephone Encounter (Signed)
The patient was notified of these results through MyChart message from Dr. Junius Roads. I called and spoke with the patient's daughter, [name], advising her the imaging facility will be calling her/the patient about an appointment today.

## 2020-05-02 NOTE — Telephone Encounter (Signed)
By chance could we get him in with cardiology tomorrow (Friday)?

## 2020-05-02 NOTE — Addendum Note (Signed)
Addended by: Aris Georgia, Hilary Pundt L on: 05/02/2020 12:24 PM   Modules accepted: Orders

## 2020-05-02 NOTE — Telephone Encounter (Signed)
D-Dimer was positive.  It was a low number (1.07), but positive nonetheless.  Therefore, I will order doppler studies of the legs to look for blood clots.

## 2020-05-03 ENCOUNTER — Emergency Department (HOSPITAL_COMMUNITY): Payer: Medicare Other

## 2020-05-03 ENCOUNTER — Encounter (HOSPITAL_COMMUNITY): Payer: Self-pay

## 2020-05-03 ENCOUNTER — Other Ambulatory Visit: Payer: Self-pay

## 2020-05-03 ENCOUNTER — Ambulatory Visit: Payer: BLUE CROSS/BLUE SHIELD | Admitting: Internal Medicine

## 2020-05-03 ENCOUNTER — Telehealth: Payer: Self-pay | Admitting: Family Medicine

## 2020-05-03 ENCOUNTER — Inpatient Hospital Stay (HOSPITAL_COMMUNITY)
Admission: EM | Admit: 2020-05-03 | Discharge: 2020-05-11 | DRG: 432 | Disposition: A | Discharge status: Home Health | Payer: Medicare Other | Attending: Internal Medicine | Admitting: Internal Medicine

## 2020-05-03 ENCOUNTER — Encounter: Payer: Self-pay | Admitting: Family Medicine

## 2020-05-03 DIAGNOSIS — R609 Edema, unspecified: Secondary | ICD-10-CM | POA: Diagnosis not present

## 2020-05-03 DIAGNOSIS — R159 Full incontinence of feces: Secondary | ICD-10-CM | POA: Diagnosis present

## 2020-05-03 DIAGNOSIS — B9620 Unspecified Escherichia coli [E. coli] as the cause of diseases classified elsewhere: Secondary | ICD-10-CM | POA: Diagnosis present

## 2020-05-03 DIAGNOSIS — K7011 Alcoholic hepatitis with ascites: Secondary | ICD-10-CM | POA: Diagnosis present

## 2020-05-03 DIAGNOSIS — F1010 Alcohol abuse, uncomplicated: Secondary | ICD-10-CM | POA: Diagnosis present

## 2020-05-03 DIAGNOSIS — N179 Acute kidney failure, unspecified: Secondary | ICD-10-CM | POA: Diagnosis present

## 2020-05-03 DIAGNOSIS — D539 Nutritional anemia, unspecified: Secondary | ICD-10-CM | POA: Diagnosis present

## 2020-05-03 DIAGNOSIS — I10 Essential (primary) hypertension: Secondary | ICD-10-CM | POA: Diagnosis present

## 2020-05-03 DIAGNOSIS — Z800 Family history of malignant neoplasm of digestive organs: Secondary | ICD-10-CM

## 2020-05-03 DIAGNOSIS — Z1612 Extended spectrum beta lactamase (ESBL) resistance: Secondary | ICD-10-CM | POA: Diagnosis present

## 2020-05-03 DIAGNOSIS — R7401 Elevation of levels of liver transaminase levels: Secondary | ICD-10-CM | POA: Diagnosis present

## 2020-05-03 DIAGNOSIS — K7031 Alcoholic cirrhosis of liver with ascites: Secondary | ICD-10-CM | POA: Diagnosis present

## 2020-05-03 DIAGNOSIS — R601 Generalized edema: Secondary | ICD-10-CM | POA: Diagnosis present

## 2020-05-03 DIAGNOSIS — D509 Iron deficiency anemia, unspecified: Secondary | ICD-10-CM | POA: Diagnosis present

## 2020-05-03 DIAGNOSIS — K769 Liver disease, unspecified: Secondary | ICD-10-CM

## 2020-05-03 DIAGNOSIS — E785 Hyperlipidemia, unspecified: Secondary | ICD-10-CM | POA: Diagnosis present

## 2020-05-03 DIAGNOSIS — R0600 Dyspnea, unspecified: Secondary | ICD-10-CM

## 2020-05-03 DIAGNOSIS — Z20822 Contact with and (suspected) exposure to covid-19: Secondary | ICD-10-CM | POA: Diagnosis present

## 2020-05-03 DIAGNOSIS — K7460 Unspecified cirrhosis of liver: Secondary | ICD-10-CM

## 2020-05-03 DIAGNOSIS — R188 Other ascites: Secondary | ICD-10-CM

## 2020-05-03 DIAGNOSIS — M1710 Unilateral primary osteoarthritis, unspecified knee: Secondary | ICD-10-CM | POA: Diagnosis present

## 2020-05-03 DIAGNOSIS — Z87891 Personal history of nicotine dependence: Secondary | ICD-10-CM

## 2020-05-03 DIAGNOSIS — R0602 Shortness of breath: Secondary | ICD-10-CM | POA: Diagnosis present

## 2020-05-03 DIAGNOSIS — R32 Unspecified urinary incontinence: Secondary | ICD-10-CM | POA: Diagnosis present

## 2020-05-03 DIAGNOSIS — K766 Portal hypertension: Secondary | ICD-10-CM | POA: Diagnosis present

## 2020-05-03 DIAGNOSIS — E876 Hypokalemia: Secondary | ICD-10-CM | POA: Diagnosis present

## 2020-05-03 DIAGNOSIS — K7040 Alcoholic hepatic failure without coma: Principal | ICD-10-CM | POA: Diagnosis present

## 2020-05-03 DIAGNOSIS — R600 Localized edema: Secondary | ICD-10-CM

## 2020-05-03 DIAGNOSIS — R5381 Other malaise: Secondary | ICD-10-CM | POA: Diagnosis not present

## 2020-05-03 DIAGNOSIS — N390 Urinary tract infection, site not specified: Secondary | ICD-10-CM | POA: Diagnosis present

## 2020-05-03 DIAGNOSIS — R0609 Other forms of dyspnea: Secondary | ICD-10-CM

## 2020-05-03 DIAGNOSIS — M179 Osteoarthritis of knee, unspecified: Secondary | ICD-10-CM | POA: Diagnosis present

## 2020-05-03 DIAGNOSIS — K7290 Hepatic failure, unspecified without coma: Secondary | ICD-10-CM | POA: Diagnosis present

## 2020-05-03 DIAGNOSIS — K652 Spontaneous bacterial peritonitis: Secondary | ICD-10-CM

## 2020-05-03 DIAGNOSIS — G8929 Other chronic pain: Secondary | ICD-10-CM | POA: Diagnosis present

## 2020-05-03 DIAGNOSIS — K767 Hepatorenal syndrome: Secondary | ICD-10-CM | POA: Diagnosis present

## 2020-05-03 DIAGNOSIS — R7989 Other specified abnormal findings of blood chemistry: Secondary | ICD-10-CM

## 2020-05-03 DIAGNOSIS — Z79899 Other long term (current) drug therapy: Secondary | ICD-10-CM

## 2020-05-03 DIAGNOSIS — E8809 Other disorders of plasma-protein metabolism, not elsewhere classified: Secondary | ICD-10-CM | POA: Diagnosis present

## 2020-05-03 DIAGNOSIS — R0902 Hypoxemia: Secondary | ICD-10-CM | POA: Diagnosis not present

## 2020-05-03 DIAGNOSIS — E7800 Pure hypercholesterolemia, unspecified: Secondary | ICD-10-CM | POA: Diagnosis present

## 2020-05-03 DIAGNOSIS — K219 Gastro-esophageal reflux disease without esophagitis: Secondary | ICD-10-CM | POA: Diagnosis present

## 2020-05-03 DIAGNOSIS — K5900 Constipation, unspecified: Secondary | ICD-10-CM | POA: Diagnosis present

## 2020-05-03 DIAGNOSIS — Z8042 Family history of malignant neoplasm of prostate: Secondary | ICD-10-CM

## 2020-05-03 DIAGNOSIS — A498 Other bacterial infections of unspecified site: Secondary | ICD-10-CM

## 2020-05-03 LAB — BRAIN NATRIURETIC PEPTIDE: B Natriuretic Peptide: 237.7 pg/mL — ABNORMAL HIGH (ref 0.0–100.0)

## 2020-05-03 LAB — BASIC METABOLIC PANEL
Anion gap: 17 — ABNORMAL HIGH (ref 5–15)
BUN: 26 mg/dL — ABNORMAL HIGH (ref 8–23)
CO2: 19 mmol/L — ABNORMAL LOW (ref 22–32)
Calcium: 8.5 mg/dL — ABNORMAL LOW (ref 8.9–10.3)
Chloride: 101 mmol/L (ref 98–111)
Creatinine, Ser: 2.8 mg/dL — ABNORMAL HIGH (ref 0.61–1.24)
GFR, Estimated: 22 mL/min — ABNORMAL LOW (ref >60)
Glucose, Bld: 151 mg/dL — ABNORMAL HIGH (ref 70–99)
Potassium: 3.9 mmol/L (ref 3.5–5.1)
Sodium: 137 mmol/L (ref 135–145)

## 2020-05-03 LAB — CBC
HCT: 36.7 % — ABNORMAL LOW (ref 39.0–52.0)
Hemoglobin: 12.2 g/dL — ABNORMAL LOW (ref 13.0–17.0)
MCH: 36.6 pg — ABNORMAL HIGH (ref 26.0–34.0)
MCHC: 33.2 g/dL (ref 30.0–36.0)
MCV: 110.2 fL — ABNORMAL HIGH (ref 80.0–100.0)
Platelets: 182 10*3/uL (ref 150–400)
RBC: 3.33 MIL/uL — ABNORMAL LOW (ref 4.22–5.81)
RDW: 14.3 % (ref 11.5–15.5)
WBC: 7.3 10*3/uL (ref 4.0–10.5)
nRBC: 0 % (ref 0.0–0.2)

## 2020-05-03 NOTE — Telephone Encounter (Signed)
Perfect, thanks 

## 2020-05-03 NOTE — ED Triage Notes (Signed)
Pt bib ems for sobx1 month, PCP wants cardiology referral for new onset CHF. DOE, 98% sitting on room air, 88% on room air while ambulating. Pt a.o

## 2020-05-03 NOTE — Telephone Encounter (Signed)
Patient aware of negative doppler findings.  Suspicion is for CHF.  Will attempt to get urgent cardiology consult.

## 2020-05-03 NOTE — Telephone Encounter (Signed)
I inbasket message to scheduler asking if can get pt in today for Cards, I sent as URGENT, hopefully they can get him in. However, he is scheduled for Wednesday Feb 9.

## 2020-05-04 ENCOUNTER — Encounter: Payer: Self-pay | Admitting: Family Medicine

## 2020-05-04 ENCOUNTER — Inpatient Hospital Stay (HOSPITAL_COMMUNITY): Payer: Medicare Other

## 2020-05-04 ENCOUNTER — Emergency Department (HOSPITAL_COMMUNITY): Payer: Medicare Other

## 2020-05-04 DIAGNOSIS — K652 Spontaneous bacterial peritonitis: Secondary | ICD-10-CM | POA: Diagnosis not present

## 2020-05-04 DIAGNOSIS — R0600 Dyspnea, unspecified: Secondary | ICD-10-CM | POA: Diagnosis not present

## 2020-05-04 DIAGNOSIS — F1010 Alcohol abuse, uncomplicated: Secondary | ICD-10-CM | POA: Diagnosis present

## 2020-05-04 DIAGNOSIS — N179 Acute kidney failure, unspecified: Secondary | ICD-10-CM | POA: Diagnosis not present

## 2020-05-04 DIAGNOSIS — K219 Gastro-esophageal reflux disease without esophagitis: Secondary | ICD-10-CM

## 2020-05-04 DIAGNOSIS — K760 Fatty (change of) liver, not elsewhere classified: Secondary | ICD-10-CM | POA: Diagnosis not present

## 2020-05-04 DIAGNOSIS — K766 Portal hypertension: Secondary | ICD-10-CM | POA: Diagnosis present

## 2020-05-04 DIAGNOSIS — A498 Other bacterial infections of unspecified site: Secondary | ICD-10-CM | POA: Diagnosis not present

## 2020-05-04 DIAGNOSIS — K7290 Hepatic failure, unspecified without coma: Secondary | ICD-10-CM | POA: Diagnosis present

## 2020-05-04 DIAGNOSIS — R29898 Other symptoms and signs involving the musculoskeletal system: Secondary | ICD-10-CM | POA: Diagnosis not present

## 2020-05-04 DIAGNOSIS — M1710 Unilateral primary osteoarthritis, unspecified knee: Secondary | ICD-10-CM | POA: Diagnosis not present

## 2020-05-04 DIAGNOSIS — R600 Localized edema: Secondary | ICD-10-CM | POA: Diagnosis not present

## 2020-05-04 DIAGNOSIS — Z1612 Extended spectrum beta lactamase (ESBL) resistance: Secondary | ICD-10-CM | POA: Diagnosis not present

## 2020-05-04 DIAGNOSIS — K7200 Acute and subacute hepatic failure without coma: Secondary | ICD-10-CM | POA: Diagnosis not present

## 2020-05-04 DIAGNOSIS — R188 Other ascites: Secondary | ICD-10-CM | POA: Diagnosis not present

## 2020-05-04 DIAGNOSIS — Z800 Family history of malignant neoplasm of digestive organs: Secondary | ICD-10-CM | POA: Diagnosis not present

## 2020-05-04 DIAGNOSIS — K767 Hepatorenal syndrome: Secondary | ICD-10-CM | POA: Diagnosis present

## 2020-05-04 DIAGNOSIS — R7401 Elevation of levels of liver transaminase levels: Secondary | ICD-10-CM | POA: Diagnosis present

## 2020-05-04 DIAGNOSIS — B9620 Unspecified Escherichia coli [E. coli] as the cause of diseases classified elsewhere: Secondary | ICD-10-CM | POA: Diagnosis not present

## 2020-05-04 DIAGNOSIS — R0602 Shortness of breath: Secondary | ICD-10-CM | POA: Diagnosis not present

## 2020-05-04 DIAGNOSIS — K7040 Alcoholic hepatic failure without coma: Secondary | ICD-10-CM | POA: Diagnosis present

## 2020-05-04 DIAGNOSIS — R609 Edema, unspecified: Secondary | ICD-10-CM | POA: Diagnosis not present

## 2020-05-04 DIAGNOSIS — Z20822 Contact with and (suspected) exposure to covid-19: Secondary | ICD-10-CM | POA: Diagnosis present

## 2020-05-04 DIAGNOSIS — Z87891 Personal history of nicotine dependence: Secondary | ICD-10-CM | POA: Diagnosis not present

## 2020-05-04 DIAGNOSIS — N390 Urinary tract infection, site not specified: Secondary | ICD-10-CM | POA: Diagnosis present

## 2020-05-04 DIAGNOSIS — E876 Hypokalemia: Secondary | ICD-10-CM | POA: Diagnosis present

## 2020-05-04 DIAGNOSIS — E7800 Pure hypercholesterolemia, unspecified: Secondary | ICD-10-CM | POA: Diagnosis present

## 2020-05-04 DIAGNOSIS — R0609 Other forms of dyspnea: Secondary | ICD-10-CM | POA: Diagnosis present

## 2020-05-04 DIAGNOSIS — R32 Unspecified urinary incontinence: Secondary | ICD-10-CM | POA: Diagnosis present

## 2020-05-04 DIAGNOSIS — D539 Nutritional anemia, unspecified: Secondary | ICD-10-CM

## 2020-05-04 DIAGNOSIS — R197 Diarrhea, unspecified: Secondary | ICD-10-CM | POA: Diagnosis not present

## 2020-05-04 DIAGNOSIS — K7011 Alcoholic hepatitis with ascites: Secondary | ICD-10-CM | POA: Diagnosis present

## 2020-05-04 DIAGNOSIS — E8809 Other disorders of plasma-protein metabolism, not elsewhere classified: Secondary | ICD-10-CM | POA: Diagnosis present

## 2020-05-04 DIAGNOSIS — R000 Tachycardia, unspecified: Secondary | ICD-10-CM | POA: Diagnosis not present

## 2020-05-04 DIAGNOSIS — K7460 Unspecified cirrhosis of liver: Secondary | ICD-10-CM | POA: Diagnosis not present

## 2020-05-04 DIAGNOSIS — E785 Hyperlipidemia, unspecified: Secondary | ICD-10-CM | POA: Diagnosis present

## 2020-05-04 DIAGNOSIS — K7031 Alcoholic cirrhosis of liver with ascites: Secondary | ICD-10-CM | POA: Diagnosis present

## 2020-05-04 DIAGNOSIS — K7469 Other cirrhosis of liver: Secondary | ICD-10-CM | POA: Diagnosis not present

## 2020-05-04 DIAGNOSIS — G8929 Other chronic pain: Secondary | ICD-10-CM | POA: Diagnosis present

## 2020-05-04 DIAGNOSIS — R601 Generalized edema: Secondary | ICD-10-CM | POA: Diagnosis not present

## 2020-05-04 DIAGNOSIS — Z8042 Family history of malignant neoplasm of prostate: Secondary | ICD-10-CM | POA: Diagnosis not present

## 2020-05-04 DIAGNOSIS — D509 Iron deficiency anemia, unspecified: Secondary | ICD-10-CM | POA: Diagnosis present

## 2020-05-04 DIAGNOSIS — I10 Essential (primary) hypertension: Secondary | ICD-10-CM | POA: Diagnosis not present

## 2020-05-04 LAB — URINALYSIS, ROUTINE W REFLEX MICROSCOPIC
Glucose, UA: NEGATIVE mg/dL
Hgb urine dipstick: NEGATIVE
Ketones, ur: 5 mg/dL — AB
Nitrite: NEGATIVE
Protein, ur: 30 mg/dL — AB
Specific Gravity, Urine: 1.02 (ref 1.005–1.030)
WBC, UA: >50 WBC/hpf — ABNORMAL HIGH (ref 0–5)
pH: 5 (ref 5.0–8.0)

## 2020-05-04 LAB — HEPATIC FUNCTION PANEL
ALT: 64 U/L — ABNORMAL HIGH (ref 0–44)
AST: 104 U/L — ABNORMAL HIGH (ref 15–41)
Albumin: 2.7 g/dL — ABNORMAL LOW (ref 3.5–5.0)
Alkaline Phosphatase: 212 U/L — ABNORMAL HIGH (ref 38–126)
Bilirubin, Direct: 1.8 mg/dL — ABNORMAL HIGH (ref 0.0–0.2)
Indirect Bilirubin: 1.9 mg/dL — ABNORMAL HIGH (ref 0.3–0.9)
Total Bilirubin: 3.7 mg/dL — ABNORMAL HIGH (ref 0.3–1.2)
Total Protein: 6.3 g/dL — ABNORMAL LOW (ref 6.5–8.1)

## 2020-05-04 LAB — PROTEIN, PLEURAL OR PERITONEAL FLUID: Total protein, fluid: <3 g/dL

## 2020-05-04 LAB — ACETAMINOPHEN LEVEL: Acetaminophen (Tylenol), Serum: 12 ug/mL (ref 10–30)

## 2020-05-04 LAB — PROTIME-INR
INR: 1.3 — ABNORMAL HIGH (ref 0.8–1.2)
Prothrombin Time: 15.4 seconds — ABNORMAL HIGH (ref 11.4–15.2)

## 2020-05-04 LAB — GRAM STAIN

## 2020-05-04 LAB — LACTATE DEHYDROGENASE, PLEURAL OR PERITONEAL FLUID: LD, Fluid: 101 U/L — ABNORMAL HIGH (ref 3–23)

## 2020-05-04 LAB — HEPATITIS PANEL, ACUTE
HCV Ab: NONREACTIVE
Hep A IgM: NONREACTIVE
Hep B C IgM: NONREACTIVE
Hepatitis B Surface Ag: NONREACTIVE

## 2020-05-04 LAB — CREATININE, URINE, RANDOM: Creatinine, Urine: 249.55 mg/dL

## 2020-05-04 LAB — GLUCOSE, PLEURAL OR PERITONEAL FLUID: Glucose, Fluid: 153 mg/dL

## 2020-05-04 LAB — SARS CORONAVIRUS 2 BY RT PCR (HOSPITAL ORDER, PERFORMED IN ~~LOC~~ HOSPITAL LAB): SARS Coronavirus 2: NEGATIVE

## 2020-05-04 LAB — ALBUMIN, PLEURAL OR PERITONEAL FLUID: Albumin, Fluid: <1 g/dL

## 2020-05-04 LAB — SODIUM, URINE, RANDOM: Sodium, Ur: <10 mmol/L

## 2020-05-04 MED ORDER — FLUTICASONE PROPIONATE 50 MCG/ACT NA SUSP
1.0000 | Freq: Every day | NASAL | Status: DC
  Administered 2020-05-04 – 2020-05-10 (×5): 1 via NASAL
  Filled 2020-05-04: qty 16

## 2020-05-04 MED ORDER — LIDOCAINE HCL (PF) 1 % IJ SOLN
INTRAMUSCULAR | Status: AC
  Filled 2020-05-04: qty 30

## 2020-05-04 MED ORDER — ALBUMIN HUMAN 25 % IV SOLN
50.0000 g | Freq: Once | INTRAVENOUS | Status: AC
  Administered 2020-05-04: 50 g via INTRAVENOUS
  Filled 2020-05-04: qty 200

## 2020-05-04 MED ORDER — SODIUM CHLORIDE 0.9% FLUSH
3.0000 mL | Freq: Two times a day (BID) | INTRAVENOUS | Status: DC
  Administered 2020-05-04 – 2020-05-11 (×15): 3 mL via INTRAVENOUS

## 2020-05-04 MED ORDER — FUROSEMIDE 20 MG PO TABS
40.0000 mg | ORAL_TABLET | Freq: Every day | ORAL | Status: DC
  Administered 2020-05-04: 40 mg via ORAL
  Filled 2020-05-04: qty 2

## 2020-05-04 MED ORDER — THIAMINE HCL 100 MG PO TABS
100.0000 mg | ORAL_TABLET | Freq: Every day | ORAL | Status: DC
  Administered 2020-05-04 – 2020-05-11 (×8): 100 mg via ORAL
  Filled 2020-05-04 (×8): qty 1

## 2020-05-04 MED ORDER — THIAMINE HCL 100 MG/ML IJ SOLN
100.0000 mg | Freq: Every day | INTRAMUSCULAR | Status: DC
  Filled 2020-05-04: qty 2

## 2020-05-04 MED ORDER — BRIMONIDINE TARTRATE 0.2 % OP SOLN
1.0000 [drp] | Freq: Two times a day (BID) | OPHTHALMIC | Status: DC
  Administered 2020-05-04 – 2020-05-11 (×15): 1 [drp] via OPHTHALMIC
  Filled 2020-05-04: qty 5

## 2020-05-04 MED ORDER — DORZOLAMIDE HCL-TIMOLOL MAL 2-0.5 % OP SOLN
1.0000 [drp] | Freq: Two times a day (BID) | OPHTHALMIC | Status: DC
  Administered 2020-05-04 – 2020-05-11 (×15): 1 [drp] via OPHTHALMIC
  Filled 2020-05-04: qty 10

## 2020-05-04 MED ORDER — LORAZEPAM 2 MG/ML IJ SOLN
1.0000 mg | INTRAMUSCULAR | Status: AC | PRN
Start: 2020-05-04 — End: 2020-05-07

## 2020-05-04 MED ORDER — DICLOFENAC SODIUM 1 % EX GEL
2.0000 g | Freq: Two times a day (BID) | CUTANEOUS | Status: DC
  Administered 2020-05-04 – 2020-05-11 (×15): 2 g via TOPICAL
  Filled 2020-05-04: qty 100

## 2020-05-04 MED ORDER — LORAZEPAM 1 MG PO TABS: 1.0000 mg | ORAL_TABLET | ORAL | Status: AC | PRN

## 2020-05-04 MED ORDER — FOLIC ACID 1 MG PO TABS
1.0000 mg | ORAL_TABLET | Freq: Every day | ORAL | Status: DC
  Administered 2020-05-04 – 2020-05-11 (×8): 1 mg via ORAL
  Filled 2020-05-04 (×8): qty 1

## 2020-05-04 MED ORDER — LORAZEPAM 2 MG/ML IJ SOLN: 0.0000 mg | Freq: Four times a day (QID) | INTRAMUSCULAR | Status: AC

## 2020-05-04 MED ORDER — ADULT MULTIVITAMIN W/MINERALS CH
1.0000 | ORAL_TABLET | Freq: Every day | ORAL | Status: DC
  Administered 2020-05-04 – 2020-05-11 (×8): 1 via ORAL
  Filled 2020-05-04 (×8): qty 1

## 2020-05-04 MED ORDER — ALBUTEROL SULFATE (2.5 MG/3ML) 0.083% IN NEBU: 2.5000 mg | INHALATION_SOLUTION | Freq: Four times a day (QID) | RESPIRATORY_TRACT | Status: DC | PRN

## 2020-05-04 MED ORDER — SODIUM CHLORIDE 0.9 % IV BOLUS
500.0000 mL | Freq: Once | INTRAVENOUS | Status: AC
  Administered 2020-05-04: 500 mL via INTRAVENOUS

## 2020-05-04 MED ORDER — LORAZEPAM 2 MG/ML IJ SOLN
0.0000 mg | Freq: Two times a day (BID) | INTRAMUSCULAR | Status: AC
  Filled 2020-05-04: qty 2

## 2020-05-04 NOTE — ED Notes (Signed)
Spoke with daughter Youlanda Mighty updated on status, told daughter she would be able to come and sit with patient.

## 2020-05-04 NOTE — H&P (Addendum)
History and Physical    ETHER PROVENCIO [id number] DOB: 02-05-1941 DOA: 05/03/2020  Referring MD/NP/PA: Malvin Johns, MD PCP: Eunice Blase, MD  Patient coming from: Home via EMS  Chief Complaint: Shortness of breath  I have personally briefly reviewed patient's old medical records in [place]   HPI: Randy Wood is a 80 y.o. male with medical history significant of hypertension, hyperlipidemia, alcohol abuse presents with complaints of shortness of breath and loss of strength in his leg.  Since the middle of September 2021 he reports that he has had progressively worsening swelling of his legs that now goes up into his thighs.  He has had shortness of breath on exertion.  At this time even trying to get dressed he is out of breath.  He has been seen by his primary care provider and had recently done labs which gave concern for the possibility of DVT.  Patient had a Doppler ultrasound of his lower extremities performed yesterday that was negative.  He is unsure of how much weight he has put on, but states that it is hard for him to pick up his legs he feels so heavy and his stomach has been getting more distended.  With the leg swelling it has also caused significant knee and joint pain.  He had been taking ibuprofen 800 mg twice daily up until the middle of December 2021.  Question if he had a urinary tract infection because his urine had changed to a more dark color.  Patient had not been tried on any diuretics.  Previously, when on hydrochlorothiazide and losartan some years ago for blood pressure had had several falls.  He does admit that he drinks 3 drinks of vodka and tonic water daily.  Usually each drink is made up of 1 ounce of vodka to 7 ounces of tonic water.  Denies having any fevers, cough, dysuria, confusion, or chest pain symptoms.    ED Course: Upon admission into the emergency department patient was seen to be afebrile with pressure 112/54-151/72, O2 saturations currently  maintained on room air, and all other vitals stable.  Labs from 2/4 significant for hemoglobin 12.2, BUN 26, creatinine 2.8(creatinine 1.53 on 2/2), BNP 237.7.  LFTs, hepatitis panel, and coags were pending.  Chest x-ray was clear. CT of the abdomen and pelvis significant for extensive ascites with diffuse infiltration of the liver concerning for cirrhosis.  Patient had been given 500 mL of normal saline IV fluids.  TRH called to admit.  Review of Systems  Constitutional: Negative for fever and malaise/fatigue.  HENT: Negative for congestion and nosebleeds.   Eyes: Negative for pain.  Respiratory: Positive for shortness of breath.   Cardiovascular: Positive for leg swelling. Negative for chest pain.  Gastrointestinal: Positive for abdominal pain. Negative for nausea and vomiting.  Genitourinary: Negative for dysuria and hematuria.  Musculoskeletal: Positive for joint pain. Negative for falls.  Skin: Negative for rash.  Neurological: Positive for weakness. Negative for loss of consciousness.  Psychiatric/Behavioral: Positive for substance abuse. Negative for memory loss.    Past Medical History:  Diagnosis Date  . Arthritis   . Hyperlipemia   . Hypertension   . Macular degeneration, wet ([place])     Past Surgical History:  Procedure Laterality Date  . ANKLE SURGERY    . CATARACT EXTRACTION, BILATERAL    . TONSILLECTOMY       reports that he quit smoking about 48 years ago. He has never used smokeless tobacco. He reports  current alcohol use of about 3.0 standard drinks of alcohol per week. He reports that he does not use drugs.  Allergies  Allergen Reactions  . Lisinopril Other (See Comments)    Family History  Problem Relation Age of Onset  . Colon cancer Mother 83  . Ovarian cancer Mother   . Prostate cancer Father   . Healthy Brother   . Breast cancer Daughter        x1 oldest daughter  . Hashimoto's thyroiditis Daughter        youngest daughter    Prior to Admission  medications   Medication Sig Start Date End Date Taking? Authorizing Provider  amLODipine (NORVASC) 10 MG tablet Take 10 mg by mouth daily.   Yes [provider]  brimonidine (ALPHAGAN) 0.2 % ophthalmic solution Place 1 drop into the left eye in the morning and at bedtime. 01/09/19  Yes [provider]  dorzolamide-timolol (COSOPT) 22.3-6.8 MG/ML ophthalmic solution Place 1 drop into the left eye 2 (two) times daily. 01/16/19  Yes [provider]  EPINEPHrine 0.3 mg/0.3 mL IJ SOAJ injection Inject 0.3 mg into the muscle as needed for anaphylaxis.   Yes [provider]  fluticasone (FLONASE) 50 MCG/ACT nasal spray Place 1 spray into both nostrils daily.   Yes [provider]  GLUCOSAMINE-CHONDROITIN PO Take 1 capsule by mouth daily.   Yes [provider]  Multiple Vitamins-Minerals (ONE-A-DAY MENS 50+) TABS Take 1 tablet by mouth daily.   Yes [provider]  Multiple Vitamins-Minerals (PRESERVISION AREDS 2 PO) Take 1 tablet by mouth daily.   Yes [provider]  omeprazole (PRILOSEC) 20 MG capsule Take 20 mg by mouth daily.   Yes [provider]  propranolol (INDERAL) 20 MG tablet Take 1 tablet (20 mg total) by mouth daily. 03/20/20  Yes Hilts, [name], MD    Physical Exam:  Constitutional: Elderly man currently in NAD, calm, comfortable Vitals:   05/04/20 0639 05/04/20 0725 05/04/20 0726 05/04/20 0745  BP: (!) 151/72 127/66  124/61  Pulse: 93 92  93  Resp: 18 18  19   Temp:      TempSrc:      SpO2: 98% 100%  100%  Weight:   100.7 kg   Height:   5\' 11"  (1.803 m)    Eyes: PERRL, mild scleral icterus appreciated ENMT: Mucous membranes are moist. Posterior pharynx clear of any exudate or lesions.   Neck: normal, supple, no masses, no thyromegaly Respiratory: clear to auscultation bilaterally, no wheezing, no crackles. Normal respiratory effort. No accessory muscle use.  Cardiovascular: Regular rate and  rhythm, no murmurs / rubs / gallops.  At least 2-3+ pitting edema of the bilateral lower extremities extending up into the thighs Abdomen: Abdominal distention with positive fluid wave.  Liver not palpable.  Bowel sounds present. Musculoskeletal: no clubbing / cyanosis. No joint deformity upper and lower extremities. Good ROM, no contractures. Normal muscle tone.  Skin: no rashes, lesions, ulcers. No induration. Neurologic: CN 2-12 grossly intact.  Patient able to move all extremities. Psychiatric: Normal judgment and insight. Alert and oriented x 3. Normal mood.     Labs on Admission: I have personally reviewed following labs and imaging studies  CBC: Recent Labs  Lab 05/01/20 1348 05/03/20 1152  WBC 8.6 7.3  NEUTROABS 5,865  --   HGB 12.1* 12.2*  HCT 35.2* 36.7*  MCV 107.3* 110.2*  PLT 208 338   Basic Metabolic Panel: Recent Labs  Lab 05/01/20 1348 05/03/20  1152  NA 139 137  K 4.2 3.9  CL 100 101  CO2 20 19*  GLUCOSE 98 151*  BUN 21 26*  CREATININE 1.53* 2.80*  CALCIUM 8.8 8.5*   GFR: Estimated Creatinine Clearance: 25.4 mL/min (A) (by C-G formula based on SCr of 2.8 mg/dL (H)). Liver Function Tests: Recent Labs  Lab 05/01/20 1348  AST 145*  ALT 78*  BILITOT 3.2*  PROT 5.8*   No results for input(s): LIPASE, AMYLASE in the last 168 hours. No results for input(s): AMMONIA in the last 168 hours. Coagulation Profile: No results for input(s): INR, PROTIME in the last 168 hours. Cardiac Enzymes: No results for input(s): CKTOTAL, CKMB, CKMBINDEX, TROPONINI in the last 168 hours. BNP (last 3 results) No results for input(s): PROBNP in the last 8760 hours. HbA1C: No results for input(s): HGBA1C in the last 72 hours. CBG: No results for input(s): GLUCAP in the last 168 hours. Lipid Profile: No results for input(s): CHOL, HDL, LDLCALC, TRIG, CHOLHDL, LDLDIRECT in the last 72 hours. Thyroid Function Tests: Recent Labs    05/01/20 1348  TSH 5.56*   Anemia  Panel: Recent Labs    05/01/20 1348  FERRITIN 605*  TIBC 166*  IRON 125   Urine analysis: No results found for: COLORURINE, APPEARANCEUR, LABSPEC, PHURINE, GLUCOSEU, HGBUR, BILIRUBINUR, KETONESUR, PROTEINUR, UROBILINOGEN, NITRITE, LEUKOCYTESUR Sepsis Labs: No results found for this or any previous visit (from the past 240 hour(s)).   Radiological Exams on Admission: CT Abdomen Pelvis Wo Contrast  Result Date: 05/04/2020 CLINICAL DATA:  Abdominal distension. Intermittent abdominal pain. New onset congestive heart failure. EXAM: CT ABDOMEN AND PELVIS WITHOUT CONTRAST TECHNIQUE: Multidetector CT imaging of the abdomen and pelvis was performed following the standard protocol without IV contrast. COMPARISON:  None. FINDINGS: Lower chest: Minimal atelectasis present at the right base. Lungs are otherwise clear. Atherosclerotic calcifications are present in the descending thoracic aorta. Coronary artery calcifications are present. Heart size is normal. No significant pericardial effusion is present. Hepatobiliary: Diffuse fatty infiltration liver is present. Abdominal ascites surround the liver. Liver margin is indistinct. No discrete mass lesion is present. Common bile duct and gallbladder are within normal limits. Pancreas: Unremarkable. No pancreatic ductal dilatation or surrounding inflammatory changes. Spleen: Normal in size without focal abnormality. Adrenals/Urinary Tract: Adrenal glands are normal bilaterally. Kidneys and ureters are unremarkable. No stone or mass lesion is present. No obstruction is present. The urinary bladder is within normal limits. Stomach/Bowel: The stomach and duodenum are within normal limits. Small bowel is unremarkable. Terminal ileum is within normal limits. Ascending and transverse colon are normal. Descending and sigmoid colon are normal. Vascular/Lymphatic: Atherosclerotic calcifications are present in the aorta branch vessels without aneurysm. No significant adenopathy  is present. Reproductive: Prostate is unremarkable. Other: Extensive abdominal ascites is noted. No mass lesion is evident. No significant ventral hernia is present. Musculoskeletal: A remote superior endplate fracture and Schmorl's node at L2. Vertebral body heights are otherwise normal. Alignment is anatomic. Fused anterior osteophytes are present in the thoracic spine. No focal lytic or blastic lesions are present. The SI joints are fused bilaterally. Hips are located and within normal limits bilaterally. IMPRESSION: 1. Extensive abdominal ascites. In the absence of significant pleural effusions or edema, this is most likely hepatic in nature. 2. Diffuse fatty infiltration liver with indistinct margins suggesting cirrhosis. 3. Fusion of the SI joints bilaterally. Question ankylosing spondylitis. 4. Remote superior endplate fracture and Schmorl's node at L2. 5. Aortic Atherosclerosis (ICD10-I70.0). These results were called by  telephone at the time of interpretation on 05/04/2020 at 8:17 am to provider MELANIE BELFI , who verbally acknowledged these results. Electronically Signed   By: San Morelle M.D.   On: 05/04/2020 08:20   DG Chest 2 View  Result Date: 05/03/2020 CLINICAL DATA:  Shortness of breath. EXAM: CHEST - 2 VIEW COMPARISON:  March 13, 2011. FINDINGS: The heart size and mediastinal contours are within normal limits. Both lungs are clear. No pneumothorax or pleural effusion is noted. The visualized skeletal structures are unremarkable. IMPRESSION: No active cardiopulmonary disease. Electronically Signed   By: Marijo Conception M.D.   On: 05/03/2020 12:02   VAS [place] LOWER EXTREMITY VENOUS (DVT)  Result Date: 05/03/2020  Lower Venous DVT Study Indications: Swelling, Edema, Pain, and SOB.  Performing Technologist: Delorise Shiner RVT  Examination Guidelines: A complete evaluation includes B-mode imaging, spectral Doppler, color Doppler, and power Doppler as needed of all accessible portions of  each vessel. Bilateral testing is considered an integral part of a complete examination. Limited examinations for reoccurring indications may be performed as noted. The reflux portion of the exam is performed with the patient in reverse Trendelenburg.  +---------+---------------+---------+-----------+----------+--------------+ RIGHT    CompressibilityPhasicitySpontaneityPropertiesThrombus Aging +---------+---------------+---------+-----------+----------+--------------+ CFV      Full           Yes      Yes                                 +---------+---------------+---------+-----------+----------+--------------+ SFJ      Full           Yes      Yes                                 +---------+---------------+---------+-----------+----------+--------------+ FV Prox  Full           Yes      Yes                                 +---------+---------------+---------+-----------+----------+--------------+ FV Mid   Full           Yes      Yes                                 +---------+---------------+---------+-----------+----------+--------------+ FV DistalFull           Yes      Yes                                 +---------+---------------+---------+-----------+----------+--------------+ PFV      Full                    Yes                                 +---------+---------------+---------+-----------+----------+--------------+ POP      Full           Yes      Yes                                 +---------+---------------+---------+-----------+----------+--------------+  PTV      Full                                                        +---------+---------------+---------+-----------+----------+--------------+ GSV      Full                    Yes                                 +---------+---------------+---------+-----------+----------+--------------+ SSV      Full                    Yes                                  +---------+---------------+---------+-----------+----------+--------------+   +---------+---------------+---------+-----------+----------+--------------+ LEFT     CompressibilityPhasicitySpontaneityPropertiesThrombus Aging +---------+---------------+---------+-----------+----------+--------------+ CFV      Full           Yes      Yes                                 +---------+---------------+---------+-----------+----------+--------------+ SFJ      Full           Yes      Yes                                 +---------+---------------+---------+-----------+----------+--------------+ FV Prox  Full           Yes      Yes                                 +---------+---------------+---------+-----------+----------+--------------+ FV Mid   Full           Yes      Yes                                 +---------+---------------+---------+-----------+----------+--------------+ FV DistalFull           Yes      Yes                                 +---------+---------------+---------+-----------+----------+--------------+ PFV      Full                    Yes                                 +---------+---------------+---------+-----------+----------+--------------+ POP      Full           Yes      Yes                                 +---------+---------------+---------+-----------+----------+--------------+ PTV      Full  Yes                                 +---------+---------------+---------+-----------+----------+--------------+ GSV      Full                    Yes                                 +---------+---------------+---------+-----------+----------+--------------+ SSV      Full                    Yes                                 +---------+---------------+---------+-----------+----------+--------------+     Summary: RIGHT: - There is no evidence of deep vein thrombosis in the lower extremity. - There is no evidence of  superficial venous thrombosis.  - No cystic structure found in the popliteal fossa.  LEFT: - There is no evidence of deep vein thrombosis in the lower extremity. - There is no evidence of superficial venous thrombosis.  - No cystic structure found in the popliteal fossa.  *See table(s) above for measurements and observations. Electronically signed by Servando Snare MD on 05/03/2020 at 11:09:53 AM.    Final     EKG: Independently reviewed.  Normal sinus rhythm 87 bpm with left axis deviation  Assessment/Plan Anasarca secondary to hepatic failure: Acute.  Presents with complaints of progressively worsening swelling of lower extremities and abdomen.  He was recently noted to have elevated liver enzymes.  CT imaging showing concern for extensive ascites with concern for cirrhosis.  Labs significant for creatinine 2.8, sodium 137, total bilirubin 1.9, and INR 1.3.  Meld-Na score 23.  Suspect patient's liver failure most likely secondary to his history of alcohol abuse.  Currently patient still able to make urine, but having acute kidney injury and concern for hepatorenal syndrome which signifies overall poor outcome.   -Admit to medical telemetry bed -Strict I&O's and daily weights -Salt restricted diet -Follow-up hepatitis panel -Check LDH, ammonia, and acetaminophen level -Consult ordered for ultrasound-guided paracentesis (checking pH, albumin, Gram stain, LDH, pro, and culture. -Start Lasix 40 mg p.o. with goal of adding on spironolactone to tolerate -Gastroenterology consulted, will follow-up for any further recommendations  Acute renal failure: Patient presents with creatinine elevated up to 2.8 with BUN 26.  Creatinine 2 days ago was 1.53.  He previously admitted to using ibuprofen 800 mg twice daily, but has not used since December. -Check urinalysis -Check urine creatinine and urine sodium -Hold nephrotoxic agents including beta-blocker in the setting of acute decompensation -Continue to monitor  kidney function daily  Dyspnea on exertion: Acute.  Patient O2 saturations currently maintained on room air.  BNP was mildly elevated at 237.7, but chest x-ray was otherwise clear.  Based on physical exam patient's abdominal distention likely decreasing patient's ability to fully expand lungs leading to his shortness of breath.  Congestive heart failure exacerbation seems less likely. -Incentive spirometry   Lower extremity weakness: Patient reports and unable to get up and walk due to weakness in his legs.  Suspect secondary to significant lower extremity edema and/or related with alcohol abuse. -PT to evaluate and treat in a.m.  Essential hypertension: On admission blood pressures are stable 138/67.  Home  medications include amlodipine 10 mg daily and propranolol 20 mg daily. -Held amlodipine and propranolol due to need for paracentesis and possible fluid shift  Macrocytic anemia: Acute on chronic on admission hemoglobin was 12.2 with MCV 110 and MCH 36.6.  Suspect vitamin B12 and folate deficiency -Add-on vitamin B12 and folate   Osteoarthritis: Acute on chronic. Patient complains of joint pains. -Voltaren gel to knees as needed for pain  Transaminitis and hyperbilirubinemia: Acute.  AST 104, ALT 64, and total bilirubin 1.9.  Symptoms likely related to above.  Elevated AST to ALT ratio significant for alcohol abuse.  -Continue to monitor  Alcohol abuse: Chronic patient reports drinking 3 drinks daily of vodka and tonic water. -CIWA protocol with scheduled Ativan as needed  -Counseled on need of cessation of alcohol abuse  GERD -Pharmacy substitution for Protonix  DVT prophylaxis: SCDs, consider adding on Lovenox if no significant complications noted following paracentesis Code Status: Full Family Communication: No family requested to be updated at this Disposition Plan: Likely discharge home with stable Consults called: GI Admission status: Inpatient require more than 2 midnight  stay  Norval Morton MD [facility]   If 7PM-7AM, please contact night-coverage   05/04/2020, 8:49 AM

## 2020-05-04 NOTE — ED Notes (Signed)
Spoke with Pt's daughters and gave update regarding pt and admitting status. Will continue to monitor pt.

## 2020-05-04 NOTE — ED Notes (Signed)
Ask pt for urine sample, pt stated he did not need to urinate at this time.  

## 2020-05-04 NOTE — ED Notes (Signed)
Pt transported to Ultrasound for Paracentesis

## 2020-05-04 NOTE — Procedures (Signed)
PROCEDURE SUMMARY:  Successful US guided paracentesis from right abdomen.  Yielded 5.2 L of clear yellow fluid.  No immediate complications.  Pt tolerated well.   Specimen sent for labs.  EBL < 2 mL  Theresa Duty, NP 05/04/2020 11:08 AM

## 2020-05-04 NOTE — ED Provider Notes (Signed)
[facility] EMERGENCY DEPARTMENT Provider Note   CSN: [id number] Arrival date & time: 05/03/20  1133     History Chief Complaint  Patient presents with  . Shortness of Breath    Randy Wood is a 80 y.o. male.  Patient is a 80 year old male with a history of hypertension and hyperlipidemia who presents with shortness of breath and weakness.  He reports since September he is felt like his legs have been weaker than normal and he has had some shortness of breath with minimal exertion.  He said his symptoms got worse in January and then over the last few days have been worse.  He says he notes that he feels like his legs are hard to pick up.  He also has had some increased swelling in his lower extremities bilaterally.  He does not have any cough.  No chest pain.  No fevers.  He does note that his abdomen has been more distended but he cannot really tell me when this started.  He does not report any arm weakness.  He has some chronic pain in his lower back but says it is unchanged from his baseline.  He has been seen by his PCP who ordered Doppler ultrasounds of his lower extremities which were negative for DVT.  He was noted to have a slight increase in his BNP and there was some concern for congestive heart failure.  He also had a neurology consult in December with the leg weakness and at that point did not have concerns for etiologies such as Guillain-Barr or compressive lesion.  He denies any numbness in his legs but says he does have pain in both of his legs which he has been using Voltaren cream for.        Past Medical History:  Diagnosis Date  . Arthritis   . Hyperlipemia   . Hypertension   . Macular degeneration, wet [facility])     Patient Active Problem List   Diagnosis Date Noted  . Acne rosacea 03/20/2020  . Adenomatous polyp of colon 03/20/2020  . Body mass index (BMI) 31.0-31.9, adult 03/20/2020  . 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  Erectile dysfunction 03/20/2020  . Esophageal stricture 03/20/2020  . Essential hypertension 03/20/2020  . Essential tremor 03/20/2020  . Gait abnormality 03/20/2020  . Gastro-esophageal reflux disease without esophagitis 03/20/2020  . 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 Hypercholesterolemia 03/20/2020  . Irritable bowel syndrome 03/20/2020  . Male hypogonadism 03/20/2020  . Osteoarthritis of knee 03/20/2020  . Perennial allergic rhinitis 03/20/2020  . Vasomotor rhinitis 03/20/2020    Past Surgical History:  Procedure Laterality Date  . ANKLE SURGERY    . CATARACT EXTRACTION, BILATERAL    . TONSILLECTOMY         Family History  Problem Relation Age of Onset  . Colon cancer Mother 61  . Ovarian cancer Mother   . Prostate cancer Father   . Healthy Brother   . Breast cancer Daughter        x1 oldest daughter  . Hashimoto's thyroiditis Daughter        youngest daughter    Social History   Tobacco Use  . Smoking status: Former Smoker    Quit date: 1974    Years since quitting: 48.1  . Smokeless tobacco: Never Used  Vaping Use  . Vaping Use: Never used  Substance Use Topics  . Alcohol use: Yes    Alcohol/week: 3.0 standard drinks  Types: 3 Glasses of wine per week    Comment: 2-3 drinks/night  . Drug use: Never    Home Medications Prior to Admission medications   Medication Sig Start Date End Date Taking? Authorizing Provider  amLODipine (NORVASC) 10 MG tablet Take 10 mg by mouth daily.   Yes [provider]  brimonidine (ALPHAGAN) 0.2 % ophthalmic solution Place 1 drop into the left eye in the morning and at bedtime. 01/09/19  Yes [provider]  dorzolamide-timolol (COSOPT) 22.3-6.8 MG/ML ophthalmic solution Place 1 drop into the left eye 2 (two) times daily. 01/16/19  Yes [provider]  EPINEPHrine 0.3 mg/0.3 mL IJ SOAJ injection Inject 0.3 mg into the muscle as needed for anaphylaxis.   Yes [provider]  fluticasone  (FLONASE) 50 MCG/ACT nasal spray Place 1 spray into both nostrils daily.   Yes [provider]  GLUCOSAMINE-CHONDROITIN PO Take 1 capsule by mouth daily.   Yes [provider]  Multiple Vitamins-Minerals (ONE-A-DAY MENS 50+) TABS Take 1 tablet by mouth daily.   Yes [provider]  Multiple Vitamins-Minerals (PRESERVISION AREDS 2 PO) Take 1 tablet by mouth daily.   Yes [provider]  omeprazole (PRILOSEC) 20 MG capsule Take 20 mg by mouth daily.   Yes [provider]  propranolol (INDERAL) 20 MG tablet Take 1 tablet (20 mg total) by mouth daily. 03/20/20  Yes Hilts, [name], MD    Allergies    Lisinopril  Review of Systems   Review of Systems  Constitutional: Positive for fatigue. Negative for chills, diaphoresis and fever.  HENT: Negative for congestion, rhinorrhea and sneezing.   Eyes: Negative.   Respiratory: Positive for shortness of breath. Negative for cough and chest tightness.   Cardiovascular: Positive for leg swelling. Negative for chest pain.  Gastrointestinal: Positive for abdominal distention. Negative for abdominal pain, blood in stool, diarrhea, nausea and vomiting.  Genitourinary: Negative for difficulty urinating, flank pain, frequency and hematuria.  Musculoskeletal: Negative for arthralgias and back pain.  Skin: Negative for rash.  Neurological: Positive for weakness. Negative for dizziness, speech difficulty, numbness and headaches.    Physical Exam Updated Vital Signs BP 124/61   Pulse 93   Temp 97.9 F (36.6 C) (Oral)   Resp 19   Ht 5\' 11"  (1.803 m)   Wt 100.7 kg   SpO2 100%   BMI 30.96 kg/m   Physical Exam Constitutional:      Appearance: He is well-developed and well-nourished.  HENT:     Head: Normocephalic and atraumatic.  Eyes:     Pupils: Pupils are equal, round, and reactive to light.  Cardiovascular:     Rate and Rhythm: Normal rate and regular rhythm.     Heart sounds: Normal heart sounds.   Pulmonary:     Effort: Pulmonary effort is normal. Tachypnea (Mild tachypnea) present. No respiratory distress.     Breath sounds: Normal breath sounds. No wheezing or rales.  Chest:     Chest wall: No tenderness.  Abdominal:     General: Bowel sounds are normal.     Palpations: Abdomen is soft.     Tenderness: There is no abdominal tenderness. There is no guarding or rebound.     Comments: Abdomen is distended and mildly tympanic to percussion  Musculoskeletal:        General: Normal range of motion.     Cervical back: Normal range of motion and neck supple.     Right lower leg: Edema present.  Left lower leg: Edema present.     Comments: 3-4+ edema to the lower extremities bilaterally.  Extends up to the mid thighs.    Lymphadenopathy:     Cervical: No cervical adenopathy.  Skin:    General: Skin is warm and dry.     Findings: No rash.  Neurological:     Mental Status: He is alert and oriented to person, place, and time.     Comments: Motor 4 out of 5 to lower extremities bilaterally can of 5 out of 5 to the upper extremities, cranial nerves II through XII grossly intact  Psychiatric:        Mood and Affect: Mood and affect normal.     ED Results / Procedures / Treatments   Labs (all labs ordered are listed, but only abnormal results are displayed) Labs Reviewed  BASIC METABOLIC PANEL - Abnormal; Notable for the following components:      Result Value   CO2 19 (*)    Glucose, Bld 151 (*)    BUN 26 (*)    Creatinine, Ser 2.80 (*)    Calcium 8.5 (*)    GFR, Estimated 22 (*)    Anion gap 17 (*)    All other components within normal limits  CBC - Abnormal; Notable for the following components:   RBC 3.33 (*)    Hemoglobin 12.2 (*)    HCT 36.7 (*)    MCV 110.2 (*)    MCH 36.6 (*)    All other components within normal limits  BRAIN NATRIURETIC PEPTIDE - Abnormal; Notable for the following components:   B Natriuretic Peptide 237.7 (*)    All other components within  normal limits  SARS CORONAVIRUS 2 BY RT PCR (HOSPITAL ORDER, [place] [facility])  HEPATIC FUNCTION PANEL  HEPATITIS PANEL, ACUTE  PROTIME-INR    EKG EKG Interpretation  Date/Time:  Friday May 03 2020 11:39:36 EST Ventricular Rate:  87 PR Interval:  154 QRS Duration: 98 QT Interval:  402 QTC Calculation: 483 R Axis:   -60 Text Interpretation: Normal sinus rhythm Left axis deviation Inferior infarct , age undetermined Possible Anterolateral infarct , age undetermined Abnormal ECG No acute changes Confirmed by Addison Lank [phone]) on 05/04/2020 6:28:08 AM Also confirmed by Malvin Johns [phone])  on 05/04/2020 7:08:20 AM   Radiology CT Abdomen Pelvis Wo Contrast  Result Date: 05/04/2020 CLINICAL DATA:  Abdominal distension. Intermittent abdominal pain. New onset congestive heart failure. EXAM: CT ABDOMEN AND PELVIS WITHOUT CONTRAST TECHNIQUE: Multidetector CT imaging of the abdomen and pelvis was performed following the standard protocol without IV contrast. COMPARISON:  None. FINDINGS: Lower chest: Minimal atelectasis present at the right base. Lungs are otherwise clear. Atherosclerotic calcifications are present in the descending thoracic aorta. Coronary artery calcifications are present. Heart size is normal. No significant pericardial effusion is present. Hepatobiliary: Diffuse fatty infiltration liver is present. Abdominal ascites surround the liver. Liver margin is indistinct. No discrete mass lesion is present. Common bile duct and gallbladder are within normal limits. Pancreas: Unremarkable. No pancreatic ductal dilatation or surrounding inflammatory changes. Spleen: Normal in size without focal abnormality. Adrenals/Urinary Tract: Adrenal glands are normal bilaterally. Kidneys and ureters are unremarkable. No stone or mass lesion is present. No obstruction is present. The urinary bladder is within normal limits. Stomach/Bowel: The stomach and duodenum are within  normal limits. Small bowel is unremarkable. Terminal ileum is within normal limits. Ascending and transverse colon are normal. Descending and sigmoid colon are normal.  Vascular/Lymphatic: Atherosclerotic calcifications are present in the aorta branch vessels without aneurysm. No significant adenopathy is present. Reproductive: Prostate is unremarkable. Other: Extensive abdominal ascites is noted. No mass lesion is evident. No significant ventral hernia is present. Musculoskeletal: A remote superior endplate fracture and Schmorl's node at L2. Vertebral body heights are otherwise normal. Alignment is anatomic. Fused anterior osteophytes are present in the thoracic spine. No focal lytic or blastic lesions are present. The SI joints are fused bilaterally. Hips are located and within normal limits bilaterally. IMPRESSION: 1. Extensive abdominal ascites. In the absence of significant pleural effusions or edema, this is most likely hepatic in nature. 2. Diffuse fatty infiltration liver with indistinct margins suggesting cirrhosis. 3. Fusion of the SI joints bilaterally. Question ankylosing spondylitis. 4. Remote superior endplate fracture and Schmorl's node at L2. 5. Aortic Atherosclerosis (ICD10-I70.0). These results were called by telephone at the time of interpretation on 05/04/2020 at 8:17 am to provider Walker Sitar , who verbally acknowledged these results. Electronically Signed   By: San Morelle M.D.   On: 05/04/2020 08:20   DG Chest 2 View  Result Date: 05/03/2020 CLINICAL DATA:  Shortness of breath. EXAM: CHEST - 2 VIEW COMPARISON:  March 13, 2011. FINDINGS: The heart size and mediastinal contours are within normal limits. Both lungs are clear. No pneumothorax or pleural effusion is noted. The visualized skeletal structures are unremarkable. IMPRESSION: No active cardiopulmonary disease. Electronically Signed   By: Marijo Conception M.D.   On: 05/03/2020 12:02   VAS [place] LOWER EXTREMITY VENOUS  (DVT)  Result Date: 05/03/2020  Lower Venous DVT Study Indications: Swelling, Edema, Pain, and SOB.  Performing Technologist: Delorise Shiner RVT  Examination Guidelines: A complete evaluation includes B-mode imaging, spectral Doppler, color Doppler, and power Doppler as needed of all accessible portions of each vessel. Bilateral testing is considered an integral part of a complete examination. Limited examinations for reoccurring indications may be performed as noted. The reflux portion of the exam is performed with the patient in reverse Trendelenburg.  +---------+---------------+---------+-----------+----------+--------------+ RIGHT    CompressibilityPhasicitySpontaneityPropertiesThrombus Aging +---------+---------------+---------+-----------+----------+--------------+ CFV      Full           Yes      Yes                                 +---------+---------------+---------+-----------+----------+--------------+ SFJ      Full           Yes      Yes                                 +---------+---------------+---------+-----------+----------+--------------+ FV Prox  Full           Yes      Yes                                 +---------+---------------+---------+-----------+----------+--------------+ FV Mid   Full           Yes      Yes                                 +---------+---------------+---------+-----------+----------+--------------+ FV DistalFull           Yes      Yes                                 +---------+---------------+---------+-----------+----------+--------------+  PFV      Full                    Yes                                 +---------+---------------+---------+-----------+----------+--------------+ POP      Full           Yes      Yes                                 +---------+---------------+---------+-----------+----------+--------------+ PTV      Full                                                         +---------+---------------+---------+-----------+----------+--------------+ GSV      Full                    Yes                                 +---------+---------------+---------+-----------+----------+--------------+ SSV      Full                    Yes                                 +---------+---------------+---------+-----------+----------+--------------+   +---------+---------------+---------+-----------+----------+--------------+ LEFT     CompressibilityPhasicitySpontaneityPropertiesThrombus Aging +---------+---------------+---------+-----------+----------+--------------+ CFV      Full           Yes      Yes                                 +---------+---------------+---------+-----------+----------+--------------+ SFJ      Full           Yes      Yes                                 +---------+---------------+---------+-----------+----------+--------------+ FV Prox  Full           Yes      Yes                                 +---------+---------------+---------+-----------+----------+--------------+ FV Mid   Full           Yes      Yes                                 +---------+---------------+---------+-----------+----------+--------------+ FV DistalFull           Yes      Yes                                 +---------+---------------+---------+-----------+----------+--------------+ PFV      Full  Yes                                 +---------+---------------+---------+-----------+----------+--------------+ POP      Full           Yes      Yes                                 +---------+---------------+---------+-----------+----------+--------------+ PTV      Full                    Yes                                 +---------+---------------+---------+-----------+----------+--------------+ GSV      Full                    Yes                                  +---------+---------------+---------+-----------+----------+--------------+ SSV      Full                    Yes                                 +---------+---------------+---------+-----------+----------+--------------+     Summary: RIGHT: - There is no evidence of deep vein thrombosis in the lower extremity. - There is no evidence of superficial venous thrombosis.  - No cystic structure found in the popliteal fossa.  LEFT: - There is no evidence of deep vein thrombosis in the lower extremity. - There is no evidence of superficial venous thrombosis.  - No cystic structure found in the popliteal fossa.  *See table(s) above for measurements and observations. Electronically signed by Servando Snare MD on 05/03/2020 at 11:09:53 AM.    Final     Procedures Procedures   Medications Ordered in ED Medications  sodium chloride 0.9 % bolus 500 mL (0 mLs Intravenous Stopped 05/04/20 0856)    ED Course  I have reviewed the triage vital signs and the nursing notes.  Pertinent labs & imaging results that were available during my care of the patient were reviewed by me and considered in my medical decision making (see chart for details).    MDM Rules/Calculators/A&P                          Patient is a 80 year old male who presents with shortness of breath.  His chest x-ray is clear.  I do not hear any rales on exam.  He does not have any evidence of pneumonia.  He had some mild hypoxia on ambulation but none at rest.  No associated symptoms that would be more concerning for ACS.  He does have some lower leg edema and a large protruding abdomen.  CT scan was performed of the abdomen pelvis which showed a large amount of ascites and fatty liver.  His LFTs done from 2 days ago were elevated.  They are pending today.  I also added a hepatitis panel.  He does admit to drinking about 2-3 alcoholic drinks per night.  No history of known  liver problems.  His BNP is mildly elevated.  His creatinine is elevated from  his prior values.  I feel that the ascites may be leading to his increased shortness of breath and potentially his leg edema.  He may need a paracentesis.  I spoke with Dr. Tamala Julian with the hospitalist service to admit the patient for further treatment. Final Clinical Impression(s) / ED Diagnoses Final diagnoses:  SOB (shortness of breath)  Other ascites    Rx / DC Orders ED Discharge Orders    None       Malvin Johns, MD 05/04/20 [phone]

## 2020-05-04 NOTE — ED Notes (Signed)
Patient transported to CT 

## 2020-05-04 NOTE — Consult Note (Signed)
Referring Provider: [facility] Primary Care Physician:  Eunice Blase, MD Primary Gastroenterologist: [facility] (formerly seen by Dr. Teena Irani)  Reason for Consultation: Decompensated liver disease  HPI: Randy Wood is a 80 y.o. male with alcohol abuse but no prior overt liver related problems, presents to the emergency room with recently noted fluid accumulation (severe lower extremity edema) and abdominal distention.    Doppler ultrasound of lower extremities through his PCP showed no evidence of DVT, whereas CT in the emergency room shows significant ascites with hepatic steatosis, indistinct margins of the liver suggesting possible cirrhosis, but no varices or splenomegaly.    Labs show changes consistent with alcoholic liver disease including bilirubin 3.7, AST 104, ALT 64, albumin 2.7, but normal platelet count.    Of concern is new onset azotemia, with creatinine 2.8 as compared to a baseline of 0.84 in October 2020 and a more recent value of 1.53 on February second, 2022.  The patient indicates to me that he has about 3 drinks per night.  He has no prior history of any known liver disease.   Past Medical History:  Diagnosis Date  . Arthritis   . Hyperlipemia   . Hypertension   . Macular degeneration, wet ([place])     Past Surgical History:  Procedure Laterality Date  . ANKLE SURGERY    . CATARACT EXTRACTION, BILATERAL    . TONSILLECTOMY      Prior to Admission medications   Medication Sig Start Date End Date Taking? Authorizing Provider  amLODipine (NORVASC) 10 MG tablet Take 10 mg by mouth daily.   Yes [provider]  brimonidine (ALPHAGAN) 0.2 % ophthalmic solution Place 1 drop into the left eye in the morning and at bedtime. 01/09/19  Yes [provider]  dorzolamide-timolol (COSOPT) 22.3-6.8 MG/ML ophthalmic solution Place 1 drop into the left eye 2 (two) times daily. 01/16/19  Yes [provider]  EPINEPHrine 0.3  mg/0.3 mL IJ SOAJ injection Inject 0.3 mg into the muscle as needed for anaphylaxis.   Yes [provider]  fluticasone (FLONASE) 50 MCG/ACT nasal spray Place 1 spray into both nostrils daily.   Yes [provider]  GLUCOSAMINE-CHONDROITIN PO Take 1 capsule by mouth daily.   Yes [provider]  Multiple Vitamins-Minerals (ONE-A-DAY MENS 50+) TABS Take 1 tablet by mouth daily.   Yes [provider]  Multiple Vitamins-Minerals (PRESERVISION AREDS 2 PO) Take 1 tablet by mouth daily.   Yes [provider]  omeprazole (PRILOSEC) 20 MG capsule Take 20 mg by mouth daily.   Yes [provider]  propranolol (INDERAL) 20 MG tablet Take 1 tablet (20 mg total) by mouth daily. 03/20/20  Yes Hilts, [name], MD    Current Facility-Administered Medications  Medication Dose Route Frequency Provider Last Rate Last Admin  . albuterol (PROVENTIL) (2.5 MG/3ML) 0.083% nebulizer solution 2.5 mg  2.5 mg Nebulization Q6H PRN Smith, Rondell A, MD      . brimonidine (ALPHAGAN) 0.2 % ophthalmic solution 1 drop  1 drop Left Eye BID Smith, Rondell A, MD      . diclofenac Sodium (VOLTAREN) 1 % topical gel 2 g  2 g Topical BID Smith, Rondell A, MD      . dorzolamide-timolol (COSOPT) 22.3-6.8 MG/ML ophthalmic solution 1 drop  1 drop Left Eye BID Smith, Rondell A, MD      . fluticasone (FLONASE) 50 MCG/ACT nasal spray 1 spray  1 spray Each Nare Daily Norval Morton, MD      .  folic acid (FOLVITE) tablet 1 mg  1 mg Oral Daily Smith, Rondell A, MD      . furosemide (LASIX) tablet 40 mg  40 mg Oral Daily Smith, Rondell A, MD      . lidocaine (PF) (XYLOCAINE) 1 % injection           . LORazepam (ATIVAN) injection 0-4 mg  0-4 mg Intravenous Q6H Smith, Rondell A, MD       Followed by  . [START ON 05/06/2020] LORazepam (ATIVAN) injection 0-4 mg  0-4 mg Intravenous Q12H Smith, Rondell A, MD      . LORazepam (ATIVAN) tablet 1-4 mg  1-4 mg Oral Q1H PRN Norval Morton, MD       Or   . LORazepam (ATIVAN) injection 1-4 mg  1-4 mg Intravenous Q1H PRN Fuller Plan A, MD      . multivitamin with minerals tablet 1 tablet  1 tablet Oral Daily Smith, Rondell A, MD      . sodium chloride flush (NS) 0.9 % injection 3 mL  3 mL Intravenous Q12H Smith, Rondell A, MD      . thiamine tablet 100 mg  100 mg Oral Daily Smith, Rondell A, MD       Or  . thiamine (B-1) injection 100 mg  100 mg Intravenous Daily Norval Morton, MD       Current Outpatient Medications  Medication Sig Dispense Refill  . amLODipine (NORVASC) 10 MG tablet Take 10 mg by mouth daily.    . brimonidine (ALPHAGAN) 0.2 % ophthalmic solution Place 1 drop into the left eye in the morning and at bedtime.    . dorzolamide-timolol (COSOPT) 22.3-6.8 MG/ML ophthalmic solution Place 1 drop into the left eye 2 (two) times daily.    Marland Kitchen EPINEPHrine 0.3 mg/0.3 mL IJ SOAJ injection Inject 0.3 mg into the muscle as needed for anaphylaxis.    . fluticasone (FLONASE) 50 MCG/ACT nasal spray Place 1 spray into both nostrils daily.    Marland Kitchen GLUCOSAMINE-CHONDROITIN PO Take 1 capsule by mouth daily.    . Multiple Vitamins-Minerals (ONE-A-DAY MENS 50+) TABS Take 1 tablet by mouth daily.    . Multiple Vitamins-Minerals (PRESERVISION AREDS 2 PO) Take 1 tablet by mouth daily.    Marland Kitchen omeprazole (PRILOSEC) 20 MG capsule Take 20 mg by mouth daily.    . propranolol (INDERAL) 20 MG tablet Take 1 tablet (20 mg total) by mouth daily. 90 tablet 3    Allergies as of 05/03/2020 - Review Complete 05/03/2020  Allergen Reaction Noted  . Lisinopril Other (See Comments) 11/15/2019    Family History  Problem Relation Age of Onset  . Colon cancer Mother 44  . Ovarian cancer Mother   . Prostate cancer Father   . Healthy Brother   . Breast cancer Daughter        x1 oldest daughter  . Hashimoto's thyroiditis Daughter        youngest daughter    Social History   Socioeconomic History  . Marital status: Widowed    Spouse name: Not on file  .  Number of children: 2  . Years of education: Not on file  . Highest education level: Bachelor's degree (e.g., BA, AB, BS)  Occupational History  . Occupation: retired  Tobacco Use  . Smoking status: Former Smoker    Quit date: 1974    Years since quitting: 48.1  . Smokeless tobacco: Never Used  Vaping Use  . Vaping Use: Never used  Substance  and Sexual Activity  . Alcohol use: Yes    Alcohol/week: 3.0 standard drinks    Types: 3 Glasses of wine per week    Comment: 2-3 drinks/night  . Drug use: Never  . Sexual activity: Not on file  Other Topics Concern  . Not on file  Social History Narrative   Widowed   2 daughter   Right handed   Retired   Drinks coffee 1 cup a day, NO Tea NO soda   Social Determinants of Radio broadcast assistant Strain: Not on file  Food Insecurity: Not on file  Transportation Needs: Not on file  Physical Activity: Not on file  Stress: Not on file  Social Connections: Not on file  Intimate Partner Violence: Not on file    Review of Systems: He has heartburn which is well controlled on omeprazole, no dysphagia or anorexia.  He was having abdominal tightness" which is somewhat relieved after his 5 L paracentesis, no frank abdominal pain.  He has a history of IBS, his abdomen is somewhat on the constipated side recently most recent colonoscopy January 2014 by Dr. Teena Irani showed a solitary diminutive polyp).  Physical Exam: Vital signs in last 24 hours: Temp:  [97.9 F (36.6 C)-98.4 F (36.9 C)] 97.9 F (36.6 C) (02/05 0007) Pulse Rate:  [80-99] 89 (02/05 1200) Resp:  [16-24] 17 (02/05 1200) BP: (112-151)/(48-72) 121/48 (02/05 1200) SpO2:  [97 %-100 %] 99 % (02/05 1200) Weight:  [100.7 kg] 100.7 kg (02/05 0726)   General:   Alert,  Well-developed, well-nourished, pleasant and cooperative in NAD.  Looks younger than his age. Head:  Normocephalic and atraumatic. Eyes:  Sclera clear, no icterus.   Lungs:  Clear anteriorly to auscultation.    No wheezes, crackles, or rhonchi. No evident respiratory distress. Heart:   Regular rate and rhythm; no murmurs, clicks, rubs,  or gallops. Abdomen: Moderately distended without obvious hepatosplenomegaly, mass-effect or tenderness. Msk:   Symmetrical without gross deformities. Extremities:   Severe symmetrical bilateral pitting edema, 3-4+.  No clubbing or cyanosis. Neurologic:  Alert and coherent;  grossly normal neurologically. Skin:  Intact without significant lesions or rashes.  No palmar erythema or spider angiomata. Psych:   Alert and cooperative. Normal mood and affect.  Intake/Output from previous day: No intake/output data recorded. Intake/Output this shift: Total I/O In: 500 [IV Piggyback:500] Out: -   Lab Results: Recent Labs    05/01/20 1348 05/03/20 1152  WBC 8.6 7.3  HGB 12.1* 12.2*  HCT 35.2* 36.7*  PLT 208 182   BMET Recent Labs    05/01/20 1348 05/03/20 1152  NA 139 137  K 4.2 3.9  CL 100 101  CO2 20 19*  GLUCOSE 98 151*  BUN 21 26*  CREATININE 1.53* 2.80*  CALCIUM 8.8 8.5*   LFT Recent Labs    05/04/20 0753  PROT 6.3*  ALBUMIN 2.7*  AST 104*  ALT 64*  ALKPHOS 212*  BILITOT 3.7*  BILIDIR 1.8*  IBILI 1.9*   PT/INR Recent Labs    05/04/20 0902  LABPROT 15.4*  INR 1.3*    Studies/Results: CT Abdomen Pelvis Wo Contrast  Result Date: 05/04/2020 CLINICAL DATA:  Abdominal distension. Intermittent abdominal pain. New onset congestive heart failure. EXAM: CT ABDOMEN AND PELVIS WITHOUT CONTRAST TECHNIQUE: Multidetector CT imaging of the abdomen and pelvis was performed following the standard protocol without IV contrast. COMPARISON:  None. FINDINGS: Lower chest: Minimal atelectasis present at the right base. Lungs are otherwise clear. Atherosclerotic  calcifications are present in the descending thoracic aorta. Coronary artery calcifications are present. Heart size is normal. No significant pericardial effusion is present. Hepatobiliary: Diffuse  fatty infiltration liver is present. Abdominal ascites surround the liver. Liver margin is indistinct. No discrete mass lesion is present. Common bile duct and gallbladder are within normal limits. Pancreas: Unremarkable. No pancreatic ductal dilatation or surrounding inflammatory changes. Spleen: Normal in size without focal abnormality. Adrenals/Urinary Tract: Adrenal glands are normal bilaterally. Kidneys and ureters are unremarkable. No stone or mass lesion is present. No obstruction is present. The urinary bladder is within normal limits. Stomach/Bowel: The stomach and duodenum are within normal limits. Small bowel is unremarkable. Terminal ileum is within normal limits. Ascending and transverse colon are normal. Descending and sigmoid colon are normal. Vascular/Lymphatic: Atherosclerotic calcifications are present in the aorta branch vessels without aneurysm. No significant adenopathy is present. Reproductive: Prostate is unremarkable. Other: Extensive abdominal ascites is noted. No mass lesion is evident. No significant ventral hernia is present. Musculoskeletal: A remote superior endplate fracture and Schmorl's node at L2. Vertebral body heights are otherwise normal. Alignment is anatomic. Fused anterior osteophytes are present in the thoracic spine. No focal lytic or blastic lesions are present. The SI joints are fused bilaterally. Hips are located and within normal limits bilaterally. IMPRESSION: 1. Extensive abdominal ascites. In the absence of significant pleural effusions or edema, this is most likely hepatic in nature. 2. Diffuse fatty infiltration liver with indistinct margins suggesting cirrhosis. 3. Fusion of the SI joints bilaterally. Question ankylosing spondylitis. 4. Remote superior endplate fracture and Schmorl's node at L2. 5. Aortic Atherosclerosis (ICD10-I70.0). These results were called by telephone at the time of interpretation on 05/04/2020 at 8:17 am to provider MELANIE BELFI , who  verbally acknowledged these results. Electronically Signed   By: San Morelle M.D.   On: 05/04/2020 08:20   DG Chest 2 View  Result Date: 05/03/2020 CLINICAL DATA:  Shortness of breath. EXAM: CHEST - 2 VIEW COMPARISON:  March 13, 2011. FINDINGS: The heart size and mediastinal contours are within normal limits. Both lungs are clear. No pneumothorax or pleural effusion is noted. The visualized skeletal structures are unremarkable. IMPRESSION: No active cardiopulmonary disease. Electronically Signed   By: Marijo Conception M.D.   On: 05/03/2020 12:02   US Paracentesis  Result Date: 05/04/2020 INDICATION: Patient with elevated LFTs presents today with large volume ascites. Interventional radiology asked to perform a therapeutic and diagnostic paracentesis. EXAM: ULTRASOUND GUIDED PARACENTESIS MEDICATIONS: 1% lidocaine 10 mL COMPLICATIONS: None immediate. PROCEDURE: Informed written consent was obtained from the patient after a discussion of the risks, benefits and alternatives to treatment. A timeout was performed prior to the initiation of the procedure. Initial ultrasound scanning demonstrates a large amount of ascites within the right lower abdominal quadrant. The right lower abdomen was prepped and draped in the usual sterile fashion. 1% lidocaine was used for local anesthesia. Following this, a 19 gauge, 7-cm, Yueh catheter was introduced. An ultrasound image was saved for documentation purposes. The paracentesis was performed. The catheter was removed and a dressing was applied. The patient tolerated the procedure well without immediate post procedural complication. FINDINGS: A total of approximately 5.2 L of clear yellow fluid was removed. Samples were sent to the laboratory as requested by the clinical team. IMPRESSION: Successful ultrasound-guided paracentesis yielding 5.2 liters of peritoneal fluid. Read by: Soyla Dryer, NP Electronically Signed   By: Sandi Mariscal M.D.   On: 05/04/2020 11:08    VAS [place]  LOWER EXTREMITY VENOUS (DVT)  Result Date: 05/03/2020  Lower Venous DVT Study Indications: Swelling, Edema, Pain, and SOB.  Performing Technologist: Delorise Shiner RVT  Examination Guidelines: A complete evaluation includes B-mode imaging, spectral Doppler, color Doppler, and power Doppler as needed of all accessible portions of each vessel. Bilateral testing is considered an integral part of a complete examination. Limited examinations for reoccurring indications may be performed as noted. The reflux portion of the exam is performed with the patient in reverse Trendelenburg.  +---------+---------------+---------+-----------+----------+--------------+ RIGHT    CompressibilityPhasicitySpontaneityPropertiesThrombus Aging +---------+---------------+---------+-----------+----------+--------------+ CFV      Full           Yes      Yes                                 +---------+---------------+---------+-----------+----------+--------------+ SFJ      Full           Yes      Yes                                 +---------+---------------+---------+-----------+----------+--------------+ FV Prox  Full           Yes      Yes                                 +---------+---------------+---------+-----------+----------+--------------+ FV Mid   Full           Yes      Yes                                 +---------+---------------+---------+-----------+----------+--------------+ FV DistalFull           Yes      Yes                                 +---------+---------------+---------+-----------+----------+--------------+ PFV      Full                    Yes                                 +---------+---------------+---------+-----------+----------+--------------+ POP      Full           Yes      Yes                                 +---------+---------------+---------+-----------+----------+--------------+ PTV      Full                                                         +---------+---------------+---------+-----------+----------+--------------+ GSV      Full                    Yes                                 +---------+---------------+---------+-----------+----------+--------------+  SSV      Full                    Yes                                 +---------+---------------+---------+-----------+----------+--------------+   +---------+---------------+---------+-----------+----------+--------------+ LEFT     CompressibilityPhasicitySpontaneityPropertiesThrombus Aging +---------+---------------+---------+-----------+----------+--------------+ CFV      Full           Yes      Yes                                 +---------+---------------+---------+-----------+----------+--------------+ SFJ      Full           Yes      Yes                                 +---------+---------------+---------+-----------+----------+--------------+ FV Prox  Full           Yes      Yes                                 +---------+---------------+---------+-----------+----------+--------------+ FV Mid   Full           Yes      Yes                                 +---------+---------------+---------+-----------+----------+--------------+ FV DistalFull           Yes      Yes                                 +---------+---------------+---------+-----------+----------+--------------+ PFV      Full                    Yes                                 +---------+---------------+---------+-----------+----------+--------------+ POP      Full           Yes      Yes                                 +---------+---------------+---------+-----------+----------+--------------+ PTV      Full                    Yes                                 +---------+---------------+---------+-----------+----------+--------------+ GSV      Full                    Yes                                  +---------+---------------+---------+-----------+----------+--------------+ SSV      Full  Yes                                 +---------+---------------+---------+-----------+----------+--------------+     Summary: RIGHT: - There is no evidence of deep vein thrombosis in the lower extremity. - There is no evidence of superficial venous thrombosis.  - No cystic structure found in the popliteal fossa.  LEFT: - There is no evidence of deep vein thrombosis in the lower extremity. - There is no evidence of superficial venous thrombosis.  - No cystic structure found in the popliteal fossa.  *See table(s) above for measurements and observations. Electronically signed by Servando Snare MD on 05/03/2020 at 11:09:53 AM.    Final     Impression: 1.  Decompensated liver disease, manifesting primarily as fluid retention.  Given the severe ascites, it is hard to determine how much is due to fixed fibrotic liver disease (cirrhosis) versus alcoholic hepatitis with transient portal hypertension related to hepatocellular edema. 2.  Biochemical evidence for alcoholic hepatitis, without evidence of encephalopathy 3.  Significant azotemia, possibly due to an element of hepatorenal syndrome (urinary sodium pending at present)  Plan: 1.  Be very cautious with use of diuretics in the setting of worsening azotemia.  Since the patient has significant peripheral edema, we might be able to get away with low-dose diuretic therapy but I would stop the furosemide if tomorrow's renal function shows further worsening.  Agree with low-sodium diet and daily weights.  Would consider nephrology consultation.  2.  I would use albumin infusion following any further paracenteses, to help maintain renal function in the face of fluid shifts that can sometimes occur following paracentesis.  3.  No current indication for prednisolone, in the absence of encephalopathy.  We will follow with you.    LOS: 0 days   Youlanda Mighty Mayur Duman  05/04/2020, 12:48 PM   Pager [phone] If no answer or after 5 PM call [phone]

## 2020-05-04 NOTE — Consult Note (Signed)
Reason for Consult: Acute kidney injury Referring Physician: Fuller Plan, MD [facility])  HPI:  80 year old Caucasian man with past medical history significant for hypertension, dyslipidemia and longstanding alcohol use (3 drinks a night for the past several years) who presented to the emergency room with increasing abdominal girth and leg swelling/leg weakness for the past few months.  He reports associated shortness of breath with exertion and abdominal distention that makes it difficult for him to lie flat in bed.  He recently had venous Dopplers that were negative for DVT and was noted to have decreasing urine output/dark urine with worsening kidney function for which she was asked to come to the emergency room by his primary care provider.  He reports of no prior history of acute kidney injury and any strong family history of renal disease/autoimmune disorders.  He had been taking ibuprofen 800 mg twice a day up until about mid December for lower extremity pain.  Upon review of his labs, creatinine in October, 2020 was 0.8, on 05/01/2020 it was 1.5 and when checked yesterday, creatinine was up to 2.8.  There is no creatinine available from today.  Additional work-up in the emergency room shows an albumin of 2.7, AST 104, ALT 64, total bilirubin 3.7 (indirect 1.9) with CT scan of the abdomen and pelvis showing extensive ascites with diffuse fatty infiltration of the liver and indistinct margins suggestive of cirrhosis.  He earlier had 5.2 L paracentesis.  During parts of obtaining the history and review of systems, it is apparent that he has occasional confusion/disorientation.  He is aware of the gravity of his situation and informs me that his brother who was a healthcare provider has already explained this to him.  He is widowed and has 2 daughters-1 in [place], [place] (his main [place]) and one in [place], [place].  Past Medical History:  Diagnosis Date  . Arthritis   . Hyperlipemia   . Hypertension   .  Macular degeneration, wet ([place])     Past Surgical History:  Procedure Laterality Date  . ANKLE SURGERY    . CATARACT EXTRACTION, BILATERAL    . TONSILLECTOMY      Family History  Problem Relation Age of Onset  . Colon cancer Mother 59  . Ovarian cancer Mother   . Prostate cancer Father   . Healthy Brother   . Breast cancer Daughter        x1 oldest daughter  . Hashimoto's thyroiditis Daughter        youngest daughter    Social History:  reports that he quit smoking about 48 years ago. He has never used smokeless tobacco. He reports current alcohol use of about 3.0 standard drinks of alcohol per week. He reports that he does not use drugs.  Allergies:  Allergies  Allergen Reactions  . Lisinopril Other (See Comments)    Medications:  Scheduled: . brimonidine  1 drop Left Eye BID  . diclofenac Sodium  2 g Topical BID  . dorzolamide-timolol  1 drop Left Eye BID  . fluticasone  1 spray Each Nare Daily  . folic acid  1 mg Oral Daily  . lidocaine (PF)      . LORazepam  0-4 mg Intravenous Q6H   Followed by  . [START ON 05/06/2020] LORazepam  0-4 mg Intravenous Q12H  . multivitamin with minerals  1 tablet Oral Daily  . sodium chloride flush  3 mL Intravenous Q12H  . thiamine  100 mg Oral Daily   Or  .  thiamine  100 mg Intravenous Daily    BMP Latest Ref Rng & Units 05/03/2020 05/01/2020 12/29/2018  Glucose 70 - 99 mg/dL 151(H) 98 102(H)  BUN 8 - 23 mg/dL 26(H) 21 12  Creatinine 0.61 - 1.24 mg/dL 2.80(H) 1.53(H) 0.84  BUN/Creat Ratio 6 - 22 (calc) - 14 -  Sodium 135 - 145 mmol/L 137 139 134(L)  Potassium 3.5 - 5.1 mmol/L 3.9 4.2 3.5  Chloride 98 - 111 mmol/L 101 100 98  CO2 22 - 32 mmol/L 19(L) 20 21(L)  Calcium 8.9 - 10.3 mg/dL 8.5(L) 8.8 8.6(L)   CBC Latest Ref Rng & Units 05/03/2020 05/01/2020 12/29/2018  WBC 4.0 - 10.5 K/uL 7.3 8.6 6.9  Hemoglobin 13.0 - 17.0 g/dL 12.2(L) 12.1(L) 12.5(L)  Hematocrit 39.0 - 52.0 % 36.7(L) 35.2(L) 35.0(L)  Platelets 150 - 400 K/uL 182 208  176   Urinalysis No results found for: COLORURINE, APPEARANCEUR, LABSPEC, PHURINE, GLUCOSEU, HGBUR, BILIRUBINUR, KETONESUR, PROTEINUR, UROBILINOGEN, NITRITE, LEUKOCYTESUR   CT Abdomen Pelvis Wo Contrast  Result Date: 05/04/2020 CLINICAL DATA:  Abdominal distension. Intermittent abdominal pain. New onset congestive heart failure. EXAM: CT ABDOMEN AND PELVIS WITHOUT CONTRAST TECHNIQUE: Multidetector CT imaging of the abdomen and pelvis was performed following the standard protocol without IV contrast. COMPARISON:  None. FINDINGS: Lower chest: Minimal atelectasis present at the right base. Lungs are otherwise clear. Atherosclerotic calcifications are present in the descending thoracic aorta. Coronary artery calcifications are present. Heart size is normal. No significant pericardial effusion is present. Hepatobiliary: Diffuse fatty infiltration liver is present. Abdominal ascites surround the liver. Liver margin is indistinct. No discrete mass lesion is present. Common bile duct and gallbladder are within normal limits. Pancreas: Unremarkable. No pancreatic ductal dilatation or surrounding inflammatory changes. Spleen: Normal in size without focal abnormality. Adrenals/Urinary Tract: Adrenal glands are normal bilaterally. Kidneys and ureters are unremarkable. No stone or mass lesion is present. No obstruction is present. The urinary bladder is within normal limits. Stomach/Bowel: The stomach and duodenum are within normal limits. Small bowel is unremarkable. Terminal ileum is within normal limits. Ascending and transverse colon are normal. Descending and sigmoid colon are normal. Vascular/Lymphatic: Atherosclerotic calcifications are present in the aorta branch vessels without aneurysm. No significant adenopathy is present. Reproductive: Prostate is unremarkable. Other: Extensive abdominal ascites is noted. No mass lesion is evident. No significant ventral hernia is present. Musculoskeletal: A remote superior  endplate fracture and Schmorl's node at L2. Vertebral body heights are otherwise normal. Alignment is anatomic. Fused anterior osteophytes are present in the thoracic spine. No focal lytic or blastic lesions are present. The SI joints are fused bilaterally. Hips are located and within normal limits bilaterally. IMPRESSION: 1. Extensive abdominal ascites. In the absence of significant pleural effusions or edema, this is most likely hepatic in nature. 2. Diffuse fatty infiltration liver with indistinct margins suggesting cirrhosis. 3. Fusion of the SI joints bilaterally. Question ankylosing spondylitis. 4. Remote superior endplate fracture and Schmorl's node at L2. 5. Aortic Atherosclerosis (ICD10-I70.0). These results were called by telephone at the time of interpretation on 05/04/2020 at 8:17 am to provider MELANIE BELFI , who verbally acknowledged these results. Electronically Signed   By: San Morelle M.D.   On: 05/04/2020 08:20   DG Chest 2 View  Result Date: 05/03/2020 CLINICAL DATA:  Shortness of breath. EXAM: CHEST - 2 VIEW COMPARISON:  March 13, 2011. FINDINGS: The heart size and mediastinal contours are within normal limits. Both lungs are clear. No pneumothorax or pleural effusion is noted. The  visualized skeletal structures are unremarkable. IMPRESSION: No active cardiopulmonary disease. Electronically Signed   By: Marijo Conception M.D.   On: 05/03/2020 12:02   US Paracentesis  Result Date: 05/04/2020 INDICATION: Patient with elevated LFTs presents today with large volume ascites. Interventional radiology asked to perform a therapeutic and diagnostic paracentesis. EXAM: ULTRASOUND GUIDED PARACENTESIS MEDICATIONS: 1% lidocaine 10 mL COMPLICATIONS: None immediate. PROCEDURE: Informed written consent was obtained from the patient after a discussion of the risks, benefits and alternatives to treatment. A timeout was performed prior to the initiation of the procedure. Initial ultrasound scanning  demonstrates a large amount of ascites within the right lower abdominal quadrant. The right lower abdomen was prepped and draped in the usual sterile fashion. 1% lidocaine was used for local anesthesia. Following this, a 19 gauge, 7-cm, Yueh catheter was introduced. An ultrasound image was saved for documentation purposes. The paracentesis was performed. The catheter was removed and a dressing was applied. The patient tolerated the procedure well without immediate post procedural complication. FINDINGS: A total of approximately 5.2 L of clear yellow fluid was removed. Samples were sent to the laboratory as requested by the clinical team. IMPRESSION: Successful ultrasound-guided paracentesis yielding 5.2 liters of peritoneal fluid. Read by: Soyla Dryer, NP Electronically Signed   By: Sandi Mariscal M.D.   On: 05/04/2020 11:08   VAS [place] LOWER EXTREMITY VENOUS (DVT)  Result Date: 05/03/2020  Lower Venous DVT Study Indications: Swelling, Edema, Pain, and SOB.  Performing Technologist: Delorise Shiner RVT  Examination Guidelines: A complete evaluation includes B-mode imaging, spectral Doppler, color Doppler, and power Doppler as needed of all accessible portions of each vessel. Bilateral testing is considered an integral part of a complete examination. Limited examinations for reoccurring indications may be performed as noted. The reflux portion of the exam is performed with the patient in reverse Trendelenburg.  +---------+---------------+---------+-----------+----------+--------------+ RIGHT    CompressibilityPhasicitySpontaneityPropertiesThrombus Aging +---------+---------------+---------+-----------+----------+--------------+ CFV      Full           Yes      Yes                                 +---------+---------------+---------+-----------+----------+--------------+ SFJ      Full           Yes      Yes                                  +---------+---------------+---------+-----------+----------+--------------+ FV Prox  Full           Yes      Yes                                 +---------+---------------+---------+-----------+----------+--------------+ FV Mid   Full           Yes      Yes                                 +---------+---------------+---------+-----------+----------+--------------+ FV DistalFull           Yes      Yes                                 +---------+---------------+---------+-----------+----------+--------------+  PFV      Full                    Yes                                 +---------+---------------+---------+-----------+----------+--------------+ POP      Full           Yes      Yes                                 +---------+---------------+---------+-----------+----------+--------------+ PTV      Full                                                        +---------+---------------+---------+-----------+----------+--------------+ GSV      Full                    Yes                                 +---------+---------------+---------+-----------+----------+--------------+ SSV      Full                    Yes                                 +---------+---------------+---------+-----------+----------+--------------+   +---------+---------------+---------+-----------+----------+--------------+ LEFT     CompressibilityPhasicitySpontaneityPropertiesThrombus Aging +---------+---------------+---------+-----------+----------+--------------+ CFV      Full           Yes      Yes                                 +---------+---------------+---------+-----------+----------+--------------+ SFJ      Full           Yes      Yes                                 +---------+---------------+---------+-----------+----------+--------------+ FV Prox  Full           Yes      Yes                                  +---------+---------------+---------+-----------+----------+--------------+ FV Mid   Full           Yes      Yes                                 +---------+---------------+---------+-----------+----------+--------------+ FV DistalFull           Yes      Yes                                 +---------+---------------+---------+-----------+----------+--------------+ PFV      Full  Yes                                 +---------+---------------+---------+-----------+----------+--------------+ POP      Full           Yes      Yes                                 +---------+---------------+---------+-----------+----------+--------------+ PTV      Full                    Yes                                 +---------+---------------+---------+-----------+----------+--------------+ GSV      Full                    Yes                                 +---------+---------------+---------+-----------+----------+--------------+ SSV      Full                    Yes                                 +---------+---------------+---------+-----------+----------+--------------+     Summary: RIGHT: - There is no evidence of deep vein thrombosis in the lower extremity. - There is no evidence of superficial venous thrombosis.  - No cystic structure found in the popliteal fossa.  LEFT: - There is no evidence of deep vein thrombosis in the lower extremity. - There is no evidence of superficial venous thrombosis.  - No cystic structure found in the popliteal fossa.  *See table(s) above for measurements and observations. Electronically signed by Servando Snare MD on 05/03/2020 at 11:09:53 AM.    Final     Review of Systems  Constitutional: Positive for appetite change and fatigue. Negative for chills and fever.  HENT: Negative for facial swelling, nosebleeds, sinus pressure, sore throat and trouble swallowing.   Eyes: Negative for photophobia, pain and visual disturbance.   Respiratory: Positive for shortness of breath. Negative for cough, chest tightness and wheezing.        Primarily with exertion and upon laying flat  Cardiovascular: Positive for leg swelling. Negative for chest pain.  Gastrointestinal: Positive for abdominal distention. Negative for abdominal pain, constipation, diarrhea, nausea and vomiting.       Abdomen not painful but feels tense  Endocrine: Negative for polydipsia and polyphagia.  Genitourinary: Positive for decreased urine volume and difficulty urinating. Negative for dysuria, frequency and urgency.  Musculoskeletal: Positive for arthralgias and myalgias.       In the lower extremities  Skin: Positive for pallor. Negative for rash.  Neurological: Positive for weakness. Negative for tremors and light-headedness.  Psychiatric/Behavioral: Positive for confusion.   Blood pressure (!) 128/57, pulse 94, temperature 97.9 F (36.6 C), temperature source Oral, resp. rate 20, height 5\' 11"  (1.803 m), weight 100.7 kg, SpO2 100 %. Physical Exam Vitals reviewed.  Constitutional:      General: He is not in acute distress.    Appearance: He is well-developed. He is obese. He is ill-appearing.  HENT:     Head: Normocephalic.     Mouth/Throat:     Pharynx: Oropharynx is clear.  Eyes:     Pupils: Pupils are equal, round, and reactive to light.  Neck:     Vascular: JVD present.     Comments: 11 to 12 cm JVP Cardiovascular:     Rate and Rhythm: Normal rate and regular rhythm.     Heart sounds: No murmur heard. No friction rub.  Pulmonary:     Effort: Pulmonary effort is normal. Tachypnea present. No respiratory distress.     Breath sounds: Normal breath sounds.  Chest:     Chest wall: No tenderness or edema.  Abdominal:     Palpations: There is mass.     Comments: Distended abdomen, firm  Musculoskeletal:     Cervical back: Neck supple.     Right lower leg: Edema present.     Left lower leg: Edema present.     Comments: 2-3+ pitting  lower extremity edema  Skin:    General: Skin is warm and dry.     Coloration: Skin is pale.  Neurological:     General: No focal deficit present.     Mental Status: He is alert.     Assessment/Plan: 1.  Acute kidney injury: Using the baseline function available from 2020, he appears to have had fairly decent renal function however, there are no labs available until the time of his presentation when he already had evidence of acute kidney injury with worsening creatinine.  Given the available database, high index of suspicion that this is hepatorenal syndrome; urine electrolytes and urinalysis have been requested and are pending.  He discontinued nonsteroidal anti-inflammatory drugs over a month ago and the hemodynamic effects of this should have waned by now.  I agree with Dr. Osborn Coho assessment that he should have intravenous albumin administered if paracentesis of more than 5 L is performed to avoid exacerbating his renal insufficiency and functional intravascular volume.  I will prescribe 50 g intravenous albumin and discontinue furosemide at this time awaiting labs from tomorrow to direct the further course of action.  We had a brief discussion regarding the limited role of renal replacement therapy in patients with hepatorenal syndrome and I explained to him reasons why he would not be an ideal candidate for this.  CT scan of the abdomen and pelvis did not show any obstruction and I will again evaluate results of urinalysis to decide on further evaluation and management. Avoid nephrotoxic medications including NSAIDs and iodinated intravenous contrast exposure unless the latter is absolutely indicated.  Preferred narcotic agents for pain control are hydromorphone, fentanyl, and methadone. Morphine should not be used. Avoid Baclofen and avoid oral sodium phosphate and magnesium citrate based laxatives / bowel preps. Continue strict Input and Output monitoring. Will monitor the patient closely with  you and intervene or adjust therapy as indicated by changes in clinical status/labs. 2.  Decompensated liver disease: Likely alcoholic liver disease/cirrhosis.  He does not have any evidence of hepatic encephalopathy and recommendations from Dr. Cristina Gong are noted.  Awaiting analysis of ascites fluid. 3.  Hypertension: We will hold amlodipine at this time and continue propranolol for blood pressure control-this can be adjusted if needed.  High blood pressure may be more permissive to use of diuretics. 4.  Pedal edema/ascites: Secondary to decompensated liver disease/hypoalbuminemia in the setting of worsening renal function and devious NSAID use.  Will give intravenous albumin overnight, await labs from tomorrow to help  decide on diuretic dosing.  He already had 40 mg oral furosemide given earlier.  Therapeutic paracentesis as indicated (keep volume <5 L at a time and administer 50 g albumin if higher volume tapped).  Hanah Moultry K. 05/04/2020, 3:45 PM

## 2020-05-05 DIAGNOSIS — R601 Generalized edema: Secondary | ICD-10-CM | POA: Diagnosis not present

## 2020-05-05 DIAGNOSIS — N179 Acute kidney failure, unspecified: Secondary | ICD-10-CM | POA: Diagnosis not present

## 2020-05-05 DIAGNOSIS — I10 Essential (primary) hypertension: Secondary | ICD-10-CM | POA: Diagnosis not present

## 2020-05-05 DIAGNOSIS — R0600 Dyspnea, unspecified: Secondary | ICD-10-CM | POA: Diagnosis not present

## 2020-05-05 LAB — COMPREHENSIVE METABOLIC PANEL
ALT: 45 U/L — ABNORMAL HIGH (ref 0–44)
AST: 77 U/L — ABNORMAL HIGH (ref 15–41)
Albumin: 2.7 g/dL — ABNORMAL LOW (ref 3.5–5.0)
Alkaline Phosphatase: 159 U/L — ABNORMAL HIGH (ref 38–126)
Anion gap: 12 (ref 5–15)
BUN: 28 mg/dL — ABNORMAL HIGH (ref 8–23)
CO2: 21 mmol/L — ABNORMAL LOW (ref 22–32)
Calcium: 8 mg/dL — ABNORMAL LOW (ref 8.9–10.3)
Chloride: 106 mmol/L (ref 98–111)
Creatinine, Ser: 2.48 mg/dL — ABNORMAL HIGH (ref 0.61–1.24)
GFR, Estimated: 26 mL/min — ABNORMAL LOW (ref >60)
Glucose, Bld: 136 mg/dL — ABNORMAL HIGH (ref 70–99)
Potassium: 3.4 mmol/L — ABNORMAL LOW (ref 3.5–5.1)
Sodium: 139 mmol/L (ref 135–145)
Total Bilirubin: 2.3 mg/dL — ABNORMAL HIGH (ref 0.3–1.2)
Total Protein: 5.3 g/dL — ABNORMAL LOW (ref 6.5–8.1)

## 2020-05-05 LAB — FOLATE: Folate: 29.7 ng/mL (ref >5.9)

## 2020-05-05 LAB — CBC
HCT: 31.1 % — ABNORMAL LOW (ref 39.0–52.0)
Hemoglobin: 10.5 g/dL — ABNORMAL LOW (ref 13.0–17.0)
MCH: 37 pg — ABNORMAL HIGH (ref 26.0–34.0)
MCHC: 33.8 g/dL (ref 30.0–36.0)
MCV: 109.5 fL — ABNORMAL HIGH (ref 80.0–100.0)
Platelets: 131 10*3/uL — ABNORMAL LOW (ref 150–400)
RBC: 2.84 MIL/uL — ABNORMAL LOW (ref 4.22–5.81)
RDW: 14.3 % (ref 11.5–15.5)
WBC: 5.8 10*3/uL (ref 4.0–10.5)
nRBC: 0 % (ref 0.0–0.2)

## 2020-05-05 LAB — AMMONIA: Ammonia: 47 umol/L — ABNORMAL HIGH (ref 9–35)

## 2020-05-05 LAB — MAGNESIUM: Magnesium: 1.6 mg/dL — ABNORMAL LOW (ref 1.7–2.4)

## 2020-05-05 LAB — PROTIME-INR
INR: 1.3 — ABNORMAL HIGH (ref 0.8–1.2)
Prothrombin Time: 15.8 seconds — ABNORMAL HIGH (ref 11.4–15.2)

## 2020-05-05 LAB — PHOSPHORUS: Phosphorus: 2.3 mg/dL — ABNORMAL LOW (ref 2.5–4.6)

## 2020-05-05 LAB — VITAMIN B12: Vitamin B-12: 1696 pg/mL — ABNORMAL HIGH (ref 180–914)

## 2020-05-05 MED ORDER — LIDOCAINE VISCOUS HCL 2 % MT SOLN
15.0000 mL | Freq: Once | OROMUCOSAL | Status: AC
  Administered 2020-05-05: 15 mL via ORAL
  Filled 2020-05-05: qty 15

## 2020-05-05 MED ORDER — ALUM & MAG HYDROXIDE-SIMETH 200-200-20 MG/5ML PO SUSP
30.0000 mL | Freq: Once | ORAL | Status: AC
  Administered 2020-05-05: 30 mL via ORAL
  Filled 2020-05-05: qty 30

## 2020-05-05 MED ORDER — ALBUMIN HUMAN 25 % IV SOLN
50.0000 g | Freq: Once | INTRAVENOUS | Status: AC
  Administered 2020-05-05: 50 g via INTRAVENOUS
  Filled 2020-05-05: qty 200

## 2020-05-05 MED ORDER — SODIUM CHLORIDE 0.9 % IV SOLN
2.0000 g | INTRAVENOUS | Status: DC
  Administered 2020-05-05 – 2020-05-07 (×3): 2 g via INTRAVENOUS
  Filled 2020-05-05 (×3): qty 20

## 2020-05-05 MED ORDER — SODIUM CHLORIDE 0.9 % IV SOLN
INTRAVENOUS | Status: DC | PRN
  Administered 2020-05-05 – 2020-05-09 (×2): 250 mL via INTRAVENOUS

## 2020-05-05 MED ORDER — PANTOPRAZOLE SODIUM 40 MG PO TBEC
40.0000 mg | DELAYED_RELEASE_TABLET | Freq: Every day | ORAL | Status: DC
  Administered 2020-05-05 – 2020-05-11 (×7): 40 mg via ORAL
  Filled 2020-05-05 (×7): qty 1

## 2020-05-05 MED ORDER — POTASSIUM CHLORIDE CRYS ER 20 MEQ PO TBCR
40.0000 meq | EXTENDED_RELEASE_TABLET | Freq: Once | ORAL | Status: AC
  Administered 2020-05-05: 40 meq via ORAL
  Filled 2020-05-05: qty 2

## 2020-05-05 MED ORDER — FUROSEMIDE 10 MG/ML IJ SOLN
40.0000 mg | Freq: Once | INTRAMUSCULAR | Status: AC
  Administered 2020-05-05: 40 mg via INTRAVENOUS
  Filled 2020-05-05: qty 4

## 2020-05-05 NOTE — Plan of Care (Signed)
  Problem: Pain Managment: Goal: General experience of comfort will improve Outcome: Completed/Met

## 2020-05-05 NOTE — Progress Notes (Signed)
Patient ID: Randy Wood, male   DOB: 10-02-1940, 80 y.o.   MRN: [id number] [facility] Progress Note   Assessment/ Plan:   1.  Acute kidney injury:  High index of suspicion based on urine electrolytes that this is HRS-AKI however, in light of the recognition of SBP, this indeed might be acute kidney injury associated with peritonitis.  Overnight, renal function slightly better with paracentesis/albumin infusion that indeed may have helped with renal perfusion and now on treatment with ceftriaxone for SBP.  I will repeat albumin infusion today along with intravenous furosemide to help promote diuresis/volume unloading. 2.  Decompensated liver disease: Likely alcoholic liver disease/cirrhosis now with evidence of SBP (atypical clinical presentation).  He does not have any evidence of hepatic encephalopathy and recommendations from Dr. Cristina Gong are noted.   3.  Hypertension:  Continue to hold antihypertensive therapy and restart propranolol if needed for portal hypertension. 4.  Pedal edema/ascites: Secondary to decompensated liver disease/hypoalbuminemia in the setting of worsening renal function and previous NSAID use.  Will give intravenous albumin and furosemide today.  If therapeutic paracentesis is ordered, he will need intravenous albumin (50 g of 25%) if >5 L ascitic fluid drawn.  Subjective:   Reports to be feeling somewhat better than yesterday-had an episode of urinary incontinence overnight.   Objective:   BP 130/64 (BP Location: Left Arm)   Pulse 87   Temp (!) 97.3 F (36.3 C) (Oral)   Resp 20   Ht 5\' 11"  (1.803 m)   Wt 103.8 kg   SpO2 99%   BMI 31.93 kg/m   Intake/Output Summary (Last 24 hours) at 05/05/2020 0856 Last data filed at 05/05/2020 0800 Gross per 24 hour  Intake 0 ml  Output 1 ml  Net -1 ml   Weight change:   Physical Exam: Gen: Appears comfortable resting in bed, able to respond appropriately to questions. CVS: Pulse regular rhythm, normal rate,  ejection systolic murmur over outflow tract Resp: Diminished breath sounds over bases due to poor inspiratory effort, no rales/rhonchi Abd: Firm, moderately distended, nontender Ext: 3+/4+ pitting edema over lower extremities  Imaging: CT Abdomen Pelvis Wo Contrast  Result Date: 05/04/2020 CLINICAL DATA:  Abdominal distension. Intermittent abdominal pain. New onset congestive heart failure. EXAM: CT ABDOMEN AND PELVIS WITHOUT CONTRAST TECHNIQUE: Multidetector CT imaging of the abdomen and pelvis was performed following the standard protocol without IV contrast. COMPARISON:  None. FINDINGS: Lower chest: Minimal atelectasis present at the right base. Lungs are otherwise clear. Atherosclerotic calcifications are present in the descending thoracic aorta. Coronary artery calcifications are present. Heart size is normal. No significant pericardial effusion is present. Hepatobiliary: Diffuse fatty infiltration liver is present. Abdominal ascites surround the liver. Liver margin is indistinct. No discrete mass lesion is present. Common bile duct and gallbladder are within normal limits. Pancreas: Unremarkable. No pancreatic ductal dilatation or surrounding inflammatory changes. Spleen: Normal in size without focal abnormality. Adrenals/Urinary Tract: Adrenal glands are normal bilaterally. Kidneys and ureters are unremarkable. No stone or mass lesion is present. No obstruction is present. The urinary bladder is within normal limits. Stomach/Bowel: The stomach and duodenum are within normal limits. Small bowel is unremarkable. Terminal ileum is within normal limits. Ascending and transverse colon are normal. Descending and sigmoid colon are normal. Vascular/Lymphatic: Atherosclerotic calcifications are present in the aorta branch vessels without aneurysm. No significant adenopathy is present. Reproductive: Prostate is unremarkable. Other: Extensive abdominal ascites is noted. No mass lesion is evident. No significant  ventral hernia is  present. Musculoskeletal: A remote superior endplate fracture and Schmorl's node at L2. Vertebral body heights are otherwise normal. Alignment is anatomic. Fused anterior osteophytes are present in the thoracic spine. No focal lytic or blastic lesions are present. The SI joints are fused bilaterally. Hips are located and within normal limits bilaterally. IMPRESSION: 1. Extensive abdominal ascites. In the absence of significant pleural effusions or edema, this is most likely hepatic in nature. 2. Diffuse fatty infiltration liver with indistinct margins suggesting cirrhosis. 3. Fusion of the SI joints bilaterally. Question ankylosing spondylitis. 4. Remote superior endplate fracture and Schmorl's node at L2. 5. Aortic Atherosclerosis (ICD10-I70.0). These results were called by telephone at the time of interpretation on 05/04/2020 at 8:17 am to provider MELANIE BELFI , who verbally acknowledged these results. Electronically Signed   By: San Morelle M.D.   On: 05/04/2020 08:20   DG Chest 2 View  Result Date: 05/03/2020 CLINICAL DATA:  Shortness of breath. EXAM: CHEST - 2 VIEW COMPARISON:  March 13, 2011. FINDINGS: The heart size and mediastinal contours are within normal limits. Both lungs are clear. No pneumothorax or pleural effusion is noted. The visualized skeletal structures are unremarkable. IMPRESSION: No active cardiopulmonary disease. Electronically Signed   By: Marijo Conception M.D.   On: 05/03/2020 12:02   US Paracentesis  Result Date: 05/04/2020 INDICATION: Patient with elevated LFTs presents today with large volume ascites. Interventional radiology asked to perform a therapeutic and diagnostic paracentesis. EXAM: ULTRASOUND GUIDED PARACENTESIS MEDICATIONS: 1% lidocaine 10 mL COMPLICATIONS: None immediate. PROCEDURE: Informed written consent was obtained from the patient after a discussion of the risks, benefits and alternatives to treatment. A timeout was performed prior to  the initiation of the procedure. Initial ultrasound scanning demonstrates a large amount of ascites within the right lower abdominal quadrant. The right lower abdomen was prepped and draped in the usual sterile fashion. 1% lidocaine was used for local anesthesia. Following this, a 19 gauge, 7-cm, Yueh catheter was introduced. An ultrasound image was saved for documentation purposes. The paracentesis was performed. The catheter was removed and a dressing was applied. The patient tolerated the procedure well without immediate post procedural complication. FINDINGS: A total of approximately 5.2 L of clear yellow fluid was removed. Samples were sent to the laboratory as requested by the clinical team. IMPRESSION: Successful ultrasound-guided paracentesis yielding 5.2 liters of peritoneal fluid. Read by: Soyla Dryer, NP Electronically Signed   By: Sandi Mariscal M.D.   On: 05/04/2020 11:08    Labs: BMET Recent Labs  Lab 05/01/20 1348 05/03/20 1152 05/05/20 0411  NA 139 137 139  K 4.2 3.9 3.4*  CL 100 101 106  CO2 20 19* 21*  GLUCOSE 98 151* 136*  BUN 21 26* 28*  CREATININE 1.53* 2.80* 2.48*  CALCIUM 8.8 8.5* 8.0*  PHOS  --   --  2.3*   CBC Recent Labs  Lab 05/01/20 1348 05/03/20 1152 05/05/20 0411  WBC 8.6 7.3 5.8  NEUTROABS 5,865  --   --   HGB 12.1* 12.2* 10.5*  HCT 35.2* 36.7* 31.1*  MCV 107.3* 110.2* 109.5*  PLT 208 182 131*   Medications:    . brimonidine  1 drop Left Eye BID  . diclofenac Sodium  2 g Topical BID  . dorzolamide-timolol  1 drop Left Eye BID  . fluticasone  1 spray Each Nare Daily  . folic acid  1 mg Oral Daily  . LORazepam  0-4 mg Intravenous Q6H   Followed by  . [  START ON 05/06/2020] LORazepam  0-4 mg Intravenous Q12H  . multivitamin with minerals  1 tablet Oral Daily  . pantoprazole  40 mg Oral Daily  . sodium chloride flush  3 mL Intravenous Q12H  . thiamine  100 mg Oral Daily   Or  . thiamine  100 mg Intravenous Daily   Elmarie Shiley, MD 05/05/2020,  8:56 AM

## 2020-05-05 NOTE — Evaluation (Signed)
Physical Therapy Evaluation Patient Details Name: Randy Wood MRN: [id number] DOB: 17-Jul-1940 Today's Date: 05/05/2020   History of Present Illness  Patient is a 80 y/o male who presents with SOB, swelling. Admitted with new onset of CHF and anasarca secondary to hepatic failure. CT abd-ascites with infiltration of liver conerning for cirrhosis. s/p paracentesis 2/5. PMH includes macular degeneration, alcohol abuse and HTN.  Clinical Impression  Patient presents with lethargy, LE/abdominal swelling, generalized weakness and impaired mobility s/p above. Pt reports living alone and being independent for ADLs/IADLs and driving PTA. Uses SPC as needed. Does not have any family close by that could assist. Today, pt requires Min A for bed mobility, mod A to stand from EOB with cues and Min guard assist for short distance ambulation with use of RW for support. Declines hallway ambulation. VSS on RA. No SOB noted. Will need to assess cognition more next session as pt just woke up and seems slightly confused. Encouraged increasing activity while in the hospital. Will follow acutely to maximize independence and mobility prior to return home.    Follow Up Recommendations Home health PT;Supervision - Intermittent    Equipment Recommendations  Rolling walker with 5" wheels    Recommendations for Other Services OT consult     Precautions / Restrictions Precautions Precautions: Fall;Other (comment) Precaution Comments: pitting edema BLE, ascites Restrictions Weight Bearing Restrictions: No      Mobility  Bed Mobility Overal bed mobility: Needs Assistance Bed Mobility: Supine to Sit;Sit to Supine     Supine to sit: Supervision;HOB elevated Sit to supine: Min assist   General bed mobility comments: Able to get to EOB wtih use of rail; assist to bring RLE into bed to return to supine.    Transfers Overall transfer level: Needs assistance Equipment used: Rolling walker (2 wheeled) Transfers:  Sit to/from Stand Sit to Stand: Mod assist         General transfer comment: Mod A to stand from EOB with cues for hand placement/technique, unable to stand without assist as pt pulling up on RW.  Ambulation/Gait Ambulation/Gait assistance: Min guard Gait Distance (Feet): 25 Feet Assistive device: Rolling walker (2 wheeled) Gait Pattern/deviations: Step-through pattern;Decreased stride length Gait velocity: decreased   General Gait Details: Slow, mildly unsteady gait with RW for support; declines hallway ambulation. VSS on RA. No SOB noted. Lethargic.  Stairs            Wheelchair Mobility    Modified Rankin (Stroke Patients Only)       Balance Overall balance assessment: Needs assistance Sitting-balance support: Feet supported;No upper extremity supported Sitting balance-Leahy Scale: Good     Standing balance support: During functional activity Standing balance-Leahy Scale: Poor Standing balance comment: Requires UE support, requesting to use RW for support today.                             Pertinent Vitals/Pain Pain Assessment: No/denies pain    Home Living Family/patient expects to be discharged to:: Private residence Living Arrangements: Alone Available Help at Discharge: Family;Available PRN/intermittently Type of Home: House Home Access: Stairs to enter Entrance Stairs-Rails: None Entrance Stairs-Number of Steps: 2 Home Layout: One level Home Equipment: Cane - single point;Bedside commode;Shower seat - built in      Prior Function Level of Independence: Independent with assistive device(s)         Comments: Uses SPC as needed. Does own ADLs. Drives. Cooks/cleans. No falls in  last 6 months.     Hand Dominance   Dominant Hand: Right    Extremity/Trunk Assessment   Upper Extremity Assessment Upper Extremity Assessment: Defer to OT evaluation    Lower Extremity Assessment Lower Extremity Assessment: Generalized weakness  (pitting edema 2-3+ BLEs, sensation WFLs BLEs per report)       Communication   Communication: No difficulties  Cognition Arousal/Alertness: Lethargic Behavior During Therapy: Flat affect Overall Cognitive Status: No family/caregiver present to determine baseline cognitive functioning                                 General Comments: States it is "April 2022" Able to state why he was in the hospital. Just woke up from sleeping and lethargic still so this could be contributing to slight confusion initially. Needs max repetition to follow simple commands. When telling pt plan to get up and walk, pt laying back down and pulling covers over him. Reports feeling close to baseline however needed help to stand today and had to use RW whichi he normally does not?      General Comments General comments (skin integrity, edema, etc.): VSS on RA.    Exercises     Assessment/Plan    PT Assessment Patient needs continued PT services  PT Problem List Decreased mobility;Decreased knowledge of use of DME;Decreased activity tolerance;Decreased cognition       PT Treatment Interventions Therapeutic exercise;DME instruction;Gait training;Balance training;Functional mobility training;Therapeutic activities;Patient/family education;Cognitive remediation;Stair training    PT Goals (Current goals can be found in the Care Plan section)  Acute Rehab PT Goals Patient Stated Goal: to get some sleep PT Goal Formulation: With patient Time For Goal Achievement: 05/19/20 Potential to Achieve Goals: Good    Frequency Min 3X/week   Barriers to discharge Decreased caregiver support lives alone    Co-evaluation               AM-PAC PT "6 Clicks" Mobility  Outcome Measure Help needed turning from your back to your side while in a flat bed without using bedrails?: A Little Help needed moving from lying on your back to sitting on the side of a flat bed without using bedrails?: A  Little Help needed moving to and from a bed to a chair (including a wheelchair)?: A Lot Help needed standing up from a chair using your arms (e.g., wheelchair or bedside chair)?: A Lot Help needed to walk in hospital room?: A Little Help needed climbing 3-5 steps with a railing? : A Little 6 Click Score: 16    End of Session Equipment Utilized During Treatment: Gait belt Activity Tolerance: Patient limited by lethargy Patient left: in bed;with call bell/phone within reach;with bed alarm set Nurse Communication: Mobility status PT Visit Diagnosis: Muscle weakness (generalized) (M62.81);Unsteadiness on feet (R26.81)    Time: 2423-5361 PT Time Calculation (min) (ACUTE ONLY): 19 min   Charges:   PT Evaluation $PT Eval Moderate Complexity: 1 Mod          Marisa Severin, PT, DPT Acute Rehabilitation Services Pager [phone] Office [place] 05/05/2020, 8:49 AM

## 2020-05-05 NOTE — Progress Notes (Signed)
PROGRESS NOTE    Randy Wood  [name] DOB: 10/15/1940 DOA: 05/03/2020 PCP: Eunice Blase, MD   Brief Narrative:  HPI on 05/04/2020 by Dr. Fuller Plan LAAKEA Randy Wood is a 80 y.o. male with medical history significant of hypertension, hyperlipidemia, alcohol abuse presents with complaints of shortness of breath and loss of strength in his leg.  Since the middle of September 2021 he reports that he has had progressively worsening swelling of his legs that now goes up into his thighs.  He has had shortness of breath on exertion.  At this time even trying to get dressed he is out of breath.  He has been seen by his primary care provider and had recently done labs which gave concern for the possibility of DVT.  Patient had a Doppler ultrasound of his lower extremities performed yesterday that was negative.  He is unsure of how much weight he has put on, but states that it is hard for him to pick up his legs he feels so heavy and his stomach has been getting more distended.  With the leg swelling it has also caused significant knee and joint pain.  He had been taking ibuprofen 800 mg twice daily up until the middle of December 2021.  Question if he had a urinary tract infection because his urine had changed to a more dark color.  Patient had not been tried on any diuretics.  Previously, when on hydrochlorothiazide and losartan some years ago for blood pressure had had several falls.  He does admit that he drinks 3 drinks of vodka and tonic water daily.  Usually each drink is made up of 1 ounce of vodka to 7 ounces of tonic water.  Denies having any fevers, cough, dysuria, confusion, or chest pain symptoms.   Interim history Patient admitted with decompensated liver disease, with significant ascites along with renal failure.  GI and nephrology consulted and appreciated. Assessment & Plan   Decompensated liver disease with anasarca -Patient complained of progressively worsening abdominal and lower  extremity swelling -CT imaging showed extensive ascites and concern for cirrhosis -Meld score 23 on admission -Suspect liver failure secondary to alcohol although patient tells me his last use was approximately 1 month ago -Ultrasound-guided paracentesis yielded 5.2 L of clear yellow fluid -?Hepatorenal syndrome given azotemia -Gastroenterology and nephrology consulted and appreciated -Nephrology planning on continuing IV albumin as well as Lasix to help promote diuresis and volume unloading today -Peritoneal fluid Gram stain shows gram-negative rods -Patient started on ceftriaxone  Acute kidney injury -Creatinine on admission was 2.8, down to 2.48 today.  Creatinine 2020 was 0.84, however 2 days prior to admission, creatinine was 1.53.  Unknown true baseline. -Patient had been using ibuprofen although states he is not used it since December -As above ?  Hepatorenal syndrome -Treatment and plan as above -Nephrology consulted and appreciated -Avoid nephrotoxic agents -Continue to monitor BMP  Dyspnea on exertion -Patient currently on room air -BNP was elevated to 37.7 -Chest x-ray unremarkable for infection or edema -Suspect secondary to the above -Patient does feel his breathing has improved today although has not been out of bed. -Continue incentive spirometry  Lower extremity weakness -Patient states he has been unable to get up and walk around due to weakness in his legs.  Suspect this is secondary to significant lower extremity edema -PT consulted and recommended home health  Essential hypertension -Home medications of amlodipine and propranolol were held due to paracentesis and possible fluid shift -Will continue  to monitor blood pressure and restart medications as needed  Macrocytic anemia -Hemoglobin 10.5 today, continue to monitor CBC -Suspect due to vitamin B12 deficiency, will obtain level  Osteoarthritis, acute on chronic -Patient complains of joint  pains -Continue Voltaren gel as needed  Transaminitis with hyperbilirubinemia -Likely secondary to the above -Hepatitis panel nonreactive  Alcohol abuse -Patient states he has not had any alcohol over the last month although reported to the manner that he was drinking 3 drinks of vodka and tonic water a day -Continue CIWA protocol with Ativan -Discussed cessation  GERD -Continue PPI  Hypokalemia -Will replace and continue to monitor BMP  DVT Prophylaxis  SCDs  Code Status: Full  Family Communication: None at bedside  Disposition Plan:  Status is: Inpatient  Remains inpatient appropriate because:Inpatient level of care appropriate due to severity of illness   Dispo: The patient is from: Home              Anticipated d/c is to: Home              Anticipated d/c date is: > 3 days              Patient currently is not medically stable to d/c.   Difficult to place patient No   Consultants Nephrology Gastroenterology  Procedures  Ultrasound-guided paracentesis  Antibiotics   Anti-infectives (From admission, onward)   Start     Dose/Rate Route Frequency Ordered Stop   05/05/20 0500  cefTRIAXone (ROCEPHIN) 2 g in sodium chloride 0.9 % 100 mL IVPB        2 g 200 mL/hr over 30 Minutes Intravenous Every 24 hours 05/05/20 0413        Subjective:   [name] seen and examined today.  Patient feels that his breathing has improved.  Denies current chest pain, abdominal pain, nausea or vomiting, diarrhea or constipation, dizziness or headache.    Objective:   Vitals:   05/05/20 0200 05/05/20 0522 05/05/20 0713 05/05/20 0815  BP: 135/68 126/64 116/62 130/64  Pulse: 94 86 87   Resp: 18 16 20    Temp: 98.4 F (36.9 C) 98.1 F (36.7 C) (!) 97.3 F (36.3 C)   TempSrc: Oral Oral Oral   SpO2: 100% 99% 99%   Weight: 103.8 kg     Height: 5\' 11"  (1.803 m)       Intake/Output Summary (Last 24 hours) at 05/05/2020 0858 Last data filed at 05/05/2020 0800 Gross per 24 hour   Intake 0 ml  Output 1 ml  Net -1 ml   Filed Weights   05/04/20 0726 05/05/20 0200  Weight: 100.7 kg 103.8 kg    Exam  General: Well developed, chronically ill-appearing, NAD  HEENT: NCAT, mucous membranes moist.   Cardiovascular: S1 S2 auscultated, RRR, SEM  Respiratory: Diminished however clear (anteriorly)   Abdomen: Firm, obese, nontender, mildly distended, + bowel sounds  Extremities: warm dry without cyanosis clubbing, 3+ LE edema  Neuro: AAOx3, nonfocal  Psych: appropriate mood and affect   Data Reviewed: I have personally reviewed following labs and imaging studies  CBC: Recent Labs  Lab 05/01/20 1348 05/03/20 1152 05/05/20 0411  WBC 8.6 7.3 5.8  NEUTROABS 5,865  --   --   HGB 12.1* 12.2* 10.5*  HCT 35.2* 36.7* 31.1*  MCV 107.3* 110.2* 109.5*  PLT 208 182 [id number]*   Basic Metabolic Panel: Recent Labs  Lab 05/01/20 1348 05/03/20 1152 05/05/20 0411  NA 139 137 139  K 4.2 3.9  3.4*  CL 100 101 106  CO2 20 19* 21*  GLUCOSE 98 151* 136*  BUN 21 26* 28*  CREATININE 1.53* 2.80* 2.48*  CALCIUM 8.8 8.5* 8.0*  MG  --   --  1.6*  PHOS  --   --  2.3*   GFR: Estimated Creatinine Clearance: 29.1 mL/min (A) (by C-G formula based on SCr of 2.48 mg/dL (H)). Liver Function Tests: Recent Labs  Lab 05/01/20 1348 05/04/20 0753 05/05/20 0411  AST 145* 104* 77*  ALT 78* 64* 45*  ALKPHOS  --  212* 159*  BILITOT 3.2* 3.7* 2.3*  PROT 5.8* 6.3* 5.3*  ALBUMIN  --  2.7* 2.7*   No results for input(s): LIPASE, AMYLASE in the last 168 hours. No results for input(s): AMMONIA in the last 168 hours. Coagulation Profile: Recent Labs  Lab 05/04/20 0902 05/05/20 0411  INR 1.3* 1.3*   Cardiac Enzymes: No results for input(s): CKTOTAL, CKMB, CKMBINDEX, TROPONINI in the last 168 hours. BNP (last 3 results) No results for input(s): PROBNP in the last 8760 hours. HbA1C: No results for input(s): HGBA1C in the last 72 hours. CBG: No results for input(s): GLUCAP in  the last 168 hours. Lipid Profile: No results for input(s): CHOL, HDL, LDLCALC, TRIG, CHOLHDL, LDLDIRECT in the last 72 hours. Thyroid Function Tests: No results for input(s): TSH, T4TOTAL, FREET4, T3FREE, THYROIDAB in the last 72 hours. Anemia Panel: No results for input(s): VITAMINB12, FOLATE, FERRITIN, TIBC, IRON, RETICCTPCT in the last 72 hours. Urine analysis:    Component Value Date/Time   COLORURINE AMBER (A) 05/04/2020 1555   APPEARANCEUR CLOUDY (A) 05/04/2020 1555   LABSPEC 1.020 05/04/2020 1555   PHURINE 5.0 05/04/2020 1555   GLUCOSEU NEGATIVE 05/04/2020 1555   HGBUR NEGATIVE 05/04/2020 1555   BILIRUBINUR SMALL (A) 05/04/2020 1555   KETONESUR 5 (A) 05/04/2020 1555   PROTEINUR 30 (A) 05/04/2020 1555   NITRITE NEGATIVE 05/04/2020 1555   LEUKOCYTESUR MODERATE (A) 05/04/2020 1555   Sepsis Labs: @LABRCNTIP (procalcitonin:4,lacticidven:4)  ) Recent Results (from the past 240 hour(s))  SARS Coronavirus 2 by RT PCR (hospital order, performed in [place] hospital lab) Nasopharyngeal Nasopharyngeal Swab     Status: None   Collection Time: 05/04/20  7:53 AM   Specimen: Nasopharyngeal Swab  Result Value Ref Range Status   SARS Coronavirus 2 NEGATIVE NEGATIVE Final    Comment: (NOTE) SARS-CoV-2 target nucleic acids are NOT DETECTED.  The SARS-CoV-2 RNA is generally detectable in upper and lower respiratory specimens during the acute phase of infection. The lowest concentration of SARS-CoV-2 viral copies this assay can detect is 250 copies / mL. A negative result does not preclude SARS-CoV-2 infection and should not be used as the sole basis for treatment or other patient management decisions.  A negative result may occur with improper specimen collection / handling, submission of specimen other than nasopharyngeal swab, presence of viral mutation(s) within the areas targeted by this assay, and inadequate number of viral copies (<250 copies / mL). A negative result must be  combined with clinical observations, patient history, and epidemiological information.  Fact Sheet for Patients:   StrictlyIdeas.no  Fact Sheet for Healthcare Providers: BankingDealers.co.za  This test is not yet approved or  cleared by the [place] FDA and has been authorized for detection and/or diagnosis of SARS-CoV-2 by FDA under an Emergency Use Authorization (EUA).  This EUA will remain in effect (meaning this test can be used) for the duration of the COVID-19 declaration under Section 564(b)(1) of  the Act, 21 U.S.C. section 360bbb-3(b)(1), unless the authorization is terminated or revoked sooner.  Performed at [facility], [place] [address] [place], [place] [address]   Gram stain     Status: None   Collection Time: 05/04/20 11:14 AM   Specimen: Abdomen; Peritoneal Fluid  Result Value Ref Range Status   Specimen Description ABDOMEN  Final   Special Requests PERITONEAL FLUID  Final   Gram Stain   Final    WBC PRESENT,BOTH PMN AND MONONUCLEAR NO ORGANISMS SEEN CYTOSPIN SMEAR Performed at [facility], [address] [place], [place] [address]    Report Status 05/04/2020 FINAL  Final  Culture, body fluid-bottle     Status: None (Preliminary result)   Collection Time: 05/04/20 11:14 AM   Specimen: Abdomen  Result Value Ref Range Status   Specimen Description ABDOMEN PERITONEAL FLUID  Final   Special Requests   Final    BOTTLES DRAWN AEROBIC ONLY Blood Culture adequate volume   Gram Stain   Final    GRAM NEGATIVE RODS AEROBIC BOTTLE ONLY CRITICAL RESULT CALLED TO, READ BACK BY AND VERIFIED WITH: RN MIKE FUTRELL BY MESSAN H. AT [id number] ON 05/05/2020 Performed at [facility], [place] [address], [place], [place] [address]    Culture PENDING  Incomplete   Report Status PENDING  Incomplete      Radiology Studies: CT Abdomen Pelvis Wo Contrast  Result Date: 05/04/2020 CLINICAL DATA:  Abdominal distension.  Intermittent abdominal pain. New onset congestive heart failure. EXAM: CT ABDOMEN AND PELVIS WITHOUT CONTRAST TECHNIQUE: Multidetector CT imaging of the abdomen and pelvis was performed following the standard protocol without IV contrast. COMPARISON:  None. FINDINGS: Lower chest: Minimal atelectasis present at the right base. Lungs are otherwise clear. Atherosclerotic calcifications are present in the descending thoracic aorta. Coronary artery calcifications are present. Heart size is normal. No significant pericardial effusion is present. Hepatobiliary: Diffuse fatty infiltration liver is present. Abdominal ascites surround the liver. Liver margin is indistinct. No discrete mass lesion is present. Common bile duct and gallbladder are within normal limits. Pancreas: Unremarkable. No pancreatic ductal dilatation or surrounding inflammatory changes. Spleen: Normal in size without focal abnormality. Adrenals/Urinary Tract: Adrenal glands are normal bilaterally. Kidneys and ureters are unremarkable. No stone or mass lesion is present. No obstruction is present. The urinary bladder is within normal limits. Stomach/Bowel: The stomach and duodenum are within normal limits. Small bowel is unremarkable. Terminal ileum is within normal limits. Ascending and transverse colon are normal. Descending and sigmoid colon are normal. Vascular/Lymphatic: Atherosclerotic calcifications are present in the aorta branch vessels without aneurysm. No significant adenopathy is present. Reproductive: Prostate is unremarkable. Other: Extensive abdominal ascites is noted. No mass lesion is evident. No significant ventral hernia is present. Musculoskeletal: A remote superior endplate fracture and Schmorl's node at L2. Vertebral body heights are otherwise normal. Alignment is anatomic. Fused anterior osteophytes are present in the thoracic spine. No focal lytic or blastic lesions are present. The SI joints are fused bilaterally. Hips are located  and within normal limits bilaterally. IMPRESSION: 1. Extensive abdominal ascites. In the absence of significant pleural effusions or edema, this is most likely hepatic in nature. 2. Diffuse fatty infiltration liver with indistinct margins suggesting cirrhosis. 3. Fusion of the SI joints bilaterally. Question ankylosing spondylitis. 4. Remote superior endplate fracture and Schmorl's node at L2. 5. Aortic Atherosclerosis (ICD10-I70.0). These results were called by telephone at the time of interpretation on 05/04/2020 at 8:17 am to  provider MELANIE BELFI , who verbally acknowledged these results. Electronically Signed   By: San Morelle M.D.   On: 05/04/2020 08:20   DG Chest 2 View  Result Date: 05/03/2020 CLINICAL DATA:  Shortness of breath. EXAM: CHEST - 2 VIEW COMPARISON:  March 13, 2011. FINDINGS: The heart size and mediastinal contours are within normal limits. Both lungs are clear. No pneumothorax or pleural effusion is noted. The visualized skeletal structures are unremarkable. IMPRESSION: No active cardiopulmonary disease. Electronically Signed   By: Marijo Conception M.D.   On: 05/03/2020 12:02   US Paracentesis  Result Date: 05/04/2020 INDICATION: Patient with elevated LFTs presents today with large volume ascites. Interventional radiology asked to perform a therapeutic and diagnostic paracentesis. EXAM: ULTRASOUND GUIDED PARACENTESIS MEDICATIONS: 1% lidocaine 10 mL COMPLICATIONS: None immediate. PROCEDURE: Informed written consent was obtained from the patient after a discussion of the risks, benefits and alternatives to treatment. A timeout was performed prior to the initiation of the procedure. Initial ultrasound scanning demonstrates a large amount of ascites within the right lower abdominal quadrant. The right lower abdomen was prepped and draped in the usual sterile fashion. 1% lidocaine was used for local anesthesia. Following this, a 19 gauge, 7-cm, Yueh catheter was introduced. An  ultrasound image was saved for documentation purposes. The paracentesis was performed. The catheter was removed and a dressing was applied. The patient tolerated the procedure well without immediate post procedural complication. FINDINGS: A total of approximately 5.2 L of clear yellow fluid was removed. Samples were sent to the laboratory as requested by the clinical team. IMPRESSION: Successful ultrasound-guided paracentesis yielding 5.2 liters of peritoneal fluid. Read by: Soyla Dryer, NP Electronically Signed   By: Sandi Mariscal M.D.   On: 05/04/2020 11:08     Scheduled Meds: . brimonidine  1 drop Left Eye BID  . diclofenac Sodium  2 g Topical BID  . dorzolamide-timolol  1 drop Left Eye BID  . fluticasone  1 spray Each Nare Daily  . folic acid  1 mg Oral Daily  . LORazepam  0-4 mg Intravenous Q6H   Followed by  . [START ON 05/06/2020] LORazepam  0-4 mg Intravenous Q12H  . multivitamin with minerals  1 tablet Oral Daily  . pantoprazole  40 mg Oral Daily  . sodium chloride flush  3 mL Intravenous Q12H  . thiamine  100 mg Oral Daily   Or  . thiamine  100 mg Intravenous Daily   Continuous Infusions: . sodium chloride 250 mL (05/05/20 0845)  . cefTRIAXone (ROCEPHIN)  IV 2 g (05/05/20 0850)     LOS: 1 day   Time Spent in minutes   45 minutes  Blakelyn Dinges D.O. on 05/05/2020 at 8:58 AM  Between 7am to 7pm - Please see pager noted on [email or web address]  After 7pm go to [email or web address]  And look for the night coverage person covering for me after hours  [facility] Office  [phone]

## 2020-05-05 NOTE — Progress Notes (Signed)
MD, Pt has a Living will and POA, and pt wants to be a partial code with no intubation, please address, thanks [place].

## 2020-05-05 NOTE — Progress Notes (Signed)
MD, pt had a  paracentesis earlier, the fluid had gram negative rods in it, MD notified, will continue to monitor, Thanks Arvella Nigh RN.

## 2020-05-05 NOTE — Progress Notes (Signed)
The patient feels better.  Quite coherent, not overtly encephalopathic.  Nephrology note reviewed.  Renal function improving.  Liver chemistries moderately improved (?  Resolving alcoholic hepatitis versus improving reactive hepatopathy).  Gram-negative rods on Gram stain of peritoneal fluid; unfortunately, a cell count was not obtained.  Chemistries on the fluid are not clearly diagnostic of SBP.  However, agree with initiation of treatment with antibiotics.  According to the chart, the patient has GAINED 6 pounds since yesterday, which seems unlikely.  (?  Accuracy of measurement).  Impression:  1.  Advanced liver disease with portal hypertension manifesting as fluid retention (severe edema and ascites).  2.  Azotemia, improving  3.  Possible SBP, under treatment  Recommendations:  We will follow the patient with you.  No change in management at present.  We will see how his weight and his edema respond to the albumin/Lasix regimen per nephrology.  Would consider repeat large-volume paracentesis, checking a cell count, approximately 4 days from now to assess response to antibiotic therapy.  Cleotis Nipper, M.D. Pager [phone] If no answer or after 5 PM call [phone]

## 2020-05-06 ENCOUNTER — Encounter (HOSPITAL_COMMUNITY): Payer: Self-pay | Admitting: Internal Medicine

## 2020-05-06 DIAGNOSIS — R601 Generalized edema: Secondary | ICD-10-CM | POA: Diagnosis not present

## 2020-05-06 DIAGNOSIS — I10 Essential (primary) hypertension: Secondary | ICD-10-CM | POA: Diagnosis not present

## 2020-05-06 DIAGNOSIS — N179 Acute kidney failure, unspecified: Secondary | ICD-10-CM | POA: Diagnosis not present

## 2020-05-06 DIAGNOSIS — R0600 Dyspnea, unspecified: Secondary | ICD-10-CM | POA: Diagnosis not present

## 2020-05-06 LAB — COMPREHENSIVE METABOLIC PANEL
ALT: 39 U/L (ref 0–44)
AST: 77 U/L — ABNORMAL HIGH (ref 15–41)
Albumin: 2.8 g/dL — ABNORMAL LOW (ref 3.5–5.0)
Alkaline Phosphatase: 148 U/L — ABNORMAL HIGH (ref 38–126)
Anion gap: 12 (ref 5–15)
BUN: 23 mg/dL (ref 8–23)
CO2: 23 mmol/L (ref 22–32)
Calcium: 8 mg/dL — ABNORMAL LOW (ref 8.9–10.3)
Chloride: 106 mmol/L (ref 98–111)
Creatinine, Ser: 2.15 mg/dL — ABNORMAL HIGH (ref 0.61–1.24)
GFR, Estimated: 30 mL/min — ABNORMAL LOW (ref >60)
Glucose, Bld: 152 mg/dL — ABNORMAL HIGH (ref 70–99)
Potassium: 3.1 mmol/L — ABNORMAL LOW (ref 3.5–5.1)
Sodium: 141 mmol/L (ref 135–145)
Total Bilirubin: 2.2 mg/dL — ABNORMAL HIGH (ref 0.3–1.2)
Total Protein: 5.3 g/dL — ABNORMAL LOW (ref 6.5–8.1)

## 2020-05-06 LAB — CBC
HCT: 30.6 % — ABNORMAL LOW (ref 39.0–52.0)
Hemoglobin: 10.8 g/dL — ABNORMAL LOW (ref 13.0–17.0)
MCH: 37.6 pg — ABNORMAL HIGH (ref 26.0–34.0)
MCHC: 35.3 g/dL (ref 30.0–36.0)
MCV: 106.6 fL — ABNORMAL HIGH (ref 80.0–100.0)
Platelets: 131 10*3/uL — ABNORMAL LOW (ref 150–400)
RBC: 2.87 MIL/uL — ABNORMAL LOW (ref 4.22–5.81)
RDW: 14.6 % (ref 11.5–15.5)
WBC: 5.9 10*3/uL (ref 4.0–10.5)
nRBC: 0 % (ref 0.0–0.2)

## 2020-05-06 LAB — PH, BODY FLUID: pH, Body Fluid: 7.5

## 2020-05-06 LAB — BODY FLUID CELL COUNT WITH DIFFERENTIAL
Lymphs, Fluid: 30 %
Monocyte-Macrophage-Serous Fluid: 67 % (ref 50–90)
Neutrophil Count, Fluid: 3 % (ref 0–25)
Total Nucleated Cell Count, Fluid: 49 cu mm (ref 0–1000)

## 2020-05-06 MED ORDER — ALBUMIN HUMAN 25 % IV SOLN
50.0000 g | Freq: Once | INTRAVENOUS | Status: AC
  Administered 2020-05-06: 50 g via INTRAVENOUS
  Filled 2020-05-06: qty 200

## 2020-05-06 MED ORDER — FUROSEMIDE 10 MG/ML IJ SOLN
40.0000 mg | Freq: Once | INTRAMUSCULAR | Status: AC
  Administered 2020-05-06: 40 mg via INTRAVENOUS
  Filled 2020-05-06: qty 4

## 2020-05-06 MED ORDER — POTASSIUM CHLORIDE CRYS ER 20 MEQ PO TBCR
40.0000 meq | EXTENDED_RELEASE_TABLET | Freq: Once | ORAL | Status: AC
  Administered 2020-05-06: 40 meq via ORAL
  Filled 2020-05-06: qty 2

## 2020-05-06 MED ORDER — LACTULOSE 10 GM/15ML PO SOLN
20.0000 g | Freq: Every day | ORAL | Status: DC
  Administered 2020-05-06: 20 g via ORAL
  Filled 2020-05-06 (×2): qty 30

## 2020-05-06 NOTE — Progress Notes (Signed)
[facility] Gastroenterology Progress Note  Randy Wood 80 y.o. [birth year]  CC:  Decompensated cirrhosis, suspected SBP  Subjective: Patient reports feeling better today.  Had mild periumbilical pain overnight, which has now resolved.  Reports a small bowel movement overnight without melena or hematochezia, though states it was an incomplete BM, and he tends to be constipated.  He denies nausea or vomiting.  ROS : Review of Systems  Cardiovascular: Negative for chest pain and palpitations.  Gastrointestinal: Positive for constipation. Negative for abdominal pain, blood in stool, diarrhea, heartburn, melena, nausea and vomiting.   Objective: Vital signs in last 24 hours: Vitals:   05/05/20 2326 05/06/20 0500  BP: (!) 119/48 119/64  Pulse: 94 88  Resp: 18 18  Temp: 98.3 F (36.8 C) 99.2 F (37.3 C)  SpO2: 98% 98%    Physical Exam:  General:  Alert, oriented, cooperative, no distress  Head:  Normocephalic, without obvious abnormality, atraumatic  Eyes:  Mild scleral icterus, EOMs intact  Lungs:   Clear to auscultation bilaterally, respirations unlabored  Heart:  Regular rate and rhythm, S1, S2 normal  Abdomen:   Moderately distended but non-tender with bowel sounds active all four quadrants, no guarding or peritoneal signs  Extremities: Mild bilateral lower extremity edema  Neuro: Alert and oriented, no asterixis     Lab Results: Recent Labs    05/05/20 0411 05/06/20 0215  NA 139 141  K 3.4* 3.1*  CL 106 106  CO2 21* 23  GLUCOSE 136* 152*  BUN 28* 23  CREATININE 2.48* 2.15*  CALCIUM 8.0* 8.0*  MG 1.6*  --   PHOS 2.3*  --    Recent Labs    05/05/20 0411 05/06/20 0215  AST 77* 77*  ALT 45* 39  ALKPHOS 159* 148*  BILITOT 2.3* 2.2*  PROT 5.3* 5.3*  ALBUMIN 2.7* 2.8*   Recent Labs    05/05/20 0411 05/06/20 0215  WBC 5.8 5.9  HGB 10.5* 10.8*  HCT 31.1* 30.6*  MCV 109.5* 106.6*  PLT 131* 131*   Recent Labs    05/04/20 0902 05/05/20 0411  LABPROT 15.4*  15.8*  INR 1.3* 1.3*      Assessment: Decompensated cirrhosis, suspected SBP -T. Bili 2.2/ AST 77/ ALT 39/ ALP 148 -No leukocytosis (WBCs 5.9)  AKI, improving: BUN 23/ Cr 2.15  Mild normocytic anemia: Hgb 10.8, stable. No signs of GI bleeding.  Plan: Cell count added to prior specimen from 2/5 paracentesis, pending.  Continue empiric antibiotics.  Repeat paracentesis in 2-3 days to reassess cell count/culture/gram stain.  However, if distention worsens, we may need to complete this sooner.  Patient will need prophylactic antibiotics at discharge indefinitely, given SBP.  Start lactulose 20g daily for constipation.  Titrate for 2-3 soft BMs per day.  Advised patient not to take magnesium citrate, given concerns for renal impairment.  He verbalized understanding.  [facility] will follow.  Salley Slaughter PA-C 05/06/2020, 9:03 AM  Contact #  [phone]

## 2020-05-06 NOTE — Plan of Care (Signed)
  Problem: Skin Integrity: Goal: Risk for impaired skin integrity will decrease Outcome: Completed/Met

## 2020-05-06 NOTE — Evaluation (Signed)
Occupational Therapy Evaluation Patient Details Name: Randy Wood MRN: [id number] DOB: 1941/01/04 Today's Date: 05/06/2020    History of Present Illness Patient is a 80 y/o male who presents with SOB, swelling. Admitted with new onset of CHF and anasarca secondary to hepatic failure. CT abd-ascites with infiltration of liver conerning for cirrhosis. s/p paracentesis 2/5. PMH includes macular degeneration, alcohol abuse and HTN.   Clinical Impression   Pt admitted with the above diagnosis and has the deficits listed below. Pt would benefit from cont OT to increase independence with adls and adl mobility to a mod I level so pt can eventually dc home alone.  Pt currently lives alone but has not left house much since [place] as he is fearful of it.  Pt has become debilitated over the last 6 months and had a fall off the toilet.  Feel this pt may benefit from a quick stay on rehab to get a "tune up" and increase his safety and get him back to his baseline from November.  Daughter would like to talk to someone about his alcoholism and see what resources are available for him when he transitions home.  Pt's alcohol does increase his safety and fall risk. Will continue to see with focus on adls and fall prevention during adls.      Follow Up Recommendations  CIR;Supervision/Assistance - 24 hour    Equipment Recommendations  None recommended by OT    Recommendations for Other Services Rehab consult     Precautions / Restrictions Precautions Precautions: Fall;Other (comment) Precaution Comments: pitting edema BLE, ascites Restrictions Weight Bearing Restrictions: No      Mobility Bed Mobility Overal bed mobility: Needs Assistance Bed Mobility: Supine to Sit     Supine to sit: Min assist;HOB elevated     General bed mobility comments: assist getting trunk straight in bed.    Transfers Overall transfer level: Needs assistance Equipment used: Rolling walker (2 wheeled) Transfers: Sit  to/from Stand Sit to Stand: Mod assist         General transfer comment: Mod A to stand from EOB with cues for hand placement/technique, unable to stand without assist as pt pulling up on RW.    Balance Overall balance assessment: Needs assistance Sitting-balance support: Feet supported;No upper extremity supported Sitting balance-Leahy Scale: Good     Standing balance support: During functional activity Standing balance-Leahy Scale: Poor Standing balance comment: pt very reliant on outside support to stand. Pt is very proud and does not walk to use cane or walker.  Had long discussion about how a walker is necessary at this time to avoid falls with the hopes of weaning off of it.                           ADL either performed or assessed with clinical judgement   ADL Overall ADL's : Needs assistance/impaired Eating/Feeding: Set up;Sitting   Grooming: Wash/dry hands;Wash/dry face;Oral care;Min guard;Standing Grooming Details (indicate cue type and reason): limited standing at sink due to fatigue. Upper Body Bathing: Set up;Sitting   Lower Body Bathing: Minimal assistance;Sit to/from stand;Cueing for compensatory techniques Lower Body Bathing Details (indicate cue type and reason): min assist when in standing to maintain balance. Upper Body Dressing : Set up;Sitting   Lower Body Dressing: Minimal assistance;Sit to/from stand;Cueing for compensatory techniques Lower Body Dressing Details (indicate cue type and reason): Min asisst needed in standing due to poor balance and pt gets very SOB  when on his feet for any length of time. Toilet Transfer: Minimal assistance;Ambulation;RW;BSC;Comfort height toilet;Grab bars Toilet Transfer Details (indicate cue type and reason): Pt walked to bathorom with walker and required min assist due to balance and fatigue. Toileting- Clothing Manipulation and Hygiene: Minimal assistance;Sit to/from stand       Functional mobility during  ADLs: Minimal assistance;Rolling walker General ADL Comments: Pt limited with adls due to fatigue, SOB and decreased balance.     Vision Baseline Vision/History: Macular Degeneration Patient Visual Report: No change from baseline Vision Assessment?: No apparent visual deficits     Perception Perception Perception Tested?: No   Praxis      Pertinent Vitals/Pain Pain Assessment: No/denies pain     Hand Dominance Right   Extremity/Trunk Assessment Upper Extremity Assessment Upper Extremity Assessment: Overall WFL for tasks assessed   Lower Extremity Assessment Lower Extremity Assessment: Defer to PT evaluation   Cervical / Trunk Assessment Cervical / Trunk Assessment: Normal   Communication Communication Communication: No difficulties   Cognition Arousal/Alertness: Awake/alert Behavior During Therapy: Flat affect Overall Cognitive Status: Impaired/Different from baseline Area of Impairment: Orientation;Safety/judgement;Awareness;Problem solving                 Orientation Level: Time       Safety/Judgement: Decreased awareness of safety;Decreased awareness of deficits Awareness: Emergent Problem Solving: Slow processing;Decreased initiation;Requires verbal cues General Comments: Pt's baseline from November changed drastically in Dec when pt decided to stay inside at all times due to Covid ramping up.  Pt not clear about date but did answer other basic questions correctly.  Do feel there is a lack of awareness.  Pt does drink 3-5 drinks (vodka and wine)daily which adds greatly to the fall risk.  Pt's daughter wondering if someone from Social Work can talk to her about options for his alcholism.   General Comments  Pt limited by cognition, SOB, decreased endurance and balance when on his feet during adls. Pt lives alone.  Would consider a short rehab stay for this pt to increase independence with adls adn adl mobility to level of mod I.  Family from [facility] can stay  with him for a short while after rehab but cannot stay longer.  Feel this may be doable with rehab.  Do need to give daughter resources for his alcholism.    Exercises     Shoulder Instructions      Home Living Family/patient expects to be discharged to:: Private residence Living Arrangements: Alone Available Help at Discharge: Family;Available PRN/intermittently Type of Home: House Home Access: Stairs to enter [employer] of Steps: 2 Entrance Stairs-Rails: None Home Layout: One level     Bathroom Shower/Tub: Occupational psychologist: Standard     Home Equipment: Cane - single point;Bedside commode;Shower seat - built in   Additional Comments: Pt just got BSC for over commode.  Pt has had recent falls at home.      Prior Functioning/Environment Level of Independence: Needs assistance  Gait / Transfers Assistance Needed: Pt used to walk indoor and outdoor regularly.  Once this last bout of COVID hit, pt became fearful of getting it and stayed inside and has become weaker.  Pt no longer walks outdoors. Has a cane but too proud to use it (per daughter) and has taken recent falls in the house. ADL's / Homemaking Assistance Needed: Pt as food delivered.  has not left home much since Dec and states he can not get in and out by  himself because he has steps.   Comments: Pt limited with adls and has not driven since early December.  Has a cane but does not use it as he should, per daughter.        OT Problem List: Decreased activity tolerance;Impaired balance (sitting and/or standing);Decreased safety awareness;Decreased cognition;Decreased knowledge of use of DME or AE;Increased edema      OT Treatment/Interventions: Self-care/ADL training;Therapeutic activities;DME and/or AE instruction    OT Goals(Current goals can be found in the care plan section) Acute Rehab OT Goals Patient Stated Goal: to be independent OT Goal Formulation: With patient Time For Goal  Achievement: 05/20/20 Potential to Achieve Goals: Good ADL Goals Pt Will Perform Grooming: with modified independence;standing Pt Will Perform Lower Body Bathing: with supervision;sit to/from stand Pt Will Perform Lower Body Dressing: with modified independence;sit to/from stand Pt Will Perform Tub/Shower Transfer: Shower transfer;with supervision;ambulating;rolling walker Additional ADL Goal #1: Pt will walk to bathroom with walker and complete all toileting with mod I with 3:1 over commode. Additional ADL Goal #2: Pt will state two  new things he can do at home to conserve energy and make adls easier without VCs.  OT Frequency: Min 2X/week   Barriers to D/C: Decreased caregiver support  has daughters that can assist for a short while in transition.       Co-evaluation              AM-PAC OT "6 Clicks" Daily Activity     Outcome Measure Help from another person eating meals?: None Help from another person taking care of personal grooming?: A Little Help from another person toileting, which includes using toliet, bedpan, or urinal?: A Little Help from another person bathing (including washing, rinsing, drying)?: A Little Help from another person to put on and taking off regular upper body clothing?: A Little Help from another person to put on and taking off regular lower body clothing?: A Little 6 Click Score: 19   End of Session Equipment Utilized During Treatment: Rolling walker Nurse Communication: Mobility status  Activity Tolerance: Patient limited by fatigue Patient left: in chair;with call bell/phone within reach;with chair alarm set  OT Visit Diagnosis: Unsteadiness on feet (R26.81)                Time: 6546-5035 OT Time Calculation (min): 44 min Charges:  OT General Charges $OT Visit: 1 Visit OT Evaluation $OT Eval Moderate Complexity: 1 Mod OT Treatments $Self Care/Home Management : 8-22 mins  Glenford Peers 05/06/2020, 11:31 AM

## 2020-05-06 NOTE — Progress Notes (Signed)
PROGRESS NOTE    Randy Wood  DEY:[id number] DOB: 25-Feb-1941 DOA: 05/03/2020 PCP: Eunice Blase, MD   Brief Narrative:  HPI on 05/04/2020 by Dr. Fuller Plan Randy Wood is a 80 y.o. male with medical history significant of hypertension, hyperlipidemia, alcohol abuse presents with complaints of shortness of breath and loss of strength in his leg.  Since the middle of September 2021 he reports that he has had progressively worsening swelling of his legs that now goes up into his thighs.  He has had shortness of breath on exertion.  At this time even trying to get dressed he is out of breath.  He has been seen by his primary care provider and had recently done labs which gave concern for the possibility of DVT.  Patient had a Doppler ultrasound of his lower extremities performed yesterday that was negative.  He is unsure of how much weight he has put on, but states that it is hard for him to pick up his legs he feels so heavy and his stomach has been getting more distended.  With the leg swelling it has also caused significant knee and joint pain.  He had been taking ibuprofen 800 mg twice daily up until the middle of December 2021.  Question if he had a urinary tract infection because his urine had changed to a more dark color.  Patient had not been tried on any diuretics.  Previously, when on hydrochlorothiazide and losartan some years ago for blood pressure had had several falls.  He does admit that he drinks 3 drinks of vodka and tonic water daily.  Usually each drink is made up of 1 ounce of vodka to 7 ounces of tonic water.  Denies having any fevers, cough, dysuria, confusion, or chest pain symptoms.   Interim history Patient admitted with decompensated liver disease, with significant ascites along with renal failure.  GI and nephrology consulted and appreciated. Assessment & Plan   Decompensated liver disease with anasarca -Patient complained of progressively worsening abdominal and lower  extremity swelling -CT imaging showed extensive ascites and concern for cirrhosis -Meld score 23 on admission -Suspect liver failure secondary to alcohol although patient tells me his last use was approximately 1 month ago -Ultrasound-guided paracentesis yielded 5.2 L of clear yellow fluid -?Hepatorenal syndrome given azotemia -Gastroenterology and nephrology consulted and appreciated -Nephrology planning on continuing IV albumin as well as Lasix to help promote diuresis and volume unloading today -Peritoneal fluid Gram stain shows gram-negative rods -Patient started on ceftriaxone -Will likely obtain abd [place] on 2/8 -placed on lactulose  Acute kidney injury -Creatinine on admission was 2.8, down to 2.15 today.   -Creatinine 2020 was 0.84, however 2 days prior to admission, creatinine was 1.53.  Unknown true baseline. -Patient had been using ibuprofen although states he is not used it since December -As above ?  Hepatorenal syndrome -Treatment and plan as above -Nephrology consulted and appreciated -Avoid nephrotoxic agents -Continue to monitor BMP  Dyspnea on exertion -Patient currently on room air -BNP was elevated 237.7 -Chest x-ray unremarkable for infection or edema -Suspect secondary to the above -Patient does feel his breathing has improved today although has not been out of bed. -Continue incentive spirometry  Lower extremity weakness -Patient states he has been unable to get up and walk around due to weakness in his legs.  Suspect this is secondary to significant lower extremity edema -PT consulted and recommended home health  Essential hypertension -Home medications of amlodipine and propranolol were  held due to paracentesis and possible fluid shift -Will continue to monitor blood pressure and restart medications as needed  Macrocytic anemia -Hemoglobin 10.8 today, continue to monitor CBC -Vitamin B12 1696, Folate 29.7  Osteoarthritis, acute on chronic -Patient  complains of joint pains -Continue Voltaren gel as needed  Transaminitis with hyperbilirubinemia -Likely secondary to the above -Hepatitis panel nonreactive  Alcohol abuse -Patient states he has not had any alcohol over the last month although reported to the manner that he was drinking 3 drinks of vodka and tonic water a day -Continue CIWA protocol with Ativan -Discussed cessation  GERD -Continue PPI  Hypokalemia -K 3.1, continue to replace and monitor   DVT Prophylaxis  SCDs  Code Status: Full  Family Communication: None at bedside. Daughter at bedside.  Disposition Plan:  Status is: Inpatient  Remains inpatient appropriate because:Inpatient level of care appropriate due to severity of illness   Dispo: The patient is from: Home              Anticipated d/c is to: Home              Anticipated d/c date is: > 3 days              Patient currently is not medically stable to d/c.   Difficult to place patient No   Consultants Nephrology Gastroenterology  Procedures  Ultrasound-guided paracentesis  Antibiotics   Anti-infectives (From admission, onward)   Start     Dose/Rate Route Frequency Ordered Stop   05/05/20 0500  cefTRIAXone (ROCEPHIN) 2 g in sodium chloride 0.9 % 100 mL IVPB        2 g 200 mL/hr over 30 Minutes Intravenous Every 24 hours 05/05/20 0413        Subjective:   Randy Wood seen and examined today.  Patient feels improved today as compared to previous days. Feels his abdomen is getting large again. Breathing has improved. Denies current chest pain, N/V/D/C, dizziness, headache.   Objective:   Vitals:   05/05/20 2326 05/06/20 0200 05/06/20 0500 05/06/20 1049  BP: (!) 119/48  119/64 (!) 98/53  Pulse: 94  88 97  Resp: 18  18   Temp: 98.3 F (36.8 C)  99.2 F (37.3 C)   TempSrc: Oral  Oral   SpO2: 98%  98%   Weight:  101.6 kg    Height:        Intake/Output Summary (Last 24 hours) at 05/06/2020 1102 Last data filed at 05/06/2020  0849 Gross per 24 hour  Intake 841.1 ml  Output 626 ml  Net 215.1 ml   Filed Weights   05/04/20 0726 05/05/20 0200 05/06/20 0200  Weight: 100.7 kg 103.8 kg 101.6 kg    Exam  General: Well developed, chronically ill-appearing, NAD  HEENT: NCAT, mucous membranes moist.   Cardiovascular: S1 S2 auscultated, RRR, SEM  Respiratory: Diminished however clear (anteriorly)   Abdomen: Firm, obese, nontender, distended, + bowel sounds  Extremities: warm dry without cyanosis clubbing, 2+ LE edema B/L  Neuro: AAOx3, nonfocal  Psych: appropriate mood and affect, pleasant    Data Reviewed: I have personally reviewed following labs and imaging studies  CBC: Recent Labs  Lab 05/01/20 1348 05/03/20 1152 05/05/20 0411 05/06/20 0215  WBC 8.6 7.3 5.8 5.9  NEUTROABS 5,865  --   --   --   HGB 12.1* 12.2* 10.5* 10.8*  HCT 35.2* 36.7* 31.1* 30.6*  MCV 107.3* 110.2* 109.5* 106.6*  PLT 208 182 131* 131*  Basic Metabolic Panel: Recent Labs  Lab 05/01/20 1348 05/03/20 1152 05/05/20 0411 05/06/20 0215  NA 139 137 139 141  K 4.2 3.9 3.4* 3.1*  CL 100 101 106 106  CO2 20 19* 21* 23  GLUCOSE 98 151* 136* 152*  BUN 21 26* 28* 23  CREATININE 1.53* 2.80* 2.48* 2.15*  CALCIUM 8.8 8.5* 8.0* 8.0*  MG  --   --  1.6*  --   PHOS  --   --  2.3*  --    GFR: Estimated Creatinine Clearance: 33.3 mL/min (A) (by C-G formula based on SCr of 2.15 mg/dL (H)). Liver Function Tests: Recent Labs  Lab 05/01/20 1348 05/04/20 0753 05/05/20 0411 05/06/20 0215  AST 145* 104* 77* 77*  ALT 78* 64* 45* 39  ALKPHOS  --  212* 159* 148*  BILITOT 3.2* 3.7* 2.3* 2.2*  PROT 5.8* 6.3* 5.3* 5.3*  ALBUMIN  --  2.7* 2.7* 2.8*   No results for input(s): LIPASE, AMYLASE in the last 168 hours. Recent Labs  Lab 05/05/20 1016  AMMONIA 47*   Coagulation Profile: Recent Labs  Lab 05/04/20 0902 05/05/20 0411  INR 1.3* 1.3*   Cardiac Enzymes: No results for input(s): CKTOTAL, CKMB, CKMBINDEX, TROPONINI  in the last 168 hours. BNP (last 3 results) No results for input(s): PROBNP in the last 8760 hours. HbA1C: No results for input(s): HGBA1C in the last 72 hours. CBG: No results for input(s): GLUCAP in the last 168 hours. Lipid Profile: No results for input(s): CHOL, HDL, LDLCALC, TRIG, CHOLHDL, LDLDIRECT in the last 72 hours. Thyroid Function Tests: No results for input(s): TSH, T4TOTAL, FREET4, T3FREE, THYROIDAB in the last 72 hours. Anemia Panel: Recent Labs    05/05/20 1016  VITAMINB12 1,696*  FOLATE 29.7   Urine analysis:    Component Value Date/Time   COLORURINE AMBER (A) 05/04/2020 1555   APPEARANCEUR CLOUDY (A) 05/04/2020 1555   LABSPEC 1.020 05/04/2020 1555   PHURINE 5.0 05/04/2020 1555   GLUCOSEU NEGATIVE 05/04/2020 1555   HGBUR NEGATIVE 05/04/2020 1555   BILIRUBINUR SMALL (A) 05/04/2020 1555   KETONESUR 5 (A) 05/04/2020 1555   PROTEINUR 30 (A) 05/04/2020 1555   NITRITE NEGATIVE 05/04/2020 1555   LEUKOCYTESUR MODERATE (A) 05/04/2020 1555   Sepsis Labs: @LABRCNTIP (procalcitonin:4,lacticidven:4)  ) Recent Results (from the past 240 hour(s))  SARS Coronavirus 2 by RT PCR (hospital order, performed in [place] hospital lab) Nasopharyngeal Nasopharyngeal Swab     Status: None   Collection Time: 05/04/20  7:53 AM   Specimen: Nasopharyngeal Swab  Result Value Ref Range Status   SARS Coronavirus 2 NEGATIVE NEGATIVE Final    Comment: (NOTE) SARS-CoV-2 target nucleic acids are NOT DETECTED.  The SARS-CoV-2 RNA is generally detectable in upper and lower respiratory specimens during the acute phase of infection. The lowest concentration of SARS-CoV-2 viral copies this assay can detect is 250 copies / mL. A negative result does not preclude SARS-CoV-2 infection and should not be used as the sole basis for treatment or other patient management decisions.  A negative result may occur with improper specimen collection / handling, submission of specimen other than  nasopharyngeal swab, presence of viral mutation(s) within the areas targeted by this assay, and inadequate number of viral copies (<250 copies / mL). A negative result must be combined with clinical observations, patient history, and epidemiological information.  Fact Sheet for Patients:   StrictlyIdeas.no  Fact Sheet for Healthcare Providers: BankingDealers.co.za  This test is not yet approved or  cleared  by the [place] and has been authorized for detection and/or diagnosis of SARS-CoV-2 by FDA under an Emergency Use Authorization (EUA).  This EUA will remain in effect (meaning this test can be used) for the duration of the COVID-19 declaration under Section 564(b)(1) of the Act, 21 U.S.C. section 360bbb-3(b)(1), unless the authorization is terminated or revoked sooner.  Performed at [facility], [address] [place], [place] [address]   Gram stain     Status: None   Collection Time: 05/04/20 11:14 AM   Specimen: Abdomen; Peritoneal Fluid  Result Value Ref Range Status   Specimen Description ABDOMEN  Final   Special Requests PERITONEAL FLUID  Final   Gram Stain   Final    WBC PRESENT,BOTH PMN AND MONONUCLEAR NO ORGANISMS SEEN CYTOSPIN SMEAR Performed at [facility], [address] [place], [place] [address]    Report Status 05/04/2020 FINAL  Final  Culture, body fluid-bottle     Status: None (Preliminary result)   Collection Time: 05/04/20 11:14 AM   Specimen: Abdomen  Result Value Ref Range Status   Specimen Description ABDOMEN PERITONEAL FLUID  Final   Special Requests   Final    BOTTLES DRAWN AEROBIC ONLY Blood Culture adequate volume   Gram Stain   Final    GRAM NEGATIVE RODS AEROBIC BOTTLE ONLY CRITICAL RESULT CALLED TO, READ BACK BY AND VERIFIED WITH: RN MIKE FUTRELL BY MESSAN H. AT 0340 ON 05/05/2020 Performed at [facility], [address] [place], [place] [address]    Culture  GRAM NEGATIVE RODS  Final   Report Status PENDING  Incomplete      Radiology Studies: US Paracentesis  Result Date: 05/04/2020 INDICATION: Patient with elevated LFTs presents today with large volume ascites. Interventional radiology asked to perform a therapeutic and diagnostic paracentesis. EXAM: ULTRASOUND GUIDED PARACENTESIS MEDICATIONS: 1% lidocaine 10 mL COMPLICATIONS: None immediate. PROCEDURE: Informed written consent was obtained from the patient after a discussion of the risks, benefits and alternatives to treatment. A timeout was performed prior to the initiation of the procedure. Initial ultrasound scanning demonstrates a large amount of ascites within the right lower abdominal quadrant. The right lower abdomen was prepped and draped in the usual sterile fashion. 1% lidocaine was used for local anesthesia. Following this, a 19 gauge, 7-cm, Yueh catheter was introduced. An ultrasound image was saved for documentation purposes. The paracentesis was performed. The catheter was removed and a dressing was applied. The patient tolerated the procedure well without immediate post procedural complication. FINDINGS: A total of approximately 5.2 L of clear yellow fluid was removed. Samples were sent to the laboratory as requested by the clinical team. IMPRESSION: Successful ultrasound-guided paracentesis yielding 5.2 liters of peritoneal fluid. Read by: Alwyn Ren, NP Electronically Signed   By: Simonne Come M.D.   On: 05/04/2020 11:08     Scheduled Meds: . brimonidine  1 drop Left Eye BID  . diclofenac Sodium  2 g Topical BID  . dorzolamide-timolol  1 drop Left Eye BID  . fluticasone  1 spray Each Nare Daily  . folic acid  1 mg Oral Daily  . furosemide  40 mg Intravenous Once  . lactulose  20 g Oral Daily  . LORazepam  0-4 mg Intravenous Q12H  . multivitamin with minerals  1 tablet Oral Daily  . pantoprazole  40 mg Oral Daily  . sodium chloride flush  3 mL Intravenous Q12H  . thiamine   100 mg  Oral Daily   Or  . thiamine  100 mg Intravenous Daily   Continuous Infusions: . sodium chloride 250 mL (05/05/20 0845)  . cefTRIAXone (ROCEPHIN)  IV 2 g (05/06/20 4259)     LOS: 2 days   Time Spent in minutes   45 minutes  Tyiana Hill D.O. on 05/06/2020 at 11:02 AM  Between 7am to 7pm - Please see pager noted on [email or web address]  After 7pm go to [email or web address]  And look for the night coverage person covering for me after hours  [facility] Office  [phone]

## 2020-05-06 NOTE — Progress Notes (Signed)
MD, pt's daughter wanted u to call her about his tx plan, his daughter is Ted Mcalpine [phone], thanks Arvella Nigh RN

## 2020-05-06 NOTE — Progress Notes (Signed)
Rehab Admissions Coordinator Note:  Patient was screened by Cleatrice Burke for appropriateness for an Inpatient Acute Rehab Consult per OT recs. PT noted to recommend [facility]. If patient fails to progress to go home alone, may consider IP rehab consult.  Cleatrice Burke RN MSN 05/06/2020, 7:33 PM  I can be reached at [phone].

## 2020-05-06 NOTE — Progress Notes (Addendum)
[facility] NEPHROLOGY PROGRESS NOTE  Assessment/ Plan: Pt is a 80 y.o. yo male with HTN, HLD, alcohol abuse admitted with anasarca, dyspnea on exertion and AKI.  # Acute kidney injury:  Due to HRS and acute worsening related with SBP/peritonitis. Urinalysis with UTI/cloudy, minimal protein. Urine sodium <10. CT scan ruled out hydronephrosis. Urine output increasing and renal function continue to improve. Lower extremity edema is much better. Complaining of some shortness of breath therefore I will order another dose of IV Lasix with albumin. Hopefully we can switch to oral Lasix tomorrow.  #Decompensated liver disease:Likely alcoholic liver disease/cirrhosis now with evidence of SBP (atypical clinical presentation). GI is following.  #Hypertension: Continue to hold antihypertensive therapy and restart propranolol if needed for portal hypertension.  # Pedal edema/ascites:Secondary to decompensated liver disease/hypoalbuminemia in the setting of worsening renal function and previous NSAID use. Edema is much better today but he still having some SOP therefore order another dose of  intravenous albumin and furosemide today.  If therapeutic paracentesis is ordered, he will need intravenous albumin (50 g of 25%) if >5 L ascitic fluid drawn.  #Hypokalemia: Replacing potassium chloride. Monitor lab.  Subjective: Seen and examined. Urine output around 725 cc. Reports some shortness of breath and wheezing. Denies chest pain, nausea vomiting headache. His daughter at bedside. Objective Vital signs in last 24 hours: Vitals:   05/05/20 2000 05/05/20 2326 05/06/20 0200 05/06/20 0500  BP: (!) 117/59 (!) 119/48  119/64  Pulse: 93 94  88  Resp: 18 18  18   Temp: 98.1 F (36.7 C) 98.3 F (36.8 C)  99.2 F (37.3 C)  TempSrc: Oral Oral  Oral  SpO2: 98% 98%  98%  Weight:   101.6 kg   Height:       Weight change: 0.907 kg  Intake/Output Summary (Last 24 hours) at 05/06/2020  0903 Last data filed at 05/06/2020 0849 Gross per 24 hour  Intake 841.1 ml  Output 726 ml  Net 115.1 ml       Labs: Basic Metabolic Panel: Recent Labs  Lab 05/03/20 1152 05/05/20 0411 05/06/20 0215  NA 137 139 141  K 3.9 3.4* 3.1*  CL 101 106 106  CO2 19* 21* 23  GLUCOSE 151* 136* 152*  BUN 26* 28* 23  CREATININE 2.80* 2.48* 2.15*  CALCIUM 8.5* 8.0* 8.0*  PHOS  --  2.3*  --    Liver Function Tests: Recent Labs  Lab 05/04/20 0753 05/05/20 0411 05/06/20 0215  AST 104* 77* 77*  ALT 64* 45* 39  ALKPHOS 212* 159* 148*  BILITOT 3.7* 2.3* 2.2*  PROT 6.3* 5.3* 5.3*  ALBUMIN 2.7* 2.7* 2.8*   No results for input(s): LIPASE, AMYLASE in the last 168 hours. Recent Labs  Lab 05/05/20 1016  AMMONIA 47*   CBC: Recent Labs  Lab 05/01/20 1348 05/03/20 1152 05/05/20 0411 05/06/20 0215  WBC 8.6 7.3 5.8 5.9  NEUTROABS 5,865  --   --   --   HGB 12.1* 12.2* 10.5* 10.8*  HCT 35.2* 36.7* 31.1* 30.6*  MCV 107.3* 110.2* 109.5* 106.6*  PLT 208 182 131* 131*   Cardiac Enzymes: No results for input(s): CKTOTAL, CKMB, CKMBINDEX, TROPONINI in the last 168 hours. CBG: No results for input(s): GLUCAP in the last 168 hours.  Iron Studies: No results for input(s): IRON, TIBC, TRANSFERRIN, FERRITIN in the last 72 hours. Studies/Results: US Paracentesis  Result Date: 05/04/2020 INDICATION: Patient with elevated LFTs presents today with large volume ascites. Interventional radiology asked  to perform a therapeutic and diagnostic paracentesis. EXAM: ULTRASOUND GUIDED PARACENTESIS MEDICATIONS: 1% lidocaine 10 mL COMPLICATIONS: None immediate. PROCEDURE: Informed written consent was obtained from the patient after a discussion of the risks, benefits and alternatives to treatment. A timeout was performed prior to the initiation of the procedure. Initial ultrasound scanning demonstrates a large amount of ascites within the right lower abdominal quadrant. The right lower abdomen was prepped and  draped in the usual sterile fashion. 1% lidocaine was used for local anesthesia. Following this, a 19 gauge, 7-cm, Yueh catheter was introduced. An ultrasound image was saved for documentation purposes. The paracentesis was performed. The catheter was removed and a dressing was applied. The patient tolerated the procedure well without immediate post procedural complication. FINDINGS: A total of approximately 5.2 L of clear yellow fluid was removed. Samples were sent to the laboratory as requested by the clinical team. IMPRESSION: Successful ultrasound-guided paracentesis yielding 5.2 liters of peritoneal fluid. Read by: Soyla Dryer, NP Electronically Signed   By: Sandi Mariscal M.D.   On: 05/04/2020 11:08    Medications: Infusions: . sodium chloride 250 mL (05/05/20 0845)  . cefTRIAXone (ROCEPHIN)  IV 2 g (05/06/20 2703)    Scheduled Medications: . brimonidine  1 drop Left Eye BID  . diclofenac Sodium  2 g Topical BID  . dorzolamide-timolol  1 drop Left Eye BID  . fluticasone  1 spray Each Nare Daily  . folic acid  1 mg Oral Daily  . LORazepam  0-4 mg Intravenous Q6H   Followed by  . LORazepam  0-4 mg Intravenous Q12H  . multivitamin with minerals  1 tablet Oral Daily  . pantoprazole  40 mg Oral Daily  . sodium chloride flush  3 mL Intravenous Q12H  . thiamine  100 mg Oral Daily   Or  . thiamine  100 mg Intravenous Daily    have reviewed scheduled and prn medications.  Physical Exam: General:NAD, comfortable Heart:RRR, s1s2 nl Lungs:clear b/l, no crackle Abdomen:soft, Non-tender, distended abdomen Extremities: Bilateral periankle edema Neurology: Alert awake and following commands   Dron Tanna Furry 05/06/2020,9:03 AM  LOS: 2 days

## 2020-05-07 ENCOUNTER — Inpatient Hospital Stay (HOSPITAL_COMMUNITY): Payer: Medicare Other

## 2020-05-07 DIAGNOSIS — R601 Generalized edema: Secondary | ICD-10-CM | POA: Diagnosis not present

## 2020-05-07 DIAGNOSIS — I10 Essential (primary) hypertension: Secondary | ICD-10-CM | POA: Diagnosis not present

## 2020-05-07 DIAGNOSIS — N179 Acute kidney failure, unspecified: Secondary | ICD-10-CM | POA: Diagnosis not present

## 2020-05-07 DIAGNOSIS — R0600 Dyspnea, unspecified: Secondary | ICD-10-CM | POA: Diagnosis not present

## 2020-05-07 LAB — CULTURE, BODY FLUID W GRAM STAIN -BOTTLE: Special Requests: ADEQUATE

## 2020-05-07 LAB — RENAL FUNCTION PANEL
Albumin: 3 g/dL — ABNORMAL LOW (ref 3.5–5.0)
Anion gap: 12 (ref 5–15)
BUN: 19 mg/dL (ref 8–23)
CO2: 26 mmol/L (ref 22–32)
Calcium: 8.2 mg/dL — ABNORMAL LOW (ref 8.9–10.3)
Chloride: 106 mmol/L (ref 98–111)
Creatinine, Ser: 1.54 mg/dL — ABNORMAL HIGH (ref 0.61–1.24)
GFR, Estimated: 45 mL/min — ABNORMAL LOW (ref >60)
Glucose, Bld: 116 mg/dL — ABNORMAL HIGH (ref 70–99)
Phosphorus: 2.5 mg/dL (ref 2.5–4.6)
Potassium: 3.3 mmol/L — ABNORMAL LOW (ref 3.5–5.1)
Sodium: 144 mmol/L (ref 135–145)

## 2020-05-07 LAB — PATHOLOGIST SMEAR REVIEW

## 2020-05-07 MED ORDER — POTASSIUM CHLORIDE CRYS ER 20 MEQ PO TBCR
40.0000 meq | EXTENDED_RELEASE_TABLET | Freq: Once | ORAL | Status: AC
  Administered 2020-05-07: 40 meq via ORAL
  Filled 2020-05-07: qty 2

## 2020-05-07 MED ORDER — LACTULOSE 10 GM/15ML PO SOLN
20.0000 g | Freq: Two times a day (BID) | ORAL | Status: DC
  Administered 2020-05-07 (×2): 20 g via ORAL
  Filled 2020-05-07 (×2): qty 30

## 2020-05-07 MED ORDER — FUROSEMIDE 40 MG PO TABS
40.0000 mg | ORAL_TABLET | Freq: Every day | ORAL | Status: DC
  Administered 2020-05-07 – 2020-05-11 (×5): 40 mg via ORAL
  Filled 2020-05-07 (×5): qty 1

## 2020-05-07 MED ORDER — SODIUM CHLORIDE 0.9 % IV SOLN
1.0000 g | Freq: Three times a day (TID) | INTRAVENOUS | Status: DC
  Administered 2020-05-07 – 2020-05-08 (×3): 1 g via INTRAVENOUS
  Filled 2020-05-07 (×3): qty 1
  Filled 2020-05-07: qty 0.03
  Filled 2020-05-07: qty 1

## 2020-05-07 NOTE — NC FL2 (Signed)
[name] LEVEL OF CARE SCREENING TOOL     IDENTIFICATION  Patient Name: Randy Wood Birthdate: 1940-06-13 Sex: male Admission Date (Current Location): 05/03/2020  [facility] and [place] Number:  [name] and Address:  [facility], [place] [address], [place], [place] [address]      Provider Number: [id number]  Attending Physician Name and Address:  Cristal Ford, DO  Relative Name and Phone Number:  Michell Heinrich (Daughter)   [phone] [facility])    Current Level of Care: Hospital Recommended Level of Care: [place] Prior Approval Number:    Date Approved/Denied:   PASRR Number: [id number]  Discharge Plan: SNF    Current Diagnoses: Patient Active Problem List   Diagnosis Date Noted  . Anasarca 05/04/2020  . Liver failure ([place]) 05/04/2020  . Acute renal failure (ARF) ([place]) 05/04/2020  . Dyspnea on exertion 05/04/2020  . Macrocytic anemia 05/04/2020  . Transaminitis 05/04/2020  . Hyperbilirubinemia 05/04/2020  . Acne rosacea 03/20/2020  . Adenomatous polyp of colon 03/20/2020  . Body mass index (BMI) 31.0-31.9, adult 03/20/2020  . 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 Erectile dysfunction 03/20/2020  . Esophageal stricture 03/20/2020  . Essential hypertension 03/20/2020  . Essential tremor 03/20/2020  . Gait abnormality 03/20/2020  . Gastro-esophageal reflux disease without esophagitis 03/20/2020  . 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 Hypercholesterolemia 03/20/2020  . Irritable bowel syndrome 03/20/2020  . Male hypogonadism 03/20/2020  . Osteoarthritis of knee 03/20/2020  . Perennial allergic rhinitis 03/20/2020  . Vasomotor rhinitis 03/20/2020    Orientation RESPIRATION BLADDER Height & Weight     Self,Time,Situation,Place  Normal External catheter Weight: 222 lb 1.6 oz (100.7 kg) (scale a) Height:  5\' 11"  (180.3 cm)  BEHAVIORAL SYMPTOMS/MOOD NEUROLOGICAL BOWEL NUTRITION STATUS       Continent Diet (See d/c summary)  AMBULATORY STATUS COMMUNICATION OF NEEDS Skin   Extensive Assist Verbally Normal                       Personal Care Assistance Level of Assistance  Bathing,Feeding,Dressing Bathing Assistance: Limited assistance Feeding assistance: Independent Dressing Assistance: Limited assistance     Functional Limitations Info  Sight,Hearing,Speech Sight Info: Adequate Hearing Info: Adequate Speech Info: Adequate    SPECIAL CARE FACTORS FREQUENCY  PT (By licensed PT),OT (By licensed OT)     PT Frequency: 5x/week OT Frequency: 5x/week            Contractures Contractures Info: Not present    Additional Factors Info  Code Status,Allergies Code Status Info: Full code Allergies Info: Lisinopril           Current Medications (05/07/2020):  This is the current hospital active medication list Current Facility-Administered Medications  Medication Dose Route Frequency Provider Last Rate Last Admin  . 0.9 %  sodium chloride infusion   Intravenous PRN Cristal Ford, DO 10 mL/hr at 05/05/20 0845 250 mL at 05/05/20 0845  . albuterol (PROVENTIL) (2.5 MG/3ML) 0.083% nebulizer solution 2.5 mg  2.5 mg Nebulization Q6H PRN Smith, Rondell A, MD      . brimonidine (ALPHAGAN) 0.2 % ophthalmic solution 1 drop  1 drop Left Eye BID Fuller Plan A, MD   1 drop at 05/07/20 1106  . diclofenac Sodium (VOLTAREN) 1 % topical gel 2 g  2 g Topical BID Tamala Julian, Rondell A, MD   2 g at 05/07/20 1115  . dorzolamide-timolol (COSOPT) 22.3-6.8 MG/ML ophthalmic solution 1 drop  1  drop Left Eye BID Fuller Plan A, MD   1 drop at 05/07/20 1106  . fluticasone (FLONASE) 50 MCG/ACT nasal spray 1 spray  1 spray Each Nare Daily Fuller Plan A, MD   1 spray at 05/07/20 1106  . folic acid (FOLVITE) tablet 1 mg  1 mg Oral Daily Smith, Rondell A, MD   1 mg at 05/07/20 1103  . furosemide (LASIX) tablet 40 mg  40 mg Oral Daily Lakendrick, Paradis, PA-C   40 mg at 05/07/20 1114  .  lactulose (CHRONULAC) 10 GM/15ML solution 20 g  20 g Oral BID Janis, Cuffe, PA-C   20 g at 05/07/20 1104  . LORazepam (ATIVAN) injection 0-4 mg  0-4 mg Intravenous Q12H Smith, Rondell A, MD      . meropenem (MERREM) 1 g in sodium chloride 0.9 % 100 mL IVPB  1 g Intravenous Q8H Mikhail, [place], DO   Stopped at 05/07/20 1318  . multivitamin with minerals tablet 1 tablet  1 tablet Oral Daily Fuller Plan A, MD   1 tablet at 05/07/20 1103  . pantoprazole (PROTONIX) EC tablet 40 mg  40 mg Oral Daily Tamala Julian, Rondell A, MD   40 mg at 05/07/20 1103  . sodium chloride flush (NS) 0.9 % injection 3 mL  3 mL Intravenous Q12H Smith, Rondell A, MD   3 mL at 05/07/20 1105  . thiamine tablet 100 mg  100 mg Oral Daily Tamala Julian, Rondell A, MD   100 mg at 05/07/20 1103   Or  . thiamine (B-1) injection 100 mg  100 mg Intravenous Daily Norval Morton, MD         Discharge Medications: Please see discharge summary for a list of discharge medications.  Relevant Imaging Results:  Relevant Lab Results:   Additional Information SSN [place],

## 2020-05-07 NOTE — Progress Notes (Signed)
Occupational Therapy Treatment Patient Details Name: Randy Wood MRN: [id number] DOB: 03/05/1941 Today's Date: 05/07/2020    History of present illness Patient is a 80 y/o male who presents with SOB, swelling. Admitted with new onset of CHF and anasarca secondary to hepatic failure. CT abd-ascites with infiltration of liver conerning for cirrhosis. s/p paracentesis 2/5. PMH includes macular degeneration, alcohol abuse and HTN.   OT comments  Pt making good progress with OT today. Pt walking in hallway and into bathroom to toilet with supervision. Pt groomed at sink with supervision.  Feel pt would be safe to d/c home with [facility] services.  Pt is known to have a history of alcoholism so do feel supervision may be warranted.  This will affect his balance/safety and he does live alone with daughters that are not close by.  Have talked to daughter about supervision at home until he is back to his baseline.  Will continue to see to focus on adls and functional mobility.   Follow Up Recommendations  Home health OT;Supervision/Assistance - 24 hour;Supervision - Intermittent    Equipment Recommendations  None recommended by OT    Recommendations for Other Services      Precautions / Restrictions Precautions Precautions: Fall;Other (comment) Precaution Comments: pitting edema BLE, ascites Restrictions Weight Bearing Restrictions: No       Mobility Bed Mobility Overal bed mobility: Needs Assistance Bed Mobility: Supine to Sit     Supine to sit: Supervision Sit to supine: Supervision   General bed mobility comments: Pt able to complete supine <> sit with HOB flat, use of bed rails. Increased time/effort  Transfers Overall transfer level: Needs assistance Equipment used: Rolling walker (2 wheeled) Transfers: Sit to/from Stand Sit to Stand: Supervision         General transfer comment: Rising from edge of bed without physical assist    Balance Overall balance assessment: Needs  assistance Sitting-balance support: Feet supported;No upper extremity supported Sitting balance-Leahy Scale: Good     Standing balance support: During functional activity Standing balance-Leahy Scale: Fair Standing balance comment: Pt needs walker for anything dynamic. pt was able to stand at sink to groom without use of sink for short moments without assist.                           ADL either performed or assessed with clinical judgement   ADL Overall ADL's : Needs assistance/impaired Eating/Feeding: Independent;Sitting   Grooming: Wash/dry hands;Wash/dry face;Oral care;Standing;Supervision/safety Grooming Details (indicate cue type and reason): pt tolerated standing at sink longer today.  appx 5 minutes without fatigue.         Upper Body Dressing : Supervision/safety;Sitting   Lower Body Dressing: Min guard;Sit to/from stand;Cueing for compensatory techniques   Toilet Transfer: Supervision/safety;RW;Ambulation;Cueing for safety   Toileting- Clothing Manipulation and Hygiene: Min guard;Sit to/from stand       Functional mobility during ADLs: Min guard;Rolling walker General ADL Comments: Pt doing better with adls. Pt overall at supervision level.  Feel he could d/c home with [facility] but some supervision would be best due to cogntition.  Pt also with history of alcoholism which can affect balance.     Vision   Vision Assessment?: No apparent visual deficits   Perception     Praxis      Cognition Arousal/Alertness: Awake/alert Behavior During Therapy: WFL for tasks assessed/performed Overall Cognitive Status: History of cognitive impairments - at baseline  General Comments: pt continues to have safety issues.  Pt thinks he can get up and go to bathroom without assist and walk in room without assist.  Chair alarm under pt.        Exercises     Shoulder Instructions       General Comments Pt much improved  today    Pertinent Vitals/ Pain       Pain Assessment: No/denies pain  Home Living                                          Prior Functioning/Environment              Frequency  Min 2X/week        Progress Toward Goals  OT Goals(current goals can now be found in the care plan section)  Progress towards OT goals: Progressing toward goals  Acute Rehab OT Goals Patient Stated Goal: to be independent OT Goal Formulation: With patient Time For Goal Achievement: 05/20/20 Potential to Achieve Goals: Good ADL Goals Pt Will Perform Grooming: with modified independence;standing Pt Will Perform Lower Body Bathing: with supervision;sit to/from stand Pt Will Perform Lower Body Dressing: with modified independence;sit to/from stand Pt Will Perform Tub/Shower Transfer: Shower transfer;with supervision;ambulating;rolling walker Additional ADL Goal #1: Pt will walk to bathroom with walker and complete all toileting with mod I with 3:1 over commode. Additional ADL Goal #2: Pt will state two  new things he can do at home to conserve energy and make adls easier without VCs.  Plan Discharge plan needs to be updated    Co-evaluation                 AM-PAC OT "6 Clicks" Daily Activity     Outcome Measure   Help from another person eating meals?: None Help from another person taking care of personal grooming?: None Help from another person toileting, which includes using toliet, bedpan, or urinal?: None Help from another person bathing (including washing, rinsing, drying)?: A Little Help from another person to put on and taking off regular upper body clothing?: None Help from another person to put on and taking off regular lower body clothing?: A Little 6 Click Score: 22    End of Session Equipment Utilized During Treatment: Rolling walker  OT Visit Diagnosis: Unsteadiness on feet (R26.81)   Activity Tolerance Patient tolerated treatment well   Patient  Left in chair;with call bell/phone within reach;with chair alarm set   Nurse Communication Mobility status        Time: 1505-6979 OT Time Calculation (min): 31 min  Charges: OT General Charges $OT Visit: 1 Visit OT Treatments $Self Care/Home Management : 8-22 mins   Glenford Peers 05/07/2020, 12:33 PM

## 2020-05-07 NOTE — TOC Initial Note (Addendum)
Transition of Care [facility]) - Initial/Assessment Note    Patient Details  Name: Randy Wood MRN: [id number] Date of Birth: 10/20/1940  [facility] CM/SW Contact:    Bethann Berkshire, [place] Phone Number: 05/07/2020, 2:14 PM  Clinical Narrative:                  CSW met with pt to discuss [place] vs SNF. CSW explained differences in services and processes for arranging each service. Pt is currently undecided whether he wants [facility] or SNF. He states he needs to talk to his daughter who just came into town from [place]. Pt States he has another daughter who lives in [place] who just left [place] today. Pt explains he would have a decision on Friday. CSW explained that decision would need to made sooner in case of earlier d/c date.   Pt lives home alone in a single story home in [place]. Pt states if he were going to go home with [facility] he would see if his daughters would be able to take turns living with him temporarily. Pt is agreeable to start a SNF workup in the case that he chooses SNF. The only SNF that pt is familiar with is [facility] as his wife was there a few years ago; they had a positive experience there. Fl2 to be completed and bed requests faxed in hub.   CSW also discussed with pt his alcohol use. Pt states he has not drank in 1.5 months. He explains he does not have concerns about his alcohol use and does not need any substance use resources.   Expected Discharge Plan: [place] Barriers to Discharge: Continued Medical Work up   Patient Goals and CMS Choice Patient states their goals for this hospitalization and ongoing recovery are:: Pt either wants HH or SNF; he is currently unsure      Expected Discharge Plan and Services Expected Discharge Plan: [place]       Living arrangements for the past 2 months: Single Family Home                                      Prior Living Arrangements/Services Living arrangements for the  past 2 months: Single Family Home Lives with:: Self          Need for Family Participation in Patient Care: Yes (Comment) Care giver support system in place?: No (comment)      Activities of Daily Living Home Assistive Devices/Equipment: Cane (specify quad or straight),Dentures (specify type),Other (Comment) (partial upper and lower) ADL Screening (condition at time of admission) Patient's cognitive ability adequate to safely complete daily activities?: Yes Is the patient deaf or have difficulty hearing?: No Does the patient have difficulty seeing, even when wearing glasses/contacts?: No Does the patient have difficulty concentrating, remembering, or making decisions?: No Patient able to express need for assistance with ADLs?: Yes Does the patient have difficulty dressing or bathing?: No Independently performs ADLs?: Yes (appropriate for developmental age) Does the patient have difficulty walking or climbing stairs?: Yes Weakness of Legs: Both Weakness of Arms/Hands: None  Permission Sought/Granted                  Emotional Assessment Appearance:: Appears stated age Attitude/Demeanor/Rapport: Engaged Affect (typically observed): Pleasant Orientation: : Oriented to Self,Oriented to Place,Oriented to  Time,Oriented to Situation Alcohol / Substance Use: Not  Applicable Psych Involvement: No (comment)  Admission diagnosis:  Anasarca [R60.1] SOB (shortness of breath) [R06.02] Other ascites [R18.8] Ascites [R18.8] Patient Active Problem List   Diagnosis Date Noted  . Anasarca 05/04/2020  . Liver failure ([place]) 05/04/2020  . Acute renal failure (ARF) ([place]) 05/04/2020  . Dyspnea on exertion 05/04/2020  . Macrocytic anemia 05/04/2020  . Transaminitis 05/04/2020  . Hyperbilirubinemia 05/04/2020  . Acne rosacea 03/20/2020  . Adenomatous polyp of colon 03/20/2020  . Body mass index (BMI) 31.0-31.9, adult 03/20/2020  . 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  Erectile dysfunction 03/20/2020  . Esophageal stricture 03/20/2020  . Essential hypertension 03/20/2020  . Essential tremor 03/20/2020  . Gait abnormality 03/20/2020  . Gastro-esophageal reflux disease without esophagitis 03/20/2020  . 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 Hypercholesterolemia 03/20/2020  . Irritable bowel syndrome 03/20/2020  . Male hypogonadism 03/20/2020  . Osteoarthritis of knee 03/20/2020  . Perennial allergic rhinitis 03/20/2020  . Vasomotor rhinitis 03/20/2020   PCP:  Eunice Blase, MD Pharmacy:   [facility] [address], [place] [facility] [address] [place] [address] Phone: [phone] Fax: [phone]     Social Determinants of Health ([place]) Interventions    Readmission Risk Interventions No flowsheet data found.

## 2020-05-07 NOTE — Progress Notes (Addendum)
CC: Bilateral leg swelling and leg pain  64 YOM with HTN and HLD presented to ED 2/2 complaining of bilateral leg pain and swelling which started around 04/16/20; the patient was discharged home at that time. 2/5 returned to the ED with abdominal ascites, LFTs elevated, and SOB. SCr 2.18 (1.53 on 2/2), CT abdomen and pelvis showed extensive ascites with infiltration of the liver concerning for cirrhosis. Labs indicate hepatorenal syndrome.   2/8: Abdominal peritoneal fluid culture positive for ESBL E. coli. SCr trending down.   Calculated CrCl 54 mL/min (C-G)   Antibiotic change: STOP ceftriaxone; START meropenem 1 g IV Q8H   Authorizing provider: Dr. Cristal Ford, DO   Results for orders placed or performed during the hospital encounter of 05/03/20                          Gram stain     Status: None   Collection Time: 05/04/20 11:14 AM   Specimen: Abdomen; Peritoneal Fluid  Result Value Ref Range Status   Specimen Description ABDOMEN  Final   Special Requests PERITONEAL FLUID  Final   Gram Stain   Final    WBC PRESENT,BOTH PMN AND MONONUCLEAR NO ORGANISMS SEEN CYTOSPIN SMEAR Performed at [facility], [address], [place], [place] [address]    Report Status 05/04/2020 FINAL  Final  Culture, body fluid-bottle     Status: Abnormal   Collection Time: 05/04/20 11:14 AM   Specimen: Abdomen  Result Value Ref Range Status   Specimen Description ABDOMEN PERITONEAL FLUID  Final   Special Requests   Final    BOTTLES DRAWN AEROBIC ONLY Blood Culture adequate volume   Gram Stain   Final    GRAM NEGATIVE RODS AEROBIC BOTTLE ONLY CRITICAL RESULT CALLED TO, READ BACK BY AND VERIFIED WITH: RN MIKE FUTRELL BY MESSAN H. AT 5681 ON 05/05/2020 Performed at [facility], [place] [address], [place], [place] [address]    Culture (A)  Final    ESCHERICHIA COLI Confirmed Extended Spectrum Beta-Lactamase Producer (ESBL).  In bloodstream infections from ESBL organisms, carbapenems  are preferred over piperacillin/tazobactam. They are shown to have a lower risk of mortality.    Report Status 05/07/2020 FINAL  Final   Organism ID, Bacteria ESCHERICHIA COLI  Final      Susceptibility   Escherichia coli - MIC*    AMPICILLIN >=32 RESISTANT Resistant     CEFAZOLIN >=64 RESISTANT Resistant     CEFEPIME 16 RESISTANT Resistant     CEFTAZIDIME RESISTANT Resistant     CEFTRIAXONE >=64 RESISTANT Resistant     CIPROFLOXACIN >=4 RESISTANT Resistant     GENTAMICIN <=1 SENSITIVE Sensitive     IMIPENEM <=0.25 SENSITIVE Sensitive     TRIMETH/SULFA <=20 SENSITIVE Sensitive     AMPICILLIN/SULBACTAM 4 SENSITIVE Sensitive     PIP/TAZO <=4 SENSITIVE Sensitive     * ESCHERICHIA COLI   Adria Dill, PharmD-Candidate  05/07/2020 Antimicrobial Stewardship

## 2020-05-07 NOTE — Progress Notes (Signed)
Randy Wood PROGRESS NOTE  Assessment/ Plan: Pt is a 80 y.o. yo male with HTN, HLD, alcohol abuse admitted with anasarca, dyspnea on exertion and AKI.  # Acute kidney injury:  Due to HRS and acute worsening related with SBP/peritonitis. Urinalysis with UTI/cloudy, minimal protein. Urine sodium <10. CT scan ruled out hydronephrosis. Responded very well with IV Lasix and albumin.  Urine output 2.2 L and creatinine level trending down to 1.5 today.  Clinically improving.  Switch to oral Lasix 40 mg daily.  #Decompensated liver disease:Likely alcoholic liver disease/cirrhosis now with evidence of SBP (atypical clinical presentation). GI is following.  #Hypertension: Continue to hold antihypertensive therapy and restart propranolol if needed for portal hypertension.  # Pedal edema/ascites:Secondary to decompensated liver disease/hypoalbuminemia in the setting of worsening renal function and previous NSAID use. Edema is much better today therefore switching to p.o. diuretics.  If therapeutic paracentesis is ordered, he will need intravenous albumin (50 g of 25%) if >5 L ascitic fluid drawn.  #Hypokalemia: Replacing potassium chloride. Monitor lab.  Discussed with the patient and his daughter.  Renal function significantly improved.  Plan as outlined above.  I will sign off, patient can follow-up at [facility] office. Discussed with the GI team as well. Call back with question.  Subjective: Seen and examined. Urine output around 2.2 L. Reports feeling much better.  Denies nausea vomiting chest pain shortness of breath.  His daughter and GI team at bedside.  Objective Vital signs in last 24 hours: Vitals:   05/06/20 1049 05/06/20 1225 05/06/20 1943 05/07/20 0332  BP: (!) 98/53 121/60 123/65 (!) 125/59  Pulse: 97 89 90 84  Resp:  18 18 18   Temp:   98.7 F (37.1 C) 99 F (37.2 C)  TempSrc:   Oral Oral  SpO2:  100% 97% 96%  Weight:    100.7 kg  Height:        Weight change: -0.862 kg  Intake/Output Summary (Last 24 hours) at 05/07/2020 0925 Last data filed at 05/07/2020 0737 Gross per 24 hour  Intake 453 ml  Output 2200 ml  Net -1747 ml       Labs: Basic Metabolic Panel: Recent Labs  Lab 05/05/20 0411 05/06/20 0215 05/07/20 0338  NA 139 141 144  K 3.4* 3.1* 3.3*  CL 106 106 106  CO2 21* 23 26  GLUCOSE 136* 152* 116*  BUN 28* 23 19  CREATININE 2.48* 2.15* 1.54*  CALCIUM 8.0* 8.0* 8.2*  PHOS 2.3*  --  2.5   Liver Function Tests: Recent Labs  Lab 05/04/20 0753 05/05/20 0411 05/06/20 0215 05/07/20 0338  AST 104* 77* 77*  --   ALT 64* 45* 39  --   ALKPHOS 212* 159* 148*  --   BILITOT 3.7* 2.3* 2.2*  --   PROT 6.3* 5.3* 5.3*  --   ALBUMIN 2.7* 2.7* 2.8* 3.0*   No results for input(s): LIPASE, AMYLASE in the last 168 hours. Recent Labs  Lab 05/05/20 1016  AMMONIA 47*   CBC: Recent Labs  Lab 05/01/20 1348 05/03/20 1152 05/05/20 0411 05/06/20 0215  WBC 8.6 7.3 5.8 5.9  NEUTROABS 5,865  --   --   --   HGB 12.1* 12.2* 10.5* 10.8*  HCT 35.2* 36.7* 31.1* 30.6*  MCV 107.3* 110.2* 109.5* 106.6*  PLT 208 182 131* 131*   Cardiac Enzymes: No results for input(s): CKTOTAL, CKMB, CKMBINDEX, TROPONINI in the last 168 hours. CBG: No results for input(s): GLUCAP in the last  168 hours.  Iron Studies: No results for input(s): IRON, TIBC, TRANSFERRIN, FERRITIN in the last 72 hours. Studies/Results: No results found.  Medications: Infusions: . sodium chloride 250 mL (05/05/20 0845)  . cefTRIAXone (ROCEPHIN)  IV 2 g (05/07/20 0419)    Scheduled Medications: . brimonidine  1 drop Left Eye BID  . diclofenac Sodium  2 g Topical BID  . dorzolamide-timolol  1 drop Left Eye BID  . fluticasone  1 spray Each Nare Daily  . folic acid  1 mg Oral Daily  . furosemide  40 mg Oral Daily  . lactulose  20 g Oral BID  . LORazepam  0-4 mg Intravenous Q12H  . multivitamin with minerals  1 tablet Oral Daily  . pantoprazole  40 mg  Oral Daily  . sodium chloride flush  3 mL Intravenous Q12H  . thiamine  100 mg Oral Daily   Or  . thiamine  100 mg Intravenous Daily    have reviewed scheduled and prn medications.  Physical Exam: General:NAD, comfortable Heart:RRR, s1s2 nl Lungs:clear b/l, no crackle Abdomen:soft, Non-tender, distended abdomen Extremities: Edema improved Neurology: Alert awake and following commands   Dron Tanna Furry 05/07/2020,9:25 AM  LOS: 3 days

## 2020-05-07 NOTE — Progress Notes (Signed)
[facility]ology Progress Note  Randy Wood 80 y.o. 1940-05-31  CC:  Decompensated cirrhosis, suspected SBP  Subjective: Patient reports continued abdominal distention though denies overt pain.  He had dyspnea on exertion today while walking through the halls.  Denies any nausea or vomiting.  Had 1 bowel movement yesterday after initiation of lactulose.  Denies melena or hematochezia.  ROS : Review of Systems  Respiratory: Positive for shortness of breath (with exertion). Negative for cough.   Cardiovascular: Negative for chest pain and palpitations.  Gastrointestinal: Negative for abdominal pain, blood in stool, constipation, diarrhea, heartburn, melena, nausea and vomiting.   Objective: Vital signs in last 24 hours: Vitals:   05/06/20 1943 05/07/20 0332  BP: 123/65 (!) 125/59  Pulse: 90 84  Resp: 18 18  Temp: 98.7 F (37.1 C) 99 F (37.2 C)  SpO2: 97% 96%    Physical Exam:  General:  Alert, oriented, cooperative, no distress  Head:  Normocephalic, without obvious abnormality, atraumatic  Eyes:  Mild scleral icterus, EOMs intact  Lungs:   Clear to auscultation bilaterally, respirations unlabored  Heart:  Regular rate and rhythm, S1, S2 normal  Abdomen:   Moderately distended but non-tender with bowel sounds active all four quadrants, no guarding or peritoneal signs  Extremities: Mild bilateral lower extremity edema  Neuro: Alert and oriented    Lab Results: Recent Labs    05/05/20 0411 05/06/20 0215 05/07/20 0338  NA 139 141 144  K 3.4* 3.1* 3.3*  CL 106 106 106  CO2 21* 23 26  GLUCOSE 136* 152* 116*  BUN 28* 23 19  CREATININE 2.48* 2.15* 1.54*  CALCIUM 8.0* 8.0* 8.2*  MG 1.6*  --   --   PHOS 2.3*  --  2.5   Recent Labs    05/05/20 0411 05/06/20 0215 05/07/20 0338  AST 77* 77*  --   ALT 45* 39  --   ALKPHOS 159* 148*  --   BILITOT 2.3* 2.2*  --   PROT 5.3* 5.3*  --   ALBUMIN 2.7* 2.8* 3.0*   Recent Labs    05/05/20 0411 05/06/20 0215   WBC 5.8 5.9  HGB 10.5* 10.8*  HCT 31.1* 30.6*  MCV 109.5* 106.6*  PLT 131* 131*   Recent Labs    05/05/20 0411  LABPROT 15.8*  INR 1.3*      Assessment: Decompensated cirrhosis, suspected SBP -T. Bili 2.2/ AST 77/ ALT 39/ ALP 148 as of 2/7 -No leukocytosis (WBCs 5.9) as of 2/7 -E. Coli on culture of ascitic fluid 2/5  AKI, improving: BUN 19/ Cr 1.54  Mild normocytic anemia: Hgb 10.8 as of 2/7, stable. No signs of GI bleeding.  Plan: Lasix 40 mg PO daily ordered per Dr. Carolin Sicks (nephrology).  He recommends holding off on spironolactone initiation at this time and considering initiation as an outpatient once renal function remains stable.  Continue empiric antibiotics.  Repeat paracentesis in ~2 days to reassess cell count/culture/gram stain.  However, if dyspnea worsens, we will proceed sooner.  If paracentesis is performed, albumin should be given.  Patient will need prophylactic antibiotics at discharge indefinitely, given SBP.  Increase lactulose to 20g BID.  Titrate for 2-3 soft BMs per day.   [facility] will follow.  Salley Slaughter PA-C 05/07/2020, 9:14 AM  Contact #  [phone]

## 2020-05-07 NOTE — Progress Notes (Addendum)
PROGRESS NOTE    Randy Wood  UXN:[id number] DOB: [birth year] DOA: 05/03/2020 PCP: Eunice Blase, MD   Brief Narrative:  HPI on 05/04/2020 by Dr. Fuller Plan Randy Wood is a 80 y.o. male with medical history significant of hypertension, hyperlipidemia, alcohol abuse presents with complaints of shortness of breath and loss of strength in his leg.  Since the middle of September 2021 he reports that he has had progressively worsening swelling of his legs that now goes up into his thighs.  He has had shortness of breath on exertion.  At this time even trying to get dressed he is out of breath.  He has been seen by his primary care provider and had recently done labs which gave concern for the possibility of DVT.  Patient had a Doppler ultrasound of his lower extremities performed yesterday that was negative.  He is unsure of how much weight he has put on, but states that it is hard for him to pick up his legs he feels so heavy and his stomach has been getting more distended.  With the leg swelling it has also caused significant knee and joint pain.  He had been taking ibuprofen 800 mg twice daily up until the middle of December 2021.  Question if he had a urinary tract infection because his urine had changed to a more dark color.  Patient had not been tried on any diuretics.  Previously, when on hydrochlorothiazide and losartan some years ago for blood pressure had had several falls.  He does admit that he drinks 3 drinks of vodka and tonic water daily.  Usually each drink is made up of 1 ounce of vodka to 7 ounces of tonic water.  Denies having any fevers, cough, dysuria, confusion, or chest pain symptoms.   Interim history Patient admitted with decompensated liver disease, with significant ascites along with renal failure.  GI and nephrology consulted and appreciated.  Assessment & Plan   Decompensated liver disease with anasarca/SBP -Patient complained of progressively worsening abdominal and  lower extremity swelling -CT imaging showed extensive ascites and concern for cirrhosis -Meld score 23 on admission -Suspect liver failure secondary to alcohol although patient tells me his last use was approximately 1 month ago -Ultrasound-guided paracentesis yielded 5.2 L of clear yellow fluid -?Hepatorenal syndrome given azotemia -Gastroenterology and nephrology consulted and appreciated-recommending continuing Lasix and adding spironolactone if okay with nephrology.  Recommended avoiding nonselective beta-blocker given SBP.  Continue lactulose to have 2-3 bowel movements per day. -Nephrology consulted and appreciated, patient was initially on IV Lasix along with albumin.  Has done well, switching to oral Lasix today. -Peritoneal fluid Gram stain shows gram-negative rods-E. coli, ESBL -Initially placed on ceftriaxone however given ESBL, transition to Merrem (Bactrim may be a possibility for discharge) -Given abdominal distention, have ordered repeat abdominal ultrasound to assess for ascites -placed on lactulose  Acute kidney injury -Creatinine on admission was 2.8, down to 1.54 today.   -Creatinine 2020 was 0.84, however 2 days prior to admission, creatinine was 1.53.  Unknown true baseline. -Patient had been using ibuprofen although states he is not used it since December -As above ?  Hepatorenal syndrome -Treatment and plan as above -Nephrology consulted and appreciated -Avoid nephrotoxic agents -Continue to monitor BMP  Dyspnea on exertion -Patient currently on room air -BNP was elevated 237.7 -Chest x-ray unremarkable for infection or edema -Suspect secondary to the above -Patient does feel his breathing has improved today although has not been out of bed. -  Continue incentive spirometry  Lower extremity weakness -Patient states he has been unable to get up and walk around due to weakness in his legs.  Suspect this is secondary to significant lower extremity edema -PT and OT  consulted and recommended home health  Essential hypertension -Home medications of amlodipine and propranolol were held due to paracentesis and possible fluid shift -Will continue to monitor blood pressure and restart medications as needed  Macrocytic anemia -Hemoglobin 10.8 today, continue to monitor CBC -Vitamin B12 1696, Folate 29.7  Osteoarthritis, acute on chronic -Patient complains of joint pains -Continue Voltaren gel as needed  Transaminitis with hyperbilirubinemia -Likely secondary to the above -Hepatitis panel nonreactive  Alcohol abuse -Patient states he has not had any alcohol over the last month although reported to the manner that he was drinking 3 drinks of vodka and tonic water a day -Continue CIWA protocol with Ativan -Discussed cessation  GERD -Continue PPI  Hypokalemia -K 3.3, continue to replace and monitor   DVT Prophylaxis  SCDs  Code Status: Full  Family Communication: None at bedside. Daughter at bedside.  Disposition Plan:  Status is: Inpatient  Remains inpatient appropriate because:Inpatient level of care appropriate due to severity of illness   Dispo: The patient is from: Home              Anticipated d/c is to: Home with home health              Anticipated d/c date is: 2 days              Patient currently is not medically stable to d/c.   Difficult to place patient No   Consultants Nephrology Gastroenterology  Procedures  Ultrasound-guided paracentesis  Antibiotics   Anti-infectives (From admission, onward)   Start     Dose/Rate Route Frequency Ordered Stop   05/07/20 1100  meropenem (MERREM) 1 g in sodium chloride 0.9 % 100 mL IVPB        1 g 200 mL/hr over 30 Minutes Intravenous Every 8 hours 05/07/20 1008     05/05/20 0500  cefTRIAXone (ROCEPHIN) 2 g in sodium chloride 0.9 % 100 mL IVPB  Status:  Discontinued        2 g 200 mL/hr over 30 Minutes Intravenous Every 24 hours 05/05/20 0413 05/07/20 1008      Subjective:    Randy Wood seen and examined today.  Patient states he is feeling better but continues to have abdominal distention. Feels his legs are improving. Denies current chest pain, N/V, dizziness, headache.   Objective:   Vitals:   05/06/20 1943 05/07/20 0332 05/07/20 0935 05/07/20 1129  BP: 123/65 (!) 125/59 120/63 126/63  Pulse: 90 84 86 84  Resp: 18 18  18   Temp: 98.7 F (37.1 C) 99 F (37.2 C)  98.3 F (36.8 C)  TempSrc: Oral Oral  Oral  SpO2: 97% 96%  97%  Weight:  100.7 kg    Height:        Intake/Output Summary (Last 24 hours) at 05/07/2020 1332 Last data filed at 05/07/2020 5170 Gross per 24 hour  Intake 253 ml  Output 2200 ml  Net -1947 ml   Filed Weights   05/05/20 0200 05/06/20 0200 05/07/20 0332  Weight: 103.8 kg 101.6 kg 100.7 kg   Exam  General: Well developed, chronically ill appearing, NAD  HEENT: NCAT, mucous membranes moist.   Cardiovascular: S1 S2 auscultated, RRR, SEM  Respiratory: Diminished breath sounds, but clear   Abdomen: Firm,  nontender, distended, + bowel sounds  Extremities: warm dry without cyanosis clubbing. LE edema, approving.   Neuro: AAOx3, nonfocal  Psych: pleasant, appropriate mood and affect   Data Reviewed: I have personally reviewed following labs and imaging studies  CBC: Recent Labs  Lab 05/01/20 1348 05/03/20 1152 05/05/20 0411 05/06/20 0215  WBC 8.6 7.3 5.8 5.9  NEUTROABS 5,865  --   --   --   HGB 12.1* 12.2* 10.5* 10.8*  HCT 35.2* 36.7* 31.1* 30.6*  MCV 107.3* 110.2* 109.5* 106.6*  PLT 208 182 131* 119*   Basic Metabolic Panel: Recent Labs  Lab 05/01/20 1348 05/03/20 1152 05/05/20 0411 05/06/20 0215 05/07/20 0338  NA 139 137 139 141 144  K 4.2 3.9 3.4* 3.1* 3.3*  CL 100 101 106 106 106  CO2 20 19* 21* 23 26  GLUCOSE 98 151* 136* 152* 116*  BUN 21 26* 28* 23 19  CREATININE 1.53* 2.80* 2.48* 2.15* 1.54*  CALCIUM 8.8 8.5* 8.0* 8.0* 8.2*  MG  --   --  1.6*  --   --   PHOS  --   --  2.3*  --  2.5    GFR: Estimated Creatinine Clearance: 46.3 mL/min (A) (by C-G formula based on SCr of 1.54 mg/dL (H)). Liver Function Tests: Recent Labs  Lab 05/01/20 1348 05/04/20 0753 05/05/20 0411 05/06/20 0215 05/07/20 0338  AST 145* 104* 77* 77*  --   ALT 78* 64* 45* 39  --   ALKPHOS  --  212* 159* 148*  --   BILITOT 3.2* 3.7* 2.3* 2.2*  --   PROT 5.8* 6.3* 5.3* 5.3*  --   ALBUMIN  --  2.7* 2.7* 2.8* 3.0*   No results for input(s): LIPASE, AMYLASE in the last 168 hours. Recent Labs  Lab 05/05/20 1016  AMMONIA 47*   Coagulation Profile: Recent Labs  Lab 05/04/20 0902 05/05/20 0411  INR 1.3* 1.3*   Cardiac Enzymes: No results for input(s): CKTOTAL, CKMB, CKMBINDEX, TROPONINI in the last 168 hours. BNP (last 3 results) No results for input(s): PROBNP in the last 8760 hours. HbA1C: No results for input(s): HGBA1C in the last 72 hours. CBG: No results for input(s): GLUCAP in the last 168 hours. Lipid Profile: No results for input(s): CHOL, HDL, LDLCALC, TRIG, CHOLHDL, LDLDIRECT in the last 72 hours. Thyroid Function Tests: No results for input(s): TSH, T4TOTAL, FREET4, T3FREE, THYROIDAB in the last 72 hours. Anemia Panel: Recent Labs    05/05/20 1016  VITAMINB12 1,696*  FOLATE 29.7   Urine analysis:    Component Value Date/Time   COLORURINE AMBER (A) 05/04/2020 1555   APPEARANCEUR CLOUDY (A) 05/04/2020 1555   LABSPEC 1.020 05/04/2020 1555   PHURINE 5.0 05/04/2020 1555   GLUCOSEU NEGATIVE 05/04/2020 1555   HGBUR NEGATIVE 05/04/2020 1555   BILIRUBINUR SMALL (A) 05/04/2020 1555   KETONESUR 5 (A) 05/04/2020 1555   PROTEINUR 30 (A) 05/04/2020 1555   NITRITE NEGATIVE 05/04/2020 1555   LEUKOCYTESUR MODERATE (A) 05/04/2020 1555   Sepsis Labs: @LABRCNTIP (procalcitonin:4,lacticidven:4)  ) Recent Results (from the past 240 hour(s))  SARS Coronavirus 2 by RT PCR (hospital order, performed in [facility] [place] hospital lab) Nasopharyngeal Nasopharyngeal Swab     Status: None    Collection Time: 05/04/20  7:53 AM   Specimen: Nasopharyngeal Swab  Result Value Ref Range Status   SARS Coronavirus 2 NEGATIVE NEGATIVE Final    Comment: (NOTE) SARS-CoV-2 target nucleic acids are NOT DETECTED.  The SARS-CoV-2 RNA is generally detectable in  upper and lower respiratory specimens during the acute phase of infection. The lowest concentration of SARS-CoV-2 viral copies this assay can detect is 250 copies / mL. A negative result does not preclude SARS-CoV-2 infection and should not be used as the sole basis for treatment or other patient management decisions.  A negative result may occur with improper specimen collection / handling, submission of specimen other than nasopharyngeal swab, presence of viral mutation(s) within the areas targeted by this assay, and inadequate number of viral copies (<250 copies / mL). A negative result must be combined with clinical observations, patient history, and epidemiological information.  Fact Sheet for Patients:   StrictlyIdeas.no  Fact Sheet for Healthcare Providers: BankingDealers.co.za  This test is not yet approved or  cleared by the [place] FDA and has been authorized for detection and/or diagnosis of SARS-CoV-2 by FDA under an Emergency Use Authorization (EUA).  This EUA will remain in effect (meaning this test can be used) for the duration of the COVID-19 declaration under Section 564(b)(1) of the Act, 21 U.S.C. section 360bbb-3(b)(1), unless the authorization is terminated or revoked sooner.  Performed at [facility], [place] [address], [place], [place] [address]   Gram stain     Status: None   Collection Time: 05/04/20 11:14 AM   Specimen: Abdomen; Peritoneal Fluid  Result Value Ref Range Status   Specimen Description ABDOMEN  Final   Special Requests PERITONEAL FLUID  Final   Gram Stain   Final    WBC PRESENT,BOTH PMN AND MONONUCLEAR NO ORGANISMS  SEEN CYTOSPIN SMEAR Performed at [facility], [address] [place], [place] [address]    Report Status 05/04/2020 FINAL  Final  Culture, body fluid-bottle     Status: Abnormal   Collection Time: 05/04/20 11:14 AM   Specimen: Abdomen  Result Value Ref Range Status   Specimen Description ABDOMEN PERITONEAL FLUID  Final   Special Requests   Final    BOTTLES DRAWN AEROBIC ONLY Blood Culture adequate volume   Gram Stain   Final    GRAM NEGATIVE RODS AEROBIC BOTTLE ONLY CRITICAL RESULT CALLED TO, READ BACK BY AND VERIFIED WITH: RN MIKE FUTRELL BY MESSAN H. AT 2951 ON 05/05/2020 Performed at [facility], [place] [address], [place], [place] [address]    Culture (A)  Final    ESCHERICHIA COLI Confirmed Extended Spectrum Beta-Lactamase Producer (ESBL).  In bloodstream infections from ESBL organisms, carbapenems are preferred over piperacillin/tazobactam. They are shown to have a lower risk of mortality.    Report Status 05/07/2020 FINAL  Final   Organism ID, Bacteria ESCHERICHIA COLI  Final      Susceptibility   Escherichia coli - MIC*    AMPICILLIN >=32 RESISTANT Resistant     CEFAZOLIN >=64 RESISTANT Resistant     CEFEPIME 16 RESISTANT Resistant     CEFTAZIDIME RESISTANT Resistant     CEFTRIAXONE >=64 RESISTANT Resistant     CIPROFLOXACIN >=4 RESISTANT Resistant     GENTAMICIN <=1 SENSITIVE Sensitive     IMIPENEM <=0.25 SENSITIVE Sensitive     TRIMETH/SULFA <=20 SENSITIVE Sensitive     AMPICILLIN/SULBACTAM 4 SENSITIVE Sensitive     PIP/TAZO <=4 SENSITIVE Sensitive     * ESCHERICHIA COLI      Radiology Studies: No results found.   Scheduled Meds: . brimonidine  1 drop Left Eye BID  . diclofenac Sodium  2 g Topical BID  . dorzolamide-timolol  1 drop Left Eye BID  .  fluticasone  1 spray Each Nare Daily  . folic acid  1 mg Oral Daily  . furosemide  40 mg Oral Daily  . lactulose  20 g Oral BID  . LORazepam  0-4 mg Intravenous Q12H  . multivitamin with minerals  1  tablet Oral Daily  . pantoprazole  40 mg Oral Daily  . sodium chloride flush  3 mL Intravenous Q12H  . thiamine  100 mg Oral Daily   Or  . thiamine  100 mg Intravenous Daily   Continuous Infusions: . sodium chloride 250 mL (05/05/20 0845)  . meropenem (MERREM) IV 1 g (05/07/20 1243)     LOS: 3 days   Time Spent in minutes   45 minutes  Alexes Menchaca D.O. on 05/07/2020 at 1:32 PM  Between 7am to 7pm - Please see pager noted on [email or web address]  After 7pm go to [email or web address]  And look for the night coverage person covering for me after hours  [facility] Office  [phone]

## 2020-05-07 NOTE — Progress Notes (Signed)
Inpatient Rehabilitation Admissions Coordinator  Noted with therapy today patient min guard assist 200 feet with RW. Patient has progressed nicely with therapy and not in need of an inpatient rehab admit at this level. I have alerted acute team and TOC.  Danne Baxter, RN, MSN Rehab Admissions Coordinator [phone] 05/07/2020 9:34 AM

## 2020-05-07 NOTE — Progress Notes (Signed)
Physical Therapy Treatment Patient Details Name: Randy Wood MRN: [id number] DOB: 1940-12-28 Today's Date: 05/07/2020    History of Present Illness Patient is a 80 y/o male who presents with SOB, swelling. Admitted with new onset of CHF and anasarca secondary to hepatic failure. CT abd-ascites with infiltration of liver conerning for cirrhosis. s/p paracentesis 2/5. PMH includes macular degeneration, alcohol abuse and HTN.    PT Comments    Pt making excellent progress towards his physical therapy goals, requiring less assist overall for mobility and demonstrating improved activity tolerance and strength. Pt performing bed mobility and transfers at a supervision level, ambulating x 200 feet with a walker at a min guard assist level. Education provided regarding fall prevention techniques, home set up, proper foot wear, recommended supervision level at home.   Extensive discussion regarding differences between short term rehab and [facility] services. Pt/pt daughter would like to discuss with other family members before making a decision regarding dispo. CSW notified.   Follow Up Recommendations  Home health PT v SNF (awaiting family decision; deciding how much supervision/assist they can provide)     Equipment Recommendations  Rolling walker with 5" wheels    Recommendations for Other Services       Precautions / Restrictions Precautions Precautions: Fall;Other (comment) Precaution Comments: pitting edema BLE, ascites Restrictions Weight Bearing Restrictions: No    Mobility  Bed Mobility Overal bed mobility: Needs Assistance Bed Mobility: Supine to Sit;Sit to Supine     Supine to sit: Supervision Sit to supine: Supervision   General bed mobility comments: Pt able to complete supine <> sit with HOB flat, use of bed rails. Increased time/effort  Transfers Overall transfer level: Needs assistance Equipment used: Rolling walker (2 wheeled) Transfers: Sit to/from Stand Sit to  Stand: Supervision         General transfer comment: Rising from edge of bed without physical assist  Ambulation/Gait Ambulation/Gait assistance: Min guard Gait Distance (Feet): 200 Feet Assistive device: Rolling walker (2 wheeled) Gait Pattern/deviations: Step-through pattern;Decreased stride length;Decreased dorsiflexion - right;Decreased dorsiflexion - left Gait velocity: decreased   General Gait Details: Cues for segmental turning, walker proximity, min guard for safety   Stairs             Wheelchair Mobility    Modified Rankin (Stroke Patients Only)       Balance Overall balance assessment: Needs assistance Sitting-balance support: Feet supported;No upper extremity supported Sitting balance-Leahy Scale: Good     Standing balance support: During functional activity Standing balance-Leahy Scale: Poor Standing balance comment: reliant on external support                            Cognition Arousal/Alertness: Awake/alert Behavior During Therapy: WFL for tasks assessed/performed Overall Cognitive Status: Within Functional Limits for tasks assessed                                        Exercises      General Comments        Pertinent Vitals/Pain Pain Assessment: No/denies pain    Home Living                      Prior Function            PT Goals (current goals can now be found in the care plan section) Acute  Rehab PT Goals Patient Stated Goal: to be independent Potential to Achieve Goals: Good Progress towards PT goals: Progressing toward goals    Frequency    Min 3X/week      PT Plan Discharge plan needs to be updated    Co-evaluation              AM-PAC PT "6 Clicks" Mobility   Outcome Measure  Help needed turning from your back to your side while in a flat bed without using bedrails?: None Help needed moving from lying on your back to sitting on the side of a flat bed without using  bedrails?: None Help needed moving to and from a bed to a chair (including a wheelchair)?: None Help needed standing up from a chair using your arms (e.g., wheelchair or bedside chair)?: None Help needed to walk in hospital room?: A Little Help needed climbing 3-5 steps with a railing? : A Lot 6 Click Score: 21    End of Session Equipment Utilized During Treatment: Gait belt Activity Tolerance: Patient tolerated treatment well Patient left: in bed;with call bell/phone within reach;with bed alarm set Nurse Communication: Mobility status PT Visit Diagnosis: Muscle weakness (generalized) (M62.81);Unsteadiness on feet (R26.81)     Time: 1282-0813 PT Time Calculation (min) (ACUTE ONLY): 42 min  Charges:  $Gait Training: 8-22 mins $Therapeutic Activity: 8-22 mins $Self Care/Home Management: 8-22                     Randy Wood, PT, DPT Acute Rehabilitation Services Pager [phone] Office [phone]    Randy Wood 05/07/2020, 9:06 AM

## 2020-05-08 ENCOUNTER — Ambulatory Visit: Payer: BLUE CROSS/BLUE SHIELD | Admitting: Cardiology

## 2020-05-08 DIAGNOSIS — K652 Spontaneous bacterial peritonitis: Secondary | ICD-10-CM | POA: Diagnosis not present

## 2020-05-08 DIAGNOSIS — Z1612 Extended spectrum beta lactamase (ESBL) resistance: Secondary | ICD-10-CM | POA: Diagnosis not present

## 2020-05-08 DIAGNOSIS — R197 Diarrhea, unspecified: Secondary | ICD-10-CM

## 2020-05-08 DIAGNOSIS — R000 Tachycardia, unspecified: Secondary | ICD-10-CM | POA: Diagnosis not present

## 2020-05-08 DIAGNOSIS — R600 Localized edema: Secondary | ICD-10-CM

## 2020-05-08 DIAGNOSIS — A498 Other bacterial infections of unspecified site: Secondary | ICD-10-CM

## 2020-05-08 DIAGNOSIS — B9620 Unspecified Escherichia coli [E. coli] as the cause of diseases classified elsewhere: Secondary | ICD-10-CM | POA: Diagnosis not present

## 2020-05-08 DIAGNOSIS — K7460 Unspecified cirrhosis of liver: Secondary | ICD-10-CM

## 2020-05-08 DIAGNOSIS — K769 Liver disease, unspecified: Secondary | ICD-10-CM

## 2020-05-08 LAB — COMPREHENSIVE METABOLIC PANEL
ALT: 30 U/L (ref 0–44)
AST: 68 U/L — ABNORMAL HIGH (ref 15–41)
Albumin: 2.6 g/dL — ABNORMAL LOW (ref 3.5–5.0)
Alkaline Phosphatase: 110 U/L (ref 38–126)
Anion gap: 10 (ref 5–15)
BUN: 16 mg/dL (ref 8–23)
CO2: 27 mmol/L (ref 22–32)
Calcium: 7.9 mg/dL — ABNORMAL LOW (ref 8.9–10.3)
Chloride: 107 mmol/L (ref 98–111)
Creatinine, Ser: 1.36 mg/dL — ABNORMAL HIGH (ref 0.61–1.24)
GFR, Estimated: 53 mL/min — ABNORMAL LOW (ref >60)
Glucose, Bld: 108 mg/dL — ABNORMAL HIGH (ref 70–99)
Potassium: 3.1 mmol/L — ABNORMAL LOW (ref 3.5–5.1)
Sodium: 144 mmol/L (ref 135–145)
Total Bilirubin: 1.8 mg/dL — ABNORMAL HIGH (ref 0.3–1.2)
Total Protein: 4.9 g/dL — ABNORMAL LOW (ref 6.5–8.1)

## 2020-05-08 MED ORDER — POTASSIUM CHLORIDE CRYS ER 20 MEQ PO TBCR
40.0000 meq | EXTENDED_RELEASE_TABLET | Freq: Two times a day (BID) | ORAL | Status: AC
  Administered 2020-05-08 (×2): 40 meq via ORAL
  Filled 2020-05-08 (×2): qty 2

## 2020-05-08 MED ORDER — SODIUM CHLORIDE 0.9 % IV SOLN
1.0000 g | INTRAVENOUS | Status: DC
  Administered 2020-05-08: 1000 mg via INTRAVENOUS
  Filled 2020-05-08: qty 1

## 2020-05-08 MED ORDER — SODIUM CHLORIDE 0.9 % IV SOLN
1.0000 g | INTRAVENOUS | Status: DC
  Administered 2020-05-08 – 2020-05-10 (×3): 1000 mg via INTRAVENOUS
  Filled 2020-05-08 (×4): qty 1

## 2020-05-08 MED ORDER — LACTULOSE 10 GM/15ML PO SOLN
20.0000 g | Freq: Every day | ORAL | Status: DC
  Administered 2020-05-09: 20 g via ORAL
  Filled 2020-05-08: qty 30

## 2020-05-08 NOTE — Consult Note (Signed)
[name] for Infectious Disease    Date of Admission:  05/03/2020     Total days of antibiotics 4               Reason for Consult: Spontaneous Bacterial Peritonitis  Referring Provider: [place] Primary Care Provider: Eunice Blase, MD   ASSESSMENT:  Mr. Randy Wood is an 80 y/o male with decompensated cirrhosis complicated by possible spontaneous bacterial peritonitis with peritoneal fluid cultures positive ESBL E. Coli. Currently on Day 2 of therapy with meropenem. Organism is sensitive to ertapenem so will change for ease of dosing. Will need total therapy of 5 days and may change to Bactrim at discharge if occurs prior to receiving 5 days of medication.  At the current time it does not appear that he meets criteria for ongoing prophylaxis. There is concern of renal function while on Bactrim and possibility of additional resistance developing. Recommend prophylaxis if he has another episode of SBP.  Continue cirrhosis care per primary team and Gastroenterology.   PLAN:  1. Change meropenem to ertapenem. 2. Continue IV while in the hospital and change to Bactrim at discharge for total treatment course of 5 days.  3. Cirrhosis management per Gastroenterology.   Principal Problem:   Anasarca Active Problems:   Essential hypertension   Gastro-esophageal reflux disease without esophagitis   Osteoarthritis of knee   Liver failure (HCC)   Acute renal failure (ARF) (HCC)   Dyspnea on exertion   Macrocytic anemia   Transaminitis   Hyperbilirubinemia   . brimonidine  1 drop Left Eye BID  . diclofenac Sodium  2 g Topical BID  . dorzolamide-timolol  1 drop Left Eye BID  . fluticasone  1 spray Each Nare Daily  . folic acid  1 mg Oral Daily  . furosemide  40 mg Oral Daily  . lactulose  20 g Oral BID  . LORazepam  0-4 mg Intravenous Q12H  . multivitamin with minerals  1 tablet Oral Daily  . pantoprazole  40 mg Oral Daily  . potassium chloride  40 mEq Oral BID  . sodium  chloride flush  3 mL Intravenous Q12H  . thiamine  100 mg Oral Daily   Or  . thiamine  100 mg Intravenous Daily     HPI: Randy Wood is a 80 y.o. male with previous medical history significant for hypertension and alcohol abuse admitted with shortness breath and loss of leg strength that has been progressively worsening since September. Was seen by primary care with concern for DVT or possible heart failure.   [name] was afebrile on admission with lab work significant for creatinine of 2.8, BNP, 237.7, AST 77, ALT 39, Albumen 2.3, and Bilirubin 2.2. Chest x-ray with no cardiopulmonary disease. CT abdomen/pelvis with extensive abdominal ascites and diffuse fatty infiltration of liver suggesting cirrhosis. Paracentesis performed yeilding 5.2 L of clear yellow fluid. Fluid evaluation with 30% lymphocytes, 3% neutrophils, 49 nucleated cells, and protein level of <3.0. Peritoneal cultures positive for ESBL E. Coli with sensitivity to Imipenem and Bactrim.  [name] has been afebrile since admission. No current abdominal pain. Has had diarrhea overnight and did not have a great night. Has distention of his stomach and mild wheezing. Denies fevers/chills.    Review of Systems: Review of Systems  Constitutional: Negative for chills, fever and weight loss.  Respiratory: Negative for cough, shortness of breath and wheezing.   Cardiovascular: Negative for chest pain and leg swelling.  Gastrointestinal: Positive for  diarrhea. Negative for abdominal pain, constipation, nausea and vomiting.  Skin: Negative for rash.     Past Medical History:  Diagnosis Date  . Arthritis   . Hyperlipemia   . Hypertension   . Macular degeneration, wet ([place])     Social History   Tobacco Use  . Smoking status: Former Smoker    Quit date: 1974    Years since quitting: 48.1  . Smokeless tobacco: Never Used  Vaping Use  . Vaping Use: Never used  Substance Use Topics  . Alcohol use: Yes     Alcohol/week: 3.0 standard drinks    Types: 3 Glasses of wine per week    Comment: 2-3 drinks/night  . Drug use: Never    Family History  Problem Relation Age of Onset  . Colon cancer Mother 56  . Ovarian cancer Mother   . Prostate cancer Father   . Healthy Brother   . Breast cancer Daughter        x1 oldest daughter  . Hashimoto's thyroiditis Daughter        youngest daughter    Allergies  Allergen Reactions  . Lisinopril Other (See Comments)    OBJECTIVE: Blood pressure 125/63, pulse 85, temperature 98.3 F (36.8 C), temperature source Oral, resp. rate (!) 21, height 5\' 11"  (1.803 m), weight 100.7 kg, SpO2 99 %.  Physical Exam Constitutional:      General: He is not in acute distress.    Appearance: He is well-developed and well-nourished.     Comments: Seated in the chair beside the bed; pleasant.   Cardiovascular:     Rate and Rhythm: Regular rhythm. Tachycardia present.     Pulses: Intact distal pulses.     Heart sounds: Normal heart sounds.  Pulmonary:     Effort: Pulmonary effort is normal.     Breath sounds: Wheezing present.  Abdominal:     General: Bowel sounds are normal. There is distension.     Tenderness: There is no abdominal tenderness. There is no rebound.  Musculoskeletal:     Right lower leg: Edema present.     Left lower leg: Edema present.  Skin:    General: Skin is warm and dry.  Neurological:     Mental Status: He is alert and oriented to person, place, and time.  Psychiatric:        Mood and Affect: Mood and affect normal.        Behavior: Behavior normal.        Thought Content: Thought content normal.        Judgment: Judgment normal.     Lab Results Lab Results  Component Value Date   WBC 5.9 05/06/2020   HGB 10.8 (L) 05/06/2020   HCT 30.6 (L) 05/06/2020   MCV 106.6 (H) 05/06/2020   PLT 131 (L) 05/06/2020    Lab Results  Component Value Date   CREATININE 1.36 (H) 05/08/2020   BUN 16 05/08/2020   NA 144 05/08/2020   K  3.1 (L) 05/08/2020   CL 107 05/08/2020   CO2 27 05/08/2020    Lab Results  Component Value Date   ALT 30 05/08/2020   AST 68 (H) 05/08/2020   ALKPHOS 110 05/08/2020   BILITOT 1.8 (H) 05/08/2020     Microbiology: Recent Results (from the past 240 hour(s))  SARS Coronavirus 2 by RT PCR (hospital order, performed in [place] hospital lab) Nasopharyngeal Nasopharyngeal Swab     Status: None   Collection Time: 05/04/20  7:53 AM   Specimen: Nasopharyngeal Swab  Result Value Ref Range Status   SARS Coronavirus 2 NEGATIVE NEGATIVE Final    Comment: (NOTE) SARS-CoV-2 target nucleic acids are NOT DETECTED.  The SARS-CoV-2 RNA is generally detectable in upper and lower respiratory specimens during the acute phase of infection. The lowest concentration of SARS-CoV-2 viral copies this assay can detect is 250 copies / mL. A negative result does not preclude SARS-CoV-2 infection and should not be used as the sole basis for treatment or other patient management decisions.  A negative result may occur with improper specimen collection / handling, submission of specimen other than nasopharyngeal swab, presence of viral mutation(s) within the areas targeted by this assay, and inadequate number of viral copies (<250 copies / mL). A negative result must be combined with clinical observations, patient history, and epidemiological information.  Fact Sheet for Patients:   StrictlyIdeas.no  Fact Sheet for Healthcare Providers: BankingDealers.co.za  This test is not yet approved or  cleared by the [place] FDA and has been authorized for detection and/or diagnosis of SARS-CoV-2 by FDA under an Emergency Use Authorization (EUA).  This EUA will remain in effect (meaning this test can be used) for the duration of the COVID-19 declaration under Section 564(b)(1) of the Act, 21 U.S.C. section 360bbb-3(b)(1), unless the authorization is terminated  or revoked sooner.  Performed at [facility], [place] [address] [place], [place] [address]   Gram stain     Status: None   Collection Time: 05/04/20 11:14 AM   Specimen: Abdomen; Peritoneal Fluid  Result Value Ref Range Status   Specimen Description ABDOMEN  Final   Special Requests PERITONEAL FLUID  Final   Gram Stain   Final    WBC PRESENT,BOTH PMN AND MONONUCLEAR NO ORGANISMS SEEN CYTOSPIN SMEAR Performed at [facility], [address] [place], [place] [address]    Report Status 05/04/2020 FINAL  Final  Culture, body fluid-bottle     Status: Abnormal   Collection Time: 05/04/20 11:14 AM   Specimen: Abdomen  Result Value Ref Range Status   Specimen Description ABDOMEN PERITONEAL FLUID  Final   Special Requests   Final    BOTTLES DRAWN AEROBIC ONLY Blood Culture adequate volume   Gram Stain   Final    GRAM NEGATIVE RODS AEROBIC BOTTLE ONLY CRITICAL RESULT CALLED TO, READ BACK BY AND VERIFIED WITH: RN MIKE FUTRELL BY MESSAN H. AT 8527 ON 05/05/2020 Performed at [facility], [place] [address], [place], [place] [address]    Culture (A)  Final    ESCHERICHIA COLI Confirmed Extended Spectrum Beta-Lactamase Producer (ESBL).  In bloodstream infections from ESBL organisms, carbapenems are preferred over piperacillin/tazobactam. They are shown to have a lower risk of mortality.    Report Status 05/07/2020 FINAL  Final   Organism ID, Bacteria ESCHERICHIA COLI  Final      Susceptibility   Escherichia coli - MIC*    AMPICILLIN >=32 RESISTANT Resistant     CEFAZOLIN >=64 RESISTANT Resistant     CEFEPIME 16 RESISTANT Resistant     CEFTAZIDIME RESISTANT Resistant     CEFTRIAXONE >=64 RESISTANT Resistant     CIPROFLOXACIN >=4 RESISTANT Resistant     GENTAMICIN <=1 SENSITIVE Sensitive     IMIPENEM <=0.25 SENSITIVE Sensitive     TRIMETH/SULFA <=20 SENSITIVE Sensitive     AMPICILLIN/SULBACTAM 4 SENSITIVE Sensitive     PIP/TAZO <=4 SENSITIVE Sensitive     * ESCHERICHIA  COLI  Culture, blood (routine x 2)     Status: None (Preliminary result)   Collection Time: 05/07/20  2:23 PM   Specimen: BLOOD  Result Value Ref Range Status   Specimen Description BLOOD RIGHT ANTECUBITAL  Final   Special Requests   Final    BOTTLES DRAWN AEROBIC AND ANAEROBIC Blood Culture adequate volume   Culture   Final    NO GROWTH < 24 HOURS Performed at [facility], [place] [address], [place], [place] [address]    Report Status PENDING  Incomplete  Culture, blood (routine x 2)     Status: None (Preliminary result)   Collection Time: 05/07/20  2:23 PM   Specimen: BLOOD LEFT HAND  Result Value Ref Range Status   Specimen Description BLOOD LEFT HAND  Final   Special Requests   Final    BOTTLES DRAWN AEROBIC AND ANAEROBIC Blood Culture adequate volume   Culture   Final    NO GROWTH < 24 HOURS Performed at [facility], [place] [address], [place], [place] [address]    Report Status PENDING  Incomplete     Terri Piedra, [place] [facility]  05/08/2020  9:14 AM

## 2020-05-08 NOTE — TOC Progression Note (Signed)
Transition of Care [facility]) - Progression Note    Patient Details  Name: Randy Wood MRN: [id number] Date of Birth: September 23, 1940  Transition of [facility]) CM/SW [place], [place] Phone Number: 05/08/2020, 12:13 PM  Clinical Narrative:     CSW met with pt and pt daughter, Randy Wood, bedside to discuss discposition. Pt only has 1 bed offer with [facility] but [facility] is unable to confirm whether or not they will have a bed in the next 1-2 days. Pt and daughter want to hear back from [facility] prior to deciding between SNF and Home with homehealth. If pt returns home he would need a walker and HH. Pt's daughter Randy Wood and son in law would live with pt temporarily for supervision.   Jani from [facility] stated she would be able to confirm bed status this afternoon.    Expected Discharge Plan: [place] Barriers to Discharge: Continued Medical Work up  Expected Discharge Plan and Services Expected Discharge Plan: [place] arrangements for the past 2 months: Single Family Home                                       Social Determinants of Health (SDOH) Interventions    Readmission Risk Interventions No flowsheet data found.

## 2020-05-08 NOTE — Progress Notes (Signed)
Physical Therapy Treatment Patient Details Name: Randy Wood MRN: [id number] DOB: 1940-06-22 Today's Date: 05/08/2020    History of Present Illness Patient is a 80 y/o male who presents with SOB, swelling. Admitted with new onset of CHF and anasarca secondary to hepatic failure. CT abd-ascites with infiltration of liver conerning for cirrhosis. s/p paracentesis 2/5. PMH includes macular degeneration, alcohol abuse and HTN.    PT Comments    Pt/pt family still have not decided on dispo preference and choices reiterated. Pt daughter, Randy Wood states they do plan to build a ramp at pt home. Pt continues to maintain functional mobility. Ambulating x 180 ft with a walker at a min guard assist level. Reports improving BLE strength and decreased edema. Will continue to progress as tolerated.    Follow Up Recommendations  Home health PT;SNF  (awaiting family decision; deciding how much supervision/assist they can provide)     Equipment Recommendations  Rolling walker with 5" wheels    Recommendations for Other Services       Precautions / Restrictions Precautions Precautions: Fall Restrictions Weight Bearing Restrictions: No    Mobility  Bed Mobility Overal bed mobility: Needs Assistance Bed Mobility: Supine to Sit     Supine to sit: Supervision     General bed mobility comments: HOB elevated, no physical assist required to progress to edge of bed  Transfers Overall transfer level: Needs assistance Equipment used: Rolling walker (2 wheeled) Transfers: Sit to/from Stand Sit to Stand: Supervision         General transfer comment: Supervision to rise from low bed and toilet  Ambulation/Gait Ambulation/Gait assistance: Min guard Gait Distance (Feet): 180 Feet Assistive device: Rolling walker (2 wheeled) Gait Pattern/deviations: Step-through pattern;Decreased stride length;Decreased dorsiflexion - right;Decreased dorsiflexion - left Gait velocity: decreased   General Gait  Details: Cues for activity pacing, min guard for safety   Stairs             Wheelchair Mobility    Modified Rankin (Stroke Patients Only)       Balance Overall balance assessment: Needs assistance Sitting-balance support: Feet supported;No upper extremity supported Sitting balance-Leahy Scale: Good     Standing balance support: During functional activity Standing balance-Leahy Scale: Poor Standing balance comment: reliant on external support                            Cognition Arousal/Alertness: Awake/alert Behavior During Therapy: WFL for tasks assessed/performed Overall Cognitive Status: Within Functional Limits for tasks assessed                                 General Comments: WFL for basic mobility      Exercises General Exercises - Lower Extremity Long Arc Quad: Both;10 reps;Seated Hip Flexion/Marching: Both;5 reps;Seated Heel Raises: Both;10 reps;Seated    General Comments        Pertinent Vitals/Pain Pain Assessment: No/denies pain    Home Living                      Prior Function            PT Goals (current goals can now be found in the care plan section) Acute Rehab PT Goals Patient Stated Goal: to be independent Potential to Achieve Goals: Good Progress towards PT goals: Progressing toward goals    Frequency    Min 3X/week  PT Plan Current plan remains appropriate    Co-evaluation              AM-PAC PT "6 Clicks" Mobility   Outcome Measure  Help needed turning from your back to your side while in a flat bed without using bedrails?: None Help needed moving from lying on your back to sitting on the side of a flat bed without using bedrails?: None Help needed moving to and from a bed to a chair (including a wheelchair)?: None Help needed standing up from a chair using your arms (e.g., wheelchair or bedside chair)?: None Help needed to walk in hospital room?: A Little Help  needed climbing 3-5 steps with a railing? : A Lot 6 Click Score: 21    End of Session Equipment Utilized During Treatment: Gait belt Activity Tolerance: Patient tolerated treatment well Patient left: with call bell/phone within reach;in chair;with chair alarm set;with family/visitor present Nurse Communication: Mobility status PT Visit Diagnosis: Muscle weakness (generalized) (M62.81);Unsteadiness on feet (R26.81)     Time: 7579-7282 PT Time Calculation (min) (ACUTE ONLY): 40 min  Charges:  $Gait Training: 8-22 mins $Therapeutic Activity: 23-37 mins                     Randy Wood, PT, DPT Acute Rehabilitation Services Pager [phone] Office [phone]    Randy Wood 05/08/2020, 9:17 AM

## 2020-05-08 NOTE — TOC Progression Note (Addendum)
Transition of Care (TOC) - Progression Note    Patient Details  Name: Randy Wood MRN: [id number] Date of Birth: 07/03/1940  Transition of Care (TOC) CM/SW Contact  Cyrus P Kolar, LCSW Phone Number: 05/08/2020, 4:03 PM  Clinical Narrative:     CSW spoke with Jani at [facility]. She can offer a bed for tomorrow but the pt would have to make decision today or else facility won't hold a bed.   CSW met with pt and pt daughter bedside. CSW explained likely discharge tomorrow and the need to choose between HH and [facility] SNF. Pt chooses home with HH at this time. He will need PTAR transport; he needs assistance into the home. Pt also needs rolling walker. Medicare choice list given for HH. Daughter and pt to decide and notify CSW to arrange HH PT/OT.  Rolling Walker has been arranged with [facility].   Expected Discharge Plan: Home w Home Health Services Barriers to Discharge: Continued Medical Work up  Expected Discharge Plan and Services Expected Discharge Plan: Home w Home Health Services       Living arrangements for the past 2 months: Single Family Home                                       Social Determinants of Health (SDOH) Interventions    Readmission Risk Interventions No flowsheet data found.  

## 2020-05-08 NOTE — Progress Notes (Signed)
[facility] Gastroenterology Progress Note  Randy Wood 80 y.o. 07-05-1940  CC:  Decompensated cirrhosis, suspected SBP  Subjective: Patient reports two diarrheal episodes overnight.  Denies melena or hematochezia.  Denies abdominal pain, nausea, or vomiting.  ROS : Review of Systems  Cardiovascular: Negative for chest pain and palpitations.  Gastrointestinal: Positive for diarrhea. Negative for abdominal pain, blood in stool, constipation, heartburn, melena, nausea and vomiting.   Objective: Vital signs in last 24 hours: Vitals:   05/07/20 2059 05/08/20 0331  BP: 117/64 125/63  Pulse: 81 85  Resp: 20 (!) 21  Temp: 98.7 F (37.1 C) 98.3 F (36.8 C)  SpO2: 97% 99%    Physical Exam:  General:  Alert, oriented, cooperative, no distress  Head:  Normocephalic, without obvious abnormality, atraumatic  Eyes:  Anicteric sclera, EOMs intact  Lungs:   Breathing comfortably on room air, respirations unlabored  Heart:  Regular rate and rhythm, S1, S2 normal  Abdomen:   Mildly distended but non-tender with bowel sounds active all four quadrants, no guarding or peritoneal signs  Extremities: Trace lower extremity edema  Neuro: Alert and oriented, no asterixis     Lab Results: Recent Labs    05/07/20 0338 05/08/20 0302  NA 144 144  K 3.3* 3.1*  CL 106 107  CO2 26 27  GLUCOSE 116* 108*  BUN 19 16  CREATININE 1.54* 1.36*  CALCIUM 8.2* 7.9*  PHOS 2.5  --    Recent Labs    05/06/20 0215 05/07/20 0338 05/08/20 0302  AST 77*  --  68*  ALT 39  --  30  ALKPHOS 148*  --  110  BILITOT 2.2*  --  1.8*  PROT 5.3*  --  4.9*  ALBUMIN 2.8* 3.0* 2.6*   Recent Labs    05/06/20 0215  WBC 5.9  HGB 10.8*  HCT 30.6*  MCV 106.6*  PLT 131*   No results for input(s): LABPROT, INR in the last 72 hours.    Assessment: Decompensated cirrhosis, suspected SBP; repeat US yesterday 2/8 revealed mild ascites throughout the abdomen, decreased from the prior study. -T. Bili 1.8/ AST 68/ ALT  30/ ALP 110  AKI, improving: BUN 16/ Cr 1.36  Mild normocytic anemia: Hgb 10.8 as of 2/7, stable. No signs of GI bleeding.  Plan: Given only mild ascites on [place] yesterday, hold off on repeat paracentesis at this time.  Continue IV meropenem for a total of 7 days as per ID.  Patient will need prophylactic antibiotics at discharge indefinitely, given SBP (trimethoprim-sulfamethoxazole-one double-strength tablet daily)  Decrease lactulose to 20g daily.  Titrate for 2-3 soft BMs per day.   [facility] will follow.  Salley Slaughter PA-C 05/08/2020, 9:48 AM  Contact #  [phone]

## 2020-05-08 NOTE — Progress Notes (Signed)
PROGRESS NOTE    Randy Wood  CXK:[id number] DOB: 04/03/1940 DOA: 05/03/2020 PCP: Eunice Blase, MD   Brief Narrative: 80 year old with past medical history significant for hypertension, hyperlipidemia, alcohol abuse presented with complaint of shortness of breath and loss of strength in his leg.  He report progressively worsening lower extremity edema since September 2021.  He reports shortness of breath on exertion.  Lower extremity DVT was ruled out with Doppler.  He report weight gain, lower extremity edema.  He admits drinking alcohol.  Was admitted with decompensated liver disease, significant ascites along with renal failure.  GI and nephrology consulted.  Patient renal function has improved.  He has been tolerating Lasix.  His ascites fluid grew ESBL E. Coli.     Assessment & Plan:   Principal Problem:   Infection due to ESBL-producing Escherichia coli Active Problems:   Essential hypertension   Gastro-esophageal reflux disease without esophagitis   Osteoarthritis of knee   Anasarca   Liver failure (HCC)   Acute renal failure (ARF) (HCC)   Dyspnea on exertion   Macrocytic anemia   Transaminitis   Hyperbilirubinemia   Cirrhosis (HCC)   SBP (spontaneous bacterial peritonitis) ([place])  1-Decompensated liver disease with anasarca/SBP: -Patient complaining of progressively worsening abdominal and lower extremity swelling. -CT imaging showed extensive ascites and concern for cirrhosis.  Meld score 23 on admission. -Suspect liver failure secondary to alcohol use. -Patient underwent paracentesis yielding 5.2 L of clear fluid. -Felt to have hepatorenal syndrome. -Received IV Lasix and albumin during admission. -Continue with oral Lasix.  He will need a spironolactone if renal function remains stable -Peritoneal fluid grew E. coli ESBL.-On meropenem 12/8. -Repeated ultrasound without significant ascites.  2-AKI: Creatinine on admission 2.8. -Related to ibuprofen use   versus hepatorenal syndrome. -improved.  -Cr down to 1.3   3-Dyspnea  on exertion: Chest x ray negative for infection or edema.   SBP, E coli ESBL;  On Invanz  Patient will need Bactrim DS one tablet daily after completion of treatment for SBP.  Per ID continue with IV invanz while in the hospital then transition to Bactrim at discharge. Needs 5 days treatment.   HTN;  On propanolol and amlodipine at home.  Microcytic anemia: Hemoglobin H70 2637, folic acid 29  Osteoarthritis, acute on chronic: Voltaren gel needed  Transaminitis with hyperbilirubinemia: Likely secondary to decompensated liver disease  Alcohol abuse: On CIWA protocol with Ativan He is determined to stop drinking alcohol  GERD: Continue with PPI  Hypokalemia; replete orally.  Diarrhea; hold lactulose today.        Estimated body mass index is 30.98 kg/m as calculated from the following:   Height as of this encounter: 5\' 11"  (1.803 m).   Weight as of this encounter: 100.7 kg.   DVT prophylaxis: SCD Code Status: Full code Family Communication: care discussed with daughter who was at bedside.  Disposition Plan:  Status is: Inpatient  Remains inpatient appropriate because:IV treatments appropriate due to intensity of illness or inability to take PO   Dispo: The patient is from: Home              Anticipated d/c is to: home vs SNF              Anticipated d/c date is: 2 days              Patient currently is not medically stable to d/c.   Difficult to place patient No  Consultants:   GI  ID  Procedures:     Antimicrobials:    Subjective: He report two large watery stool. One last night and one this am.  He is breathing better.    Objective: Vitals:   05/07/20 1907 05/07/20 2059 05/08/20 0319 05/08/20 0331  BP: (!) 112/54 117/64  125/63  Pulse: 93 81  85  Resp:  20  (!) 21  Temp:  98.7 F (37.1 C)  98.3 F (36.8 C)  TempSrc:  Oral  Oral  SpO2: 100% 97%  99%   Weight:   100.7 kg   Height:        Intake/Output Summary (Last 24 hours) at 05/08/2020 1412 Last data filed at 05/08/2020 0142 Gross per 24 hour  Intake 363 ml  Output 1000 ml  Net -637 ml   Filed Weights   05/06/20 0200 05/07/20 0332 05/08/20 0319  Weight: 101.6 kg 100.7 kg 100.7 kg    Examination:  General exam: Appears calm and comfortable  Respiratory system: Clear to auscultation. Respiratory effort normal. Cardiovascular system: S1 & S2 heard, RRR. No JVD, murmurs, rubs, gallops or clicks. No pedal edema. Gastrointestinal system: Abdomen is nondistended, soft and nontender. No organomegaly or masses felt. Normal bowel sounds heard. Central nervous system: Alert and oriented.  Extremities: Symmetric 5 x 5 power. Trace edema    Data Reviewed: I have personally reviewed following labs and imaging studies  CBC: Recent Labs  Lab 05/03/20 1152 05/05/20 0411 05/06/20 0215  WBC 7.3 5.8 5.9  HGB 12.2* 10.5* 10.8*  HCT 36.7* 31.1* 30.6*  MCV 110.2* 109.5* 106.6*  PLT 182 131* 240*   Basic Metabolic Panel: Recent Labs  Lab 05/03/20 1152 05/05/20 0411 05/06/20 0215 05/07/20 0338 05/08/20 0302  NA 137 139 141 144 144  K 3.9 3.4* 3.1* 3.3* 3.1*  CL 101 106 106 106 107  CO2 19* 21* 23 26 27   GLUCOSE 151* 136* 152* 116* 108*  BUN 26* 28* 23 19 16   CREATININE 2.80* 2.48* 2.15* 1.54* 1.36*  CALCIUM 8.5* 8.0* 8.0* 8.2* 7.9*  MG  --  1.6*  --   --   --   PHOS  --  2.3*  --  2.5  --    GFR: Estimated Creatinine Clearance: 52.4 mL/min (A) (by C-G formula based on SCr of 1.36 mg/dL (H)). Liver Function Tests: Recent Labs  Lab 05/04/20 0753 05/05/20 0411 05/06/20 0215 05/07/20 0338 05/08/20 0302  AST 104* 77* 77*  --  68*  ALT 64* 45* 39  --  30  ALKPHOS 212* 159* 148*  --  110  BILITOT 3.7* 2.3* 2.2*  --  1.8*  PROT 6.3* 5.3* 5.3*  --  4.9*  ALBUMIN 2.7* 2.7* 2.8* 3.0* 2.6*   No results for input(s): LIPASE, AMYLASE in the last 168 hours. Recent Labs  Lab  05/05/20 1016  AMMONIA 47*   Coagulation Profile: Recent Labs  Lab 05/04/20 0902 05/05/20 0411  INR 1.3* 1.3*   Cardiac Enzymes: No results for input(s): CKTOTAL, CKMB, CKMBINDEX, TROPONINI in the last 168 hours. BNP (last 3 results) No results for input(s): PROBNP in the last 8760 hours. HbA1C: No results for input(s): HGBA1C in the last 72 hours. CBG: No results for input(s): GLUCAP in the last 168 hours. Lipid Profile: No results for input(s): CHOL, HDL, LDLCALC, TRIG, CHOLHDL, LDLDIRECT in the last 72 hours. Thyroid Function Tests: No results for input(s): TSH, T4TOTAL, FREET4, T3FREE, THYROIDAB in the last 72 hours. Anemia Panel:  No results for input(s): VITAMINB12, FOLATE, FERRITIN, TIBC, IRON, RETICCTPCT in the last 72 hours. Sepsis Labs: No results for input(s): PROCALCITON, LATICACIDVEN in the last 168 hours.  Recent Results (from the past 240 hour(s))  SARS Coronavirus 2 by RT PCR (hospital order, performed in [facility] hospital lab) Nasopharyngeal Nasopharyngeal Swab     Status: None   Collection Time: 05/04/20  7:53 AM   Specimen: Nasopharyngeal Swab  Result Value Ref Range Status   SARS Coronavirus 2 NEGATIVE NEGATIVE Final    Comment: (NOTE) SARS-CoV-2 target nucleic acids are NOT DETECTED.  The SARS-CoV-2 RNA is generally detectable in upper and lower respiratory specimens during the acute phase of infection. The lowest concentration of SARS-CoV-2 viral copies this assay can detect is 250 copies / mL. A negative result does not preclude SARS-CoV-2 infection and should not be used as the sole basis for treatment or other patient management decisions.  A negative result may occur with improper specimen collection / handling, submission of specimen other than nasopharyngeal swab, presence of viral mutation(s) within the areas targeted by this assay, and inadequate number of viral copies (<250 copies / mL). A negative result must be combined with  clinical observations, patient history, and epidemiological information.  Fact Sheet for Patients:   StrictlyIdeas.no  Fact Sheet for Healthcare Providers: BankingDealers.co.za  This test is not yet approved or  cleared by the [place] FDA and has been authorized for detection and/or diagnosis of SARS-CoV-2 by FDA under an Emergency Use Authorization (EUA).  This EUA will remain in effect (meaning this test can be used) for the duration of the COVID-19 declaration under Section 564(b)(1) of the Act, 21 U.S.C. section 360bbb-3(b)(1), unless the authorization is terminated or revoked sooner.  Performed at [facility], [place] [address], [place], [place] [address]   Gram stain     Status: None   Collection Time: 05/04/20 11:14 AM   Specimen: Abdomen; Peritoneal Fluid  Result Value Ref Range Status   Specimen Description ABDOMEN  Final   Special Requests PERITONEAL FLUID  Final   Gram Stain   Final    WBC PRESENT,BOTH PMN AND MONONUCLEAR NO ORGANISMS SEEN CYTOSPIN SMEAR Performed at [facility], [address], [place], [place] [address]    Report Status 05/04/2020 FINAL  Final  Culture, body fluid-bottle     Status: Abnormal   Collection Time: 05/04/20 11:14 AM   Specimen: Abdomen  Result Value Ref Range Status   Specimen Description ABDOMEN PERITONEAL FLUID  Final   Special Requests   Final    BOTTLES DRAWN AEROBIC ONLY Blood Culture adequate volume   Gram Stain   Final    GRAM NEGATIVE RODS AEROBIC BOTTLE ONLY CRITICAL RESULT CALLED TO, READ BACK BY AND VERIFIED WITH: RN MIKE FUTRELL BY MESSAN H. AT 1749 ON 05/05/2020 Performed at [facility], [place] [address] [place], [place] [address]    Culture (A)  Final    ESCHERICHIA COLI Confirmed Extended Spectrum Beta-Lactamase Producer (ESBL).  In bloodstream infections from ESBL organisms, carbapenems are preferred over piperacillin/tazobactam. They are shown  to have a lower risk of mortality.    Report Status 05/07/2020 FINAL  Final   Organism ID, Bacteria ESCHERICHIA COLI  Final      Susceptibility   Escherichia coli - MIC*    AMPICILLIN >=32 RESISTANT Resistant     CEFAZOLIN >=64 RESISTANT Resistant     CEFEPIME 16 RESISTANT Resistant  CEFTAZIDIME RESISTANT Resistant     CEFTRIAXONE >=64 RESISTANT Resistant     CIPROFLOXACIN >=4 RESISTANT Resistant     GENTAMICIN <=1 SENSITIVE Sensitive     IMIPENEM <=0.25 SENSITIVE Sensitive     TRIMETH/SULFA <=20 SENSITIVE Sensitive     AMPICILLIN/SULBACTAM 4 SENSITIVE Sensitive     PIP/TAZO <=4 SENSITIVE Sensitive     * ESCHERICHIA COLI  Culture, blood (routine x 2)     Status: None (Preliminary result)   Collection Time: 05/07/20  2:23 PM   Specimen: BLOOD  Result Value Ref Range Status   Specimen Description BLOOD RIGHT ANTECUBITAL  Final   Special Requests   Final    BOTTLES DRAWN AEROBIC AND ANAEROBIC Blood Culture adequate volume   Culture   Final    NO GROWTH < 24 HOURS Performed at [facility], [place] [address] [place], [place] [address]    Report Status PENDING  Incomplete  Culture, blood (routine x 2)     Status: None (Preliminary result)   Collection Time: 05/07/20  2:23 PM   Specimen: BLOOD LEFT HAND  Result Value Ref Range Status   Specimen Description BLOOD LEFT HAND  Final   Special Requests   Final    BOTTLES DRAWN AEROBIC AND ANAEROBIC Blood Culture adequate volume   Culture   Final    NO GROWTH < 24 HOURS Performed at [facility], [place] [address], [place], [place] [address]    Report Status PENDING  Incomplete         Radiology Studies: US Abdomen Limited  Result Date: 05/07/2020 CLINICAL DATA:  History of recent paracentesis EXAM: LIMITED ABDOMEN ULTRASOUND FOR ASCITES TECHNIQUE: Limited ultrasound survey for ascites was performed in all four abdominal quadrants. COMPARISON:  05/04/2020 FINDINGS: Mild ascites is noted in all 4 quadrants but decreased  when compared with the prior exam consistent with the recent paracentesis. IMPRESSION: Mild ascites throughout the abdomen. Decreased from the prior study. Electronically Signed   By: Inez Catalina M.D.   On: 05/07/2020 13:47        Scheduled Meds: . brimonidine  1 drop Left Eye BID  . diclofenac Sodium  2 g Topical BID  . dorzolamide-timolol  1 drop Left Eye BID  . fluticasone  1 spray Each Nare Daily  . folic acid  1 mg Oral Daily  . furosemide  40 mg Oral Daily  . [START ON 05/09/2020] lactulose  20 g Oral Daily  . multivitamin with minerals  1 tablet Oral Daily  . pantoprazole  40 mg Oral Daily  . potassium chloride  40 mEq Oral BID  . sodium chloride flush  3 mL Intravenous Q12H  . thiamine  100 mg Oral Daily   Or  . thiamine  100 mg Intravenous Daily   Continuous Infusions: . sodium chloride 250 mL (05/05/20 0845)  . ertapenem       LOS: 4 days    Time spent: 35 minutes.     Elmarie Shiley, MD [facility]   If 7PM-7AM, please contact night-coverage [email or web address]  05/08/2020, 2:12 PM

## 2020-05-09 ENCOUNTER — Inpatient Hospital Stay (HOSPITAL_COMMUNITY): Payer: Medicare Other

## 2020-05-09 DIAGNOSIS — A498 Other bacterial infections of unspecified site: Secondary | ICD-10-CM | POA: Diagnosis not present

## 2020-05-09 DIAGNOSIS — Z1612 Extended spectrum beta lactamase (ESBL) resistance: Secondary | ICD-10-CM | POA: Diagnosis not present

## 2020-05-09 HISTORY — PX: IR PARACENTESIS: IMG2679

## 2020-05-09 LAB — CBC
HCT: 32.5 % — ABNORMAL LOW (ref 39.0–52.0)
Hemoglobin: 10.9 g/dL — ABNORMAL LOW (ref 13.0–17.0)
MCH: 36.8 pg — ABNORMAL HIGH (ref 26.0–34.0)
MCHC: 33.5 g/dL (ref 30.0–36.0)
MCV: 109.8 fL — ABNORMAL HIGH (ref 80.0–100.0)
Platelets: DECREASED 10*3/uL (ref 150–400)
RBC: 2.96 MIL/uL — ABNORMAL LOW (ref 4.22–5.81)
RDW: 15.5 % (ref 11.5–15.5)
WBC: 8.2 10*3/uL (ref 4.0–10.5)
nRBC: 0 % (ref 0.0–0.2)

## 2020-05-09 LAB — GRAM STAIN

## 2020-05-09 LAB — BASIC METABOLIC PANEL
Anion gap: 11 (ref 5–15)
BUN: 17 mg/dL (ref 8–23)
CO2: 26 mmol/L (ref 22–32)
Calcium: 7.7 mg/dL — ABNORMAL LOW (ref 8.9–10.3)
Chloride: 103 mmol/L (ref 98–111)
Creatinine, Ser: 1.34 mg/dL — ABNORMAL HIGH (ref 0.61–1.24)
GFR, Estimated: 54 mL/min — ABNORMAL LOW (ref >60)
Glucose, Bld: 115 mg/dL — ABNORMAL HIGH (ref 70–99)
Potassium: 3.2 mmol/L — ABNORMAL LOW (ref 3.5–5.1)
Sodium: 140 mmol/L (ref 135–145)

## 2020-05-09 LAB — BODY FLUID CELL COUNT WITH DIFFERENTIAL
Eos, Fluid: 0 %
Lymphs, Fluid: 37 %
Monocyte-Macrophage-Serous Fluid: 44 % — ABNORMAL LOW (ref 50–90)
Neutrophil Count, Fluid: 19 % (ref 0–25)
Total Nucleated Cell Count, Fluid: 99 cu mm (ref 0–1000)

## 2020-05-09 LAB — PROTEIN, PLEURAL OR PERITONEAL FLUID: Total protein, fluid: <3 g/dL

## 2020-05-09 LAB — ALBUMIN, PLEURAL OR PERITONEAL FLUID: Albumin, Fluid: 1.2 g/dL

## 2020-05-09 MED ORDER — LIDOCAINE HCL (PF) 1 % IJ SOLN
INTRAMUSCULAR | Status: DC | PRN
  Administered 2020-05-09: 10 mL

## 2020-05-09 MED ORDER — LIDOCAINE HCL 1 % IJ SOLN
INTRAMUSCULAR | Status: AC
  Filled 2020-05-09: qty 20

## 2020-05-09 MED ORDER — LACTULOSE 10 GM/15ML PO SOLN: 10.0000 g | Freq: Every day | ORAL | Status: DC | PRN

## 2020-05-09 MED ORDER — POTASSIUM CHLORIDE CRYS ER 20 MEQ PO TBCR
40.0000 meq | EXTENDED_RELEASE_TABLET | Freq: Once | ORAL | Status: AC
  Administered 2020-05-09: 40 meq via ORAL
  Filled 2020-05-09: qty 2

## 2020-05-09 NOTE — Progress Notes (Signed)
Occupational Therapy Treatment Patient Details Name: Randy Wood MRN: [id number] DOB: [birth year] Today's Date: 05/09/2020    History of present illness Patient is a 80 y/o male who presents with SOB, swelling. Admitted with new onset of CHF and anasarca secondary to hepatic failure. CT abd-ascites with infiltration of liver conerning for cirrhosis. s/p paracentesis 2/5. PMH includes macular degeneration, alcohol abuse and HTN.   OT comments  Patient met lying supine in bed in agreement with OT treatment session. Daughter present at bedside. Patient reports just returning to supine after sitting in recliner for several hours and completing 3/3 parts of toileting task with supervision A from daughter and use of RW. Now that patient has decided to return home, treatment session with focus on safe d/c home including home set-up, safety with ADL transfers, and safety with RW in home. Patient and daughter expressed verbal understanding. Daughter notes that patient's home needs some modifications to allow for use of RW in hallways. She reports having purchased a removable ramp, grab bars for the commode, and a walker basket. Daughter to provide pictures of patient's home at time of next OT treatment session. OT will continue to follow acutely.    Follow Up Recommendations  Home health OT;Supervision/Assistance - 24 hour;Supervision - Intermittent    Equipment Recommendations  None recommended by OT (Patient has necessary DME.)    Recommendations for Other Services      Precautions / Restrictions Precautions Precautions: Fall Restrictions Weight Bearing Restrictions: No       Mobility Bed Mobility Overal bed mobility: Needs Assistance             General bed mobility comments: Deferred. Patient had just returned to supine prior to OT entry.  Transfers Overall transfer level: Needs assistance Equipment used: Rolling walker (2 wheeled) Transfers: Sit to/from Stand Sit to Stand:  Supervision         General transfer comment: Patient reports ambulating to commode in bathroom with supervision A from daughter present at bedside prior to OT entry.    Balance                                           ADL either performed or assessed with clinical judgement   ADL                                         General ADL Comments: Patient reports completing 3/3 toileting tasks with supervision A prior to OT entry.     Vision       Perception     Praxis      Cognition Arousal/Alertness: Awake/alert Behavior During Therapy: WFL for tasks assessed/performed Overall Cognitive Status: Within Functional Limits for tasks assessed                                 General Comments: WFL for tasks assessed.        Exercises     Shoulder Instructions       General Comments      Pertinent Vitals/ Pain       Pain Assessment: No/denies pain  Home Living  Prior Functioning/Environment              Frequency  Min 2X/week        Progress Toward Goals  OT Goals(current goals can now be found in the care plan section)  Progress towards OT goals: Progressing toward goals  Acute Rehab OT Goals Patient Stated Goal: to be independent OT Goal Formulation: With patient Time For Goal Achievement: 05/20/20 Potential to Achieve Goals: Good ADL Goals Pt Will Perform Grooming: with modified independence;standing Pt Will Perform Lower Body Bathing: with supervision;sit to/from stand Pt Will Perform Lower Body Dressing: with modified independence;sit to/from stand Pt Will Perform Tub/Shower Transfer: Shower transfer;with supervision;ambulating;rolling walker Additional ADL Goal #1: Pt will walk to bathroom with walker and complete all toileting with mod I with 3:1 over commode. Additional ADL Goal #2: Pt will state two  new things he can do at home to  conserve energy and make adls easier without VCs.  Plan Discharge plan remains appropriate;Frequency remains appropriate    Co-evaluation                 AM-PAC OT "6 Clicks" Daily Activity     Outcome Measure   Help from another person eating meals?: None Help from another person taking care of personal grooming?: None Help from another person toileting, which includes using toliet, bedpan, or urinal?: None Help from another person bathing (including washing, rinsing, drying)?: A Little Help from another person to put on and taking off regular upper body clothing?: None Help from another person to put on and taking off regular lower body clothing?: A Little 6 Click Score: 22    End of Session    OT Visit Diagnosis: Unsteadiness on feet (R26.81)   Activity Tolerance     Patient Left in bed;with call bell/phone within reach;with bed alarm set;with family/visitor present (Daughter present at bedside.)   Nurse Communication          Time: [id number] OT Time Calculation (min): 21 min  Charges: OT General Charges $OT Visit: 1 Visit OT Treatments $Self Care/Home Management : 8-22 mins  Shadara Lopez H. OTR/L Supplemental OT, Department of rehab services [phone]   Kosisochukwu Goldberg R H. 05/09/2020, 2:58 PM

## 2020-05-09 NOTE — Progress Notes (Signed)
   05/09/20 1459  Mechanical VTE Prophylaxis (All Areas)  Mechanical VTE Prophylaxis Antiembolism stockings, knee (TED hose)  Mechanical VTE Prophylaxis Intervention On

## 2020-05-09 NOTE — TOC Transition Note (Addendum)
Transition of Care [facility]) - CM/SW Discharge Note   Patient Details  Name: Randy Wood MRN: [id number] Date of Birth: Jul 05, 1940  [facility] CM/SW Contact:  Randy Mayo, RN Phone Number: 05/09/2020, 9:51 AM   Clinical Narrative:    NCM received referral from [place] that patient would like to go home with [facility] servcies.  NCM spoke with patient and daughter at the bedside, confirmed that they  want to go home with [facility] with [facility], and need ptar transport.  Daughter Randy Wood states yes she confirmed this information.  She states he has been ambulating pretty well.  She states he will need a rolling walker. Choice was given to daughter by previous CSW working with patient.  NCM made referral to [place] with [facility] for [place], [place], [place].  She is able to take referral.  Soc will begin 24 to 48 hrs post dc.  Daughter would like for apt times to be scheduled with her. He lives with daughter. NCM made referral to [facility] with [employer] for the walker,The walker will be delivered to the room to the daughter today.     Final next level of care: [place] Barriers to Discharge: Continued Medical Work up   Patient Goals and CMS Choice Patient states their goals for this hospitalization and ongoing recovery are:: home with [facility] CMS Medicare.gov Compare Post Acute Care list provided to:: Patient Represenative (must comment) Choice offered to / list presented to : Adult Children  Discharge Placement                       Discharge Plan and Services                DME Arranged: Walker rolling DME Agency: [employer] Date DME Agency Contacted: 05/09/20 Time DME Agency Contacted: [phone] Representative spoke with at DME Agency: [place]: PT,OT,Nurse's [place] Agency: [facility] [place] Determinants of Health (SDOH) Interventions     Readmission Risk Interventions No flowsheet data found.

## 2020-05-09 NOTE — Progress Notes (Signed)
PROGRESS NOTE    Randy Wood  YNW:[id number] DOB: 06-Jan-1941 DOA: 05/03/2020 PCP: Eunice Blase, MD   Brief Narrative: 80 year old with past medical history significant for hypertension, hyperlipidemia, alcohol abuse presented with complaint of shortness of breath and loss of strength in his leg.  He report progressively worsening lower extremity edema since September 2021.  He reports shortness of breath on exertion.  Lower extremity DVT was ruled out with Doppler.  He report weight gain, lower extremity edema.  He admits drinking alcohol.  Was admitted with decompensated liver disease, significant ascites along with renal failure.  GI and nephrology consulted.  Patient renal function has improved.  He has been tolerating Lasix.  His ascites fluid grew ESBL E. Coli.     Assessment & Plan:   Principal Problem:   Infection due to ESBL-producing Escherichia coli Active Problems:   Essential hypertension   Gastro-esophageal reflux disease without esophagitis   Osteoarthritis of knee   Anasarca   Liver failure (HCC)   Acute renal failure (ARF) (HCC)   Dyspnea on exertion   Macrocytic anemia   Transaminitis   Hyperbilirubinemia   Cirrhosis (HCC)   SBP (spontaneous bacterial peritonitis) ([place])  1-Decompensated liver disease with anasarca/SBP: -Patient complaining of progressively worsening abdominal and lower extremity swelling. -CT imaging showed extensive ascites and concern for cirrhosis.  Meld score 23 on admission. -Suspect liver failure secondary to alcohol use. -Patient underwent paracentesis yielding 5.2 L of clear fluid. -Felt to have hepatorenal syndrome. -Received IV Lasix and albumin during admission. -Continue with oral Lasix.  He will need a spironolactone if renal function remains stable -Peritoneal fluid grew E. coli ESBL.-On meropenem 12/8. -Had low grade fever last night, plan for repeat paracentesis.  Plan to continue with IV antibiotics. Day 4 IV  meropenem/invanz.    2-AKI: Creatinine on admission 2.8. -Related to ibuprofen use  versus hepatorenal syndrome. -improved.  -Cr down to 1.3   3-Dyspnea  on exertion: Chest x ray negative for infection or edema.   SBP, E coli ESBL;  On Invanz day 4 IV antibiotics.  Per ID no need for SBP prophylaxis at discharge..  Per ID continue with IV invanz while in the hospital then transition to Bactrim at discharge. Needs 5 days treatment.   HTN;  On propanolol and amlodipine at home.  Microcytic anemia: Hemoglobin M57 [id number], folic acid 29  Osteoarthritis, acute on chronic: Voltaren gel needed  Transaminitis with hyperbilirubinemia: Likely secondary to decompensated liver disease  Alcohol abuse: On CIWA protocol with Ativan He is determined to stop drinking alcohol  GERD: Continue with PPI  Hypokalemia; replete orally.  Diarrhea; hold lactulose today.        Estimated body mass index is 31.82 kg/m as calculated from the following:   Height as of this encounter: 5\' 11"  (1.803 m).   Weight as of this encounter: 103.5 kg.   DVT prophylaxis: SCD Code Status: Full code Family Communication: care discussed with daughter who was at bedside.  Disposition Plan:  Status is: Inpatient  Remains inpatient appropriate because:IV treatments appropriate due to intensity of illness or inability to take PO   Dispo: The patient is from: Home              Anticipated d/c is to: home vs SNF              Anticipated d/c date is: 2 days              Patient currently is not  medically stable to d/c.   Difficult to place patient No        Consultants:   GI  ID  Procedures:     Antimicrobials:    Subjective: He had low grade fever.  He had some diarrhea.  He notice less urine out put. Will monitor urine out put. His renal function is stable today.      Objective: Vitals:   05/08/20 2007 05/09/20 0113 05/09/20 0623 05/09/20 1151  BP: (!) 119/59 121/63 124/65  116/61  Pulse: 91 81 81 76  Resp: 20 20 18 17   Temp: 100.2 F (37.9 C) 98.8 F (37.1 C) 98.6 F (37 C) 98.6 F (37 C)  TempSrc: Oral Oral Oral Oral  SpO2: 95% 97% 97% 98%  Weight:   103.5 kg   Height:        Intake/Output Summary (Last 24 hours) at 05/09/2020 1452 Last data filed at 05/09/2020 1113 Gross per 24 hour  Intake 240 ml  Output 450 ml  Net -210 ml   Filed Weights   05/07/20 0332 05/08/20 0319 05/09/20 0623  Weight: 100.7 kg 100.7 kg 103.5 kg    Examination:  General exam: NAD Respiratory system: CTA Cardiovascular system: S 1, S 2 RRR Gastrointestinal system: BS present, soft, nt Central nervous system: alert Extremities: trace edema    Data Reviewed: I have personally reviewed following labs and imaging studies  CBC: Recent Labs  Lab 05/03/20 1152 05/05/20 0411 05/06/20 0215 05/09/20 0246  WBC 7.3 5.8 5.9 8.2  HGB 12.2* 10.5* 10.8* 10.9*  HCT 36.7* 31.1* 30.6* 32.5*  MCV 110.2* 109.5* 106.6* 109.8*  PLT 182 131* 131* PLATELET CLUMPS NOTED ON SMEAR, COUNT APPEARS DECREASED   Basic Metabolic Panel: Recent Labs  Lab 05/05/20 0411 05/06/20 0215 05/07/20 0338 05/08/20 0302 05/09/20 0246  NA 139 141 144 144 140  K 3.4* 3.1* 3.3* 3.1* 3.2*  CL 106 106 106 107 103  CO2 21* 23 26 27 26   GLUCOSE 136* 152* 116* 108* 115*  BUN 28* 23 19 16 17   CREATININE 2.48* 2.15* 1.54* 1.36* 1.34*  CALCIUM 8.0* 8.0* 8.2* 7.9* 7.7*  MG 1.6*  --   --   --   --   PHOS 2.3*  --  2.5  --   --    GFR: Estimated Creatinine Clearance: 53.9 mL/min (A) (by C-G formula based on SCr of 1.34 mg/dL (H)). Liver Function Tests: Recent Labs  Lab 05/04/20 0753 05/05/20 0411 05/06/20 0215 05/07/20 0338 05/08/20 0302  AST 104* 77* 77*  --  68*  ALT 64* 45* 39  --  30  ALKPHOS 212* 159* 148*  --  110  BILITOT 3.7* 2.3* 2.2*  --  1.8*  PROT 6.3* 5.3* 5.3*  --  4.9*  ALBUMIN 2.7* 2.7* 2.8* 3.0* 2.6*   No results for input(s): LIPASE, AMYLASE in the last 168  hours. Recent Labs  Lab 05/05/20 1016  AMMONIA 47*   Coagulation Profile: Recent Labs  Lab 05/04/20 0902 05/05/20 0411  INR 1.3* 1.3*   Cardiac Enzymes: No results for input(s): CKTOTAL, CKMB, CKMBINDEX, TROPONINI in the last 168 hours. BNP (last 3 results) No results for input(s): PROBNP in the last 8760 hours. HbA1C: No results for input(s): HGBA1C in the last 72 hours. CBG: No results for input(s): GLUCAP in the last 168 hours. Lipid Profile: No results for input(s): CHOL, HDL, LDLCALC, TRIG, CHOLHDL, LDLDIRECT in the last 72 hours. Thyroid Function Tests: No results for input(s): TSH,  T4TOTAL, FREET4, T3FREE, THYROIDAB in the last 72 hours. Anemia Panel: No results for input(s): VITAMINB12, FOLATE, FERRITIN, TIBC, IRON, RETICCTPCT in the last 72 hours. Sepsis Labs: No results for input(s): PROCALCITON, LATICACIDVEN in the last 168 hours.  Recent Results (from the past 240 hour(s))  SARS Coronavirus 2 by RT PCR (hospital order, performed in [facility] hospital lab) Nasopharyngeal Nasopharyngeal Swab     Status: None   Collection Time: 05/04/20  7:53 AM   Specimen: Nasopharyngeal Swab  Result Value Ref Range Status   SARS Coronavirus 2 NEGATIVE NEGATIVE Final    Comment: (NOTE) SARS-CoV-2 target nucleic acids are NOT DETECTED.  The SARS-CoV-2 RNA is generally detectable in upper and lower respiratory specimens during the acute phase of infection. The lowest concentration of SARS-CoV-2 viral copies this assay can detect is 250 copies / mL. A negative result does not preclude SARS-CoV-2 infection and should not be used as the sole basis for treatment or other patient management decisions.  A negative result may occur with improper specimen collection / handling, submission of specimen other than nasopharyngeal swab, presence of viral mutation(s) within the areas targeted by this assay, and inadequate number of viral copies (<250 copies / mL). A negative result must be  combined with clinical observations, patient history, and epidemiological information.  Fact Sheet for Patients:   StrictlyIdeas.no  Fact Sheet for Healthcare Providers: BankingDealers.co.za  This test is not yet approved or  cleared by the [place] FDA and has been authorized for detection and/or diagnosis of SARS-CoV-2 by FDA under an Emergency Use Authorization (EUA).  This EUA will remain in effect (meaning this test can be used) for the duration of the COVID-19 declaration under Section 564(b)(1) of the Act, 21 U.S.C. section 360bbb-3(b)(1), unless the authorization is terminated or revoked sooner.  Performed at [facility], [place] [address], [place], [place] [address]   Gram stain     Status: None   Collection Time: 05/04/20 11:14 AM   Specimen: Abdomen; Peritoneal Fluid  Result Value Ref Range Status   Specimen Description ABDOMEN  Final   Special Requests PERITONEAL FLUID  Final   Gram Stain   Final    WBC PRESENT,BOTH PMN AND MONONUCLEAR NO ORGANISMS SEEN CYTOSPIN SMEAR Performed at [facility], [address], [place], [place] [address]    Report Status 05/04/2020 FINAL  Final  Culture, body fluid-bottle     Status: Abnormal   Collection Time: 05/04/20 11:14 AM   Specimen: Abdomen  Result Value Ref Range Status   Specimen Description ABDOMEN PERITONEAL FLUID  Final   Special Requests   Final    BOTTLES DRAWN AEROBIC ONLY Blood Culture adequate volume   Gram Stain   Final    GRAM NEGATIVE RODS AEROBIC BOTTLE ONLY CRITICAL RESULT CALLED TO, READ BACK BY AND VERIFIED WITH: RN MIKE FUTRELL BY MESSAN H. AT 7124 ON 05/05/2020 Performed at [facility], [place] [address], [place], [place] [address]    Culture (A)  Final    ESCHERICHIA COLI Confirmed Extended Spectrum Beta-Lactamase Producer (ESBL).  In bloodstream infections from ESBL organisms, carbapenems are preferred over piperacillin/tazobactam.  They are shown to have a lower risk of mortality.    Report Status 05/07/2020 FINAL  Final   Organism ID, Bacteria ESCHERICHIA COLI  Final      Susceptibility   Escherichia coli - MIC*    AMPICILLIN >=32 RESISTANT Resistant     CEFAZOLIN >=64 RESISTANT Resistant  CEFEPIME 16 RESISTANT Resistant     CEFTAZIDIME RESISTANT Resistant     CEFTRIAXONE >=64 RESISTANT Resistant     CIPROFLOXACIN >=4 RESISTANT Resistant     GENTAMICIN <=1 SENSITIVE Sensitive     IMIPENEM <=0.25 SENSITIVE Sensitive     TRIMETH/SULFA <=20 SENSITIVE Sensitive     AMPICILLIN/SULBACTAM 4 SENSITIVE Sensitive     PIP/TAZO <=4 SENSITIVE Sensitive     * ESCHERICHIA COLI  Culture, blood (routine x 2)     Status: None (Preliminary result)   Collection Time: 05/07/20  2:23 PM   Specimen: BLOOD  Result Value Ref Range Status   Specimen Description BLOOD RIGHT ANTECUBITAL  Final   Special Requests   Final    BOTTLES DRAWN AEROBIC AND ANAEROBIC Blood Culture adequate volume   Culture   Final    NO GROWTH 2 DAYS Performed at [facility], [place] [address], [place], [place] [address]    Report Status PENDING  Incomplete  Culture, blood (routine x 2)     Status: None (Preliminary result)   Collection Time: 05/07/20  2:23 PM   Specimen: BLOOD LEFT HAND  Result Value Ref Range Status   Specimen Description BLOOD LEFT HAND  Final   Special Requests   Final    BOTTLES DRAWN AEROBIC AND ANAEROBIC Blood Culture adequate volume   Culture   Final    NO GROWTH 2 DAYS Performed at [facility], [place] [address] [place], [place] [address]    Report Status PENDING  Incomplete         Radiology Studies: No results found.      Scheduled Meds: . brimonidine  1 drop Left Eye BID  . diclofenac Sodium  2 g Topical BID  . dorzolamide-timolol  1 drop Left Eye BID  . fluticasone  1 spray Each Nare Daily  . folic acid  1 mg Oral Daily  . furosemide  40 mg Oral Daily  . multivitamin with minerals  1 tablet  Oral Daily  . pantoprazole  40 mg Oral Daily  . sodium chloride flush  3 mL Intravenous Q12H  . thiamine  100 mg Oral Daily   Or  . thiamine  100 mg Intravenous Daily   Continuous Infusions: . sodium chloride 250 mL (05/05/20 0845)  . ertapenem 1,000 mg (05/08/20 1632)     LOS: 5 days    Time spent: 35 minutes.     Elmarie Shiley, MD [facility]   If 7PM-7AM, please contact night-coverage [email or web address]  05/09/2020, 2:52 PM

## 2020-05-09 NOTE — Care Management Important Message (Signed)
Important Message  Patient Details  Name: Randy Wood MRN: [id number] Date of Birth: 1940/08/21   Medicare Important Message Given:  Yes  Pt. On Contact Precautions,left IM letter with pt. RN.     Holli Humbles Smith 05/09/2020, 9:36 AM

## 2020-05-09 NOTE — Procedures (Signed)
Ultrasound-guided diagnostic and therapeutic paracentesis performed yielding 4.4 liters of yellow fluid. No immediate complications. A portion of the fluid was sent to the lab for preordered studies. EBL none.

## 2020-05-09 NOTE — Progress Notes (Addendum)
[facility] Gastroenterology Progress Note  Randy Wood 80 y.o. [birth year]  CC:  Decompensated cirrhosis, suspected SBP  Subjective: Patient reports two bowel movements yesterday and one this morning.  Reports fecal incontinence at times, typically at night.  Denies melena or hematochezia.  Denies abdominal pain, nausea, or vomiting.  He did have Tmax of 100.57F overnight.  ROS : Review of Systems  Cardiovascular: Negative for chest pain and palpitations.  Gastrointestinal: Positive for diarrhea. Negative for abdominal pain, blood in stool, constipation, heartburn, melena, nausea and vomiting.   Objective: Vital signs in last 24 hours: Vitals:   05/09/20 0113 05/09/20 0623  BP: 121/63 124/65  Pulse: 81 81  Resp: 20 18  Temp: 98.8 F (37.1 C) 98.6 F (37 C)  SpO2: 97% 97%    Physical Exam:  General:  Alert, oriented, cooperative, no distress  Head:  Normocephalic, without obvious abnormality, atraumatic  Eyes:  Anicteric sclera, EOMs intact  Lungs:   Breathing comfortably on room air, respirations unlabored  Heart:  Regular rate and rhythm, S1, S2 normal  Abdomen:   Mildly distended but non-tender with bowel sounds active all four quadrants, no guarding or peritoneal signs  Extremities: Trace lower extremity edema  Neuro: Alert and oriented, no asterixis     Lab Results: Recent Labs    05/07/20 0338 05/08/20 0302 05/09/20 0246  NA 144 144 140  K 3.3* 3.1* 3.2*  CL 106 107 103  CO2 26 27 26   GLUCOSE 116* 108* 115*  BUN 19 16 17   CREATININE 1.54* 1.36* 1.34*  CALCIUM 8.2* 7.9* 7.7*  PHOS 2.5  --   --    Recent Labs    05/07/20 0338 05/08/20 0302  AST  --  68*  ALT  --  30  ALKPHOS  --  110  BILITOT  --  1.8*  PROT  --  4.9*  ALBUMIN 3.0* 2.6*   No results for input(s): WBC, NEUTROABS, HGB, HCT, MCV, PLT in the last 72 hours. No results for input(s): LABPROT, INR in the last 72 hours.    Assessment: Decompensated cirrhosis, suspected SBP; repeat US 2/8  revealed mild ascites throughout the abdomen, decreased from the prior study. -T. Bili 1.8/ AST 68/ ALT 30/ ALP 110 as of 2/9 -Blood cultures x 2 negative 2/8  AKI, improving: BUN 17/ Cr 1.34  Plan: Per ID: Continue IV while in the hospital and change to Bactrim at discharge for total treatment course of 5 days.  ID team does not recommend ongoing prophylaxis, given renal function and possibility or resistance developing.  Repeat CBC and paracentesis with analysis given increased temp of 100.57F overnight.  Decrease lactulose to 10g daily PRN.  Titrate for 2-3 soft BMs per day.   Complete abstinence from alcohol once again discussed.  2g sodium restricted diet discussed.  Continue Lasix 40 mg PO daily.  [facility] will follow.   Salley Slaughter PA-C 05/09/2020, 10:05 AM  Contact #  [phone]

## 2020-05-10 ENCOUNTER — Inpatient Hospital Stay (HOSPITAL_COMMUNITY): Payer: Medicare Other

## 2020-05-10 DIAGNOSIS — K652 Spontaneous bacterial peritonitis: Secondary | ICD-10-CM | POA: Diagnosis not present

## 2020-05-10 DIAGNOSIS — Z1612 Extended spectrum beta lactamase (ESBL) resistance: Secondary | ICD-10-CM | POA: Diagnosis not present

## 2020-05-10 DIAGNOSIS — R188 Other ascites: Secondary | ICD-10-CM | POA: Diagnosis not present

## 2020-05-10 DIAGNOSIS — K7460 Unspecified cirrhosis of liver: Secondary | ICD-10-CM | POA: Diagnosis not present

## 2020-05-10 DIAGNOSIS — A498 Other bacterial infections of unspecified site: Secondary | ICD-10-CM | POA: Diagnosis not present

## 2020-05-10 DIAGNOSIS — R601 Generalized edema: Secondary | ICD-10-CM

## 2020-05-10 LAB — ECHOCARDIOGRAM COMPLETE
Area-P 1/2: 3.12 cm2
Height: 71 in
S' Lateral: 3.2 cm
Weight: 3489.6 oz

## 2020-05-10 LAB — BASIC METABOLIC PANEL
Anion gap: 9 (ref 5–15)
BUN: 16 mg/dL (ref 8–23)
CO2: 26 mmol/L (ref 22–32)
Calcium: 7.5 mg/dL — ABNORMAL LOW (ref 8.9–10.3)
Chloride: 107 mmol/L (ref 98–111)
Creatinine, Ser: 1.09 mg/dL (ref 0.61–1.24)
GFR, Estimated: >60 mL/min (ref >60)
Glucose, Bld: 98 mg/dL (ref 70–99)
Potassium: 3.2 mmol/L — ABNORMAL LOW (ref 3.5–5.1)
Sodium: 142 mmol/L (ref 135–145)

## 2020-05-10 LAB — CYTOLOGY - NON PAP

## 2020-05-10 MED ORDER — POTASSIUM CHLORIDE CRYS ER 20 MEQ PO TBCR
40.0000 meq | EXTENDED_RELEASE_TABLET | Freq: Two times a day (BID) | ORAL | Status: DC
  Administered 2020-05-10 – 2020-05-11 (×3): 40 meq via ORAL
  Filled 2020-05-10 (×3): qty 2

## 2020-05-10 MED ORDER — SPIRONOLACTONE 25 MG PO TABS
25.0000 mg | ORAL_TABLET | Freq: Every day | ORAL | Status: DC
  Administered 2020-05-10 – 2020-05-11 (×2): 25 mg via ORAL
  Filled 2020-05-10 (×2): qty 1

## 2020-05-10 MED ORDER — PRESERVISION AREDS PO CAPS
2.0000 | ORAL_CAPSULE | Freq: Every day | ORAL | Status: DC
  Administered 2020-05-11: 2 via ORAL
  Filled 2020-05-10: qty 2

## 2020-05-10 NOTE — Progress Notes (Signed)
Physical Therapy Treatment Patient Details Name: Randy Wood MRN: [id number] DOB: [birth year] Today's Date: 05/10/2020    History of Present Illness Patient is a 80 y/o male who presents with SOB, swelling. Admitted with new onset of CHF and anasarca secondary to hepatic failure. CT abd-ascites with infiltration of liver conerning for cirrhosis. s/p paracentesis 2/5. PMH includes macular degeneration, alcohol abuse and HTN. Pt had paracentesis 2/10.    PT Comments    Pt received in supine, agreeable to therapy session and with good participation and tolerance for mobility. Pt able to perform stair trial with 7" step and RW support needing min guard at most x3 steps. Pt and daughter given instruction on safety with transfers including car transfer and stair sequencing and receptive to all instruction. Pt performed gait trial using RW 187ft with Supervision and reports 3/10 modified RPE (Fatigue) at end of session. Pt continues to benefit from PT services to progress toward functional mobility goals. Continue to recommend HHPT, anticipate pt safe to discharge home once medically cleared, will continue to follow acutely.   Follow Up Recommendations  Home health PT;SNF     Equipment Recommendations  Rolling walker with 5" wheels    Recommendations for Other Services       Precautions / Restrictions Precautions Precautions: Fall Precaution Comments: edema, ascites Restrictions Weight Bearing Restrictions: No    Mobility  Bed Mobility Overal bed mobility: Needs Assistance Bed Mobility: Supine to Sit     Supine to sit: Modified independent (Device/Increase time)     General bed mobility comments: pt remained EOB with daughter present at end of session; use of bed features/HOB elevated    Transfers Overall transfer level: Needs assistance Equipment used: Rolling walker (2 wheeled) Transfers: Sit to/from Stand Sit to Stand: Supervision         General transfer comment: cues  for UE placement prior to standing; from EOB x2 reps  Ambulation/Gait Ambulation/Gait assistance: Supervision Gait Distance (Feet): 130 Feet Assistive device: Rolling walker (2 wheeled) Gait Pattern/deviations: Step-through pattern;Decreased stride length;Decreased dorsiflexion - right;Decreased dorsiflexion - left Gait velocity: grossly 0.3-0.5 m/s   General Gait Details: cues for proximity to RW, no LOB, fair gait speed   Stairs Stairs: Yes Stairs assistance: Min guard Stair Management: Forwards;With walker;Step to pattern Number of Stairs: 3 General stair comments: pt able to perform 7" step stool with step-to gait pattern x3 reps using RW, pt reports at home his step is 10" but anticipate pt will be able to perform with gait belt and +2 for safety, discussed sequencing with pt/daughter and they were receptive to instruction   Wheelchair Mobility    Modified Rankin (Stroke Patients Only)       Balance Overall balance assessment: Needs assistance Sitting-balance support: Feet supported;No upper extremity supported Sitting balance-Leahy Scale: Good     Standing balance support: During functional activity Standing balance-Leahy Scale: Fair Standing balance comment: light UE reliance on RW no LOB                            Cognition Arousal/Alertness: Awake/alert Behavior During Therapy: WFL for tasks assessed/performed Overall Cognitive Status: Within Functional Limits for tasks assessed                                 General Comments: WFL for tasks assessed. Cooperative      Exercises  General Comments General comments (skin integrity, edema, etc.): no acute s/sx distress during mobility and BP not otherwise assessed.      Pertinent Vitals/Pain Pain Assessment: 0-10 Pain Score: 1  Pain Location: abdomen post-paracentesis Pain Descriptors / Indicators: Sore Pain Intervention(s): Monitored during session;Repositioned    Home  Living                      Prior Function            PT Goals (current goals can now be found in the care plan section) Acute Rehab PT Goals Patient Stated Goal: to be independent PT Goal Formulation: With patient Time For Goal Achievement: 05/19/20 Potential to Achieve Goals: Good Progress towards PT goals: Progressing toward goals    Frequency    Min 3X/week      PT Plan Current plan remains appropriate    Co-evaluation              AM-PAC PT "6 Clicks" Mobility   Outcome Measure  Help needed turning from your back to your side while in a flat bed without using bedrails?: None Help needed moving from lying on your back to sitting on the side of a flat bed without using bedrails?: None Help needed moving to and from a bed to a chair (including a wheelchair)?: A Little Help needed standing up from a chair using your arms (e.g., wheelchair or bedside chair)?: A Little Help needed to walk in hospital room?: A Little Help needed climbing 3-5 steps with a railing? : A Little 6 Click Score: 20    End of Session Equipment Utilized During Treatment: Gait belt Activity Tolerance: Patient tolerated treatment well Patient left: in bed;with call bell/phone within reach;with family/visitor present Nurse Communication: Mobility status PT Visit Diagnosis: Muscle weakness (generalized) (M62.81);Unsteadiness on feet (R26.81)     Time: 1030-1050 PT Time Calculation (min) (ACUTE ONLY): 20 min  Charges:  $Gait Training: 8-22 mins                     Katrin Grabel P., PTA Acute Rehabilitation Services Pager: [phone] Office: [place] 05/10/2020, 11:53 AM

## 2020-05-10 NOTE — Progress Notes (Signed)
  Echocardiogram 2D Echocardiogram has been performed.  Randy Wood 05/10/2020, 1:54 PM

## 2020-05-10 NOTE — Progress Notes (Signed)
[facility]  Date of Admission:  05/03/2020       Abx: 2/08-c meropenem --> ertapenem  ASSESSMENT: Cirrhosis unclear etiology; decompensated Spontaneous bacterial ascites  Patient clinically well. Responding appropriately to abx  PLAN: 1. 7 day sbp treatment total from 2/08. Bactrim ds 1 tab bid on discharge to finish course 2. F/u gi for further cirrhotic management  Principal Problem:   Infection due to ESBL-producing Escherichia coli Active Problems:   Essential hypertension   Gastro-esophageal reflux disease without esophagitis   Osteoarthritis of knee   Anasarca   Liver failure (HCC)   Acute renal failure (ARF) (HCC)   Dyspnea on exertion   Macrocytic anemia   Transaminitis   Hyperbilirubinemia   Cirrhosis (HCC)   SBP (spontaneous bacterial peritonitis) ([place])   Scheduled Meds: . brimonidine  1 drop Left Eye BID  . diclofenac Sodium  2 g Topical BID  . dorzolamide-timolol  1 drop Left Eye BID  . fluticasone  1 spray Each Nare Daily  . folic acid  1 mg Oral Daily  . furosemide  40 mg Oral Daily  . multivitamin with minerals  1 tablet Oral Daily  . pantoprazole  40 mg Oral Daily  . potassium chloride  40 mEq Oral BID  . sodium chloride flush  3 mL Intravenous Q12H  . spironolactone  25 mg Oral Daily  . thiamine  100 mg Oral Daily   Or  . thiamine  100 mg Intravenous Daily   Continuous Infusions: . sodium chloride 250 mL (05/09/20 1534)  . ertapenem 1,000 mg (05/09/20 1535)   PRN Meds:.sodium chloride, albuterol, lactulose, lidocaine (PF)   SUBJECTIVE: Afebrile; no n/v/diarrhea Eating well  Has a temperature 100.2 on 2/09. Remains on ertapenem  Review of Systems: ROS Other ros negative  Allergies  Allergen Reactions  . Lisinopril Other (See Comments)    OBJECTIVE: Vitals:   05/09/20 1151 05/09/20 2120 05/10/20 0553 05/10/20 1205  BP: 116/61 (!) 118/57 124/61 (!) 114/55  Pulse: 76 84 74 78  Resp: 17  18 18 17   Temp: 98.6 F (37 C) 99.5 F (37.5 C) 98.8 F (37.1 C) 97.8 F (36.6 C)  TempSrc: Oral Oral Oral Oral  SpO2: 98% 95% 97% 98%  Weight:   98.9 kg   Height:       Body mass index is 30.42 kg/m.  Physical Exam No distress; well appearing, conversant Heent: normocephalic; per; conj clear Neck supple cv rrr no mrg Lungs clear abd s/nt/nd Ext no edema Neuro nonfocal  Lab Results Lab Results  Component Value Date   WBC 8.2 05/09/2020   HGB 10.9 (L) 05/09/2020   HCT 32.5 (L) 05/09/2020   MCV 109.8 (H) 05/09/2020   PLT  05/09/2020    PLATELET CLUMPS NOTED ON SMEAR, COUNT APPEARS DECREASED    Lab Results  Component Value Date   CREATININE 1.09 05/10/2020   BUN 16 05/10/2020   NA 142 05/10/2020   K 3.2 (L) 05/10/2020   CL 107 05/10/2020   CO2 26 05/10/2020    Lab Results  Component Value Date   ALT 30 05/08/2020   AST 68 (H) 05/08/2020   ALKPHOS 110 05/08/2020   BILITOT 1.8 (H) 05/08/2020     Microbiology: Recent Results (from the past 240 hour(s))  SARS Coronavirus 2 by RT PCR (hospital order, performed in [name] hospital lab) Nasopharyngeal Nasopharyngeal Swab     Status: None   Collection  Time: 05/04/20  7:53 AM   Specimen: Nasopharyngeal Swab  Result Value Ref Range Status   SARS Coronavirus 2 NEGATIVE NEGATIVE Final    Comment: (NOTE) SARS-CoV-2 target nucleic acids are NOT DETECTED.  The SARS-CoV-2 RNA is generally detectable in upper and lower respiratory specimens during the acute phase of infection. The lowest concentration of SARS-CoV-2 viral copies this assay can detect is 250 copies / mL. A negative result does not preclude SARS-CoV-2 infection and should not be used as the sole basis for treatment or other patient management decisions.  A negative result may occur with improper specimen collection / handling, submission of specimen other than nasopharyngeal swab, presence of viral mutation(s) within the areas targeted by this assay,  and inadequate number of viral copies (<250 copies / mL). A negative result must be combined with clinical observations, patient history, and epidemiological information.  Fact Sheet for Patients:   StrictlyIdeas.no  Fact Sheet for Healthcare Providers: BankingDealers.co.za  This test is not yet approved or  cleared by the [place] FDA and has been authorized for detection and/or diagnosis of SARS-CoV-2 by FDA under an Emergency Use Authorization (EUA).  This EUA will remain in effect (meaning this test can be used) for the duration of the COVID-19 declaration under Section 564(b)(1) of the Act, 21 U.S.C. section 360bbb-3(b)(1), unless the authorization is terminated or revoked sooner.  Performed at [facility], [place] [address] [place], [place] [address]   Gram stain     Status: None   Collection Time: 05/04/20 11:14 AM   Specimen: Abdomen; Peritoneal Fluid  Result Value Ref Range Status   Specimen Description ABDOMEN  Final   Special Requests PERITONEAL FLUID  Final   Gram Stain   Final    WBC PRESENT,BOTH PMN AND MONONUCLEAR NO ORGANISMS SEEN CYTOSPIN SMEAR Performed at [facility], [address], [place], [place] [address]    Report Status 05/04/2020 FINAL  Final  Culture, body fluid-bottle     Status: Abnormal   Collection Time: 05/04/20 11:14 AM   Specimen: Abdomen  Result Value Ref Range Status   Specimen Description ABDOMEN PERITONEAL FLUID  Final   Special Requests   Final    BOTTLES DRAWN AEROBIC ONLY Blood Culture adequate volume   Gram Stain   Final    GRAM NEGATIVE RODS AEROBIC BOTTLE ONLY CRITICAL RESULT CALLED TO, READ BACK BY AND VERIFIED WITH: RN MIKE FUTRELL BY MESSAN H. AT 2094 ON 05/05/2020 Performed at [facility], [place] [address], [place], [place] [address]    Culture (A)  Final    ESCHERICHIA COLI Confirmed Extended Spectrum Beta-Lactamase Producer (ESBL).  In bloodstream  infections from ESBL organisms, carbapenems are preferred over piperacillin/tazobactam. They are shown to have a lower risk of mortality.    Report Status 05/07/2020 FINAL  Final   Organism ID, Bacteria ESCHERICHIA COLI  Final      Susceptibility   Escherichia coli - MIC*    AMPICILLIN >=32 RESISTANT Resistant     CEFAZOLIN >=64 RESISTANT Resistant     CEFEPIME 16 RESISTANT Resistant     CEFTAZIDIME RESISTANT Resistant     CEFTRIAXONE >=64 RESISTANT Resistant     CIPROFLOXACIN >=4 RESISTANT Resistant     GENTAMICIN <=1 SENSITIVE Sensitive     IMIPENEM <=0.25 SENSITIVE Sensitive     TRIMETH/SULFA <=20 SENSITIVE Sensitive     AMPICILLIN/SULBACTAM 4 SENSITIVE Sensitive     PIP/TAZO <=4 SENSITIVE Sensitive     *  ESCHERICHIA COLI  Culture, blood (routine x 2)     Status: None (Preliminary result)   Collection Time: 05/07/20  2:23 PM   Specimen: BLOOD  Result Value Ref Range Status   Specimen Description BLOOD RIGHT ANTECUBITAL  Final   Special Requests   Final    BOTTLES DRAWN AEROBIC AND ANAEROBIC Blood Culture adequate volume   Culture   Final    NO GROWTH 3 DAYS Performed at [facility], [address], [place], [place] [address]    Report Status PENDING  Incomplete  Culture, blood (routine x 2)     Status: None (Preliminary result)   Collection Time: 05/07/20  2:23 PM   Specimen: BLOOD LEFT HAND  Result Value Ref Range Status   Specimen Description BLOOD LEFT HAND  Final   Special Requests   Final    BOTTLES DRAWN AEROBIC AND ANAEROBIC Blood Culture adequate volume   Culture   Final    NO GROWTH 3 DAYS Performed at [facility], [place] [address] [place], [place] [address]    Report Status PENDING  Incomplete  Gram stain     Status: None   Collection Time: 05/09/20  4:13 PM   Specimen: PATH Cytology Peritoneal fluid  Result Value Ref Range Status   Specimen Description FLUID PERITONEAL  Final   Special Requests NONE  Final   Gram Stain   Final    RARE WBC  PRESENT, PREDOMINANTLY MONONUCLEAR NO ORGANISMS SEEN Performed at [facility], [address] [place], [place] [address]    Report Status 05/09/2020 FINAL  Final  Culture, body fluid-bottle     Status: None (Preliminary result)   Collection Time: 05/09/20  4:13 PM   Specimen: Fluid  Result Value Ref Range Status   Specimen Description FLUID PERITONEAL  Final   Special Requests BOTTLES DRAWN AEROBIC AND ANAEROBIC 10CC  Final   Culture   Final    NO GROWTH < 12 HOURS Performed at [facility], [place] [address], [place], [place] [address]    Report Status PENDING  Incomplete    Serology: Acute hep panel negative  Micro: 2/05 ascites cx esbl ecoli (s bactrim); 49 wbc 2/10 ascites cx ip; 90s wbc   Jabier Mutton, [facility] [place] [phone] pager    05/10/2020, 1:34 PM

## 2020-05-10 NOTE — Plan of Care (Signed)
  Problem: Nutrition: Goal: Adequate nutrition will be maintained Outcome: Progressing   Problem: Coping: Goal: Level of anxiety will decrease Outcome: Progressing   

## 2020-05-10 NOTE — Progress Notes (Signed)
  Mobility Specialist Criteria Algorithm Info.  Mobility Team: HOB elevated: Activity: Ambulated in hall; Dangled on edge of bed Range of motion: Active; All extremities Level of assistance: Standby assist, set-up cues, supervision of patient - no hands on Assistive device: Front wheel walker Minutes sitting in chair:  Minutes stood: 6 minutes Minutes ambulated: 6 minutes Distance ambulated (ft): 460 ft Mobility response: Tolerated well Bed Position: Semi-fowlers  Pt eager to participate in mobility this morning. He got to the EOB supine<sit and stood with supervision. Ambulated in hallway w/RW, 460 feet with slow steady gait. Tolerated ambulation well without incident or complaint, upon returning to room he declined sitting in recliner chair. He is now lying supine in bed with all needs met.   05/10/2020 10:00 AM

## 2020-05-10 NOTE — Progress Notes (Signed)
[facility] Gastroenterology Progress Note  Randy Wood 80 y.o. 01/09/1941  CC:  Decompensated cirrhosis, suspected SBP  Subjective: Patient denies abdominal pain, melena or hematochezia.  Denies nausea/vomiting.  States he had a BM last night, none thus far today.    ROS : Review of Systems  Cardiovascular: Negative for chest pain and palpitations.  Gastrointestinal: Negative for abdominal pain, blood in stool, constipation, diarrhea, heartburn, melena, nausea and vomiting.   Objective: Vital signs in last 24 hours: Vitals:   05/10/20 0553 05/10/20 1205  BP: 124/61 (!) 114/55  Pulse: 74 78  Resp: 18 17  Temp: 98.8 F (37.1 C) 97.8 F (36.6 C)  SpO2: 97% 98%    Physical Exam:  General:  Alert, oriented, cooperative, no distress  Head:  Normocephalic, without obvious abnormality, atraumatic  Eyes:  Anicteric sclera, EOMs intact  Lungs:   Breathing comfortably on room air, respirations unlabored  Heart:  Regular rate and rhythm, S1, S2 normal  Abdomen:   Soft-non-tender, non-distended, bowel sounds active all four quadrants, no guarding or peritoneal signs  Extremities: Trace lower extremity edema  Neuro: Alert and oriented, no asterixis     Lab Results: Recent Labs    05/09/20 0246 05/10/20 0655  NA 140 142  K 3.2* 3.2*  CL 103 107  CO2 26 26  GLUCOSE 115* 98  BUN 17 16  CREATININE 1.34* 1.09  CALCIUM 7.7* 7.5*   Recent Labs    05/08/20 0302  AST 68*  ALT 30  ALKPHOS 110  BILITOT 1.8*  PROT 4.9*  ALBUMIN 2.6*   Recent Labs    05/09/20 0246  WBC 8.2  HGB 10.9*  HCT 32.5*  MCV 109.8*  PLT PLATELET CLUMPS NOTED ON SMEAR, COUNT APPEARS DECREASED   No results for input(s): LABPROT, INR in the last 72 hours.    Assessment: Decompensated cirrhosis, suspected SBP; repeat US 2/8 revealed mild ascites throughout the abdomen, decreased from the prior study. -T. Bili 1.8/ AST 68/ ALT 30/ ALP 110 as of 2/9 -Blood cultures x 2 negative 2/8 -Repeat  paracentesis yesterday 2/10 yielded 4.4L.  Negative culture and gram stain, though slightly  Increased total nucleated cells (99) and neutrophil count (19) as compared to prior   AKI, improving: BUN 16/ Cr 1.09  Plan: Recommend continued antibiotics as per ID (who will re-evaluate today).    Continue 10g daily PRN.  Titrate for 2-3 soft BMs per day.   Continue Lasix 40 mg PO daily.  Plan for FU in GI clinic in approximately 1 month.    Gwendolyn Nishi PA-C 05/10/2020, 1:10 PM  Contact #  [phone]

## 2020-05-10 NOTE — Progress Notes (Addendum)
PROGRESS NOTE    Randy Wood  VFI:[id number] DOB: Aug 04, 1940 DOA: 05/03/2020 PCP: Eunice Blase, MD   Brief Narrative: 80 year old with past medical history significant for hypertension, hyperlipidemia, alcohol abuse presented with complaint of shortness of breath and loss of strength in his leg.  He report progressively worsening lower extremity edema since September 2021.  He reports shortness of breath on exertion.  Lower extremity DVT was ruled out with Doppler.  He report weight gain, lower extremity edema.  He admits drinking alcohol.  Was admitted with decompensated liver disease, significant ascites along with renal failure.  GI and nephrology consulted.  Patient renal function has improved.  He has been tolerating Lasix.  His ascites fluid grew ESBL E. Coli.     Assessment & Plan:   Principal Problem:   Infection due to ESBL-producing Escherichia coli Active Problems:   Essential hypertension   Gastro-esophageal reflux disease without esophagitis   Osteoarthritis of knee   Anasarca   Liver failure (HCC)   Acute renal failure (ARF) (HCC)   Dyspnea on exertion   Macrocytic anemia   Transaminitis   Hyperbilirubinemia   Cirrhosis (HCC)   SBP (spontaneous bacterial peritonitis) ([place])  1-Decompensated liver disease with anasarca/SBP: -Patient complaining of progressively worsening abdominal and lower extremity swelling. -CT imaging showed extensive ascites and concern for cirrhosis.  Meld score 23 on admission. -Suspect liver failure secondary to alcohol use. -Patient underwent paracentesis yielding 5.2 L of clear fluid. -Felt to have hepatorenal syndrome. -Received IV Lasix and albumin during admission. -Continue with oral Lasix.  He will need a spironolactone if renal function remains stable -Peritoneal fluid grew E. coli ESBL.-On meropenem 12/8. -Had low grade fever last night, plan for repeat paracentesis.  Plan to continue with IV antibiotics. Day 5 IV  meropenem/invanz.  -ID recommend complete 7 days of antibiotics from 2/08. Discussed with Dr Alessandra Bevels he recommend 5 days Wood of bactrim at discharge.   2-AKI: Creatinine on admission 2.8. -Related to ibuprofen use  versus hepatorenal syndrome. -improved.  -Cr down to 1.0. Plan to start spironolactone and repeat renal function tomorrow.   3-Dyspnea  on exertion: Chest x ray negative for infection or edema.  Plan to get ECHO for  completeness.   SBP, E coli ESBL;  On Invanz day 5 IV antibiotics.  Per ID no need for SBP prophylaxis at discharge..   -ID recommend complete 7 days of antibiotics from 2/08. Discussed with Dr Alessandra Bevels he recommend 5 days Wood of bactrim at discharge.  -Ascites fluids from 2/10; no growth to date.,   HTN;  On propanolol and amlodipine at home.  Microcytic anemia: Hemoglobin C16 6063, folic acid 29  Osteoarthritis, acute on chronic: Voltaren gel needed  Transaminitis with hyperbilirubinemia: Likely secondary to decompensated liver disease  Alcohol abuse: On CIWA protocol with Ativan He is determined to stop drinking alcohol  GERD: Continue with PPI  Hypokalemia; 40 meq BID Diarrhea; lactulose change to PRN       Estimated body mass index is 30.42 kg/m as calculated from the following:   Height as of this encounter: 5\' 11"  (1.803 m).   Weight as of this encounter: 98.9 kg.   DVT prophylaxis: SCD Code Status: Full code Family Communication: care discussed with daughter who was at bedside.  Disposition Plan:  Status is: Inpatient  Remains inpatient appropriate because:IV treatments appropriate due to intensity of illness or inability to take PO   Dispo: The patient is from: Home  Anticipated d/c is to: home vs SNF              Anticipated d/c date is: 2 days              Patient currently is not medically stable to d/c.   Difficult to place patient No        Consultants:   GI  ID  Procedures:      Antimicrobials:    Subjective: He feels abdomen is softer, less distended. He had fluid leak from paracentesis site. It has stopped now.  His ankle and knees pain has improved.     Objective: Vitals:   05/09/20 1151 05/09/20 2120 05/10/20 0553 05/10/20 1205  BP: 116/61 (!) 118/57 124/61 (!) 114/55  Pulse: 76 84 74 78  Resp: 17 18 18 17   Temp: 98.6 F (37 C) 99.5 F (37.5 C) 98.8 F (37.1 C) 97.8 F (36.6 C)  TempSrc: Oral Oral Oral Oral  SpO2: 98% 95% 97% 98%  Weight:   98.9 kg   Height:        Intake/Output Summary (Last 24 hours) at 05/10/2020 1624 Last data filed at 05/10/2020 1259 Gross per 24 hour  Intake 597 ml  Output 950 ml  Net -353 ml   Filed Weights   05/08/20 0319 05/09/20 0623 05/10/20 0553  Weight: 100.7 kg 103.5 kg 98.9 kg    Examination:  General exam: NAD Respiratory system: CTA Cardiovascular system: S 1, S 2 RRR Gastrointestinal system: BS present, soft, nt Central nervous system: Alert Extremities: Trace edema    Data Reviewed: I have personally reviewed following labs and imaging studies  CBC: Recent Labs  Lab 05/05/20 0411 05/06/20 0215 05/09/20 0246  WBC 5.8 5.9 8.2  HGB 10.5* 10.8* 10.9*  HCT 31.1* 30.6* 32.5*  MCV 109.5* 106.6* 109.8*  PLT 131* 131* PLATELET CLUMPS NOTED ON SMEAR, COUNT APPEARS DECREASED   Basic Metabolic Panel: Recent Labs  Lab 05/05/20 0411 05/06/20 0215 05/07/20 0338 05/08/20 0302 05/09/20 0246 05/10/20 0655  NA 139 141 144 144 140 142  K 3.4* 3.1* 3.3* 3.1* 3.2* 3.2*  CL 106 106 106 107 103 107  CO2 21* 23 26 27 26 26   GLUCOSE 136* 152* 116* 108* 115* 98  BUN 28* 23 19 16 17 16   CREATININE 2.48* 2.15* 1.54* 1.36* 1.34* 1.09  CALCIUM 8.0* 8.0* 8.2* 7.9* 7.7* 7.5*  MG 1.6*  --   --   --   --   --   PHOS 2.3*  --  2.5  --   --   --    GFR: Estimated Creatinine Clearance: 64.8 mL/min (by C-G formula based on SCr of 1.09 mg/dL). Liver Function Tests: Recent Labs  Lab 05/04/20 0753  05/05/20 0411 05/06/20 0215 05/07/20 0338 05/08/20 0302  AST 104* 77* 77*  --  68*  ALT 64* 45* 39  --  30  ALKPHOS 212* 159* 148*  --  110  BILITOT 3.7* 2.3* 2.2*  --  1.8*  PROT 6.3* 5.3* 5.3*  --  4.9*  ALBUMIN 2.7* 2.7* 2.8* 3.0* 2.6*   No results for input(s): LIPASE, AMYLASE in the last 168 hours. Recent Labs  Lab 05/05/20 1016  AMMONIA 47*   Coagulation Profile: Recent Labs  Lab 05/04/20 0902 05/05/20 0411  INR 1.3* 1.3*   Cardiac Enzymes: No results for input(s): CKTOTAL, CKMB, CKMBINDEX, TROPONINI in the last 168 hours. BNP (last 3 results) No results for input(s): PROBNP in the last 8760  hours. HbA1C: No results for input(s): HGBA1C in the last 72 hours. CBG: No results for input(s): GLUCAP in the last 168 hours. Lipid Profile: No results for input(s): CHOL, HDL, LDLCALC, TRIG, CHOLHDL, LDLDIRECT in the last 72 hours. Thyroid Function Tests: No results for input(s): TSH, T4TOTAL, FREET4, T3FREE, THYROIDAB in the last 72 hours. Anemia Panel: No results for input(s): VITAMINB12, FOLATE, FERRITIN, TIBC, IRON, RETICCTPCT in the last 72 hours. Sepsis Labs: No results for input(s): PROCALCITON, LATICACIDVEN in the last 168 hours.  Recent Results (from the past 240 hour(s))  SARS Coronavirus 2 by RT PCR (hospital order, performed in [facility] hospital lab) Nasopharyngeal Nasopharyngeal Swab     Status: None   Collection Time: 05/04/20  7:53 AM   Specimen: Nasopharyngeal Swab  Result Value Ref Range Status   SARS Coronavirus 2 NEGATIVE NEGATIVE Final    Comment: (NOTE) SARS-CoV-2 target nucleic acids are NOT DETECTED.  The SARS-CoV-2 RNA is generally detectable in upper and lower respiratory specimens during the acute phase of infection. The lowest concentration of SARS-CoV-2 viral copies this assay can detect is 250 copies / mL. A negative result does not preclude SARS-CoV-2 infection and should not be used as the sole basis for treatment or other patient  management decisions.  A negative result may occur with improper specimen collection / handling, submission of specimen other than nasopharyngeal swab, presence of viral mutation(s) within the areas targeted by this assay, and inadequate number of viral copies (<250 copies / mL). A negative result must be combined with clinical observations, patient history, and epidemiological information.  Fact Sheet for Patients:   StrictlyIdeas.no  Fact Sheet for Healthcare Providers: BankingDealers.co.za  This test is not yet approved or  cleared by the [place] FDA and has been authorized for detection and/or diagnosis of SARS-CoV-2 by FDA under an Emergency Use Authorization (EUA).  This EUA will remain in effect (meaning this test can be used) for the duration of the COVID-19 declaration under Section 564(b)(1) of the Act, 21 U.S.C. section 360bbb-3(b)(1), unless the authorization is terminated or revoked sooner.  Performed at [facility], [place] [address] [place], [place] [address]   Gram stain     Status: None   Collection Time: 05/04/20 11:14 AM   Specimen: Abdomen; Peritoneal Fluid  Result Value Ref Range Status   Specimen Description ABDOMEN  Final   Special Requests PERITONEAL FLUID  Final   Gram Stain   Final    WBC PRESENT,BOTH PMN AND MONONUCLEAR NO ORGANISMS SEEN CYTOSPIN SMEAR Performed at [facility], [address] [place], [place] [address]    Report Status 05/04/2020 FINAL  Final  Culture, body fluid-bottle     Status: Abnormal   Collection Time: 05/04/20 11:14 AM   Specimen: Abdomen  Result Value Ref Range Status   Specimen Description ABDOMEN PERITONEAL FLUID  Final   Special Requests   Final    BOTTLES DRAWN AEROBIC ONLY Blood Culture adequate volume   Gram Stain   Final    GRAM NEGATIVE RODS AEROBIC BOTTLE ONLY CRITICAL RESULT CALLED TO, READ BACK BY AND VERIFIED WITH: RN MIKE FUTRELL BY MESSAN  H. AT 8101 ON 05/05/2020 Performed at [facility], [place] [address], [place], [place] [address]    Culture (A)  Final    ESCHERICHIA COLI Confirmed Extended Spectrum Beta-Lactamase Producer (ESBL).  In bloodstream infections from ESBL organisms, carbapenems are preferred over piperacillin/tazobactam. They are shown to have a lower risk  of mortality.    Report Status 05/07/2020 FINAL  Final   Organism ID, Bacteria ESCHERICHIA COLI  Final      Susceptibility   Escherichia coli - MIC*    AMPICILLIN >=32 RESISTANT Resistant     CEFAZOLIN >=64 RESISTANT Resistant     CEFEPIME 16 RESISTANT Resistant     CEFTAZIDIME RESISTANT Resistant     CEFTRIAXONE >=64 RESISTANT Resistant     CIPROFLOXACIN >=4 RESISTANT Resistant     GENTAMICIN <=1 SENSITIVE Sensitive     IMIPENEM <=0.25 SENSITIVE Sensitive     TRIMETH/SULFA <=20 SENSITIVE Sensitive     AMPICILLIN/SULBACTAM 4 SENSITIVE Sensitive     PIP/TAZO <=4 SENSITIVE Sensitive     * ESCHERICHIA COLI  Culture, blood (routine x 2)     Status: None (Preliminary result)   Collection Time: 05/07/20  2:23 PM   Specimen: BLOOD  Result Value Ref Range Status   Specimen Description BLOOD RIGHT ANTECUBITAL  Final   Special Requests   Final    BOTTLES DRAWN AEROBIC AND ANAEROBIC Blood Culture adequate volume   Culture   Final    NO GROWTH 3 DAYS Performed at [facility], [address], [place], [place] [address]    Report Status PENDING  Incomplete  Culture, blood (routine x 2)     Status: None (Preliminary result)   Collection Time: 05/07/20  2:23 PM   Specimen: BLOOD LEFT HAND  Result Value Ref Range Status   Specimen Description BLOOD LEFT HAND  Final   Special Requests   Final    BOTTLES DRAWN AEROBIC AND ANAEROBIC Blood Culture adequate volume   Culture   Final    NO GROWTH 3 DAYS Performed at [facility], [place] [address], [place], [place] [address]    Report Status PENDING  Incomplete  Gram stain     Status: None    Collection Time: 05/09/20  4:13 PM   Specimen: PATH Cytology Peritoneal fluid  Result Value Ref Range Status   Specimen Description FLUID PERITONEAL  Final   Special Requests NONE  Final   Gram Stain   Final    RARE WBC PRESENT, PREDOMINANTLY MONONUCLEAR NO ORGANISMS SEEN Performed at [facility], [address], [place], [place] [address]    Report Status 05/09/2020 FINAL  Final  Culture, body fluid-bottle     Status: None (Preliminary result)   Collection Time: 05/09/20  4:13 PM   Specimen: Fluid  Result Value Ref Range Status   Specimen Description FLUID PERITONEAL  Final   Special Requests BOTTLES DRAWN AEROBIC AND ANAEROBIC 10CC  Final   Culture   Final    NO GROWTH < 12 HOURS Performed at [facility], [place] [address], [place], [place] [address]    Report Status PENDING  Incomplete         Radiology Studies: ECHOCARDIOGRAM COMPLETE  Result Date: 05/10/2020    ECHOCARDIOGRAM REPORT   Patient Name:   Randy Wood Date of Exam: 05/10/2020 Medical Rec #:  [id number]      Height:       71.0 in Accession #:    [id number]     Weight:       218.1 lb Date of Birth:  1940/07/27       BSA:          2.188 m Patient Age:    75 years       BP:  114/55 mmHg Patient Gender: M              HR:           78 bpm. Exam Location:  Inpatient Procedure: 2D Echo Indications:    edema  History:        Patient has no prior history of Echocardiogram examinations.                 Risk Factors:Hypertension.  Sonographer:    Johny Chess Referring Phys: [phone] Robbin Loughmiller A Khaliyah [employer] IMPRESSIONS  1. Left ventricular ejection fraction, by estimation, is 60 to 65%. The left ventricle has normal function. The left ventricle has no regional wall motion abnormalities. Left ventricular diastolic parameters are consistent with Grade I diastolic dysfunction (impaired relaxation).  2. Right ventricular systolic function is normal. The right ventricular size is normal. Tricuspid regurgitation signal is  inadequate for assessing PA pressure.  3. The mitral valve is grossly normal. Trivial mitral valve regurgitation.  4. The aortic valve is tricuspid. Aortic valve regurgitation is not visualized. Mild aortic valve sclerosis is present, with no evidence of aortic valve stenosis.  5. The inferior vena cava is normal in size with greater than 50% respiratory variability, suggesting right atrial pressure of 3 mmHg. Comparison(s): No prior Echocardiogram. FINDINGS  Left Ventricle: Left ventricular ejection fraction, by estimation, is 60 to 65%. The left ventricle has normal function. The left ventricle has no regional wall motion abnormalities. The left ventricular internal cavity size was normal in size. There is  no left ventricular hypertrophy. Left ventricular diastolic parameters are consistent with Grade I diastolic dysfunction (impaired relaxation). Indeterminate filling pressures. Right Ventricle: The right ventricular size is normal. No increase in right ventricular wall thickness. Right ventricular systolic function is normal. Tricuspid regurgitation signal is inadequate for assessing PA pressure. Left Atrium: Left atrial size was normal in size. Right Atrium: Right atrial size was normal in size. Pericardium: There is no evidence of pericardial effusion. Mitral Valve: The mitral valve is grossly normal. Trivial mitral valve regurgitation. Tricuspid Valve: The tricuspid valve is grossly normal. Tricuspid valve regurgitation is trivial. Aortic Valve: The aortic valve is tricuspid. Aortic valve regurgitation is not visualized. Mild aortic valve sclerosis is present, with no evidence of aortic valve stenosis. Pulmonic Valve: The pulmonic valve was normal in structure. Pulmonic valve regurgitation is not visualized. Aorta: The aortic root and ascending aorta are structurally normal, with no evidence of dilitation. Venous: The inferior vena cava is normal in size with greater than 50% respiratory variability,  suggesting right atrial pressure of 3 mmHg. IAS/Shunts: No atrial level shunt detected by color flow Doppler.  LEFT VENTRICLE PLAX 2D LVIDd:         4.90 cm  Diastology LVIDs:         3.20 cm  LV e' medial:    6.20 cm/s LV PW:         1.00 cm  LV E/e' medial:  10.4 LV IVS:        1.00 cm  LV e' lateral:   6.42 cm/s LVOT diam:     2.00 cm  LV E/e' lateral: 10.0 LV SV:         55 LV SV Index:   25 LVOT Area:     3.14 cm  RIGHT VENTRICLE             IVC RV S prime:     13.10 cm/s  IVC diam: 1.50 cm TAPSE (M-mode): 2.4 cm  LEFT ATRIUM           Index       RIGHT ATRIUM           Index LA diam:      3.90 cm 1.78 cm/m  RA Area:     13.40 cm LA Vol (A2C): 51.7 ml 23.63 ml/m RA Volume:   32.70 ml  14.95 ml/m  AORTIC VALVE LVOT Vmax:   90.10 cm/s LVOT Vmean:  58.800 cm/s LVOT VTI:    0.175 m  AORTA Ao Asc diam: 2.90 cm MITRAL VALVE MV Area (PHT): 3.12 cm    SHUNTS MV Decel Time: 243 msec    Systemic VTI:  0.18 m MV E velocity: 64.30 cm/s  Systemic Diam: 2.00 cm MV A velocity: 62.10 cm/s MV E/A ratio:  1.04 Lyman Bishop MD Electronically signed by Lyman Bishop MD Signature Date/Time: 05/10/2020/3:38:37 PM    Final    IR Paracentesis  Result Date: 05/09/2020 INDICATION: Patient with history of alcoholic cirrhosis, suspected spontaneous bacterial peritonitis, recurrent ascites; request received for diagnostic and therapeutic paracentesis up to 5 liters. EXAM: ULTRASOUND GUIDED DIAGNOSTIC AND THERAPEUTIC  PARACENTESIS MEDICATIONS: 1% lidocaine to skin/SQ tissue COMPLICATIONS: None immediate. PROCEDURE: Informed written consent was obtained from the patient after a discussion of the risks, benefits and alternatives to treatment. A timeout was performed prior to the initiation of the procedure. Initial ultrasound scanning demonstrates a moderate to large amount of ascites within the right lower abdominal quadrant. The right lower abdomen was prepped and draped in the usual sterile fashion. 1% lidocaine was used for local  anesthesia. Following this, a 19 gauge, 7-cm, Yueh catheter was introduced. An ultrasound image was saved for documentation purposes. The paracentesis was performed. The catheter was removed and a dressing was applied. The patient tolerated the procedure well without immediate post procedural complication. FINDINGS: A total of approximately 4.4 liters of yellow fluid was removed. Samples were sent to the laboratory as requested by the clinical team. IMPRESSION: Successful ultrasound-guided diagnostic and therapeutic paracentesis yielding 4.4 liters of peritoneal fluid. Read by: Nash Mantis Electronically Signed   By: Corrie Mckusick D.O.   On: 05/09/2020 17:05        Scheduled Meds: . brimonidine  1 drop Left Eye BID  . diclofenac Sodium  2 g Topical BID  . dorzolamide-timolol  1 drop Left Eye BID  . fluticasone  1 spray Each Nare Daily  . folic acid  1 mg Oral Daily  . furosemide  40 mg Oral Daily  . multivitamin with minerals  1 tablet Oral Daily  . pantoprazole  40 mg Oral Daily  . potassium chloride  40 mEq Oral BID  . sodium chloride flush  3 mL Intravenous Q12H  . spironolactone  25 mg Oral Daily  . thiamine  100 mg Oral Daily   Or  . thiamine  100 mg Intravenous Daily   Continuous Infusions: . sodium chloride 250 mL (05/09/20 1534)  . ertapenem 1,000 mg (05/09/20 1535)     LOS: 6 days    Time spent: 35 minutes.     Elmarie Shiley, MD [facility]   If 7PM-7AM, please contact night-coverage [email or web address]  05/10/2020, 4:24 PM

## 2020-05-11 DIAGNOSIS — Z1612 Extended spectrum beta lactamase (ESBL) resistance: Secondary | ICD-10-CM | POA: Diagnosis not present

## 2020-05-11 DIAGNOSIS — A498 Other bacterial infections of unspecified site: Secondary | ICD-10-CM | POA: Diagnosis not present

## 2020-05-11 LAB — BASIC METABOLIC PANEL
Anion gap: 8 (ref 5–15)
BUN: 15 mg/dL (ref 8–23)
CO2: 26 mmol/L (ref 22–32)
Calcium: 7.6 mg/dL — ABNORMAL LOW (ref 8.9–10.3)
Chloride: 107 mmol/L (ref 98–111)
Creatinine, Ser: 1.06 mg/dL (ref 0.61–1.24)
GFR, Estimated: >60 mL/min (ref >60)
Glucose, Bld: 106 mg/dL — ABNORMAL HIGH (ref 70–99)
Potassium: 3.6 mmol/L (ref 3.5–5.1)
Sodium: 141 mmol/L (ref 135–145)

## 2020-05-11 MED ORDER — FOLIC ACID 1 MG PO TABS: 1.0000 mg | ORAL_TABLET | Freq: Every day | ORAL | 0 refills | Status: DC

## 2020-05-11 MED ORDER — SULFAMETHOXAZOLE-TRIMETHOPRIM 800-160 MG PO TABS: 1.0000 | ORAL_TABLET | Freq: Two times a day (BID) | ORAL | 0 refills | Status: AC

## 2020-05-11 MED ORDER — POTASSIUM CHLORIDE CRYS ER 10 MEQ PO TBCR: 10.0000 meq | EXTENDED_RELEASE_TABLET | Freq: Every day | ORAL | 0 refills | Status: DC

## 2020-05-11 MED ORDER — SPIRONOLACTONE 25 MG PO TABS: 25.0000 mg | ORAL_TABLET | Freq: Every day | ORAL | 3 refills | Status: DC

## 2020-05-11 MED ORDER — LACTULOSE 10 GM/15ML PO SOLN: 10.0000 g | Freq: Every day | ORAL | 0 refills | Status: DC | PRN

## 2020-05-11 MED ORDER — FUROSEMIDE 40 MG PO TABS: 40.0000 mg | ORAL_TABLET | Freq: Every day | ORAL | 3 refills | Status: DC

## 2020-05-11 MED ORDER — TRAMADOL HCL 50 MG PO TABS: 25.0000 mg | ORAL_TABLET | Freq: Two times a day (BID) | ORAL | 0 refills | Status: AC | PRN

## 2020-05-11 NOTE — Discharge Instructions (Signed)
Avoid tylenol, if you need to take tylenol for pain, do not exceed 1000 mg in 24 hours.   You need to follow up with your PCP to have kidney function check.   You need to complete 5 days of antibiotics.   Limit amount of fluid intake to 1.5 L in 24 hours. Follow less 2 g sodium diet.   Do not drink alcohol, which could damage your liver.

## 2020-05-11 NOTE — TOC Transition Note (Signed)
Transition of Care [facility]) - CM/SW Discharge Note   Patient Details  Name: Randy Wood MRN: [id number] Date of Birth: 1940/06/13  Transition of Care [facility]) CM/SW Contact:  Konrad Penta, RN Phone Number: [phone] 05/11/2020, 11:01 AM   Clinical Narrative:   Made aware by MD that patient's daughter [name] is inquiring about RN follow up at home. Discussed home health RN orders being added to current home health.   Spoke with daughter [name] to make aware that home health RN will be added to existing home health pt/ot/aide orders with [facility]. [name] also reports that [name] will not need PTAR transportation after all. She will take patient home. Also confirms rolling walker was received yesterday.   Telephone call to weekend [facility] number [phone]. Spoke with Charleston Ropes to request [facility] RN be added and that patient will transition home today 05/11/20  No further needs assessed.   Final next level of care: [place] Barriers to Discharge: No Barriers Identified   Patient Goals and CMS Choice Patient states their goals for this hospitalization and ongoing recovery are:: to return home CMS Medicare.gov Compare Post Acute Care list provided to:: Patient Represenative (must comment) (daughter [name]) Choice offered to / list presented to : Adult Children  Discharge Placement                       Discharge Plan and Services                DME Arranged: Walker rolling DME Agency: NA Date DME Agency Contacted: 05/09/20 Time DME Agency Contacted: [phone] Representative spoke with at DME Agency: [place]: RN (added to existing PT/OT/AIDE orders) [facility] Agency: [facility] Date [place]: 05/11/20 Time [place]: 30 Representative spoke with at [place]: [place] ([place]) Interventions     Readmission Risk Interventions No flowsheet data found.   Marthenia Rolling, MSN, RN,BSN Inpatient [facility] Case  Manager [phone]

## 2020-05-11 NOTE — Progress Notes (Signed)
Occupational Therapy Treatment Patient Details Name: Randy Wood MRN: [id number] DOB: 09/07/1940 Today's Date: 05/11/2020    History of present illness Patient is a 80 y/o male who presents with SOB, swelling. Admitted with new onset of CHF and anasarca secondary to hepatic failure. CT abd-ascites with infiltration of liver conerning for cirrhosis. s/p paracentesis 2/5. PMH includes macular degeneration, alcohol abuse and HTN. Pt had paracentesis 2/10.   OT comments  Patient and daughter in room.  He is hopeful to go home today or tomorrow.  Daughter brought in pictures of the home setting.  She has already prepared the home by removing obstacles and widening paths to accommodate his RW.  OT can continue to follow in the acute setting, but he is nearing max functional potential here in the acute setting.  HH OT/PT have been arranged, and should help with a safe transition.    Follow Up Recommendations  Home health OT    Equipment Recommendations  None recommended by OT    Recommendations for Other Services      Precautions / Restrictions Precautions Precautions: Fall Precaution Comments: edema, ascites Restrictions Weight Bearing Restrictions: No       Mobility Bed Mobility   Bed Mobility: Sit to Supine       Sit to supine: Supervision;Min guard      Transfers Overall transfer level: Needs assistance Equipment used: Rolling walker (2 wheeled) Transfers: Sit to/from Stand Sit to Stand: Supervision              Balance           Standing balance support: During functional activity;Bilateral upper extremity supported Standing balance-Leahy Scale: Fair                             ADL either performed or assessed with clinical judgement   ADL   Eating/Feeding: Independent;Sitting   Grooming: Wash/dry hands;Wash/dry face;Oral care;Standing;Supervision/safety                   Toilet Transfer: Supervision/safety;RW;Ambulation    Toileting- Water quality scientist and Hygiene: Supervision/safety;Sit to/from stand       Functional mobility during ADLs: Supervision/safety;Rolling walker       Vision Patient Visual Report: No change from baseline     Perception     Praxis      Cognition Arousal/Alertness: Awake/alert Behavior During Therapy: WFL for tasks assessed/performed Overall Cognitive Status: Within Functional Limits for tasks assessed                                                            Pertinent Vitals/ Pain       Pain Assessment: No/denies pain  Home Living                                                        Frequency  Min 2X/week        Progress Toward Goals  OT Goals(current goals can now be found in the care plan section)     Acute Rehab OT Goals Patient Stated Goal: return home OT  Goal Formulation: With patient Time For Goal Achievement: 05/20/20 Potential to Achieve Goals: Good  Plan Discharge plan remains appropriate;Frequency remains appropriate    Co-evaluation                 AM-PAC OT "6 Clicks" Daily Activity     Outcome Measure   Help from another person eating meals?: None Help from another person taking care of personal grooming?: None Help from another person toileting, which includes using toliet, bedpan, or urinal?: None Help from another person bathing (including washing, rinsing, drying)?: A Little Help from another person to put on and taking off regular upper body clothing?: None Help from another person to put on and taking off regular lower body clothing?: A Little 6 Click Score: 22    End of Session Equipment Utilized During Treatment: Rolling walker  OT Visit Diagnosis: Unsteadiness on feet (R26.81)   Activity Tolerance Patient tolerated treatment well   Patient Left in bed;with call bell/phone within reach;with family/visitor present   Nurse Communication          Time:  5102-5852 OT Time Calculation (min): 29 min  Charges: OT General Charges $OT Visit: 1 Visit OT Treatments $Self Care/Home Management : 23-37 mins  05/11/2020  Randy Wood, OTR/L  Acute Rehabilitation Services  Office:  [phone]    Randy Wood 05/11/2020, 8:41 AM

## 2020-05-11 NOTE — Discharge Summary (Signed)
Physician Discharge Summary  Randy Wood VEH:[id number] DOB: Jul 21, 1940 DOA: 05/03/2020  PCP: Eunice Blase, MD  Admit date: 05/03/2020 Discharge date: 05/11/2020  Admitted From: Home  Disposition: Home   Recommendations for Outpatient Follow-up:  1. Follow up with PCP in 1-2 weeks 2. Please obtain BMP/CBC in one week 3. Please follow up on the following pending results: follow final culture from ascites fluid results.  4. Needs follow LFT.  5. Needs Bmet to follow renal function, patient on bactrim.  6. Follow up with GI for further care of cirrhosis.  7.   Home Health: Yes, HH RN, PT, OT>   Discharge Condition: Stable.  CODE STATUS: Full code Diet recommendation: Heart Healthy  Brief/Interim Summary: 80 year old with past medical history significant for hypertension, hyperlipidemia, alcohol abuse presented with complaint of shortness of breath and loss of strength in his leg.  He report progressively worsening lower extremity edema since September 2021.  He reports shortness of breath on exertion.  Lower extremity DVT was ruled out with Doppler.  He report weight gain, lower extremity edema.  He admits drinking alcohol.  Was admitted with decompensated liver disease, significant ascites along with renal failure.  GI and nephrology consulted.  Patient renal function has improved.  He has been tolerating Lasix.  His ascites fluid grew ESBL E. Coli  -Decompensated liver disease with anasarca/SBP: -Patient presents complaining of progressively worsening abdominal and lower extremity swelling. -CT imaging showed extensive ascites and concern for cirrhosis.  Meld score 23 on admission. -Suspect liver failure secondary to alcohol use. -Patient underwent paracentesis on 2/05  yielding 5.2 L of clear fluid. -Felt to have hepatorenal syndrome. -Received IV Lasix and albumin during admission. -Continue with oral Lasix.   -Peritoneal fluid grew E. coli ESBL from paracentesis 2/05..-On  meropenem since 2/8. -Had low grade fever, repeated paracentesis 2/10. 4 L fluid removed at that time. Ascites fluid with no growth/  -received 6 days of IV invanz/meropenem.   -ID recommend to complete 7 days of antibiotics from 2/08. Ok to complete course with Bactrim at discharge/.  -Discussed with Dr Alessandra Bevels he recommend 5 days more of bactrim at discharge.   2-AKI: Creatinine on admission 2.8. -Related to ibuprofen use  versus hepatorenal syndrome. -improved.  -Cr down to 1.0. Renal function stable on spironolactone. Needs close follow up.    3-Dyspnea  on exertion: Chest x ray negative for infection or edema.  ECHO; only showed mild Diastolic dysfunction grade 1.    SBP, E coli ESBL;  Received  Invanz day 6 IV antibiotics.  Per ID no need for SBP prophylaxis at discharge..   -ID recommend complete 7 days of antibiotics from 2/08. Discussed with Dr Alessandra Bevels he recommend 5 days more of bactrim at discharge. plan to discharge on 5 days bactrim.  -Ascites fluids from 2/10; no growth to date.,   HTN;  On propanolol and amlodipine at home. Discontinue this medication, he is now on lasix and spironolactone.   Microcytic anemia: Hemoglobin stable.  [id number], folic acid 29  Osteoarthritis, acute on chronic: Advised to hold on using voltaren while on bactrim.  I have prescribe short course tramadol PRN for pain.   Transaminitis with hyperbilirubinemia: Likely secondary to decompensated liver disease  Alcohol abuse: On CIWA protocol with Ativan He is determined to stop drinking alcohol  GERD: Continue with PPI  Hypokalemia; resolved. Low dose K supplement. Need Bmet to follow level.   Diarrhea; lactulose change to PRN   Discharge Diagnoses:  Principal Problem:   Infection due to ESBL-producing Escherichia coli Active Problems:   Essential hypertension   Gastro-esophageal reflux disease without esophagitis   Osteoarthritis of knee   Anasarca   Liver  failure (HCC)   Acute renal failure (ARF) (HCC)   Dyspnea on exertion   Macrocytic anemia   Transaminitis   Hyperbilirubinemia   Cirrhosis (HCC)   SBP (spontaneous bacterial peritonitis) [facility])    Discharge Instructions  Discharge Instructions    Diet - low sodium heart healthy   Complete by: As directed    Increase activity slowly   Complete by: As directed      Allergies as of 05/11/2020      Reactions   Lisinopril Other (See Comments)      Medication List    STOP taking these medications   amLODipine 10 MG tablet Commonly known as: NORVASC   propranolol 20 MG tablet Commonly known as: INDERAL     TAKE these medications   brimonidine 0.2 % ophthalmic solution Commonly known as: ALPHAGAN Place 1 drop into the left eye in the morning and at bedtime.   dorzolamide-timolol 22.3-6.8 MG/ML ophthalmic solution Commonly known as: COSOPT Place 1 drop into the left eye 2 (two) times daily.   EPINEPHrine 0.3 mg/0.3 mL Soaj injection Commonly known as: EPI-PEN Inject 0.3 mg into the muscle as needed for anaphylaxis.   fluticasone 50 MCG/ACT nasal spray Commonly known as: FLONASE Place 1 spray into both nostrils daily.   folic acid 1 MG tablet Commonly known as: FOLVITE Take 1 tablet (1 mg total) by mouth daily. Start taking on: May 12, 2020   furosemide 40 MG tablet Commonly known as: LASIX Take 1 tablet (40 mg total) by mouth daily. Start taking on: May 12, 2020   GLUCOSAMINE-CHONDROITIN PO Take 1 capsule by mouth daily.   lactulose 10 GM/15ML solution Commonly known as: CHRONULAC Take 15 mLs (10 g total) by mouth daily as needed for mild constipation.   omeprazole 20 MG capsule Commonly known as: PRILOSEC Take 20 mg by mouth daily.   One-A-Day Mens 50+ Tabs Take 1 tablet by mouth daily.   PRESERVISION AREDS 2 PO Take 1 tablet by mouth daily.   potassium chloride 10 MEQ tablet Commonly known as: KLOR-CON Take 1 tablet (10 mEq total) by  mouth daily.   spironolactone 25 MG tablet Commonly known as: ALDACTONE Take 1 tablet (25 mg total) by mouth daily. Start taking on: May 12, 2020   sulfamethoxazole-trimethoprim 800-160 MG tablet Commonly known as: BACTRIM DS Take 1 tablet by mouth 2 (two) times daily for 5 days.   traMADol 50 MG tablet Commonly known as: Ultram Take 0.5 tablets (25 mg total) by mouth every 12 (twelve) hours as needed for up to 5 days.       [place] Follow up.   Specialty: Home Health Services Why: HHPT,HHOT, [facility] Contact information: [place] [address] [place], [facility] Follow up.   Why: rolling walker Contact information: [place] [address] [phone]        Ronald Lobo, MD. Schedule an appointment as soon as possible for a visit.   Specialty: Gastroenterology Contact information: [address] [place] [address] [phone]        Eunice Blase, MD Follow up in 3 day(s).   Specialty: Family Medicine Contact  information: [address] [place] [address] [phone]              Allergies  Allergen Reactions  . Lisinopril Other (See Comments)    Consultations:  GI  ID  Nephrology    Procedures/Studies: CT Abdomen Pelvis Wo Contrast  Result Date: 05/04/2020 CLINICAL DATA:  Abdominal distension. Intermittent abdominal pain. New onset congestive heart failure. EXAM: CT ABDOMEN AND PELVIS WITHOUT CONTRAST TECHNIQUE: Multidetector CT imaging of the abdomen and pelvis was performed following the standard protocol without IV contrast. COMPARISON:  None. FINDINGS: Lower chest: Minimal atelectasis present at the right base. Lungs are otherwise clear. Atherosclerotic calcifications are present in the descending thoracic aorta. Coronary artery calcifications are present. Heart size is normal. No significant  pericardial effusion is present. Hepatobiliary: Diffuse fatty infiltration liver is present. Abdominal ascites surround the liver. Liver margin is indistinct. No discrete mass lesion is present. Common bile duct and gallbladder are within normal limits. Pancreas: Unremarkable. No pancreatic ductal dilatation or surrounding inflammatory changes. Spleen: Normal in size without focal abnormality. Adrenals/Urinary Tract: Adrenal glands are normal bilaterally. Kidneys and ureters are unremarkable. No stone or mass lesion is present. No obstruction is present. The urinary bladder is within normal limits. Stomach/Bowel: The stomach and duodenum are within normal limits. Small bowel is unremarkable. Terminal ileum is within normal limits. Ascending and transverse colon are normal. Descending and sigmoid colon are normal. Vascular/Lymphatic: Atherosclerotic calcifications are present in the aorta branch vessels without aneurysm. No significant adenopathy is present. Reproductive: Prostate is unremarkable. Other: Extensive abdominal ascites is noted. No mass lesion is evident. No significant ventral hernia is present. Musculoskeletal: A remote superior endplate fracture and Schmorl's node at L2. Vertebral body heights are otherwise normal. Alignment is anatomic. Fused anterior osteophytes are present in the thoracic spine. No focal lytic or blastic lesions are present. The SI joints are fused bilaterally. Hips are located and within normal limits bilaterally. IMPRESSION: 1. Extensive abdominal ascites. In the absence of significant pleural effusions or edema, this is most likely hepatic in nature. 2. Diffuse fatty infiltration liver with indistinct margins suggesting cirrhosis. 3. Fusion of the SI joints bilaterally. Question ankylosing spondylitis. 4. Remote superior endplate fracture and Schmorl's node at L2. 5. Aortic Atherosclerosis (ICD10-I70.0). These results were called by telephone at the time of interpretation on  05/04/2020 at 8:17 am to provider MELANIE BELFI , who verbally acknowledged these results. Electronically Signed   By: San Morelle M.D.   On: 05/04/2020 08:20   DG Chest 2 View  Result Date: 05/03/2020 CLINICAL DATA:  Shortness of breath. EXAM: CHEST - 2 VIEW COMPARISON:  March 13, 2011. FINDINGS: The heart size and mediastinal contours are within normal limits. Both lungs are clear. No pneumothorax or pleural effusion is noted. The visualized skeletal structures are unremarkable. IMPRESSION: No active cardiopulmonary disease. Electronically Signed   By: Marijo Conception M.D.   On: 05/03/2020 12:02   US Abdomen Limited  Result Date: 05/07/2020 CLINICAL DATA:  History of recent paracentesis EXAM: LIMITED ABDOMEN ULTRASOUND FOR ASCITES TECHNIQUE: Limited ultrasound survey for ascites was performed in all four abdominal quadrants. COMPARISON:  05/04/2020 FINDINGS: Mild ascites is noted in all 4 quadrants but decreased when compared with the prior exam consistent with the recent paracentesis. IMPRESSION: Mild ascites throughout the abdomen. Decreased from the prior study. Electronically Signed   By: Inez Catalina M.D.   On: 05/07/2020 13:47   US Paracentesis  Result Date: 05/04/2020 INDICATION: Patient with elevated LFTs  presents today with large volume ascites. Interventional radiology asked to perform a therapeutic and diagnostic paracentesis. EXAM: ULTRASOUND GUIDED PARACENTESIS MEDICATIONS: 1% lidocaine 10 mL COMPLICATIONS: None immediate. PROCEDURE: Informed written consent was obtained from the patient after a discussion of the risks, benefits and alternatives to treatment. A timeout was performed prior to the initiation of the procedure. Initial ultrasound scanning demonstrates a large amount of ascites within the right lower abdominal quadrant. The right lower abdomen was prepped and draped in the usual sterile fashion. 1% lidocaine was used for local anesthesia. Following this, a 19 gauge,  7-cm, Yueh catheter was introduced. An ultrasound image was saved for documentation purposes. The paracentesis was performed. The catheter was removed and a dressing was applied. The patient tolerated the procedure well without immediate post procedural complication. FINDINGS: A total of approximately 5.2 L of clear yellow fluid was removed. Samples were sent to the laboratory as requested by the clinical team. IMPRESSION: Successful ultrasound-guided paracentesis yielding 5.2 liters of peritoneal fluid. Read by: Soyla Dryer, NP Electronically Signed   By: Sandi Mariscal M.D.   On: 05/04/2020 11:08   ECHOCARDIOGRAM COMPLETE  Result Date: 05/10/2020    ECHOCARDIOGRAM REPORT   Patient Name:   Randy Wood Date of Exam: 05/10/2020 Medical Rec #:  [id number]      Height:       71.0 in Accession #:    [id number]     Weight:       218.1 lb Date of Birth:  [birth year]       BSA:          2.188 m Patient Age:    61 years       BP:           114/55 mmHg Patient Gender: M              HR:           78 bpm. Exam Location:  Inpatient Procedure: 2D Echo Indications:    edema  History:        Patient has no prior history of Echocardiogram examinations.                 Risk Factors:Hypertension.  Sonographer:    Johny Chess Referring Phys: [phone] Haili Donofrio A Amaris Garrette IMPRESSIONS  1. Left ventricular ejection fraction, by estimation, is 60 to 65%. The left ventricle has normal function. The left ventricle has no regional wall motion abnormalities. Left ventricular diastolic parameters are consistent with Grade I diastolic dysfunction (impaired relaxation).  2. Right ventricular systolic function is normal. The right ventricular size is normal. Tricuspid regurgitation signal is inadequate for assessing PA pressure.  3. The mitral valve is grossly normal. Trivial mitral valve regurgitation.  4. The aortic valve is tricuspid. Aortic valve regurgitation is not visualized. Mild aortic valve sclerosis is present, with no evidence of  aortic valve stenosis.  5. The inferior vena cava is normal in size with greater than 50% respiratory variability, suggesting right atrial pressure of 3 mmHg. Comparison(s): No prior Echocardiogram. FINDINGS  Left Ventricle: Left ventricular ejection fraction, by estimation, is 60 to 65%. The left ventricle has normal function. The left ventricle has no regional wall motion abnormalities. The left ventricular internal cavity size was normal in size. There is  no left ventricular hypertrophy. Left ventricular diastolic parameters are consistent with Grade I diastolic dysfunction (impaired relaxation). Indeterminate filling pressures. Right Ventricle: The right ventricular size is normal. No increase in right ventricular wall thickness.  Right ventricular systolic function is normal. Tricuspid regurgitation signal is inadequate for assessing PA pressure. Left Atrium: Left atrial size was normal in size. Right Atrium: Right atrial size was normal in size. Pericardium: There is no evidence of pericardial effusion. Mitral Valve: The mitral valve is grossly normal. Trivial mitral valve regurgitation. Tricuspid Valve: The tricuspid valve is grossly normal. Tricuspid valve regurgitation is trivial. Aortic Valve: The aortic valve is tricuspid. Aortic valve regurgitation is not visualized. Mild aortic valve sclerosis is present, with no evidence of aortic valve stenosis. Pulmonic Valve: The pulmonic valve was normal in structure. Pulmonic valve regurgitation is not visualized. Aorta: The aortic root and ascending aorta are structurally normal, with no evidence of dilitation. Venous: The inferior vena cava is normal in size with greater than 50% respiratory variability, suggesting right atrial pressure of 3 mmHg. IAS/Shunts: No atrial level shunt detected by color flow Doppler.  LEFT VENTRICLE PLAX 2D LVIDd:         4.90 cm  Diastology LVIDs:         3.20 cm  LV e' medial:    6.20 cm/s LV PW:         1.00 cm  LV E/e' medial:   10.4 LV IVS:        1.00 cm  LV e' lateral:   6.42 cm/s LVOT diam:     2.00 cm  LV E/e' lateral: 10.0 LV SV:         55 LV SV Index:   25 LVOT Area:     3.14 cm  RIGHT VENTRICLE             IVC RV S prime:     13.10 cm/s  IVC diam: 1.50 cm TAPSE (M-mode): 2.4 cm LEFT ATRIUM           Index       RIGHT ATRIUM           Index LA diam:      3.90 cm 1.78 cm/m  RA Area:     13.40 cm LA Vol (A2C): 51.7 ml 23.63 ml/m RA Volume:   32.70 ml  14.95 ml/m  AORTIC VALVE LVOT Vmax:   90.10 cm/s LVOT Vmean:  58.800 cm/s LVOT VTI:    0.175 m  AORTA Ao Asc diam: 2.90 cm MITRAL VALVE MV Area (PHT): 3.12 cm    SHUNTS MV Decel Time: 243 msec    Systemic VTI:  0.18 m MV E velocity: 64.30 cm/s  Systemic Diam: 2.00 cm MV A velocity: 62.10 cm/s MV E/A ratio:  1.04 Lyman Bishop MD Electronically signed by Lyman Bishop MD Signature Date/Time: 05/10/2020/3:38:37 PM    Final    VAS [place] LOWER EXTREMITY VENOUS (DVT)  Result Date: 05/03/2020  Lower Venous DVT Study Indications: Swelling, Edema, Pain, and SOB.  Performing Technologist: Delorise Shiner RVT  Examination Guidelines: A complete evaluation includes B-mode imaging, spectral Doppler, color Doppler, and power Doppler as needed of all accessible portions of each vessel. Bilateral testing is considered an integral part of a complete examination. Limited examinations for reoccurring indications may be performed as noted. The reflux portion of the exam is performed with the patient in reverse Trendelenburg.  +---------+---------------+---------+-----------+----------+--------------+ RIGHT    CompressibilityPhasicitySpontaneityPropertiesThrombus Aging +---------+---------------+---------+-----------+----------+--------------+ CFV      Full           Yes      Yes                                 +---------+---------------+---------+-----------+----------+--------------+  SFJ      Full           Yes      Yes                                  +---------+---------------+---------+-----------+----------+--------------+ FV Prox  Full           Yes      Yes                                 +---------+---------------+---------+-----------+----------+--------------+ FV Mid   Full           Yes      Yes                                 +---------+---------------+---------+-----------+----------+--------------+ FV DistalFull           Yes      Yes                                 +---------+---------------+---------+-----------+----------+--------------+ PFV      Full                    Yes                                 +---------+---------------+---------+-----------+----------+--------------+ POP      Full           Yes      Yes                                 +---------+---------------+---------+-----------+----------+--------------+ PTV      Full                                                        +---------+---------------+---------+-----------+----------+--------------+ GSV      Full                    Yes                                 +---------+---------------+---------+-----------+----------+--------------+ SSV      Full                    Yes                                 +---------+---------------+---------+-----------+----------+--------------+   +---------+---------------+---------+-----------+----------+--------------+ LEFT     CompressibilityPhasicitySpontaneityPropertiesThrombus Aging +---------+---------------+---------+-----------+----------+--------------+ CFV      Full           Yes      Yes                                 +---------+---------------+---------+-----------+----------+--------------+ SFJ      Full  Yes      Yes                                 +---------+---------------+---------+-----------+----------+--------------+ FV Prox  Full           Yes      Yes                                  +---------+---------------+---------+-----------+----------+--------------+ FV Mid   Full           Yes      Yes                                 +---------+---------------+---------+-----------+----------+--------------+ FV DistalFull           Yes      Yes                                 +---------+---------------+---------+-----------+----------+--------------+ PFV      Full                    Yes                                 +---------+---------------+---------+-----------+----------+--------------+ POP      Full           Yes      Yes                                 +---------+---------------+---------+-----------+----------+--------------+ PTV      Full                    Yes                                 +---------+---------------+---------+-----------+----------+--------------+ GSV      Full                    Yes                                 +---------+---------------+---------+-----------+----------+--------------+ SSV      Full                    Yes                                 +---------+---------------+---------+-----------+----------+--------------+     Summary: RIGHT: - There is no evidence of deep vein thrombosis in the lower extremity. - There is no evidence of superficial venous thrombosis.  - No cystic structure found in the popliteal fossa.  LEFT: - There is no evidence of deep vein thrombosis in the lower extremity. - There is no evidence of superficial venous thrombosis.  - No cystic structure found in the popliteal fossa.  *See table(s) above for measurements and observations. Electronically signed by Servando Snare MD on 05/03/2020 at 11:09:53 AM.    Final    IR Paracentesis  Result Date: 05/09/2020 INDICATION: Patient with history of alcoholic cirrhosis, suspected spontaneous bacterial peritonitis, recurrent ascites; request received for diagnostic and therapeutic paracentesis up to 5 liters. EXAM: ULTRASOUND GUIDED DIAGNOSTIC AND  THERAPEUTIC  PARACENTESIS MEDICATIONS: 1% lidocaine to skin/SQ tissue COMPLICATIONS: None immediate. PROCEDURE: Informed written consent was obtained from the patient after a discussion of the risks, benefits and alternatives to treatment. A timeout was performed prior to the initiation of the procedure. Initial ultrasound scanning demonstrates a moderate to large amount of ascites within the right lower abdominal quadrant. The right lower abdomen was prepped and draped in the usual sterile fashion. 1% lidocaine was used for local anesthesia. Following this, a 19 gauge, 7-cm, Yueh catheter was introduced. An ultrasound image was saved for documentation purposes. The paracentesis was performed. The catheter was removed and a dressing was applied. The patient tolerated the procedure well without immediate post procedural complication. FINDINGS: A total of approximately 4.4 liters of yellow fluid was removed. Samples were sent to the laboratory as requested by the clinical team. IMPRESSION: Successful ultrasound-guided diagnostic and therapeutic paracentesis yielding 4.4 liters of peritoneal fluid. Read by: Nash Mantis Electronically Signed   By: Corrie Mckusick D.O.   On: 05/09/2020 17:05    Subjective: He is feeling tired. Could sleep last night.  He feels well to go home   Discharge Exam: Vitals:   05/11/20 0529 05/11/20 0800  BP: 125/62 (!) 107/56  Pulse: 78 86  Resp: 18 18  Temp: 98.4 F (36.9 C) 99 F (37.2 C)  SpO2: 98% 97%     General: Pt is alert, awake, not in acute distress Cardiovascular: RRR, S1/S2 +, no rubs, no gallops Respiratory: CTA bilaterally, no wheezing, no rhonchi Abdominal: Soft, NT, ND, bowel sounds + Extremities: no edema, no cyanosis    The results of significant diagnostics from this hospitalization (including imaging, microbiology, ancillary and laboratory) are listed below for reference.     Microbiology: Recent Results (from the past 240 hour(s))  SARS  Coronavirus 2 by RT PCR (hospital order, performed in [facility] hospital lab) Nasopharyngeal Nasopharyngeal Swab     Status: None   Collection Time: 05/04/20  7:53 AM   Specimen: Nasopharyngeal Swab  Result Value Ref Range Status   SARS Coronavirus 2 NEGATIVE NEGATIVE Final    Comment: (NOTE) SARS-CoV-2 target nucleic acids are NOT DETECTED.  The SARS-CoV-2 RNA is generally detectable in upper and lower respiratory specimens during the acute phase of infection. The lowest concentration of SARS-CoV-2 viral copies this assay can detect is 250 copies / mL. A negative result does not preclude SARS-CoV-2 infection and should not be used as the sole basis for treatment or other patient management decisions.  A negative result may occur with improper specimen collection / handling, submission of specimen other than nasopharyngeal swab, presence of viral mutation(s) within the areas targeted by this assay, and inadequate number of viral copies (<250 copies / mL). A negative result must be combined with clinical observations, patient history, and epidemiological information.  Fact Sheet for Patients:   StrictlyIdeas.no  Fact Sheet for Healthcare Providers: BankingDealers.co.za  This test is not yet approved or  cleared by the [place] FDA and has been authorized for detection and/or diagnosis of SARS-CoV-2 by FDA under an Emergency Use Authorization (EUA).  This EUA will remain in effect (meaning this test can be used) for the duration of the COVID-19 declaration under Section 564(b)(1) of the Act, 21 U.S.C. section 360bbb-3(b)(1), unless the authorization is  terminated or revoked sooner.  Performed at [facility], [place] [address] [place], [place] [address]   Gram stain     Status: None   Collection Time: 05/04/20 11:14 AM   Specimen: Abdomen; Peritoneal Fluid  Result Value Ref Range Status   Specimen Description ABDOMEN   Final   Special Requests PERITONEAL FLUID  Final   Gram Stain   Final    WBC PRESENT,BOTH PMN AND MONONUCLEAR NO ORGANISMS SEEN CYTOSPIN SMEAR Performed at [facility], [address], [place], [place] [address]    Report Status 05/04/2020 FINAL  Final  Culture, body fluid-bottle     Status: Abnormal   Collection Time: 05/04/20 11:14 AM   Specimen: Abdomen  Result Value Ref Range Status   Specimen Description ABDOMEN PERITONEAL FLUID  Final   Special Requests   Final    BOTTLES DRAWN AEROBIC ONLY Blood Culture adequate volume   Gram Stain   Final    GRAM NEGATIVE RODS AEROBIC BOTTLE ONLY CRITICAL RESULT CALLED TO, READ BACK BY AND VERIFIED WITH: RN MIKE FUTRELL BY MESSAN H. AT 9323 ON 05/05/2020 Performed at [facility], [place] [address] [place], [place] [address]    Culture (A)  Final    ESCHERICHIA COLI Confirmed Extended Spectrum Beta-Lactamase Producer (ESBL).  In bloodstream infections from ESBL organisms, carbapenems are preferred over piperacillin/tazobactam. They are shown to have a lower risk of mortality.    Report Status 05/07/2020 FINAL  Final   Organism ID, Bacteria ESCHERICHIA COLI  Final      Susceptibility   Escherichia coli - MIC*    AMPICILLIN >=32 RESISTANT Resistant     CEFAZOLIN >=64 RESISTANT Resistant     CEFEPIME 16 RESISTANT Resistant     CEFTAZIDIME RESISTANT Resistant     CEFTRIAXONE >=64 RESISTANT Resistant     CIPROFLOXACIN >=4 RESISTANT Resistant     GENTAMICIN <=1 SENSITIVE Sensitive     IMIPENEM <=0.25 SENSITIVE Sensitive     TRIMETH/SULFA <=20 SENSITIVE Sensitive     AMPICILLIN/SULBACTAM 4 SENSITIVE Sensitive     PIP/TAZO <=4 SENSITIVE Sensitive     * ESCHERICHIA COLI  Culture, blood (routine x 2)     Status: None (Preliminary result)   Collection Time: 05/07/20  2:23 PM   Specimen: BLOOD  Result Value Ref Range Status   Specimen Description BLOOD RIGHT ANTECUBITAL  Final   Special Requests   Final    BOTTLES DRAWN AEROBIC  AND ANAEROBIC Blood Culture adequate volume   Culture   Final    NO GROWTH 3 DAYS Performed at [facility], [address], [place], [place] [address]    Report Status PENDING  Incomplete  Culture, blood (routine x 2)     Status: None (Preliminary result)   Collection Time: 05/07/20  2:23 PM   Specimen: BLOOD LEFT HAND  Result Value Ref Range Status   Specimen Description BLOOD LEFT HAND  Final   Special Requests   Final    BOTTLES DRAWN AEROBIC AND ANAEROBIC Blood Culture adequate volume   Culture   Final    NO GROWTH 3 DAYS Performed at [facility], [place] [address] [place], [place] [address]    Report Status PENDING  Incomplete  Gram stain     Status: None   Collection Time: 05/09/20  4:13 PM   Specimen: PATH Cytology Peritoneal fluid  Result Value Ref Range Status   Specimen Description FLUID PERITONEAL  Final  Special Requests NONE  Final   Gram Stain   Final    RARE WBC PRESENT, PREDOMINANTLY MONONUCLEAR NO ORGANISMS SEEN Performed at [facility], [place] [address] [place], [place] [address]    Report Status 05/09/2020 FINAL  Final  Culture, body fluid-bottle     Status: None (Preliminary result)   Collection Time: 05/09/20  4:13 PM   Specimen: Fluid  Result Value Ref Range Status   Specimen Description FLUID PERITONEAL  Final   Special Requests BOTTLES DRAWN AEROBIC AND ANAEROBIC 10CC  Final   Culture   Final    NO GROWTH < 12 HOURS Performed at [facility], [place] [address] [place], [place] [address]    Report Status PENDING  Incomplete     Labs: BNP (last 3 results) Recent Labs    05/01/20 1348 05/03/20 1152  BNP 214* 818.5*   Basic Metabolic Panel: Recent Labs  Lab 05/05/20 0411 05/06/20 0215 05/07/20 6314 05/08/20 0302 05/09/20 0246 05/10/20 0655 05/11/20 0420  NA 139   < > 144 144 140 142 141  K 3.4*   < > 3.3* 3.1* 3.2* 3.2* 3.6  CL 106   < > 106 107 103 107 107  CO2 21*   < > 26 27 26 26 26   GLUCOSE 136*   < >  116* 108* 115* 98 106*  BUN 28*   < > 19 16 17 16 15   CREATININE 2.48*   < > 1.54* 1.36* 1.34* 1.09 1.06  CALCIUM 8.0*   < > 8.2* 7.9* 7.7* 7.5* 7.6*  MG 1.6*  --   --   --   --   --   --   PHOS 2.3*  --  2.5  --   --   --   --    < > = values in this interval not displayed.   Liver Function Tests: Recent Labs  Lab 05/05/20 0411 05/06/20 0215 05/07/20 0338 05/08/20 0302  AST 77* 77*  --  68*  ALT 45* 39  --  30  ALKPHOS 159* 148*  --  110  BILITOT 2.3* 2.2*  --  1.8*  PROT 5.3* 5.3*  --  4.9*  ALBUMIN 2.7* 2.8* 3.0* 2.6*   No results for input(s): LIPASE, AMYLASE in the last 168 hours. Recent Labs  Lab 05/05/20 1016  AMMONIA 47*   CBC: Recent Labs  Lab 05/05/20 0411 05/06/20 0215 05/09/20 0246  WBC 5.8 5.9 8.2  HGB 10.5* 10.8* 10.9*  HCT 31.1* 30.6* 32.5*  MCV 109.5* 106.6* 109.8*  PLT 131* 131* PLATELET CLUMPS NOTED ON SMEAR, COUNT APPEARS DECREASED   Cardiac Enzymes: No results for input(s): CKTOTAL, CKMB, CKMBINDEX, TROPONINI in the last 168 hours. BNP: Invalid input(s): POCBNP CBG: No results for input(s): GLUCAP in the last 168 hours. D-Dimer No results for input(s): DDIMER in the last 72 hours. Hgb A1c No results for input(s): HGBA1C in the last 72 hours. Lipid Profile No results for input(s): CHOL, HDL, LDLCALC, TRIG, CHOLHDL, LDLDIRECT in the last 72 hours. Thyroid function studies No results for input(s): TSH, T4TOTAL, T3FREE, THYROIDAB in the last 72 hours.  Invalid input(s): FREET3 Anemia work up No results for input(s): VITAMINB12, FOLATE, FERRITIN, TIBC, IRON, RETICCTPCT in the last 72 hours. Urinalysis    Component Value Date/Time   COLORURINE AMBER (A) 05/04/2020 1555   APPEARANCEUR CLOUDY (A) 05/04/2020 1555   LABSPEC 1.020 05/04/2020 1555   PHURINE 5.0 05/04/2020 1555   GLUCOSEU NEGATIVE  05/04/2020 1555   HGBUR NEGATIVE 05/04/2020 1555   BILIRUBINUR SMALL (A) 05/04/2020 1555   KETONESUR 5 (A) 05/04/2020 1555   PROTEINUR 30 (A)  05/04/2020 1555   NITRITE NEGATIVE 05/04/2020 1555   LEUKOCYTESUR MODERATE (A) 05/04/2020 1555   Sepsis Labs Invalid input(s): PROCALCITONIN,  WBC,  LACTICIDVEN Microbiology Recent Results (from the past 240 hour(s))  SARS Coronavirus 2 by RT PCR (hospital order, performed in [facility] hospital lab) Nasopharyngeal Nasopharyngeal Swab     Status: None   Collection Time: 05/04/20  7:53 AM   Specimen: Nasopharyngeal Swab  Result Value Ref Range Status   SARS Coronavirus 2 NEGATIVE NEGATIVE Final    Comment: (NOTE) SARS-CoV-2 target nucleic acids are NOT DETECTED.  The SARS-CoV-2 RNA is generally detectable in upper and lower respiratory specimens during the acute phase of infection. The lowest concentration of SARS-CoV-2 viral copies this assay can detect is 250 copies / mL. A negative result does not preclude SARS-CoV-2 infection and should not be used as the sole basis for treatment or other patient management decisions.  A negative result may occur with improper specimen collection / handling, submission of specimen other than nasopharyngeal swab, presence of viral mutation(s) within the areas targeted by this assay, and inadequate number of viral copies (<250 copies / mL). A negative result must be combined with clinical observations, patient history, and epidemiological information.  Fact Sheet for Patients:   StrictlyIdeas.no  Fact Sheet for Healthcare Providers: BankingDealers.co.za  This test is not yet approved or  cleared by the [place] FDA and has been authorized for detection and/or diagnosis of SARS-CoV-2 by FDA under an Emergency Use Authorization (EUA).  This EUA will remain in effect (meaning this test can be used) for the duration of the COVID-19 declaration under Section 564(b)(1) of the Act, 21 U.S.C. section 360bbb-3(b)(1), unless the authorization is terminated or revoked sooner.  Performed at [facility], [place] [address], [place], [place] [address]   Gram stain     Status: None   Collection Time: 05/04/20 11:14 AM   Specimen: Abdomen; Peritoneal Fluid  Result Value Ref Range Status   Specimen Description ABDOMEN  Final   Special Requests PERITONEAL FLUID  Final   Gram Stain   Final    WBC PRESENT,BOTH PMN AND MONONUCLEAR NO ORGANISMS SEEN CYTOSPIN SMEAR Performed at [facility], [address], [place], [place] [address]    Report Status 05/04/2020 FINAL  Final  Culture, body fluid-bottle     Status: Abnormal   Collection Time: 05/04/20 11:14 AM   Specimen: Abdomen  Result Value Ref Range Status   Specimen Description ABDOMEN PERITONEAL FLUID  Final   Special Requests   Final    BOTTLES DRAWN AEROBIC ONLY Blood Culture adequate volume   Gram Stain   Final    GRAM NEGATIVE RODS AEROBIC BOTTLE ONLY CRITICAL RESULT CALLED TO, READ BACK BY AND VERIFIED WITH: RN MIKE FUTRELL BY MESSAN H. AT 8466 ON 05/05/2020 Performed at [facility], [place] [address], [place], [place] [address]    Culture (A)  Final    ESCHERICHIA COLI Confirmed Extended Spectrum Beta-Lactamase Producer (ESBL).  In bloodstream infections from ESBL organisms, carbapenems are preferred over piperacillin/tazobactam. They are shown to have a lower risk of mortality.    Report Status 05/07/2020 FINAL  Final   Organism ID, Bacteria ESCHERICHIA COLI  Final      Susceptibility   Escherichia coli - MIC*  AMPICILLIN >=32 RESISTANT Resistant     CEFAZOLIN >=64 RESISTANT Resistant     CEFEPIME 16 RESISTANT Resistant     CEFTAZIDIME RESISTANT Resistant     CEFTRIAXONE >=64 RESISTANT Resistant     CIPROFLOXACIN >=4 RESISTANT Resistant     GENTAMICIN <=1 SENSITIVE Sensitive     IMIPENEM <=0.25 SENSITIVE Sensitive     TRIMETH/SULFA <=20 SENSITIVE Sensitive     AMPICILLIN/SULBACTAM 4 SENSITIVE Sensitive     PIP/TAZO <=4 SENSITIVE Sensitive     * ESCHERICHIA COLI  Culture, blood (routine x 2)      Status: None (Preliminary result)   Collection Time: 05/07/20  2:23 PM   Specimen: BLOOD  Result Value Ref Range Status   Specimen Description BLOOD RIGHT ANTECUBITAL  Final   Special Requests   Final    BOTTLES DRAWN AEROBIC AND ANAEROBIC Blood Culture adequate volume   Culture   Final    NO GROWTH 3 DAYS Performed at [facility], [place] [address] [place], [place] [address]    Report Status PENDING  Incomplete  Culture, blood (routine x 2)     Status: None (Preliminary result)   Collection Time: 05/07/20  2:23 PM   Specimen: BLOOD LEFT HAND  Result Value Ref Range Status   Specimen Description BLOOD LEFT HAND  Final   Special Requests   Final    BOTTLES DRAWN AEROBIC AND ANAEROBIC Blood Culture adequate volume   Culture   Final    NO GROWTH 3 DAYS Performed at [facility], [place] [address] [place], [place] [address]    Report Status PENDING  Incomplete  Gram stain     Status: None   Collection Time: 05/09/20  4:13 PM   Specimen: PATH Cytology Peritoneal fluid  Result Value Ref Range Status   Specimen Description FLUID PERITONEAL  Final   Special Requests NONE  Final   Gram Stain   Final    RARE WBC PRESENT, PREDOMINANTLY MONONUCLEAR NO ORGANISMS SEEN Performed at [facility], [address] [place], [place] [address]    Report Status 05/09/2020 FINAL  Final  Culture, body fluid-bottle     Status: None (Preliminary result)   Collection Time: 05/09/20  4:13 PM   Specimen: Fluid  Result Value Ref Range Status   Specimen Description FLUID PERITONEAL  Final   Special Requests BOTTLES DRAWN AEROBIC AND ANAEROBIC 10CC  Final   Culture   Final    NO GROWTH < 12 HOURS Performed at [facility], [place] [address], [place], [place] [address]    Report Status PENDING  Incomplete     Time coordinating discharge: 40 minutes  SIGNED:   Elmarie Shiley, MD  [facility]

## 2020-05-11 NOTE — Plan of Care (Signed)
  Problem: Education: Goal: Knowledge of General Education information will improve Description: Including pain rating scale, medication(s)/side effects and non-pharmacologic comfort measures Outcome: Adequate for Discharge   Problem: Health Behavior/Discharge Planning: Goal: Ability to manage health-related needs will improve Outcome: Adequate for Discharge   Problem: Clinical Measurements: Goal: Ability to maintain clinical measurements within normal limits will improve Outcome: Adequate for Discharge Goal: Will remain free from infection Outcome: Adequate for Discharge Goal: Diagnostic test results will improve Outcome: Adequate for Discharge Goal: Respiratory complications will improve Outcome: Adequate for Discharge Goal: Cardiovascular complication will be avoided Outcome: Adequate for Discharge   Problem: Activity: Goal: Risk for activity intolerance will decrease Outcome: Adequate for Discharge   Problem: Nutrition: Goal: Adequate nutrition will be maintained Outcome: Adequate for Discharge   Problem: Coping: Goal: Level of anxiety will decrease Outcome: Adequate for Discharge   Problem: Elimination: Goal: Will not experience complications related to bowel motility Outcome: Adequate for Discharge Goal: Will not experience complications related to urinary retention Outcome: Adequate for Discharge   Problem: Safety: Goal: Ability to remain free from injury will improve Outcome: Adequate for Discharge   

## 2020-05-12 LAB — CULTURE, BLOOD (ROUTINE X 2)
Culture: NO GROWTH
Culture: NO GROWTH
Special Requests: ADEQUATE
Special Requests: ADEQUATE

## 2020-05-15 LAB — CULTURE, BODY FLUID W GRAM STAIN -BOTTLE: Culture: NO GROWTH

## 2020-05-20 ENCOUNTER — Encounter (INDEPENDENT_AMBULATORY_CARE_PROVIDER_SITE_OTHER): Payer: Medicare Other | Admitting: Ophthalmology

## 2020-05-20 ENCOUNTER — Other Ambulatory Visit: Payer: Self-pay

## 2020-05-20 DIAGNOSIS — H43813 Vitreous degeneration, bilateral: Secondary | ICD-10-CM | POA: Diagnosis not present

## 2020-05-20 DIAGNOSIS — H353211 Exudative age-related macular degeneration, right eye, with active choroidal neovascularization: Secondary | ICD-10-CM

## 2020-05-20 DIAGNOSIS — I10 Essential (primary) hypertension: Secondary | ICD-10-CM | POA: Diagnosis not present

## 2020-05-20 DIAGNOSIS — H353122 Nonexudative age-related macular degeneration, left eye, intermediate dry stage: Secondary | ICD-10-CM

## 2020-05-20 DIAGNOSIS — H35033 Hypertensive retinopathy, bilateral: Secondary | ICD-10-CM | POA: Diagnosis not present

## 2020-05-21 ENCOUNTER — Ambulatory Visit (INDEPENDENT_AMBULATORY_CARE_PROVIDER_SITE_OTHER): Payer: Medicare Other | Admitting: Family Medicine

## 2020-05-21 ENCOUNTER — Encounter: Payer: Self-pay | Admitting: Family Medicine

## 2020-05-21 VITALS — BP 127/72 | HR 98 | Ht 71.0 in | Wt 204.6 lb

## 2020-05-21 DIAGNOSIS — N179 Acute kidney failure, unspecified: Secondary | ICD-10-CM

## 2020-05-21 DIAGNOSIS — K7031 Alcoholic cirrhosis of liver with ascites: Secondary | ICD-10-CM

## 2020-05-21 NOTE — Progress Notes (Signed)
Office Visit Note   Patient: Randy Wood           Date of Birth: 03-May-1940           MRN: [id number] Visit Date: 05/21/2020 Requested by: Eunice Blase, MD [place],  [place] [address] PCP: Eunice Blase, MD  Subjective: Chief Complaint  Patient presents with  . Other    Post hospital check. Feeling better. Walking better (with walker) and breathing better.     HPI: He is here for follow-up status post hospitalization for shortness of breath and weakness related to decompensated liver disease and acute renal insufficiency.  He is now on Lasix and lactulose with potassium.  He feels substantially better.  Energy level is improved, he is following a very good low-sodium whole food diet.  He has not felt this good in a while.  Incidentally he has been off proton pump inhibitor for almost 3 weeks now and is completely asymptomatic.  He would like to stay off it unless symptoms return.  He is scheduled to see cardiology later this week.  He is scheduled to see GI in early March.               ROS:   All other systems were reviewed and are negative.  Objective: Vital Signs: BP 127/72   Pulse 98   Ht 5\' 11"  (1.803 m)   Wt 204 lb 9.6 oz (92.8 kg)   SpO2 98%   BMI 28.54 kg/m   Physical Exam:  General:  Alert and oriented, in no acute distress. Pulm:  Breathing unlabored. Psy:  Normal mood, congruent affect.  CV: Regular rate and rhythm without murmurs, rubs, or gallops.  Trace bilateral lower extremity peripheral edema.  2+ radial and posterior tibial pulses. Lungs: Clear to auscultation throughout with no wheezing or areas of consolidation. Abdomen: Protuberant, nontender.    Imaging: No results found.  Assessment & Plan: 1.  Doing very well status post hospitalization for decompensated liver disease and acute renal insufficiency -Recheck CBC, BMP and hepatic profile today. -Follow-up with cardiology and GI as scheduled. -Return in 3 months for recheck,  sooner for any problems.     Procedures: No procedures performed        PMFS History: Patient Active Problem List   Diagnosis Date Noted  . Infection due to ESBL-producing Escherichia coli 05/08/2020  . Cirrhosis ([place]) 05/08/2020  . SBP (spontaneous bacterial peritonitis) ([place])   . Anasarca 05/04/2020  . Liver failure ([place]) 05/04/2020  . Acute renal failure (ARF) ([place]) 05/04/2020  . Dyspnea on exertion 05/04/2020  . Macrocytic anemia 05/04/2020  . Transaminitis 05/04/2020  . Hyperbilirubinemia 05/04/2020  . Acne rosacea 03/20/2020  . Adenomatous polyp of colon 03/20/2020  . Body mass index (BMI) 31.0-31.9, adult 03/20/2020  . 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 Erectile dysfunction 03/20/2020  . Esophageal stricture 03/20/2020  . Essential hypertension 03/20/2020  . Essential tremor 03/20/2020  . Gait abnormality 03/20/2020  . Gastro-esophageal reflux disease without esophagitis 03/20/2020  . 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 Hypercholesterolemia 03/20/2020  . Irritable bowel syndrome 03/20/2020  . Male hypogonadism 03/20/2020  . Osteoarthritis of knee 03/20/2020  . Perennial allergic rhinitis 03/20/2020  . Vasomotor rhinitis 03/20/2020   Past Medical History:  Diagnosis Date  . Arthritis   . Hyperlipemia   . Hypertension   . Macular degeneration, wet ([place])   . SOB (shortness of breath)     Family History  Problem Relation  Age of Onset  . Colon cancer Mother 4  . Ovarian cancer Mother   . Prostate cancer Father   . Healthy Brother   . Breast cancer Daughter        x1 oldest daughter  . Hashimoto's thyroiditis Daughter        youngest daughter    Past Surgical History:  Procedure Laterality Date  . ANKLE SURGERY    . CATARACT EXTRACTION, BILATERAL    . IR PARACENTESIS  05/09/2020  . TONSILLECTOMY     Social History   Occupational History  . Occupation: retired  Tobacco Use  . Smoking status: Former Smoker    Quit date: 1974    Years  since quitting: 48.1  . Smokeless tobacco: Never Used  Vaping Use  . Vaping Use: Never used  Substance and Sexual Activity  . Alcohol use: Yes    Alcohol/week: 3.0 standard drinks    Types: 3 Glasses of wine per week    Comment: 2-3 drinks/night  . Drug use: Never  . Sexual activity: Not on file

## 2020-05-21 NOTE — Progress Notes (Unsigned)
Cardiology Office Note:    Date:  05/24/2020   ID:  Randy Wood, DOB 1940-12-14, MRN [id number]  PCP:  Randy Blase, MD   [place]  Cardiologist:  No primary care provider on file.  Advanced Practice Provider:  No care team member to display Electrophysiologist:  None    Referring MD: Randy Blase, MD    History of Present Illness:    Randy Wood is a 80 y.o. male with a hx of HTN, HLD, alcohol abuse and recent hospitalization for decompensated alcoholic cirrhosis who was referred by Dr. Tyrell Wood for further evaluation of LE edema and dyspnea.  Patient recently admitted to [facility] from 05/03/20-05/11/20 for worsening SOB, anasarca and leg pain/wekness found to have decompensated liver disease with significant ascites and renal failure. During admission, patient underwent paracentesis with 5.2L of fluid removed. Peritoneal fluid with ESBL E.Coli for which he was started on meropenem. Was diuresed with IV lasix later transitioned to PO. Renal function improved with fluid removal and diuresis. TTE with normal LVEF, G1DD. LE doppler negative for LE DVT.  Patient states that he feels overall well since being discharged. Has been working with PT and has a home health aide that has been wonderful and very helpful. LE edema significantly improved. No shortness breath, orthopnea, PND. No lightheadedness and dizziness. Was previously on propranolol for tremors which was stopped during his hospitalization. No current tremors Blood pressures at home are well controlled. Cholesterol was previously well controlled with LDL 41 (2020). Has been adhering to a low Na diet.   Past Medical History:  Diagnosis Date  . AKI (acute kidney injury) ([place])   . Alcohol abuse   . Arthritis   . Diarrhea   . Edema leg   . GERD (gastroesophageal reflux disease)   . Hyperbilirubinemia   . Hyperlipemia   . Hypertension   . Hypokalemia   . Liver failure ([place])   . Macular degeneration,  wet ([place])   . Microcytic anemia   . Osteoarthritis   . SOB (shortness of breath)   . Weight gain     Past Surgical History:  Procedure Laterality Date  . ANKLE SURGERY    . CATARACT EXTRACTION, BILATERAL    . IR PARACENTESIS  05/09/2020  . TONSILLECTOMY      Current Medications: Current Meds  Medication Sig  . brimonidine (ALPHAGAN) 0.2 % ophthalmic solution Place 1 drop into the left eye in the morning and at bedtime.  . dorzolamide-timolol (COSOPT) 22.3-6.8 MG/ML ophthalmic solution Place 1 drop into the left eye 2 (two) times daily.  Marland Kitchen EPINEPHrine 0.3 mg/0.3 mL IJ SOAJ injection Inject 0.3 mg into the muscle as needed for anaphylaxis.  . fluticasone (FLONASE) 50 MCG/ACT nasal spray Place 1 spray into both nostrils daily.  . folic acid (FOLVITE) 1 MG tablet Take 1 tablet (1 mg total) by mouth daily.  . furosemide (LASIX) 40 MG tablet Take 1 tablet (40 mg total) by mouth daily.  Marland Kitchen GLUCOSAMINE-CHONDROITIN PO Take 1 capsule by mouth daily.  Marland Kitchen lactulose (CHRONULAC) 10 GM/15ML solution Take 15 mLs (10 g total) by mouth daily as needed for mild constipation.  . Multiple Vitamins-Minerals (ONE-A-DAY MENS 50+) TABS Take 1 tablet by mouth daily.  . Multiple Vitamins-Minerals (PRESERVISION AREDS 2 PO) Take 1 tablet by mouth daily.  . potassium chloride SA (KLOR-CON) 10 MEQ tablet Take 1 tablet (10 mEq total) by mouth daily.  Marland Kitchen spironolactone (ALDACTONE) 25 MG tablet Take 1 tablet (25  mg total) by mouth daily.  . traMADol (ULTRAM) 50 MG tablet Take 25 mg by mouth every 12 (twelve) hours as needed for moderate pain.     Allergies:   Lisinopril   Social History   Socioeconomic History  . Marital status: Widowed    Spouse name: Not on file  . Number of children: 2  . Years of education: Not on file  . Highest education level: Bachelor's degree (e.g., BA, AB, BS)  Occupational History  . Occupation: retired  Tobacco Use  . Smoking status: Former Smoker    Quit date: 1974    Years  since quitting: 48.1  . Smokeless tobacco: Never Used  Vaping Use  . Vaping Use: Never used  Substance and Sexual Activity  . Alcohol use: Yes    Alcohol/week: 3.0 standard drinks    Types: 3 Glasses of wine per week    Comment: 2-3 drinks/night  . Drug use: Never  . Sexual activity: Not on file  Other Topics Concern  . Not on file  Social History Narrative   Widowed   2 daughter   Right handed   Retired   Drinks coffee 1 cup a day, NO Tea NO soda   Social Determinants of Radio broadcast assistant Strain: Not on file  Food Insecurity: Not on file  Transportation Needs: Not on file  Physical Activity: Not on file  Stress: Not on file  Social Connections: Not on file     Family History: The patient's family history includes Breast cancer in his daughter; Colon cancer (age of onset: 70) in his mother; Hashimoto's thyroiditis in his daughter; Healthy in his brother; Ovarian cancer in his mother; Prostate cancer in his father.  ROS:   Please see the history of present illness.    Review of Systems  Constitutional: Negative for chills and fever.  HENT: Negative for hearing loss.   Eyes: Negative for blurred vision and redness.  Respiratory: Negative for shortness of breath.   Cardiovascular: Positive for leg swelling. Negative for chest pain, palpitations, orthopnea, claudication and PND.  Gastrointestinal: Negative for abdominal pain, melena, nausea and vomiting.  Genitourinary: Negative for dysuria and hematuria.  Musculoskeletal: Negative for falls.  Neurological: Negative for dizziness and loss of consciousness.  Endo/Heme/Allergies: Negative for polydipsia.  Psychiatric/Behavioral: Positive for substance abuse.    EKGs/Labs/Other Studies Reviewed:    The following studies were reviewed today: TTE May 15, 2020: 1. Left ventricular ejection fraction, by estimation, is 60 to 65%. The  left ventricle has normal function. The left ventricle has no regional  wall motion  abnormalities. Left ventricular diastolic parameters are  consistent with Grade I diastolic  dysfunction (impaired relaxation).  2. Right ventricular systolic function is normal. The right ventricular  size is normal. Tricuspid regurgitation signal is inadequate for assessing  PA pressure.  3. The mitral valve is grossly normal. Trivial mitral valve  regurgitation.  4. The aortic valve is tricuspid. Aortic valve regurgitation is not  visualized. Mild aortic valve sclerosis is present, with no evidence of  aortic valve stenosis.  5. The inferior vena cava is normal in size with greater than 50%  respiratory variability, suggesting right atrial pressure of 3 mmHg.   LE Doppler ultrasound 05/02/20: Summary:  RIGHT:  - There is no evidence of deep vein thrombosis in the lower extremity.  - There is no evidence of superficial venous thrombosis.    - No cystic structure found in the popliteal fossa.    LEFT:  -  There is no evidence of deep vein thrombosis in the lower extremity.  - There is no evidence of superficial venous thrombosis.   Recent Labs: 05/01/2020: TSH 5.56 05/03/2020: B Natriuretic Peptide 237.7 05/05/2020: Magnesium 1.6 05/21/2020: ALT 30; BUN 14; Creat 0.90; Hemoglobin 11.3; Platelets 285; Potassium 3.8; Sodium 141  Recent Lipid Panel No results found for: CHOL, TRIG, HDL, CHOLHDL, VLDL, LDLCALC, LDLDIRECT   Physical Exam:    VS:  BP (!) 150/74   Pulse 94   Ht 5\' 11"  (1.803 m)   Wt 201 lb 6.4 oz (91.4 kg)   SpO2 97%   BMI 28.09 kg/m     Wt Readings from Last 3 Encounters:  05/24/20 201 lb 6.4 oz (91.4 kg)  05/21/20 204 lb 9.6 oz (92.8 kg)  05/11/20 213 lb 6.5 oz (96.8 kg)     GEN:  Well nourished, well developed in no acute distress HEENT: Normal NECK: No JVD; No carotid bruits CARDIAC: RRR, no murmurs, rubs, gallops RESPIRATORY:  Clear to auscultation without rales, wheezing or rhonchi  ABDOMEN: Soft, non-tender, non-distended MUSCULOSKELETAL:   Trace edema; No deformity  SKIN: Warm and dry NEUROLOGIC:  Alert and oriented x 3 PSYCHIATRIC:  Normal affect   ASSESSMENT:    1. Hypercholesterolemia   2. Alcoholic cirrhosis of liver with ascites ([place])   3. Dyspnea on exertion   4. Lower extremity edema    PLAN:    In order of problems listed above:  #Alcoholic Cirrhosis: #DOE #LE Edema: Patient with recent hospitalization for anasarca, LE edema and dyspnea found to have decompensated liver cirrhosis secondary to alcohol. Was successfully diuresed with IV lasix and underwent paracentesis with significant improvement. TTE with normal EF, G1DD, no significant valvular disease. Suspect symptoms were driven by alcoholic cirrhosis which have now significantly improved.  -Continue management of cirrhosis per hepatology -Continue lasix 40mg  daily and spironolactone 25mg  daily -LE dopplers negative; TTE with normal BiV function; no significant valvular disease -Continue low Na diet -Monitoring daily weights -Continue alcohol cessation  #HTN: Well controlled on home BP cuff. Not on medications currently. -Continue to monitor at home; not requiring medications at this time  #HLD: Previously on statin but was stopped by primary. Last LDL 41 in 2020. -Check lipid panel     Medication Adjustments/Labs and Tests Ordered: Current medicines are reviewed at length with the patient today.  Concerns regarding medicines are outlined above.  Orders Placed This Encounter  Procedures  . Lipid panel   No orders of the defined types were placed in this encounter.   Patient Instructions  Medication Instructions:  Your physician recommends that you continue on your current medications as directed. Please refer to the Current Medication list given to you today.  *If you need a refill on your cardiac medications before your next appointment, please call your pharmacy*   Lab Work: Come in any day next week, at your convenience: Our lab  opens at 7:30.. make sure to come fasting, nothing to eat or drink after midnight the night before:  LIPID  If you have labs (blood work) drawn today and your tests are completely normal, you will receive your results only by: Marland Kitchen MyChart Message (if you have MyChart) OR . A paper copy in the mail If you have any lab test that is abnormal or we need to change your treatment, we will call you to review the results.   Testing/Procedures: None ordered   Follow-Up: At [facility], you and your health needs are  our priority.  As part of our continuing mission to provide you with exceptional heart care, we have created designated Provider Care Teams.  These Care Teams include your primary Cardiologist (physician) and Advanced Practice Providers (APPs -  Physician Assistants and Nurse Practitioners) who all work together to provide you with the care you need, when you need it.  We recommend signing up for the patient portal called "MyChart".  Sign up information is provided on this After Visit Summary.  MyChart is used to connect with patients for Virtual Visits (Telemedicine).  Patients are able to view lab/test results, encounter notes, upcoming appointments, etc.  Non-urgent messages can be sent to your provider as well.   To learn more about what you can do with MyChart, go to [email or web address].    Your next appointment:   6 month(s)  The format for your next appointment:   In Person  Provider:   Gwyndolyn Kaufman, MD   Other Instructions      Signed, Freada Bergeron, MD  05/24/2020 4:34 PM    [place]

## 2020-05-22 ENCOUNTER — Telehealth: Payer: Self-pay | Admitting: Family Medicine

## 2020-05-22 LAB — HEPATIC FUNCTION PANEL
AG Ratio: 1.2 (calc) (ref 1.0–2.5)
ALT: 30 U/L (ref 9–46)
AST: 44 U/L — ABNORMAL HIGH (ref 10–35)
Albumin: 3.3 g/dL — ABNORMAL LOW (ref 3.6–5.1)
Alkaline phosphatase (APISO): 115 U/L (ref 35–144)
Bilirubin, Direct: 0.6 mg/dL — ABNORMAL HIGH (ref 0.0–0.2)
Globulin: 2.8 g/dL (calc) (ref 1.9–3.7)
Indirect Bilirubin: 0.9 mg/dL (calc) (ref 0.2–1.2)
Total Bilirubin: 1.5 mg/dL — ABNORMAL HIGH (ref 0.2–1.2)
Total Protein: 6.1 g/dL (ref 6.1–8.1)

## 2020-05-22 LAB — CBC WITH DIFFERENTIAL/PLATELET
Absolute Monocytes: 615 cells/uL (ref 200–950)
Basophils Absolute: 53 cells/uL (ref 0–200)
Basophils Relative: 0.7 %
Eosinophils Absolute: 83 cells/uL (ref 15–500)
Eosinophils Relative: 1.1 %
HCT: 33.2 % — ABNORMAL LOW (ref 38.5–50.0)
Hemoglobin: 11.3 g/dL — ABNORMAL LOW (ref 13.2–17.1)
Lymphs Abs: 2325 cells/uL (ref 850–3900)
MCH: 36.3 pg — ABNORMAL HIGH (ref 27.0–33.0)
MCHC: 34 g/dL (ref 32.0–36.0)
MCV: 106.8 fL — ABNORMAL HIGH (ref 80.0–100.0)
MPV: 11.3 fL (ref 7.5–12.5)
Monocytes Relative: 8.2 %
Neutro Abs: 4425 cells/uL (ref 1500–7800)
Neutrophils Relative %: 59 %
Platelets: 285 10*3/uL (ref 140–400)
RBC: 3.11 10*6/uL — ABNORMAL LOW (ref 4.20–5.80)
RDW: 11.7 % (ref 11.0–15.0)
Total Lymphocyte: 31 %
WBC: 7.5 10*3/uL (ref 3.8–10.8)

## 2020-05-22 LAB — BASIC METABOLIC PANEL
BUN: 14 mg/dL (ref 7–25)
CO2: 24 mmol/L (ref 20–32)
Calcium: 8.9 mg/dL (ref 8.6–10.3)
Chloride: 105 mmol/L (ref 98–110)
Creat: 0.9 mg/dL (ref 0.70–1.11)
Glucose, Bld: 121 mg/dL — ABNORMAL HIGH (ref 65–99)
Potassium: 3.8 mmol/L (ref 3.5–5.3)
Sodium: 141 mmol/L (ref 135–146)

## 2020-05-22 NOTE — Telephone Encounter (Signed)
Labs show steady improvement.  Recheck in about 3 months.

## 2020-05-23 ENCOUNTER — Other Ambulatory Visit (HOSPITAL_COMMUNITY): Payer: BLUE CROSS/BLUE SHIELD

## 2020-05-23 ENCOUNTER — Encounter (HOSPITAL_COMMUNITY): Payer: BLUE CROSS/BLUE SHIELD

## 2020-05-24 ENCOUNTER — Encounter: Payer: Self-pay | Admitting: Cardiology

## 2020-05-24 ENCOUNTER — Ambulatory Visit (INDEPENDENT_AMBULATORY_CARE_PROVIDER_SITE_OTHER): Payer: Medicare Other | Admitting: Cardiology

## 2020-05-24 ENCOUNTER — Other Ambulatory Visit: Payer: Self-pay

## 2020-05-24 VITALS — BP 150/74 | HR 94 | Ht 71.0 in | Wt 201.4 lb

## 2020-05-24 DIAGNOSIS — K7031 Alcoholic cirrhosis of liver with ascites: Secondary | ICD-10-CM

## 2020-05-24 DIAGNOSIS — R600 Localized edema: Secondary | ICD-10-CM

## 2020-05-24 DIAGNOSIS — E7800 Pure hypercholesterolemia, unspecified: Secondary | ICD-10-CM

## 2020-05-24 DIAGNOSIS — R0600 Dyspnea, unspecified: Secondary | ICD-10-CM | POA: Diagnosis not present

## 2020-05-24 DIAGNOSIS — R0609 Other forms of dyspnea: Secondary | ICD-10-CM

## 2020-05-24 NOTE — Patient Instructions (Addendum)
Medication Instructions:  Your physician recommends that you continue on your current medications as directed. Please refer to the Current Medication list given to you today.  *If you need a refill on your cardiac medications before your next appointment, please call your pharmacy*   Lab Work: Come in any day next week, at your convenience: Our lab opens at 7:30.. make sure to come fasting, nothing to eat or drink after midnight the night before:  LIPID  If you have labs (blood work) drawn today and your tests are completely normal, you will receive your results only by: Marland Kitchen MyChart Message (if you have MyChart) OR . A paper copy in the mail If you have any lab test that is abnormal or we need to change your treatment, we will call you to review the results.   Testing/Procedures: None ordered   Follow-Up: At [facility], you and your health needs are our priority.  As part of our continuing mission to provide you with exceptional heart care, we have created designated Provider Care Teams.  These Care Teams include your primary Cardiologist (physician) and Advanced Practice Providers (APPs -  Physician Assistants and Nurse Practitioners) who all work together to provide you with the care you need, when you need it.  We recommend signing up for the patient portal called "MyChart".  Sign up information is provided on this After Visit Summary.  MyChart is used to connect with patients for Virtual Visits (Telemedicine).  Patients are able to view lab/test results, encounter notes, upcoming appointments, etc.  Non-urgent messages can be sent to your provider as well.   To learn more about what you can do with MyChart, go to [email or web address].    Your next appointment:   6 month(s)  The format for your next appointment:   In Person  Provider:   Gwyndolyn Kaufman, MD   Other Instructions

## 2020-05-28 ENCOUNTER — Other Ambulatory Visit: Payer: Medicare Other

## 2020-05-28 ENCOUNTER — Other Ambulatory Visit: Payer: Self-pay

## 2020-05-28 DIAGNOSIS — E7800 Pure hypercholesterolemia, unspecified: Secondary | ICD-10-CM | POA: Diagnosis not present

## 2020-05-28 LAB — LIPID PANEL
Chol/HDL Ratio: 6.5 ratio — ABNORMAL HIGH (ref 0.0–5.0)
Cholesterol, Total: 175 mg/dL (ref 100–199)
HDL: 27 mg/dL — ABNORMAL LOW (ref >39)
LDL Chol Calc (NIH): 129 mg/dL — ABNORMAL HIGH (ref 0–99)
Triglycerides: 105 mg/dL (ref 0–149)
VLDL Cholesterol Cal: 19 mg/dL (ref 5–40)

## 2020-05-30 ENCOUNTER — Telehealth: Payer: Self-pay

## 2020-05-30 MED ORDER — EZETIMIBE 10 MG PO TABS: 10.0000 mg | ORAL_TABLET | Freq: Every day | ORAL | 3 refills | Status: DC

## 2020-05-30 NOTE — Telephone Encounter (Signed)
-----   Message from Freada Bergeron, MD sent at 05/30/2020 11:03 AM EST ----- His cholesterol is elevated. Can we start zetia 10mg  daily. We do not want to start a statin for now due to cirrhosis.

## 2020-05-30 NOTE — Telephone Encounter (Signed)
The patient has been notified of the result and verbalized understanding.  All questions (if any) were answered. Antonieta Iba, RN 05/30/2020 11:21 AM  Patient agreeable to start zetia 10 mg daily. Rx has been sent in.

## 2020-06-03 DIAGNOSIS — K7460 Unspecified cirrhosis of liver: Secondary | ICD-10-CM | POA: Diagnosis not present

## 2020-06-03 DIAGNOSIS — K5900 Constipation, unspecified: Secondary | ICD-10-CM | POA: Diagnosis not present

## 2020-06-03 DIAGNOSIS — D649 Anemia, unspecified: Secondary | ICD-10-CM | POA: Diagnosis not present

## 2020-06-24 ENCOUNTER — Encounter (INDEPENDENT_AMBULATORY_CARE_PROVIDER_SITE_OTHER): Payer: Medicare Other | Admitting: Ophthalmology

## 2020-06-24 ENCOUNTER — Other Ambulatory Visit: Payer: Self-pay

## 2020-06-24 DIAGNOSIS — H35033 Hypertensive retinopathy, bilateral: Secondary | ICD-10-CM | POA: Diagnosis not present

## 2020-06-24 DIAGNOSIS — I10 Essential (primary) hypertension: Secondary | ICD-10-CM | POA: Diagnosis not present

## 2020-06-24 DIAGNOSIS — H43813 Vitreous degeneration, bilateral: Secondary | ICD-10-CM

## 2020-06-24 DIAGNOSIS — H353211 Exudative age-related macular degeneration, right eye, with active choroidal neovascularization: Secondary | ICD-10-CM

## 2020-06-24 DIAGNOSIS — H353122 Nonexudative age-related macular degeneration, left eye, intermediate dry stage: Secondary | ICD-10-CM

## 2020-07-06 DIAGNOSIS — Z23 Encounter for immunization: Secondary | ICD-10-CM | POA: Diagnosis not present

## 2020-07-18 DIAGNOSIS — Z8619 Personal history of other infectious and parasitic diseases: Secondary | ICD-10-CM | POA: Diagnosis not present

## 2020-07-18 DIAGNOSIS — K7460 Unspecified cirrhosis of liver: Secondary | ICD-10-CM | POA: Diagnosis not present

## 2020-07-29 ENCOUNTER — Other Ambulatory Visit: Payer: Self-pay

## 2020-07-29 ENCOUNTER — Encounter (INDEPENDENT_AMBULATORY_CARE_PROVIDER_SITE_OTHER): Payer: Medicare Other | Admitting: Ophthalmology

## 2020-07-29 DIAGNOSIS — H35033 Hypertensive retinopathy, bilateral: Secondary | ICD-10-CM

## 2020-07-29 DIAGNOSIS — H353211 Exudative age-related macular degeneration, right eye, with active choroidal neovascularization: Secondary | ICD-10-CM

## 2020-07-29 DIAGNOSIS — H353122 Nonexudative age-related macular degeneration, left eye, intermediate dry stage: Secondary | ICD-10-CM | POA: Diagnosis not present

## 2020-07-29 DIAGNOSIS — H43813 Vitreous degeneration, bilateral: Secondary | ICD-10-CM | POA: Diagnosis not present

## 2020-07-29 DIAGNOSIS — I10 Essential (primary) hypertension: Secondary | ICD-10-CM | POA: Diagnosis not present

## 2020-08-05 DIAGNOSIS — K7460 Unspecified cirrhosis of liver: Secondary | ICD-10-CM | POA: Diagnosis not present

## 2020-08-23 ENCOUNTER — Encounter: Payer: Self-pay | Admitting: Family Medicine

## 2020-08-23 ENCOUNTER — Telehealth: Payer: Self-pay

## 2020-08-23 MED ORDER — NIRMATRELVIR/RITONAVIR (PAXLOVID)TABLET: 1.0000 | ORAL_TABLET | Freq: Two times a day (BID) | ORAL | 0 refills | Status: AC

## 2020-08-23 NOTE — Telephone Encounter (Signed)
Patient called he stated he tested positive for Covid he is requesting a call back regarding treatment  call [phone]

## 2020-08-23 NOTE — Telephone Encounter (Signed)
Please advise 

## 2020-08-23 NOTE — Telephone Encounter (Signed)
Patient has had head congestion and a low-grade fever for the past few days.  He tested positive for COVID.  Denies any shortness of breath, chest pain, lower extremity edema.  His daughter was also recently diagnosed with COVID and did well with paxlovid and he would like to try that.  Will notify me if gets worse, or short of breath, or oxygen levels drop.

## 2020-08-23 NOTE — Addendum Note (Signed)
Addended by: Hortencia Pilar on: 08/23/2020 09:34 AM   Modules accepted: Orders

## 2020-09-02 ENCOUNTER — Encounter (INDEPENDENT_AMBULATORY_CARE_PROVIDER_SITE_OTHER): Payer: Medicare Other | Admitting: Ophthalmology

## 2020-09-02 ENCOUNTER — Other Ambulatory Visit: Payer: Self-pay

## 2020-09-02 DIAGNOSIS — I10 Essential (primary) hypertension: Secondary | ICD-10-CM | POA: Diagnosis not present

## 2020-09-02 DIAGNOSIS — H353122 Nonexudative age-related macular degeneration, left eye, intermediate dry stage: Secondary | ICD-10-CM | POA: Diagnosis not present

## 2020-09-02 DIAGNOSIS — H35033 Hypertensive retinopathy, bilateral: Secondary | ICD-10-CM | POA: Diagnosis not present

## 2020-09-02 DIAGNOSIS — H43813 Vitreous degeneration, bilateral: Secondary | ICD-10-CM | POA: Diagnosis not present

## 2020-09-02 DIAGNOSIS — H353211 Exudative age-related macular degeneration, right eye, with active choroidal neovascularization: Secondary | ICD-10-CM | POA: Diagnosis not present

## 2020-09-03 DIAGNOSIS — H401422 Capsular glaucoma with pseudoexfoliation of lens, left eye, moderate stage: Secondary | ICD-10-CM | POA: Diagnosis not present

## 2020-09-06 ENCOUNTER — Encounter: Payer: Self-pay | Admitting: Family Medicine

## 2020-09-06 MED ORDER — FUROSEMIDE 40 MG PO TABS: 40.0000 mg | ORAL_TABLET | Freq: Every day | ORAL | 3 refills | Status: DC

## 2020-09-17 ENCOUNTER — Encounter: Payer: Self-pay | Admitting: Family Medicine

## 2020-09-17 ENCOUNTER — Ambulatory Visit (INDEPENDENT_AMBULATORY_CARE_PROVIDER_SITE_OTHER): Payer: Medicare Other | Admitting: Family Medicine

## 2020-09-17 ENCOUNTER — Other Ambulatory Visit: Payer: Self-pay

## 2020-09-17 VITALS — BP 120/67 | HR 87 | Ht 71.0 in | Wt 180.0 lb

## 2020-09-17 DIAGNOSIS — N179 Acute kidney failure, unspecified: Secondary | ICD-10-CM

## 2020-09-17 DIAGNOSIS — M1711 Unilateral primary osteoarthritis, right knee: Secondary | ICD-10-CM

## 2020-09-17 DIAGNOSIS — K219 Gastro-esophageal reflux disease without esophagitis: Secondary | ICD-10-CM | POA: Diagnosis not present

## 2020-09-17 DIAGNOSIS — K7031 Alcoholic cirrhosis of liver with ascites: Secondary | ICD-10-CM

## 2020-09-17 LAB — HEPATIC FUNCTION PANEL
AG Ratio: 1.3 (calc) (ref 1.0–2.5)
ALT: 9 U/L (ref 9–46)
AST: 18 U/L (ref 10–35)
Albumin: 3.8 g/dL (ref 3.6–5.1)
Alkaline phosphatase (APISO): 56 U/L (ref 35–144)
Bilirubin, Direct: 0.1 mg/dL (ref 0.0–0.2)
Globulin: 2.9 g/dL (calc) (ref 1.9–3.7)
Indirect Bilirubin: 0.4 mg/dL (calc) (ref 0.2–1.2)
Total Bilirubin: 0.5 mg/dL (ref 0.2–1.2)
Total Protein: 6.7 g/dL (ref 6.1–8.1)

## 2020-09-17 LAB — BASIC METABOLIC PANEL
BUN: 16 mg/dL (ref 7–25)
CO2: 29 mmol/L (ref 20–32)
Calcium: 9.3 mg/dL (ref 8.6–10.3)
Chloride: 100 mmol/L (ref 98–110)
Creat: 0.99 mg/dL (ref 0.70–1.11)
Glucose, Bld: 141 mg/dL — ABNORMAL HIGH (ref 65–99)
Potassium: 3.6 mmol/L (ref 3.5–5.3)
Sodium: 138 mmol/L (ref 135–146)

## 2020-09-17 LAB — CBC WITH DIFFERENTIAL/PLATELET
Absolute Monocytes: 462 cells/uL (ref 200–950)
Basophils Absolute: 39 cells/uL (ref 0–200)
Basophils Relative: 0.6 %
Eosinophils Absolute: 117 cells/uL (ref 15–500)
Eosinophils Relative: 1.8 %
HCT: 38.2 % — ABNORMAL LOW (ref 38.5–50.0)
Hemoglobin: 12.4 g/dL — ABNORMAL LOW (ref 13.2–17.1)
Lymphs Abs: 1788 cells/uL (ref 850–3900)
MCH: 30.5 pg (ref 27.0–33.0)
MCHC: 32.5 g/dL (ref 32.0–36.0)
MCV: 94.1 fL (ref 80.0–100.0)
MPV: 10.3 fL (ref 7.5–12.5)
Monocytes Relative: 7.1 %
Neutro Abs: 4095 cells/uL (ref 1500–7800)
Neutrophils Relative %: 63 %
Platelets: 211 10*3/uL (ref 140–400)
RBC: 4.06 10*6/uL — ABNORMAL LOW (ref 4.20–5.80)
RDW: 11.8 % (ref 11.0–15.0)
Total Lymphocyte: 27.5 %
WBC: 6.5 10*3/uL (ref 3.8–10.8)

## 2020-09-17 NOTE — Progress Notes (Signed)
Office Visit Note   Patient: Randy Wood           Date of Birth: [birth year]           MRN: [id number] Visit Date: 09/17/2020 Requested by: Eunice Blase, MD [place],  [place] [id number] PCP: Eunice Blase, MD  Subjective: Chief Complaint  Patient presents with   Other    Follow up on medications -- has other general questions    HPI: He is here for monitoring of medical conditions.  He remains alcohol free.  We are due to monitor liver function.  He is taking spironolactone along with Lasix.  He is wearing compression stockings and his edema in his legs has been doing very well.  At [facility], his most recent creatinine had bumped up slightly compared to the one in April which was normal.  He needs to have his renal function rechecked as well.  His GERD has been acting up again and he is now back on omeprazole.  20 mg is not quite enough, so he plans to start taking 40 mg soon.  He has been having pain from arthritis in his right index finger DIP joint and his left thumb IP joint.  He wonders what he can use for that.  His right foot and ankle have been bothering him but his pain is better when using Voltaren gel.  His right knee is hurting again and he requests another injection.  Previous injection helped quite a bit.                ROS:   All other systems were reviewed and are negative.  Objective: Vital Signs: BP 120/67 (BP Location: Right Arm, Patient Position: Sitting, Cuff Size: Normal)   Pulse 87   Ht 5\' 11"  (1.803 m)   Wt 180 lb (81.6 kg)   BMI 25.10 kg/m   Physical Exam:  General:  Alert and oriented, in no acute distress. Pulm:  Breathing unlabored. Psy:  Normal mood, congruent affect.  CV: Regular rate and rhythm without murmurs, rubs, or gallops.  No peripheral edema.  2+ radial and posterior tibial pulses. Lungs: Clear to auscultation throughout with no wheezing or areas of consolidation. Right knee: No effusion.  He is tender  on the medial and lateral joint lines. Hands: He has degenerative changes in his right index DIP joint.  No synovitis.    Imaging: No results found.  Assessment & Plan: History of ascites due to alcohol use - Doing well, continue with Lasix and lactulose as well as spironolactone.  Recheck labs today.   Renal insufficiency -Recheck labs today.  3.  Right knee osteoarthritis -Steroid injection today.  4.  Finger arthritis -Voltaren gel as needed.  5.  GERD -Continue with omeprazole.  Monitor kidney function periodically.     Procedures: After sterile prep with Betadine, injected 3 cc 1% lidocaine without epinephrine and 6 mg betamethasone from lateral midpatellar approach into the right knee.       PMFS History: Patient Active Problem List   Diagnosis Date Noted   Infection due to ESBL-producing Escherichia coli 05/08/2020   Cirrhosis ([place]) 05/08/2020   SBP (spontaneous bacterial peritonitis) ([place])    Anasarca 05/04/2020   Liver failure ([place]) 05/04/2020   Acute renal failure (ARF) ([place]) 05/04/2020   Dyspnea on exertion 05/04/2020   Macrocytic anemia 05/04/2020   Transaminitis 05/04/2020   Hyperbilirubinemia 05/04/2020   Acne rosacea 03/20/2020   Adenomatous polyp of colon 03/20/2020  Body mass index (BMI) 31.0-31.9, adult 03/20/2020   Degenerative joint disease of ankle 03/20/2020   Erectile dysfunction 03/20/2020   Esophageal stricture 03/20/2020   Essential hypertension 03/20/2020   Essential tremor 03/20/2020   Gait abnormality 03/20/2020   Gastro-esophageal reflux disease without esophagitis 03/20/2020   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   Hypercholesterolemia 03/20/2020   Irritable bowel syndrome 03/20/2020   Male hypogonadism 03/20/2020   Osteoarthritis of knee 03/20/2020   Perennial allergic rhinitis 03/20/2020   Vasomotor rhinitis 03/20/2020   Past Medical History:  Diagnosis Date   AKI (acute kidney injury) ([place])    Alcohol abuse    Arthritis     Diarrhea    Edema leg    GERD (gastroesophageal reflux disease)    Hyperbilirubinemia    Hyperlipemia    Hypertension    Hypokalemia    Liver failure ([facility])    Macular degeneration, wet ([facility])    Microcytic anemia    Osteoarthritis    SOB (shortness of breath)    Weight gain     Family History  Problem Relation Age of Onset   Colon cancer Mother 73   Ovarian cancer Mother    Prostate cancer Father    Healthy Brother    Breast cancer Daughter        x1 oldest daughter   Hashimoto's thyroiditis Daughter        youngest daughter    Past Surgical History:  Procedure Laterality Date   ANKLE SURGERY     CATARACT EXTRACTION, BILATERAL     IR PARACENTESIS  05/09/2020   TONSILLECTOMY     Social History   Occupational History   Occupation: retired  Tobacco Use   Smoking status: Former    Pack years: 0.00    Types: Cigarettes    Quit date: 1974    Years since quitting: 48.5   Smokeless tobacco: Never  Vaping Use   Vaping Use: Never used  Substance and Sexual Activity   Alcohol use: Yes    Alcohol/week: 3.0 standard drinks    Types: 3 Glasses of wine per week    Comment: 2-3 drinks/night   Drug use: Never   Sexual activity: Not on file

## 2020-09-18 ENCOUNTER — Ambulatory Visit: Payer: BLUE CROSS/BLUE SHIELD | Admitting: Family Medicine

## 2020-09-19 ENCOUNTER — Telehealth: Payer: Self-pay | Admitting: Family Medicine

## 2020-09-19 NOTE — Telephone Encounter (Signed)
Labs show:  Liver function now normal - this is great!  Remain off alcohol completely.  This should be monitored again in about 6 months.  Blood glucose was elevated.  This was not a fasting blood draw, correct?  Kidney function looks normal.  Anemia is steadily improving.

## 2020-09-20 DIAGNOSIS — H401422 Capsular glaucoma with pseudoexfoliation of lens, left eye, moderate stage: Secondary | ICD-10-CM | POA: Diagnosis not present

## 2020-09-20 DIAGNOSIS — H353133 Nonexudative age-related macular degeneration, bilateral, advanced atrophic without subfoveal involvement: Secondary | ICD-10-CM | POA: Diagnosis not present

## 2020-09-20 DIAGNOSIS — Z961 Presence of intraocular lens: Secondary | ICD-10-CM | POA: Diagnosis not present

## 2020-10-01 DIAGNOSIS — H401422 Capsular glaucoma with pseudoexfoliation of lens, left eye, moderate stage: Secondary | ICD-10-CM | POA: Diagnosis not present

## 2020-10-07 ENCOUNTER — Other Ambulatory Visit: Payer: Self-pay

## 2020-10-07 ENCOUNTER — Encounter (INDEPENDENT_AMBULATORY_CARE_PROVIDER_SITE_OTHER): Payer: Medicare Other | Admitting: Ophthalmology

## 2020-10-07 DIAGNOSIS — H353211 Exudative age-related macular degeneration, right eye, with active choroidal neovascularization: Secondary | ICD-10-CM | POA: Diagnosis not present

## 2020-10-07 DIAGNOSIS — H353122 Nonexudative age-related macular degeneration, left eye, intermediate dry stage: Secondary | ICD-10-CM | POA: Diagnosis not present

## 2020-10-07 DIAGNOSIS — H43813 Vitreous degeneration, bilateral: Secondary | ICD-10-CM | POA: Diagnosis not present

## 2020-10-07 DIAGNOSIS — H35033 Hypertensive retinopathy, bilateral: Secondary | ICD-10-CM

## 2020-10-07 DIAGNOSIS — I10 Essential (primary) hypertension: Secondary | ICD-10-CM | POA: Diagnosis not present

## 2020-10-17 ENCOUNTER — Other Ambulatory Visit: Payer: Self-pay | Admitting: Gastroenterology

## 2020-10-17 DIAGNOSIS — K219 Gastro-esophageal reflux disease without esophagitis: Secondary | ICD-10-CM | POA: Diagnosis not present

## 2020-10-17 DIAGNOSIS — R1310 Dysphagia, unspecified: Secondary | ICD-10-CM | POA: Diagnosis not present

## 2020-10-17 DIAGNOSIS — Z86010 Personal history of colonic polyps: Secondary | ICD-10-CM | POA: Diagnosis not present

## 2020-10-17 DIAGNOSIS — K7460 Unspecified cirrhosis of liver: Secondary | ICD-10-CM | POA: Diagnosis not present

## 2020-11-07 ENCOUNTER — Telehealth: Payer: Self-pay | Admitting: Family Medicine

## 2020-11-07 DIAGNOSIS — R634 Abnormal weight loss: Secondary | ICD-10-CM

## 2020-11-07 DIAGNOSIS — K219 Gastro-esophageal reflux disease without esophagitis: Secondary | ICD-10-CM

## 2020-11-07 NOTE — Telephone Encounter (Signed)
I called the patient: he has been having difficulty with eating for the last 2 weeks - has lost 15 lbs. When he tries to eat solid foot, with the first bite there is much production of a bubbly liquid in his throat/mouth. His brother (a physician) suggested he try taking an H blocker at night to see if it would help. He has tried this for the last few nights and has been able to keep Ensure down. [facility] has prescribed sucralfate tablet to take bid, but the patient does not want to pick this up without talking with Dr. Junius Roads first. Also, the patient has been scheduled for an EGD at [facility] next month, but the patient does not wish to continue seeing this doctor. He would like to be referred elsewhere. The patient is fine with receiving a response through MyChart.

## 2020-11-07 NOTE — Telephone Encounter (Signed)
Pt called stating he is having significant issues with his gerds and [facility] isn't returning his calls. Pt would like a CB to discuss in further detail.   [phone]

## 2020-11-08 DIAGNOSIS — H401423 Capsular glaucoma with pseudoexfoliation of lens, left eye, severe stage: Secondary | ICD-10-CM | POA: Diagnosis not present

## 2020-11-08 NOTE — Telephone Encounter (Signed)
This info sent through MyChart back to the patient.

## 2020-11-11 ENCOUNTER — Encounter (HOSPITAL_COMMUNITY): Payer: Self-pay | Admitting: Gastroenterology

## 2020-11-11 ENCOUNTER — Ambulatory Visit (HOSPITAL_COMMUNITY): Payer: Medicare Other | Admitting: Anesthesiology

## 2020-11-11 ENCOUNTER — Other Ambulatory Visit: Payer: Self-pay

## 2020-11-11 ENCOUNTER — Ambulatory Visit (HOSPITAL_COMMUNITY)
Admission: RE | Admit: 2020-11-11 | Discharge: 2020-11-11 | Disposition: A | Discharge status: Home / Self Care | Payer: Medicare Other | Attending: Gastroenterology | Admitting: Gastroenterology

## 2020-11-11 ENCOUNTER — Encounter (INDEPENDENT_AMBULATORY_CARE_PROVIDER_SITE_OTHER): Payer: Medicare Other | Admitting: Ophthalmology

## 2020-11-11 ENCOUNTER — Encounter (HOSPITAL_COMMUNITY)
Admission: RE | Disposition: A | Discharge status: Home / Self Care | Payer: Self-pay | Source: Home / Self Care | Attending: Gastroenterology

## 2020-11-11 DIAGNOSIS — H35033 Hypertensive retinopathy, bilateral: Secondary | ICD-10-CM | POA: Diagnosis not present

## 2020-11-11 DIAGNOSIS — Z79899 Other long term (current) drug therapy: Secondary | ICD-10-CM | POA: Insufficient documentation

## 2020-11-11 DIAGNOSIS — Z888 Allergy status to other drugs, medicaments and biological substances status: Secondary | ICD-10-CM | POA: Insufficient documentation

## 2020-11-11 DIAGNOSIS — C159 Malignant neoplasm of esophagus, unspecified: Secondary | ICD-10-CM | POA: Diagnosis not present

## 2020-11-11 DIAGNOSIS — T18198A Other foreign object in esophagus causing other injury, initial encounter: Secondary | ICD-10-CM | POA: Insufficient documentation

## 2020-11-11 DIAGNOSIS — I10 Essential (primary) hypertension: Secondary | ICD-10-CM | POA: Diagnosis not present

## 2020-11-11 DIAGNOSIS — K7030 Alcoholic cirrhosis of liver without ascites: Secondary | ICD-10-CM | POA: Diagnosis not present

## 2020-11-11 DIAGNOSIS — K219 Gastro-esophageal reflux disease without esophagitis: Secondary | ICD-10-CM | POA: Diagnosis not present

## 2020-11-11 DIAGNOSIS — K222 Esophageal obstruction: Secondary | ICD-10-CM | POA: Insufficient documentation

## 2020-11-11 DIAGNOSIS — C155 Malignant neoplasm of lower third of esophagus: Secondary | ICD-10-CM | POA: Diagnosis not present

## 2020-11-11 DIAGNOSIS — X58XXXA Exposure to other specified factors, initial encounter: Secondary | ICD-10-CM | POA: Insufficient documentation

## 2020-11-11 DIAGNOSIS — Z87891 Personal history of nicotine dependence: Secondary | ICD-10-CM | POA: Diagnosis not present

## 2020-11-11 DIAGNOSIS — D490 Neoplasm of unspecified behavior of digestive system: Secondary | ICD-10-CM | POA: Diagnosis not present

## 2020-11-11 DIAGNOSIS — R1310 Dysphagia, unspecified: Secondary | ICD-10-CM | POA: Diagnosis not present

## 2020-11-11 DIAGNOSIS — Z86010 Personal history of colonic polyps: Secondary | ICD-10-CM | POA: Diagnosis not present

## 2020-11-11 DIAGNOSIS — H353211 Exudative age-related macular degeneration, right eye, with active choroidal neovascularization: Secondary | ICD-10-CM

## 2020-11-11 DIAGNOSIS — H353122 Nonexudative age-related macular degeneration, left eye, intermediate dry stage: Secondary | ICD-10-CM

## 2020-11-11 DIAGNOSIS — K2270 Barrett's esophagus without dysplasia: Secondary | ICD-10-CM | POA: Diagnosis not present

## 2020-11-11 DIAGNOSIS — D539 Nutritional anemia, unspecified: Secondary | ICD-10-CM | POA: Diagnosis not present

## 2020-11-11 DIAGNOSIS — H43813 Vitreous degeneration, bilateral: Secondary | ICD-10-CM | POA: Diagnosis not present

## 2020-11-11 HISTORY — PX: BIOPSY: SHX5522

## 2020-11-11 HISTORY — PX: ESOPHAGOGASTRODUODENOSCOPY (EGD) WITH PROPOFOL: SHX5813

## 2020-11-11 HISTORY — PX: FOREIGN BODY REMOVAL: SHX962

## 2020-11-11 SURGERY — ESOPHAGOGASTRODUODENOSCOPY (EGD) WITH PROPOFOL
Anesthesia: Monitor Anesthesia Care

## 2020-11-11 MED ORDER — LACTATED RINGERS IV SOLN: INTRAVENOUS | Status: DC

## 2020-11-11 MED ORDER — SODIUM CHLORIDE 0.9 % IV SOLN: INTRAVENOUS | Status: DC

## 2020-11-11 MED ORDER — PROPOFOL 500 MG/50ML IV EMUL
INTRAVENOUS | Status: DC | PRN
  Administered 2020-11-11: 125 ug/kg/min via INTRAVENOUS

## 2020-11-11 MED ORDER — PROPOFOL 10 MG/ML IV BOLUS
INTRAVENOUS | Status: DC | PRN
  Administered 2020-11-11 (×5): 20 mg via INTRAVENOUS

## 2020-11-11 MED ORDER — PROPOFOL 500 MG/50ML IV EMUL
INTRAVENOUS | Status: AC
  Filled 2020-11-11: qty 50

## 2020-11-11 MED ORDER — LIDOCAINE 2% (20 MG/ML) 5 ML SYRINGE
INTRAMUSCULAR | Status: DC | PRN
  Administered 2020-11-11: 100 mg via INTRAVENOUS

## 2020-11-11 MED ORDER — ONDANSETRON HCL 4 MG/2ML IJ SOLN
INTRAMUSCULAR | Status: DC | PRN
  Administered 2020-11-11: 4 mg via INTRAVENOUS

## 2020-11-11 SURGICAL SUPPLY — 15 items

## 2020-11-11 NOTE — Op Note (Signed)
[facility] Patient Name: Randy Wood Procedure Date: 11/11/2020 MRN: [id number] Attending MD: [name] , MD Date of Birth: November 21, 1940 CSN: [id number] Age: 80 Admit Type: Outpatient Procedure:                Upper GI endoscopy Indications:              Dysphagia Providers:                [name], MD, Particia Nearing, RN, Hinton Dyer, Danley Danker, CRNA Referring MD:              Medicines:                Propofol total dose [id number] mg IV Complications:            No immediate complications. Estimated Blood Loss:     Estimated blood loss was minimal. Procedure:                Pre-Anesthesia Assessment:                           - Prior to the procedure, a History and Physical                            was performed, and patient medications and                            allergies were reviewed. The patient's tolerance of                            previous anesthesia was also reviewed. The risks                            and benefits of the procedure and the sedation                            options and risks were discussed with the patient.                            All questions were answered, and informed consent                            was obtained. Prior Anticoagulants: The patient has                            taken no previous anticoagulant or antiplatelet                            agents. ASA Grade Assessment: III - A patient with                            severe systemic disease. After reviewing the risks  and benefits, the patient was deemed in                            satisfactory condition to undergo the procedure.                           After obtaining informed consent, the endoscope was                            passed under direct vision. Throughout the                            procedure, the patient's blood pressure, pulse, and                            oxygen saturations were  monitored continuously. The                            [id number] Olympus endoscope was introduced                            through the mouth, and advanced to the second part                            of duodenum. The upper GI endoscopy was                            accomplished without difficulty. The patient                            tolerated the procedure well. Scope In: Scope Out: Findings:      The larynx was normal.      One ?malignant-appearing, intrinsic moderate (circumferential scarring       or stenosis; an endoscope may pass) stenosis was found. The stenosis was       traversed with some trauma and some self-limited bleeding. Biopsies were       taken with a cold forceps for histology.      Pills were found in the middle third of the esophagus and in the lower       third of the esophagus. Removal was accomplished with a Roth net x2.      A medium-sized, ?polypoid mass difficult to say whether it was       inflammatory varices or a mass because of the bleeding caused by passing       the scope we could not wash and suction it off to get adequate       visualization but no active bleeding was found in the cardia.      The ampulla, duodenal bulb, first portion of the duodenum, second       portion of the duodenum, major papilla and area of the papilla were       normal.      The exam was otherwise without abnormality. Impression:               - Normal larynx.                           -?  Malignant-appearing esophageal stenosis.                            Biopsied.                           - Pills were found in the esophagus. Removal was                            successful.                           - Rule out malignancy,? gastric tumor in the cardia                            difficult to say based on bleeding from passing the                            scope and unable to wash and suction for adequate                            visualization.                            - Normal ampulla, duodenal bulb, first portion of                            the duodenum, second portion of the duodenum, major                            papilla and area of the papilla.                           - The examination was otherwise normal. Moderate Sedation:      Not Applicable - Patient had care per Anesthesia. Recommendation:           - Patient has a contact number available for                            emergencies. The signs and symptoms of potential                            delayed complications were discussed with the                            patient. Return to normal activities tomorrow.                            Written discharge instructions were provided to the                            patient.                           - Soft diet for 1 week.                           -  Continue present medications.                           - Await pathology results.                           - Return to GI clinic PRN.                           - Telephone GI clinic for pathology results in 4                            days.                           - Telephone GI clinic if symptomatic PRN. Procedure Code(s):        --- Professional ---                           [phone], Esophagogastroduodenoscopy, flexible,                            transoral; with removal of foreign body(s)                           43239, Esophagogastroduodenoscopy, flexible,                            transoral; with biopsy, single or multiple Diagnosis Code(s):        --- Professional ---                           K22.2, Esophageal obstruction                           T18.198A, Other foreign object in esophagus causing                            other injury, initial encounter                           D49.0, Neoplasm of unspecified behavior of                            digestive system                           R13.10, Dysphagia, unspecified CPT copyright 2019 American Medical  Association. All rights reserved. The codes documented in this report are preliminary and upon coder review may  be revised to meet current compliance requirements. [name], MD 11/11/2020 9:47:04 AM This report has been signed electronically. Number of Addenda: 0

## 2020-11-11 NOTE — Anesthesia Postprocedure Evaluation (Signed)
Anesthesia Post Note  Patient: Randy Wood  Procedure(s) Performed: ESOPHAGOGASTRODUODENOSCOPY (EGD) WITH PROPOFOL FOREIGN BODY REMOVAL BIOPSY     Patient location during evaluation: PACU Anesthesia Type: MAC Level of consciousness: awake and alert Pain management: pain level controlled Vital Signs Assessment: post-procedure vital signs reviewed and stable Respiratory status: spontaneous breathing, nonlabored ventilation, respiratory function stable and patient connected to nasal cannula oxygen Cardiovascular status: stable and blood pressure returned to baseline Postop Assessment: no apparent nausea or vomiting Anesthetic complications: no   No notable events documented.  Last Vitals:  Vitals:   11/11/20 0940 11/11/20 0950  BP: 125/61 (!) 123/58  Pulse: 74 70  Resp: 20 16  Temp:    SpO2: 98% 96%    Last Pain:  Vitals:   11/11/20 0950  TempSrc:   PainSc: 0-No pain                 Tiajuana Amass

## 2020-11-11 NOTE — Anesthesia Preprocedure Evaluation (Signed)
Anesthesia Evaluation  Patient identified by MRN, date of birth, ID band Patient awake    Reviewed: Allergy & Precautions, NPO status , Patient's Chart, lab work & pertinent test results  Airway Mallampati: II  TM Distance: >3 FB     Dental  (+) Dental Advisory Given   Pulmonary former smoker,    breath sounds clear to auscultation       Cardiovascular hypertension, Pt. on medications  Rhythm:Regular Rate:Normal     Neuro/Psych negative neurological ROS     GI/Hepatic GERD  ,(+) Cirrhosis       ,   Endo/Other  negative endocrine ROS  Renal/GU Renal disease     Musculoskeletal  (+) Arthritis ,   Abdominal   Peds  Hematology  (+) anemia ,   Anesthesia Other Findings   Reproductive/Obstetrics                             Anesthesia Physical Anesthesia Plan  ASA: 3  Anesthesia Plan: MAC   Post-op Pain Management:    Induction:   PONV Risk Score and Plan: 1 and Propofol infusion, Ondansetron and Treatment may vary due to age or medical condition  Airway Management Planned: Natural Airway  Additional Equipment: None  Intra-op Plan:   Post-operative Plan:   Informed Consent: I have reviewed the patients History and Physical, chart, labs and discussed the procedure including the risks, benefits and alternatives for the proposed anesthesia with the patient or authorized representative who has indicated his/her understanding and acceptance.       Plan Discussed with: CRNA  Anesthesia Plan Comments:         Anesthesia Quick Evaluation

## 2020-11-11 NOTE — Discharge Instructions (Addendum)
If liquids are going well have soft solids today and call if question or problems otherwise chew your food well with plenty of liquids eat slowly take 1 pill at a time and will decide how to proceed once biopsies returned YOU HAD AN ENDOSCOPIC PROCEDURE TODAY: Refer to the procedure report and other information in the discharge instructions given to you for any specific questions about what was found during the examination. If this information does not answer your questions, please call [facility] office at [phone] to clarify.   YOU SHOULD EXPECT: Some feelings of bloating in the abdomen. Passage of more gas than usual. Walking can help get rid of the air that was put into your GI tract during the procedure and reduce the bloating. If you had a lower endoscopy (such as a colonoscopy or flexible sigmoidoscopy) you may notice spotting of blood in your stool or on the toilet paper. Some abdominal soreness may be present for a day or two, also.  DIET: Your first meal following the procedure should be a light meal and then it is ok to progress to your normal diet. A half-sandwich or bowl of soup is an example of a good first meal. Heavy or fried foods are harder to digest and may make you feel nauseous or bloated. Drink plenty of fluids but you should avoid alcoholic beverages for 24 hours. If you had a esophageal dilation, please see attached instructions for diet.    ACTIVITY: Your care partner should take you home directly after the procedure. You should plan to take it easy, moving slowly for the rest of the day. You can resume normal activity the day after the procedure however YOU SHOULD NOT DRIVE, use power tools, machinery or perform tasks that involve climbing or major physical exertion for 24 hours (because of the sedation medicines used during the test).   SYMPTOMS TO REPORT IMMEDIATELY: A gastroenterologist can be reached at any hour. Please call [phone]  for any of the following symptoms:   Following lower endoscopy (colonoscopy, flexible sigmoidoscopy) Excessive amounts of blood in the stool  Significant tenderness, worsening of abdominal pains  Swelling of the abdomen that is new, acute  Fever of 100 or higher  Following upper endoscopy (EGD, EUS, ERCP, esophageal dilation) Vomiting of blood or coffee ground material  New, significant abdominal pain  New, significant chest pain or pain under the shoulder blades  Painful or persistently difficult swallowing  New shortness of breath  Black, tarry-looking or red, bloody stools  FOLLOW UP:  If any biopsies were taken you will be contacted by phone or by letter within the next 1-3 weeks. Call [phone]  if you have not heard about the biopsies in 3 weeks.  Please also call with any specific questions about appointments or follow up tests. YOU HAD AN ENDOSCOPIC PROCEDURE TODAY: Refer to the procedure report and other information in the discharge instructions given to you for any specific questions about what was found during the examination. If this information does not answer your questions, please call [facility] office at [phone] to clarify.   YOU SHOULD EXPECT: Some feelings of bloating in the abdomen. Passage of more gas than usual. Walking can help get rid of the air that was put into your GI tract during the procedure and reduce the bloating. If you had a lower endoscopy (such as a colonoscopy or flexible sigmoidoscopy) you may notice spotting of blood in your stool or on the toilet paper. Some abdominal soreness  may be present for a day or two, also.  DIET: Your first meal following the procedure should be a light meal and then it is ok to progress to your normal diet. A half-sandwich or bowl of soup is an example of a good first meal. Heavy or fried foods are harder to digest and may make you feel nauseous or bloated. Drink plenty of fluids but you should avoid alcoholic beverages for 24 hours. If you had a esophageal  dilation, please see attached instructions for diet.    ACTIVITY: Your care partner should take you home directly after the procedure. You should plan to take it easy, moving slowly for the rest of the day. You can resume normal activity the day after the procedure however YOU SHOULD NOT DRIVE, use power tools, machinery or perform tasks that involve climbing or major physical exertion for 24 hours (because of the sedation medicines used during the test).   SYMPTOMS TO REPORT IMMEDIATELY: A gastroenterologist can be reached at any hour. Please call [phone]  for any of the following symptoms:   Following upper endoscopy (EGD, EUS, ERCP, esophageal dilation) Vomiting of blood or coffee ground material  New, significant abdominal pain  New, significant chest pain or pain under the shoulder blades  Painful or persistently difficult swallowing  New shortness of breath  Black, tarry-looking or red, bloody stools  FOLLOW UP:  If any biopsies were taken you will be contacted by phone or by letter within the next 1-3 weeks. Call [phone]  if you have not heard about the biopsies in 3 weeks.  Please also call with any specific questions about appointments or follow up tests.

## 2020-11-11 NOTE — Transfer of Care (Signed)
Immediate Anesthesia Transfer of Care Note  Patient: Randy Wood  Procedure(s) Performed: ESOPHAGOGASTRODUODENOSCOPY (EGD) WITH PROPOFOL FOREIGN BODY REMOVAL BIOPSY  Patient Location: Endoscopy Unit  Anesthesia Type:MAC  Level of Consciousness: awake and alert   Airway & Oxygen Therapy: Patient Spontanous Breathing and Patient connected to face mask oxygen  Post-op Assessment: Report given to RN and Post -op Vital signs reviewed and stable  Post vital signs: Reviewed and stable  Last Vitals:  Vitals Value Taken Time  BP    Temp    Pulse 77 11/11/20 0932  Resp 24 11/11/20 0932  SpO2 100 % 11/11/20 0932  Vitals shown include unvalidated device data.  Last Pain:  Vitals:   11/11/20 0813  TempSrc: Oral  PainSc: 0-No pain         Complications: No notable events documented.

## 2020-11-11 NOTE — Progress Notes (Signed)
Elyse Jarvis Horwitz 8:51 AM  Subjective: Patient seen and examined and discussed with our PA and Dr. Alessandra Bevels in 4 months he has had increasing dysphagia to solids and his ascites has not been a problem and also had increased reflux and has no other complaint  Objective: Vital signs stable afebrile no acute distress exam please see preassessment evaluation  Assessment: Dysphagia and increased reflux patient with cirrhosis  Plan: Okay to proceed with endoscopy and possible dilation with anesthesia assistance  [facility]  office [phone] After 5PM or if no answer call [phone]

## 2020-11-12 ENCOUNTER — Encounter (HOSPITAL_COMMUNITY): Payer: Self-pay | Admitting: Gastroenterology

## 2020-11-13 ENCOUNTER — Other Ambulatory Visit: Payer: Self-pay | Admitting: Gastroenterology

## 2020-11-13 DIAGNOSIS — C159 Malignant neoplasm of esophagus, unspecified: Secondary | ICD-10-CM

## 2020-11-14 ENCOUNTER — Telehealth: Payer: Self-pay | Admitting: Family Medicine

## 2020-11-14 ENCOUNTER — Ambulatory Visit
Admission: RE | Admit: 2020-11-14 | Discharge: 2020-11-14 | Disposition: A | Discharge status: Home / Self Care | Payer: Medicare Other | Source: Ambulatory Visit | Attending: Gastroenterology | Admitting: Gastroenterology

## 2020-11-14 ENCOUNTER — Telehealth: Payer: Self-pay

## 2020-11-14 ENCOUNTER — Other Ambulatory Visit: Payer: Self-pay

## 2020-11-14 DIAGNOSIS — C159 Malignant neoplasm of esophagus, unspecified: Secondary | ICD-10-CM | POA: Diagnosis not present

## 2020-11-14 DIAGNOSIS — M4856XA Collapsed vertebra, not elsewhere classified, lumbar region, initial encounter for fracture: Secondary | ICD-10-CM | POA: Diagnosis not present

## 2020-11-14 DIAGNOSIS — K6389 Other specified diseases of intestine: Secondary | ICD-10-CM | POA: Diagnosis not present

## 2020-11-14 DIAGNOSIS — I2510 Atherosclerotic heart disease of native coronary artery without angina pectoris: Secondary | ICD-10-CM | POA: Diagnosis not present

## 2020-11-14 DIAGNOSIS — C160 Malignant neoplasm of cardia: Secondary | ICD-10-CM | POA: Diagnosis not present

## 2020-11-14 DIAGNOSIS — M47814 Spondylosis without myelopathy or radiculopathy, thoracic region: Secondary | ICD-10-CM | POA: Diagnosis not present

## 2020-11-14 DIAGNOSIS — J984 Other disorders of lung: Secondary | ICD-10-CM | POA: Diagnosis not present

## 2020-11-14 DIAGNOSIS — R1310 Dysphagia, unspecified: Secondary | ICD-10-CM | POA: Diagnosis not present

## 2020-11-14 MED ORDER — IOPAMIDOL (ISOVUE-300) INJECTION 61%
100.0000 mL | Freq: Once | INTRAVENOUS | Status: AC | PRN
  Administered 2020-11-14: 100 mL via INTRAVENOUS

## 2020-11-14 NOTE — Telephone Encounter (Signed)
I spoke to the patient this afternoon.  He was recently diagnosed with esophageal cancer after having difficulty swallowing for a month and progressive weight loss.  CT scan today revealed a burst fracture of the L5 vertebra.  He is having back pain but not severe.  He is not having any radicular symptoms and is not having any bowel or bladder dysfunction.  He is scheduled to meet with oncology in the near future.  I reviewed his CT images.  The L5 vertebra was normal in appearance in February but has a burst fracture now.  We will arrange for a lumbar corset brace for support.  I will discuss his image findings with Dr. Inda Merlin for additional recommendations.  He plans to see his new PCP, Dr. [name], in September.

## 2020-11-14 NOTE — Telephone Encounter (Signed)
Working on getting information to send to [employer] with [employer] supply. Waiting on the order to be signed, also.

## 2020-11-14 NOTE — Telephone Encounter (Signed)
[facility] called, stated patient had a CT scan ordered by Dr Watt Climes that had some findings Dr Junius Roads needed to be made aware of. Please review CT report per their request.

## 2020-11-15 ENCOUNTER — Encounter: Payer: Self-pay | Admitting: Family Medicine

## 2020-11-15 NOTE — Telephone Encounter (Signed)
All the information has been emailed to [employer].

## 2020-11-19 ENCOUNTER — Telehealth: Payer: Self-pay | Admitting: Family Medicine

## 2020-11-19 NOTE — Telephone Encounter (Signed)
Subjective: Patient underwent CT scan of the chest, abdomen and pelvis on August 18.  The CT scan shows a a burst fracture at L5.  No surgery indicated after discussion with our spine surgeon.  Objective: Examination not done, this was a phone call.  Impression: L5 burst fracture  Plan: Recommended LSO back brace as well as oncology evaluation and treatment.

## 2020-11-24 NOTE — Progress Notes (Addendum)
[facility]   Telephone:([phone] Fax:([phone]) [place] Note   Patient Care Team: Eunice Blase, MD as PCP - General (Family Medicine) [name], MD as Consulting Physician (Gastroenterology) [name], MD as Consulting Physician (Hematology) [name], NP as Nurse Practitioner (Nurse Practitioner) [name], RN as Registered Nurse 11/25/2020  CHIEF COMPLAINTS/PURPOSE OF CONSULTATION:  Adenocarcinoma of distal esophagus/GE junction, referred by [facility] GI Dr. Watt Climes  Oncology History  Primary adenocarcinoma of distal third of esophagus ([place])  11/11/2020 Procedure   EGD by Dr. Watt Climes - Normal larynx. -? Malignant-appearing esophageal stenosis. Biopsied. - Pills were found in the esophagus. Removal was successful. - Rule out malignancy,? gastric tumor in the cardia difficult to say based on bleeding from passing the scope and unable to wash and suction for adequate visualization. - Normal ampulla, duodenal bulb, first portion of the duodenum, second portion of the duodenum, major papilla and area of the papilla. - The examination was otherwise normal.   11/11/2020 Initial Biopsy   FINAL MICROSCOPIC DIAGNOSIS:  A. ESOPHAGUS, DISTAL, BIOPSY:  -  Poorly differentiated adenocarcinoma  -  See comment    11/11/2020 Initial Diagnosis   Primary adenocarcinoma of distal third of esophagus ([place])   11/14/2020 Imaging   CT CAPIMPRESSION: 1. Aggressive appearing new tumor of the gastroesophageal junction confluent with the irregular collection of right gastric lymph nodes, and with new associated metastatic retroperitoneal adenopathy. Small paraesophageal lymph nodes in the thorax are increased in size from prior exam and could also be involved, and there is a nonspecific 1.0 cm lesion inferiorly in the right hepatic lobe which could potentially be metastatic. 2. New subacute burst (AO type A4) fracture of L5, with mild posterior bony  retropulsion and with a dominant coronally oriented fracture plane, and about 50% loss of height. Correlate with interval trauma. 3. Slight thickening along the left paracolic gutter and trace pelvic ascites, although the ascites is markedly reduced compared to 05/04/2020. 4. Other imaging findings of potential clinical significance: Aortic Atherosclerosis (ICD10-I70.0). Coronary atherosclerosis. Old compression fracture at L2.      HISTORY OF PRESENTING ILLNESS:  SHARONE ALMOND 80 y.o. male with past medical history of HTN, HL, alcohol-related cirrhosis with ascites, chronic anemia Hgb 10-12, RLE arthritis, macular degeneration on q5 week Avastin injections, GERD, and esophageal stricture s/p dilation in 2007/05/22 is here because of newly diagnosed esophageal cancer.  He developed worsening GERD and dysphagia with pills and solids, underwent EGD 11/11/2020 by Dr. Watt Climes showing malignant appearing moderate stenosis and a bleeding gastric cardia tumor concerning for malignancy or bleeding varices.  Distal esophagus biopsy showed poorly differentiated adenocarcinoma.  Staging CT CAP on 11/14/2020 showed the primary irregular mass of the distal esophageal and gastric cardia extending into the soft tissue medial to the gastric cardia with conglomerate adenopathy as well as distal paraesophageal lymph nodes measuring 0.6 cm, a 1.0 x 0.9 cm hypodense right hepatic lobe lesion, pathologically enlarged retroperitoneal adenopathy up to 1.6 cm, and a subacute burst L5 compression fracture overall concerning for metastatic disease.   Socially, he is widowed and lives alone, his wife died of a GBM in May 22, 2015 treated by Dr. Simeon Craft surgeon Dr. Tammi Klippel.  He can cook and do light housework, and drive himself locally.  Stairs are difficult.  He has a Secretary/administrator and someone to do the yard.  He is also socially active at church.  He has 2 adult daughters, [name] is his primary care contact  who lives in [place], the closest family  member to him.  He has another daughter age 48 recently diagnosed stage II breast cancer s/p bilateral mastectomy.  Patient's mother had ovarian cancer and father had prostate cancer.  [name] has had genetic testing and is negative.  Patient formerly drank alcohol 3/day, quit 04/2020.  A former cigarette smoker 2 packs/day x 15 years quit 1974.  He denies other drug use.  Today he presents with his daughter [name].  He was hospitalized in January for spontaneous bacterial peritonitis and omeprazole was stopped.  A couple months later he thought he was having a GERD flare and developed painful and difficult swallowing that worsened in the last month, now severe. He became weak, remembers sitting down hard and injured his back, pain is improving.  He has not been able to eat solids in over a week.  He does tolerate a full liquid diet, ensure complete, and some pills with occasional regurgitation.  He lost 15 pounds in the last few weeks, 40 pounds in the last 6 months.  He is on sucralfate and Prilosec.  He has abdominal tenderness, bloating is not bad.  He manages constipation with stool softener at night.  MEDICAL HISTORY:  Past Medical History:  Diagnosis Date   AKI (acute kidney injury) ([place])    Alcohol abuse    Arthritis    Diarrhea    Edema leg    GERD (gastroesophageal reflux disease)    Hyperbilirubinemia    Hyperlipemia    Hypertension    Hypokalemia    Liver failure ([facility])    Macular degeneration, wet ([facility])    Microcytic anemia    Osteoarthritis    SOB (shortness of breath)    Weight gain     SURGICAL HISTORY: Past Surgical History:  Procedure Laterality Date   ANKLE SURGERY     BIOPSY  11/11/2020   Procedure: BIOPSY;  Surgeon: [name], MD;  Location: [facility];  Service: Gastroenterology;;   CATARACT EXTRACTION, BILATERAL     ESOPHAGOGASTRODUODENOSCOPY (EGD) WITH PROPOFOL N/A 11/11/2020   Procedure: ESOPHAGOGASTRODUODENOSCOPY (EGD) WITH PROPOFOL;  Surgeon: [name],  MD;  Location: [facility];  Service: Gastroenterology;  Laterality: N/A;   FOREIGN BODY REMOVAL  11/11/2020   Procedure: FOREIGN BODY REMOVAL;  Surgeon: [name], MD;  Location: [facility];  Service: Gastroenterology;;   IR PARACENTESIS  05/09/2020   TONSILLECTOMY      SOCIAL HISTORY: Social History   Socioeconomic History   Marital status: Widowed    Spouse name: Not on file   Number of children: 2   Years of education: Not on file   Highest education level: Bachelor's degree (e.g., BA, AB, BS)  Occupational History   Occupation: retired  Tobacco Use   Smoking status: Former    Types: Cigarettes    Quit date: 1974    Years since quitting: 48.6   Smokeless tobacco: Never  Vaping Use   Vaping Use: Never used  Substance and Sexual Activity   Alcohol use: Not Currently    Alcohol/week: 3.0 standard drinks    Types: 3 Glasses of wine per week    Comment: 2-3 drinks/night   Drug use: Never   Sexual activity: Not on file  Other Topics Concern   Not on file  Social History Narrative   Widowed   2 daughter   Right handed   Retired   Drinks coffee 1 cup a day, NO Tea NO soda   Social Determinants of Health  Financial Resource Strain: Not on file  Food Insecurity: Not on file  Transportation Needs: Not on file  Physical Activity: Not on file  Stress: Not on file  Social Connections: Not on file  Intimate Partner Violence: Not on file    FAMILY HISTORY: Family History  Problem Relation Age of Onset   Colon cancer Mother 44   Ovarian cancer Mother    Prostate cancer Father    Healthy Brother    Breast cancer Daughter        x1 oldest daughter   Hashimoto's thyroiditis Daughter        youngest daughter    ALLERGIES:  is allergic to lisinopril.  MEDICATIONS:  Current Outpatient Medications  Medication Sig Dispense Refill   brimonidine (ALPHAGAN) 0.2 % ophthalmic solution Place 1 drop into the left eye 3 (three) times daily.     cetirizine (ZYRTEC) 10 MG  tablet Take 10 mg by mouth daily.     dorzolamide-timolol (COSOPT) 22.3-6.8 MG/ML ophthalmic solution Place 1 drop into the left eye 2 (two) times daily.     EPINEPHrine 0.3 mg/0.3 mL IJ SOAJ injection Inject 0.3 mg into the muscle as needed for anaphylaxis.     ezetimibe (ZETIA) 10 MG tablet Take 1 tablet (10 mg total) by mouth daily. 90 tablet 3   fluticasone (FLONASE) 50 MCG/ACT nasal spray Place 1 spray into both nostrils daily as needed for allergies.     folic acid (FOLVITE) 1 MG tablet Take 1 tablet (1 mg total) by mouth daily. 30 tablet 0   furosemide (LASIX) 40 MG tablet Take 1 tablet (40 mg total) by mouth daily. 30 tablet 3   GLUCOSAMINE-CHONDROITIN PO Take 1 capsule by mouth daily.     latanoprost (XALATAN) 0.005 % ophthalmic solution Place 1 drop into the left eye daily.     Multiple Vitamins-Minerals (ONE-A-DAY MENS 50+) TABS Take 1 tablet by mouth daily.     Multiple Vitamins-Minerals (PRESERVISION AREDS 2 PO) Take 1 tablet by mouth 2 (two) times daily.     Netarsudil Dimesylate (RHOPRESSA) 0.02 % SOLN Place 1 drop into the left eye daily.     omeprazole (PRILOSEC) 20 MG capsule Take 20 mg by mouth 2 (two) times daily before a meal.     potassium chloride SA (KLOR-CON) 10 MEQ tablet Take 1 tablet (10 mEq total) by mouth daily. 7 tablet 0   spironolactone (ALDACTONE) 50 MG tablet Take 50 mg by mouth daily.     sucralfate (CARAFATE) 1 g tablet Take 1 g by mouth 2 (two) times daily.     lactulose (CHRONULAC) 10 GM/15ML solution Take 15 mLs (10 g total) by mouth daily as needed for mild constipation. 236 mL 0   No current facility-administered medications for this visit.    REVIEW OF SYSTEMS:   Constitutional: Denies fevers, chills or abnormal night sweats (+) fatigue (+) weight loss 40 lbs in 6 months (15 lbs in 1 month) Eyes: Denies blurriness of vision, double vision or watery eyes (+) macular degeneration q5 week avastin injections Ears, nose, mouth, throat, and face: Denies  mucositis or sore throat Respiratory: Denies cough, dyspnea or wheezes Cardiovascular: Denies palpitation, chest discomfort or lower extremity swelling Gastrointestinal:  Denies nausea, vomiting, diarrhea, hematochezia, melena, heartburn (+) constipation (+) ascites (+) abdominal pain (+) dysphagia  Skin: Denies abnormal skin rashes Lymphatics: Denies new lymphadenopathy or easy bruising Neurological:Denies numbness, tingling or new weaknesses (+) ambulates with cane  Behavioral/Psych: Mood is stable, no new changes  All other systems were reviewed with the patient and are negative.  PHYSICAL EXAMINATION: ECOG PERFORMANCE STATUS: 1 - Symptomatic but completely ambulatory  Vitals:   11/25/20 1122  BP: 129/66  Pulse: 79  Resp: 18  Temp: 98.7 F (37.1 C)  SpO2: 97%   Filed Weights   11/25/20 1122  Weight: 166 lb (75.3 kg)    GENERAL:alert, no distress and comfortable SKIN: No rash EYES: sclera injected, anicteric  NECK: Without mass LYMPH:  no palpable cervical or supraclavicular lymphadenopathy LUNGS: clear with normal breathing effort HEART: regular rate & rhythm, no lower extremity edema ABDOMEN:abdomen soft, mildly distended, non-tender and normal bowel sounds Musculoskeletal: No focal spinal tenderness PSYCH: alert & oriented x 3 with fluent speech NEURO: Ambulates with a cane  LABORATORY DATA:  I have reviewed the data as listed CBC Latest Ref Rng & Units 09/17/2020 05/21/2020 05/09/2020  WBC 3.8 - 10.8 Thousand/uL 6.5 7.5 8.2  Hemoglobin 13.2 - 17.1 g/dL 12.4(L) 11.3(L) 10.9(L)  Hematocrit 38.5 - 50.0 % 38.2(L) 33.2(L) 32.5(L)  Platelets 140 - 400 Thousand/uL 211 285 PLATELET CLUMPS NOTED ON SMEAR, COUNT APPEARS DECREASED   CMP Latest Ref Rng & Units 09/17/2020 05/21/2020 05/11/2020  Glucose 65 - 99 mg/dL 141(H) 121(H) 106(H)  BUN 7 - 25 mg/dL _0 Creatinine 0.70 - 1.11 mg/dL 0.99 0.90 1.06  Sodium 135 - 146 mmol/L 138 141 141  Potassium 3.5 - 5.3 mmol/L 3.6  3.8 3.6  Chloride 98 - 110 mmol/L 100 105 107  CO2 20 - 32 mmol/L _1 Calcium 8.6 - 10.3 mg/dL 9.3 8.9 7.6(L)  Total Protein 6.1 - 8.1 g/dL 6.7 6.1 -  Total Bilirubin 0.2 - 1.2 mg/dL 0.5 1.5(H) -  Alkaline Phos 38 - 126 U/L - - -  AST 10 - 35 U/L 18 44(H) -  ALT 9 - 46 U/L 9 30 -     RADIOGRAPHIC STUDIES: I have personally reviewed the radiological images as listed and agreed with the findings in the report. CT CHEST ABDOMEN PELVIS W CONTRAST  Result Date: 11/14/2020 CLINICAL DATA:  New diagnosis of poorly differentiated adenocarcinoma at the gastroesophageal junction EXAM: CT CHEST, ABDOMEN, AND PELVIS WITH CONTRAST TECHNIQUE: Multidetector CT imaging of the chest, abdomen and pelvis was performed following the standard protocol during bolus administration of intravenous contrast. CONTRAST:  152m ISOVUE-300 IOPAMIDOL (ISOVUE-300) INJECTION 61% COMPARISON:  05/04/2020 CT scan FINDINGS: CT CHEST FINDINGS Cardiovascular: Coronary, aortic arch, and branch vessel atherosclerotic vascular disease. Azygos fissure noted. Mediastinum/Nodes: Distal paraesophageal lymph node 0.6 cm in short axis on image 42 series 2. Posterior distal paraesophageal lymph node 0.6 cm in short axis, image 50 series 2. Abnormal wall thickening in the distal esophagus as on images 52-53 of series 2 extending into a irregular mass of the gastroesophageal junction with invasion of the gastric cardia as observed on images 55 through 58 of series 2. Lungs/Pleura: Mild scarring in the right lower lobe and posteriorly in the right upper lobe. Minimal scarring or subsegmental atelectasis in the lingula. Musculoskeletal: Mild bilateral gynecomastia. Old healed left lateral fourth and fifth rib fractures. Small rim sclerotic lesion in the right seventh rib posterolaterally on image 78 series 4 is probably incidental. Thoracic spondylosis with multilevel bridging spurring. CT ABDOMEN PELVIS FINDINGS Hepatobiliary: 1.0 by 0.9 cm  hypodense lesion inferiorly in the right hepatic lobe on image 21 series 11, nonspecific, metastatic lesion not excluded. No other significant focal hepatic lesion is observed. Gallbladder unremarkable. No biliary  dilatation. Pancreas: Unremarkable Spleen: Unremarkable Adrenals/Urinary Tract: Unremarkable Stomach/Bowel: Irregular mass of the distal esophagus and gastric cardia extending into the soft tissues medial to the gastric cardia with conglomerate adenopathy in this region poorly separated from the tumor. Vascular/Lymphatic: Aortoiliac atherosclerotic vascular disease. Adenopathy in the right gastric chain is mostly clumped in with the tumor extending out from the gastric cardia. Part of the nodal conglomerate on image 60 of series 2 measures 1.9 cm in short axis. A lymph node between the stomach in the spleen measures 0.9 cm in short axis on image 53 series 2. There is pathologic retroperitoneal adenopathy including a left periaortic lymph node measuring 1.6 cm in short axis on image 75 of series 2 and an aortocaval lymph node measuring 1.3 cm in short axis on that same image, in addition to multiple other enlarged retroperitoneal lymph nodes. A retrocaval node measures 1.4 cm in short axis on image 71 series 2. A portacaval node measures 1.4 cm in short axis on image 66 series 2. Reproductive: Unremarkable Other: Mild thickening along the left paracolic gutter but no overt nodularity, considering the previous ascites this may well simply be benign. I do not see any nodularity or caking along the omentum. There is only a trace amount of ascites near the seminal vesicles, as compared to the larger amount of ascites shown on 05/04/2020. Musculoskeletal: New subacute burst compression fracture at L5 involving the anterior and middle columns with some extension to the posterior vertebral body surface, and with the dominant fracture plane extending coronally through the vertebral body with about 50% of loss of  vertebral body height (AO type A4 fracture). Stable superior endplate compression fracture at L2. IMPRESSION: 1. Aggressive appearing new tumor of the gastroesophageal junction confluent with the irregular collection of right gastric lymph nodes, and with new associated metastatic retroperitoneal adenopathy. Small paraesophageal lymph nodes in the thorax are increased in size from prior exam and could also be involved, and there is a nonspecific 1.0 cm lesion inferiorly in the right hepatic lobe which could potentially be metastatic. 2. New subacute burst (AO type A4) fracture of L5, with mild posterior bony retropulsion and with a dominant coronally oriented fracture plane, and about 50% loss of height. Correlate with interval trauma. 3. Slight thickening along the left paracolic gutter and trace pelvic ascites, although the ascites is markedly reduced compared to 05/04/2020. 4. Other imaging findings of potential clinical significance: Aortic Atherosclerosis (ICD10-I70.0). Coronary atherosclerosis. Old compression fracture at L2. These results were called by telephone at the time of interpretation on 11/14/2020 at 1:39 pm to provider Dr. [name], who verbally acknowledged these results. Based on our discussion, the patient has an established relationship with Dr. Junius Roads in Sports Medicine, and at Dr. Perley Jain request I will forward a copy of this report to Dr. Junius Roads, and Dr. Perley Jain office will arrange for the patient to see Dr. Junius Roads regarding the spine findings. Electronically Signed   By: Van Clines M.D.   On: 11/14/2020 13:44    ASSESSMENT & PLAN: 80 year old male  1.Locally advanced adenocarcinoma of the distal esophagus/GE junction, probable metastatic RP adenopathy -We reviewed his medical record in detail with the patient and his daughter.   -He presented with progressive dysphagia and weight loss, work-up showed moderate stenosis in the distal third of the esophagus invading the gastric  cardia, path confirmed poorly differentiated adenocarcinoma.  Staging CT CAP showed at least locally advanced primary tumor and enlarged retroperitoneal adenopathy concerning for metastatic disease.  There is a small indeterminate 1 cm right hepatic lobe lesion.  There is a subacute burst compression fracture at L5, patient attributes to impact on sitting, pain is improving with a brace. -He is being referred for PET scan to complete staging. -Even if his PET scan is negative for metastatic disease, due to his age, social situation (lives alone), and comorbidities he is likely not a surgical candidate.  We discussed the aggressive nature of esophageal cancer, with high recurrence rate even after surgical resection. -We reviewed without surgical resection, this is likely not curable but still treatable. -He has significant dysphagia with weight loss, not tolerating solids at this point.  We discussed proceeding with palliative radiation and have referred him to Dr. Lisbeth Renshaw.  We will monitor his nutritional status and weight closely and refer him for feeding tube if needed. We will arrange supportive IV fluids as needed during RT -Given his age and co-morbidities, we do not plan to give concurrent chemo with RT -We will request molecular testing including MMR, PD-L1, and HER2 studies on the primary tumor to see if he is a candidate for immunotherapy or biological therapy after he completes radiation. -He agrees to baseline labs today including CBC, CMP, iron studies, and CEA tumor marker -We will see him back in 2 weeks after further work-up to review results and recommendations  2.  Dysphagia, odynophagia, and weight loss -Onset early 2020-06-03 after hospitalization for bacterial peritonitis, 40 lbs in 6 months  -in past few weeks he lost 15 lbs due to severe dysphagia and odynophagia -EGD showed numerous pills in middle/distal esophagus which were removed. -Encouraged to increase nutrition supplements such as  boost/Ensure, pured food, high caloric intake, and soft diet.  We recommend to avoid bread and meat as these are difficult to swallow -He was encouraged to switch tablet/capsule medication to liquid as appropriate  3.  Cirrhosis, abdominal pain -felt to be alcohol-related. He has abstained from alcohol since June 03, 2020 -S/p paracentesis 05/09/2020 yielding 4.4 liters, path showed reactive mesothelial cells -Compensated, hepatic function panel 09/17/2020 WNL -followed by GI  4.Social support -He is widowed and lives alone, his wife died from [place] in 06-04-15, she was treated here -2 daughters live out of town, [name] lives in [place] and is the closest, also his primary contact for healthcare -He has church community and some family friends who could help if needed -Patient is independent with ADLs but has a Secretary/administrator and yard service.  He is open to social work referral for transportation if needed, I referred him  5.Family history  -Patient's mother had ovarian cancer, father had prostate cancer, and a daughter has breast cancer at age 36 -His daughter [name] without cancer underwent genetic testing and is negative -Patient agrees to genetic referral, order placed today  PLAN: -Work up reviewed, PET scan in the next week to complete staging -Lab today -Refer to nutrition/dietician, genetics, social work, palliative care, and rad onc (Dr. Vincent Gros, PA) -MMR, PD-L1, and HER2 molecular testing on biopsy via email request to path lab -Follow up in 2 weeks to review PET scan and molecular studies   Orders Placed This Encounter  Procedures   NM PET Image Initial (PI) Skull Base To Thigh    Standing Status:   Future    Standing Expiration Date:   11/25/2021    Order Specific Question:   If indicated for the ordered procedure, I authorize the administration of a radiopharmaceutical per Radiology protocol    Answer:  Yes    Order Specific Question:   Preferred imaging location?    Answer:    [facility]   CBC with Differential (Cancer Center Only)    Standing Status:   Future    Standing Expiration Date:   11/25/2021   CEA (IN HOUSE-[facility])FOR [facility] ONLY    Standing Status:   Future    Standing Expiration Date:   11/25/2021   CMP ([facility] only)    Standing Status:   Future    Standing Expiration Date:   11/25/2021   Ferritin    Standing Status:   Future    Standing Expiration Date:   11/25/2021   Iron and TIBC    Standing Status:   Future    Standing Expiration Date:   11/25/2021   Ambulatory referral to Genetics    Referral Priority:   Routine    Referral Type:   Consultation    Referral Reason:   Specialty Services Required    Number of Visits Requested:   1   Ambulatory Referral to [facility] Nutrition    Referral Priority:   Urgent    Referral Type:   Consultation    Referral Reason:   Specialty Services Required    Number of Visits Requested:   1   Ambulatory referral to Radiation Oncology    Referral Priority:   Routine    Referral Type:   Consultation    Referral Reason:   Specialty Services Required    Requested Specialty:   Radiation Oncology    Number of Visits Requested:   1   Ambulatory referral to Social Work    Referral Priority:   Routine    Referral Type:   Consultation    Referral Reason:   Specialty Services Required    Number of Visits Requested:   1   Amb Referral to Palliative Care    Referral Priority:   Routine    Referral Type:   Consultation    Number of Visits Requested:   1     All questions were answered. The patient knows to call the clinic with any problems, questions or concerns.     [name], NP 11/25/2020 12:49 PM   Addendum  I have seen the patient, examined him. I agree with the assessment and and plan and have edited the notes.   80 year old gentleman with past medical hypertension, alcohol related liver cirrhosis with history of ascites, no history of Barrett's esophagitis and GERD, presented with dysphagia,  odynophagia and weight loss.  Work-up revealed a large mass in the distal esophagus and GE junction, biopsy showed poorly differentiated adenocarcinoma.  CT scan unfortunately showed retroperitoneal adenopathy which is highly suspicious for metastasis.  I recommend PET scan for further evaluation.  Likely incurable metastatic cancer.  I discussed the treatment options, including palliative radiation, Chemotherapy, immunotherapy and targeted therapy.  Due to his advanced age and medical comorbidities, especially liver cirrhosis, he is not a candidate for intensive chemo.  Due to his dysphagia, amenable to referral to radiation oncologist Dr. Lisbeth Renshaw for palliative radiation.  We will request molecular testing on his biopsy, including MSI/MMR, PD-L1 and HER2. Will get lab today. Refer him to nutrition, SW and genetics.  All questions were answered.  I plan to see him back in 2 weeks, to finalize his systemic treatment plan.  [name]  11/25/2020

## 2020-11-25 ENCOUNTER — Inpatient Hospital Stay: Payer: Medicare Other | Attending: Nurse Practitioner | Admitting: Nurse Practitioner

## 2020-11-25 ENCOUNTER — Inpatient Hospital Stay: Payer: Medicare Other

## 2020-11-25 ENCOUNTER — Other Ambulatory Visit: Payer: Self-pay

## 2020-11-25 ENCOUNTER — Encounter: Payer: Self-pay | Admitting: Nurse Practitioner

## 2020-11-25 ENCOUNTER — Other Ambulatory Visit: Payer: Self-pay | Admitting: Gastroenterology

## 2020-11-25 ENCOUNTER — Telehealth: Payer: Self-pay

## 2020-11-25 DIAGNOSIS — R634 Abnormal weight loss: Secondary | ICD-10-CM | POA: Diagnosis not present

## 2020-11-25 DIAGNOSIS — K7460 Unspecified cirrhosis of liver: Secondary | ICD-10-CM | POA: Insufficient documentation

## 2020-11-25 DIAGNOSIS — C155 Malignant neoplasm of lower third of esophagus: Secondary | ICD-10-CM

## 2020-11-25 DIAGNOSIS — K222 Esophageal obstruction: Secondary | ICD-10-CM | POA: Diagnosis not present

## 2020-11-25 DIAGNOSIS — C787 Secondary malignant neoplasm of liver and intrahepatic bile duct: Secondary | ICD-10-CM | POA: Insufficient documentation

## 2020-11-25 DIAGNOSIS — Z79899 Other long term (current) drug therapy: Secondary | ICD-10-CM | POA: Insufficient documentation

## 2020-11-25 LAB — CBC WITH DIFFERENTIAL (CANCER CENTER ONLY)
Abs Immature Granulocytes: 0.02 10*3/uL (ref 0.00–0.07)
Basophils Absolute: 0.1 10*3/uL (ref 0.0–0.1)
Basophils Relative: 1 %
Eosinophils Absolute: 0.2 10*3/uL (ref 0.0–0.5)
Eosinophils Relative: 3 %
HCT: 37.1 % — ABNORMAL LOW (ref 39.0–52.0)
Hemoglobin: 12.6 g/dL — ABNORMAL LOW (ref 13.0–17.0)
Immature Granulocytes: 0 %
Lymphocytes Relative: 29 %
Lymphs Abs: 2.5 10*3/uL (ref 0.7–4.0)
MCH: 30.4 pg (ref 26.0–34.0)
MCHC: 34 g/dL (ref 30.0–36.0)
MCV: 89.6 fL (ref 80.0–100.0)
Monocytes Absolute: 0.5 10*3/uL (ref 0.1–1.0)
Monocytes Relative: 6 %
Neutro Abs: 5.2 10*3/uL (ref 1.7–7.7)
Neutrophils Relative %: 61 %
Platelet Count: 232 10*3/uL (ref 150–400)
RBC: 4.14 MIL/uL — ABNORMAL LOW (ref 4.22–5.81)
RDW: 13 % (ref 11.5–15.5)
WBC Count: 8.5 10*3/uL (ref 4.0–10.5)
nRBC: 0 % (ref 0.0–0.2)

## 2020-11-25 LAB — CMP (CANCER CENTER ONLY)
ALT: 7 U/L (ref 0–44)
AST: 16 U/L (ref 15–41)
Albumin: 3.5 g/dL (ref 3.5–5.0)
Alkaline Phosphatase: 97 U/L (ref 38–126)
Anion gap: 14 (ref 5–15)
BUN: 15 mg/dL (ref 8–23)
CO2: 29 mmol/L (ref 22–32)
Calcium: 9.6 mg/dL (ref 8.9–10.3)
Chloride: 98 mmol/L (ref 98–111)
Creatinine: 1.09 mg/dL (ref 0.61–1.24)
GFR, Estimated: >60 mL/min (ref >60)
Glucose, Bld: 114 mg/dL — ABNORMAL HIGH (ref 70–99)
Potassium: 3.1 mmol/L — ABNORMAL LOW (ref 3.5–5.1)
Sodium: 141 mmol/L (ref 135–145)
Total Bilirubin: 0.8 mg/dL (ref 0.3–1.2)
Total Protein: 7.6 g/dL (ref 6.5–8.1)

## 2020-11-25 LAB — FERRITIN: Ferritin: 41 ng/mL (ref 24–336)

## 2020-11-25 LAB — IRON AND TIBC
Iron: 55 ug/dL (ref 42–163)
Saturation Ratios: 17 % — ABNORMAL LOW (ref 20–55)
TIBC: 328 ug/dL (ref 202–409)
UIBC: 273 ug/dL (ref 117–376)

## 2020-11-25 LAB — CEA (IN HOUSE-CHCC): CEA (CHCC-In House): 4.17 ng/mL (ref 0.00–5.00)

## 2020-11-25 NOTE — Telephone Encounter (Signed)
Per [name], NP, called [facility] to set pt up with palliative care services. LVM for return call to get pt set up.

## 2020-11-25 NOTE — Progress Notes (Signed)
I met with Mr Bergum and his daughter, [name].  I explained my role as GI Oncology Nurse Navigator and provided my direct contact information.  I told him I would schedule the PET scan and call him with appt date, time, and instructions.  I also told them our Education officer, museum, dietician, and radiation/oncology department will be reaching out to him.  All questions answered.  They verbalized understanding.

## 2020-11-25 NOTE — Progress Notes (Signed)
Request for MMR, PD-L1, and HER2 testing on biopsy tissue from 11/11/2020 sent to [facility] pathology department.

## 2020-11-25 NOTE — Telephone Encounter (Signed)
-----   Message from [name], NP sent at 11/25/2020  1:17 PM EDT ----- Dr. 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 Does not need feeding tube at this point but close. We have referred him to you for palliative RT. We do not plan to give concurrent chemo due to his age, social situation (lives alone), and co-morbidities. We also have not referred to Dr. Kipp Brood or back to GI for EUS given likely metastatic disease.   Lexine Baton and Santiago Glad, please help to get his PET scan approved and scheduled asap. Order is in.   I will email lab to add on molecular studies.   Mickel Baas, please contact [facility] for palliative home services, not hospice. Not urgent. Order is in.   Thanks everyone, [name]

## 2020-11-26 ENCOUNTER — Telehealth: Payer: Self-pay | Admitting: Dietician

## 2020-11-26 ENCOUNTER — Encounter: Payer: Self-pay | Admitting: Licensed Clinical Social Worker

## 2020-11-26 ENCOUNTER — Other Ambulatory Visit: Payer: Self-pay

## 2020-11-26 ENCOUNTER — Inpatient Hospital Stay: Payer: Medicare Other | Admitting: Dietician

## 2020-11-26 ENCOUNTER — Telehealth: Payer: Self-pay | Admitting: Nurse Practitioner

## 2020-11-26 ENCOUNTER — Other Ambulatory Visit: Payer: Self-pay | Admitting: Nurse Practitioner

## 2020-11-26 DIAGNOSIS — E876 Hypokalemia: Secondary | ICD-10-CM

## 2020-11-26 MED ORDER — POTASSIUM CHLORIDE 20 MEQ/15ML (10%) PO SOLN: 20.0000 meq | Freq: Two times a day (BID) | ORAL | 2 refills | Status: DC

## 2020-11-26 NOTE — Telephone Encounter (Signed)
Nutrition Assessment  Reason for Assessment: Provider request   ASSESSMENT: 79 year old male with newly diagnosed esophageal cancer. He is pending start of palliative radiation. Further treatment plan pending PET results, molecular studies.   Past medical history includes HTN, alcohol cirrhosis with ascites, chronic anemia, hypokalemia, macular degeneration  Spoke with patient and his 2 daughters [name] and [name]) this morning via telephone. Patient reports he has an appetite but it is not as good as it once was. Patient reports progressive dysphagia over the last few months, says he went ~31 days without eating solid foods. Patient consuming full liquids. He recalls drinking milkshakes, Ensure Complete, lots of water, and eating soups in the evening (had [employer] baked potato soup for dinner last night). Daughter [name] reports patient has an emersion blender that she pulled out for him to use as needed. Patient reports constipation, he is taking stool softener.   Nutrition Focused Physical Exam: unable to complete at this time   Medications: Lasix, Klor-con, Carafate, Lactulose   Labs: 8/29 - K 3.1, Glucose 114   Anthropometrics: Patient is ~44 lbs (21%) under his reported usual body weight. Last weight 166 lb on 8/29 decreased 14 lbs (7.8%) from 180 lb on 6/21. This is significant for time frame  Height: 5'11" Weight: 75.3 kg UBW: 210-212 lb (per pt) BMI: 23.15   Estimated Energy Needs  Kcals: [phone] Protein: 120-135 Fluid: 2.3 L   NUTRITION DIAGNOSIS: Inadequate oral intake related to cancer as evidenced by dysphagia with solids and significant 7.8% (14 lb) weight loss in 2 months.    MALNUTRITION DIAGNOSIS: Given energy intake <75% of needs for > 1 month per dietary recall and significant weight loss, highly suspect degree of malnutrition however unable to identify at this time.    INTERVENTION:  Educated on importance of adequate calorie and protein energy intake  to maintain strength, weights, nutrition Discussed intake of soft, smooth foods and ways to alter textures of foods - will mail handout Continue consuming high calorie, high protein easy to swallow foods as tolerated, provided suggestions on ways to add calories/protein to foods (using whole fat milks, adding cheese, butter, cream, oral supplements mixed with ice cream for nutrient dense shakes) - will mail handout with recipes Encouraged high calorie, high protein oral nutrition supplements - recommended 6 Ensure Plus/day (350 kcal, 16 grams protein) or 4 Boost VHC/day (530 kcal, 22 grams protein) - will mail coupons Discussed possible need for feeding tube if unable to maintain weight with full liquid/soft foods Contact information provided   MONITORING, EVALUATION, GOAL: Patient will tolerate increased calories and protein to promote weight stability/gain   Next Visit: Tuesday September 13 with Pamala Hurry

## 2020-11-26 NOTE — Progress Notes (Signed)
[place] Work  Clinical Social Work was referred by NP for assessment of psychosocial needs.  Clinical Social Worker contacted patient by phone  to offer support and assess for needs.    Patient reports doing fairly well so far and being familiar with the support services available as wife was treated here in 2017 (pt is now widowed).  He has two daughters, one of whom lives in [place] and works for [employer]. She works remotely, so is planning to be present for his appointments.  No resource needs at this time.  CSW reviewed support services available and provided direct contact information should any needs arise.    [place], [place] Worker [employer]

## 2020-11-26 NOTE — Telephone Encounter (Signed)
Scheduled follow-up appointment per 8/29 los. Patient is aware. 

## 2020-11-26 NOTE — Progress Notes (Signed)
Opened in error

## 2020-11-27 NOTE — Progress Notes (Signed)
Consult note faxed to Dr Watt Climes at [phone].

## 2020-11-27 NOTE — Progress Notes (Signed)
GI Location of Tumor / Histology: Adenocarcinoma of distal third of esophagus  Randy Wood presented with complaints of worsening GERD and dysphagia with pills and solids.  PET 12/11/2020:  CT CAP 11/14/2020: Aggressive appearing new tumor of the gastroesophageal junction confluent with the irregular collection of right gastric lymph nodes, and with new associated metastatic retroperitoneal adenopathy. Small paraesophageal lymph nodes in the thorax are increased in size from prior exam and could also be involved, and there is a nonspecific 1.0 cm lesion inferiorly in the right hepatic lobe which could potentially be metastatic.  New subacute burst (AO type A4) fracture of L5.  EGD 11/11/2020: Malignant appearing esophageal stenosis.  Pills were found in the esophagus.  Removal was successful.  Rule out malignancy,? gastric tumor in the cardia difficult to say based on bleeding from passing the scope and unable to wash and suction for adequate visualization.  Normal ampulla, duodenal bulb, first portion of the duodenum, second portion of the duodenum, major papilla and area of the papilla.  The examination was otherwise normal.  Biopsies of Esophageal Mass 11/11/2020   Past/Anticipated interventions by surgeon, if any:   Past/Anticipated interventions by medical oncology, if any:  Randy Rue NP/ Dr. Burr Medico 11/25/2020 -He is being referred for PET scan to complete staging. -Even if his PET scan is negative for metastatic disease, due to his age, social situation (lives alone), and comorbidities he is likely not a surgical candidate.  We discussed the aggressive nature of esophageal cancer, with high recurrence rate even after surgical resection. -We reviewed without surgical resection, this is likely not curable but still treatable. -He has significant dysphagia with weight loss, not tolerating solids at this point.  We discussed proceeding with palliative radiation and have referred him to Dr.  Lisbeth Renshaw.  -Given his age and co-morbidities, we do not plan to give concurrent chemo with RT -We will request molecular testing including MMR, PD-L1, and HER2 studies on the primary tumor to see if he is a candidate for immunotherapy or biological therapy after he completes radiation.   Weight changes, if any: Notes recent weight loss about 15 pounds.  Lost about 40 pounds in the last 6 months.  Nausea / Vomiting, if any: No  Pain issues, if any: He notes some pain when he first starts to drink something.   Diet: soups, ensure, smoothies, protein powder.  He is not tolerating any solid food at this time.  He is still able to get his pills down, they are very small.  He will speak with the doctors about any of his pills and their ability to be crushed.  He is using carafate.  SAFETY ISSUES: Prior radiation? No Pacemaker/ICD? No Possible current pregnancy? N/a Is the patient on methotrexate? No  Current Complaints/Details:

## 2020-11-28 ENCOUNTER — Other Ambulatory Visit: Payer: Self-pay

## 2020-11-28 ENCOUNTER — Ambulatory Visit
Admission: RE | Admit: 2020-11-28 | Discharge: 2020-11-28 | Disposition: A | Discharge status: Home / Self Care | Payer: Medicare Other | Source: Ambulatory Visit | Attending: Radiation Oncology | Admitting: Radiation Oncology

## 2020-11-28 ENCOUNTER — Encounter: Payer: Self-pay | Admitting: Licensed Clinical Social Worker

## 2020-11-28 ENCOUNTER — Encounter: Payer: Self-pay | Admitting: Radiation Oncology

## 2020-11-28 VITALS — BP 119/64 | HR 83 | Temp 96.7°F | Resp 18 | Ht 71.0 in | Wt 166.2 lb

## 2020-11-28 DIAGNOSIS — K219 Gastro-esophageal reflux disease without esophagitis: Secondary | ICD-10-CM | POA: Diagnosis not present

## 2020-11-28 DIAGNOSIS — Z87891 Personal history of nicotine dependence: Secondary | ICD-10-CM | POA: Insufficient documentation

## 2020-11-28 DIAGNOSIS — C155 Malignant neoplasm of lower third of esophagus: Secondary | ICD-10-CM

## 2020-11-28 DIAGNOSIS — D649 Anemia, unspecified: Secondary | ICD-10-CM | POA: Insufficient documentation

## 2020-11-28 DIAGNOSIS — Z800 Family history of malignant neoplasm of digestive organs: Secondary | ICD-10-CM | POA: Diagnosis not present

## 2020-11-28 DIAGNOSIS — S32051A Stable burst fracture of fifth lumbar vertebra, initial encounter for closed fracture: Secondary | ICD-10-CM | POA: Diagnosis not present

## 2020-11-28 DIAGNOSIS — M1990 Unspecified osteoarthritis, unspecified site: Secondary | ICD-10-CM | POA: Diagnosis not present

## 2020-11-28 DIAGNOSIS — Z79899 Other long term (current) drug therapy: Secondary | ICD-10-CM | POA: Insufficient documentation

## 2020-11-28 DIAGNOSIS — Z8041 Family history of malignant neoplasm of ovary: Secondary | ICD-10-CM | POA: Diagnosis not present

## 2020-11-28 DIAGNOSIS — E785 Hyperlipidemia, unspecified: Secondary | ICD-10-CM | POA: Diagnosis not present

## 2020-11-28 DIAGNOSIS — Z803 Family history of malignant neoplasm of breast: Secondary | ICD-10-CM | POA: Insufficient documentation

## 2020-11-28 DIAGNOSIS — C787 Secondary malignant neoplasm of liver and intrahepatic bile duct: Secondary | ICD-10-CM | POA: Diagnosis not present

## 2020-11-28 DIAGNOSIS — I10 Essential (primary) hypertension: Secondary | ICD-10-CM | POA: Diagnosis not present

## 2020-11-28 DIAGNOSIS — Z510 Encounter for antineoplastic radiation therapy: Secondary | ICD-10-CM | POA: Insufficient documentation

## 2020-11-28 NOTE — Progress Notes (Signed)
[facility] Psychosocial Distress Screening Clinical Social Work  Clinical Social Work was referred by distress screening protocol.  The patient scored a 7 on the Psychosocial Distress Thermometer which indicates moderate distress. Clinical Social Worker spoke with patient on 11/26/20 to assess for needs and provided information on support services. Patient will call as needs arise.   ON[facility]N DISTRESS SCREENING 11/28/2020  Screening Type Initial Screening  Distress experienced in past week (1-10) 7  Emotional problem type Adjusting to illness;Boredom  Spiritual/Religous concerns type Relating to God  Physical Problem type Loss of appetitie;Constipation/diarrhea  Other Contact via phone [phone]     Randy Rise E Emmali Karow, LCSW

## 2020-11-28 NOTE — Addendum Note (Signed)
Encounter addended by: Hayden Pedro, PA-C on: 11/28/2020 9:23 PM  Actions taken: In Basket message sent, Clinical Note Signed

## 2020-11-28 NOTE — Progress Notes (Addendum)
Radiation Oncology         [phone] ________________________________  Name: Randy Wood        MRN: [id number]  Date of Service: 11/28/2020 DOB: 04/01/1940  BT:DVVOH, [name], MD  [name], MD     REFERRING PHYSICIAN: [name], MD   DIAGNOSIS: The encounter diagnosis was Primary adenocarcinoma of distal third of esophagus ([place]).   HISTORY OF PRESENT ILLNESS: Randy Wood is a 80 y.o. male seen at the request of Dr. Burr Medico for a diagnosis of adenocarcinoma of the distal esophagus.  The patient has a longstanding history of GERD and progressive symptoms of this and dysphagia to pills and solids recently became more noticeable, he was seen by Dr. Watt Climes and underwent endoscopy on 11/11/2020 a large area of circumferential scarring or stenosis was identified this was intrinsic and it was traversed with some trauma and self-limited bleeding pills were found in the middle third of the esophagus and in the lower third of the esophagus and a medium sized polypoid mass was noted versus inflammatory varices.  Biopsies were obtained and consistent with poorly differentiated adenocarcinoma.  CT staging of the chest abdomen and pelvis on 11/14/2020 showed an aggressive appearing lesion in the gastroesophageal junction confluent with the irregular collection of the right gastric lymph nodes and retroperitoneal adenopathy.  Small paraesophageal nodes in the thorax were noted and a 1 cm lesion inferiorly in the right hepatic lobe was noted and could not rule out disease.  A subacute burst fracture of L5 with posterior bony retropulsion was also noted with a 50% loss of height.  Thickening along the left pericolic gutter and trace pelvic ascites was noted but lesser than what it had been in February 2022.  Dr. Junius Roads his sports medicine provider recommended an LSO back brace, and no surgery was recommended.  He has met with Dr. Luis Abed who has ordered a PET scan.  This is scheduled for 12/11/2020.  He is seen today to  discuss options of radiotherapy.  Of note his comorbidities have been reviewed by Dr. Burr Medico and she does not recommend chemotherapy.    PREVIOUS RADIATION THERAPY: No   PAST MEDICAL HISTORY:  Past Medical History:  Diagnosis Date   AKI (acute kidney injury) ([place])    Alcohol abuse    Arthritis    Diarrhea    Edema leg    Esophageal cancer (HCC)    GERD (gastroesophageal reflux disease)    Hyperbilirubinemia    Hyperlipemia    Hypertension    Hypokalemia    Liver failure (HCC)    Macular degeneration, wet (HCC)    Microcytic anemia    Osteoarthritis    SOB (shortness of breath)    Weight gain        PAST SURGICAL HISTORY: Past Surgical History:  Procedure Laterality Date   ANKLE SURGERY     BIOPSY  11/11/2020   Procedure: BIOPSY;  Surgeon: [name], MD;  Location: [facility];  Service: Gastroenterology;;   CATARACT EXTRACTION, BILATERAL     ESOPHAGOGASTRODUODENOSCOPY (EGD) WITH PROPOFOL N/A 11/11/2020   Procedure: ESOPHAGOGASTRODUODENOSCOPY (EGD) WITH PROPOFOL;  Surgeon: [name], MD;  Location: [facility];  Service: Gastroenterology;  Laterality: N/A;   FOREIGN BODY REMOVAL  11/11/2020   Procedure: FOREIGN BODY REMOVAL;  Surgeon: [name], MD;  Location: [facility];  Service: Gastroenterology;;   IR PARACENTESIS  05/09/2020   TONSILLECTOMY       FAMILY HISTORY:  Family History  Problem Relation Age of  Onset   Colon cancer Mother 50   Ovarian cancer Mother    Prostate cancer Father    Healthy Brother    Breast cancer Daughter        x1 oldest daughter   Hashimoto's thyroiditis Daughter        youngest daughter     SOCIAL HISTORY:  reports that he quit smoking about 48 years ago. His smoking use included cigarettes. He has a 30.00 pack-year smoking history. He has never used smokeless tobacco. He reports that he does not currently use alcohol after a past usage of about 3.0 standard drinks per week. He reports that he does not use drugs.  The patient  is a retired Engineer, maintenance ([employer]).  He is accompanied by his granddaughter [name] who is studying nursing at [employer].  He has 2 adult daughters and another granddaughter who also support him at doctors appointments.   ALLERGIES: Lisinopril   MEDICATIONS:  Current Outpatient Medications  Medication Sig Dispense Refill   brimonidine (ALPHAGAN) 0.2 % ophthalmic solution Place 1 drop into the left eye 3 (three) times daily.     cetirizine (ZYRTEC) 10 MG tablet Take 10 mg by mouth daily.     dorzolamide-timolol (COSOPT) 22.3-6.8 MG/ML ophthalmic solution Place 1 drop into the left eye 2 (two) times daily.     EPINEPHrine 0.3 mg/0.3 mL IJ SOAJ injection Inject 0.3 mg into the muscle as needed for anaphylaxis.     ezetimibe (ZETIA) 10 MG tablet Take 1 tablet (10 mg total) by mouth daily. 90 tablet 3   fluticasone (FLONASE) 50 MCG/ACT nasal spray Place 1 spray into both nostrils daily as needed for allergies.     folic acid (FOLVITE) 1 MG tablet Take 1 tablet (1 mg total) by mouth daily. 30 tablet 0   furosemide (LASIX) 40 MG tablet Take 1 tablet (40 mg total) by mouth daily. 30 tablet 3   GLUCOSAMINE-CHONDROITIN PO Take 1 capsule by mouth daily.     lactulose (CHRONULAC) 10 GM/15ML solution Take 15 mLs (10 g total) by mouth daily as needed for mild constipation. 236 mL 0   latanoprost (XALATAN) 0.005 % ophthalmic solution Place 1 drop into the left eye daily.     Multiple Vitamins-Minerals (ONE-A-DAY MENS 50+) TABS Take 1 tablet by mouth daily.     Multiple Vitamins-Minerals (PRESERVISION AREDS 2 PO) Take 1 tablet by mouth 2 (two) times daily.     Netarsudil Dimesylate (RHOPRESSA) 0.02 % SOLN Place 1 drop into the left eye daily.     omeprazole (PRILOSEC) 20 MG capsule Take 20 mg by mouth 2 (two) times daily before a meal.     potassium chloride 20 MEQ/15ML (10%) SOLN Take 15 mLs (20 mEq total) by mouth 2 (two) times daily. 473 mL 2   spironolactone (ALDACTONE) 50 MG tablet Take 50 mg by mouth daily.     sucralfate  (CARAFATE) 1 g tablet Take 1 g by mouth 2 (two) times daily.     No current facility-administered medications for this encounter.     REVIEW OF SYSTEMS: On review of systems, the patient reports that he is doing fairly well.  He has lost about 15 pounds in the last 2 months, but about 40 pounds in the last 6 months.  He truly started developing symptoms just before 4 July and up until that time was tolerating regular foods.  Shortly thereafter he had to transition to softer and softer foods and is now on liquids.  He struggles  with maintaining the volume of intake of Ensure given the discomfort.  He just started using Carafate which seems to have helped ever so slightly and his discomfort.  Once he starts swallowing however he feels that this is easier to continue.  No other complaints are verbalized.    PHYSICAL EXAM:  Wt Readings from Last 3 Encounters:  11/28/20 166 lb 4 oz (75.4 kg)  11/25/20 166 lb (75.3 kg)  11/11/20 160 lb (72.6 kg)   Temp Readings from Last 3 Encounters:  11/28/20 (!) 96.7 F (35.9 C) (Temporal)  11/25/20 98.7 F (37.1 C) (Oral)  11/11/20 98 F (36.7 C) (Axillary)   BP Readings from Last 3 Encounters:  11/28/20 119/64  11/25/20 129/66  11/11/20 (!) 123/58   Pulse Readings from Last 3 Encounters:  11/28/20 83  11/25/20 79  11/11/20 70   Pain Assessment Pain Score: 0-No pain/10  In general this is a well appearing Caucasian male in no acute distress.  He's alert and oriented x4 and appropriate throughout the examination. Cardiopulmonary assessment is negative for acute distress and he exhibits normal effort.     ECOG = 1  0 - Asymptomatic (Fully active, able to carry on all predisease activities without restriction)  1 - Symptomatic but completely ambulatory (Restricted in physically strenuous activity but ambulatory and able to carry out work of a light or sedentary nature. For example, light housework, office work)  2 - Symptomatic, <50% in bed  during the day (Ambulatory and capable of all self care but unable to carry out any work activities. Up and about more than 50% of waking hours)  3 - Symptomatic, >50% in bed, but not bedbound (Capable of only limited self-care, confined to bed or chair 50% or more of waking hours)  4 - Bedbound (Completely disabled. Cannot carry on any self-care. Totally confined to bed or chair)  5 - Death   Eustace Pen MM, Creech RH, Tormey DC, et al. [phone]). "Toxicity and response criteria of the [facility]". [place] Oncol. 5 (6): 649-55    LABORATORY DATA:  Lab Results  Component Value Date   WBC 8.5 11/25/2020   HGB 12.6 (L) 11/25/2020   HCT 37.1 (L) 11/25/2020   MCV 89.6 11/25/2020   PLT 232 11/25/2020   Lab Results  Component Value Date   NA 141 11/25/2020   K 3.1 (L) 11/25/2020   CL 98 11/25/2020   CO2 29 11/25/2020   Lab Results  Component Value Date   ALT 7 11/25/2020   AST 16 11/25/2020   ALKPHOS 97 11/25/2020   BILITOT 0.8 11/25/2020      RADIOGRAPHY: CT CHEST ABDOMEN PELVIS W CONTRAST  Result Date: 11/14/2020 CLINICAL DATA:  New diagnosis of poorly differentiated adenocarcinoma at the gastroesophageal junction EXAM: CT CHEST, ABDOMEN, AND PELVIS WITH CONTRAST TECHNIQUE: Multidetector CT imaging of the chest, abdomen and pelvis was performed following the standard protocol during bolus administration of intravenous contrast. CONTRAST:  164m ISOVUE-300 IOPAMIDOL (ISOVUE-300) INJECTION 61% COMPARISON:  05/04/2020 CT scan FINDINGS: CT CHEST FINDINGS Cardiovascular: Coronary, aortic arch, and branch vessel atherosclerotic vascular disease. Azygos fissure noted. Mediastinum/Nodes: Distal paraesophageal lymph node 0.6 cm in short axis on image 42 series 2. Posterior distal paraesophageal lymph node 0.6 cm in short axis, image 50 series 2. Abnormal wall thickening in the distal esophagus as on images 52-53 of series 2 extending into a irregular mass of the  gastroesophageal junction with invasion of the gastric cardia as observed  on images 55 through 58 of series 2. Lungs/Pleura: Mild scarring in the right lower lobe and posteriorly in the right upper lobe. Minimal scarring or subsegmental atelectasis in the lingula. Musculoskeletal: Mild bilateral gynecomastia. Old healed left lateral fourth and fifth rib fractures. Small rim sclerotic lesion in the right seventh rib posterolaterally on image 78 series 4 is probably incidental. Thoracic spondylosis with multilevel bridging spurring. CT ABDOMEN PELVIS FINDINGS Hepatobiliary: 1.0 by 0.9 cm hypodense lesion inferiorly in the right hepatic lobe on image 21 series 11, nonspecific, metastatic lesion not excluded. No other significant focal hepatic lesion is observed. Gallbladder unremarkable. No biliary dilatation. Pancreas: Unremarkable Spleen: Unremarkable Adrenals/Urinary Tract: Unremarkable Stomach/Bowel: Irregular mass of the distal esophagus and gastric cardia extending into the soft tissues medial to the gastric cardia with conglomerate adenopathy in this region poorly separated from the tumor. Vascular/Lymphatic: Aortoiliac atherosclerotic vascular disease. Adenopathy in the right gastric chain is mostly clumped in with the tumor extending out from the gastric cardia. Part of the nodal conglomerate on image 60 of series 2 measures 1.9 cm in short axis. A lymph node between the stomach in the spleen measures 0.9 cm in short axis on image 53 series 2. There is pathologic retroperitoneal adenopathy including a left periaortic lymph node measuring 1.6 cm in short axis on image 75 of series 2 and an aortocaval lymph node measuring 1.3 cm in short axis on that same image, in addition to multiple other enlarged retroperitoneal lymph nodes. A retrocaval node measures 1.4 cm in short axis on image 71 series 2. A portacaval node measures 1.4 cm in short axis on image 66 series 2. Reproductive: Unremarkable Other: Mild  thickening along the left paracolic gutter but no overt nodularity, considering the previous ascites this may well simply be benign. I do not see any nodularity or caking along the omentum. There is only a trace amount of ascites near the seminal vesicles, as compared to the larger amount of ascites shown on 05/04/2020. Musculoskeletal: New subacute burst compression fracture at L5 involving the anterior and middle columns with some extension to the posterior vertebral body surface, and with the dominant fracture plane extending coronally through the vertebral body with about 50% of loss of vertebral body height (AO type A4 fracture). Stable superior endplate compression fracture at L2. IMPRESSION: 1. Aggressive appearing new tumor of the gastroesophageal junction confluent with the irregular collection of right gastric lymph nodes, and with new associated metastatic retroperitoneal adenopathy. Small paraesophageal lymph nodes in the thorax are increased in size from prior exam and could also be involved, and there is a nonspecific 1.0 cm lesion inferiorly in the right hepatic lobe which could potentially be metastatic. 2. New subacute burst (AO type A4) fracture of L5, with mild posterior bony retropulsion and with a dominant coronally oriented fracture plane, and about 50% loss of height. Correlate with interval trauma. 3. Slight thickening along the left paracolic gutter and trace pelvic ascites, although the ascites is markedly reduced compared to 05/04/2020. 4. Other imaging findings of potential clinical significance: Aortic Atherosclerosis (ICD10-I70.0). Coronary atherosclerosis. Old compression fracture at L2. These results were called by telephone at the time of interpretation on 11/14/2020 at 1:39 pm to provider Dr. [name], who verbally acknowledged these results. Based on our discussion, the patient has an established relationship with Dr. Junius Roads in Sports Medicine, and at Dr. Perley Jain request I will  forward a copy of this report to Dr. Junius Roads, and Dr. Perley Jain office will arrange for the  patient to see Dr. Junius Roads regarding the spine findings. Electronically Signed   By: Van Clines M.D.   On: 11/14/2020 13:44       IMPRESSION/PLAN: 1. At least locally advanced poorly differentiated adenocarcinoma of the distal esophagus. Dr. Lisbeth Renshaw discusses the pathology findings and reviews the nature of locally advanced esophageal cancer.  We discussed the rationale for proceeding with a PET scan which we were able to move up to the next Tuesday.  Dr. Lisbeth Renshaw offers a course of either palliative radiotherapy versus definitive dose radiotherapy understanding that there would be limitations of describing this as curative since he will not be receiving chemotherapy. . If he does have metastatic disease would recommend a 3-week palliative course of radiotherapy.  We discussed the risks, benefits, short, and long term effects of radiotherapy, as well as the differences in intent, and the patient is interested in proceeding and will simulate today but start treatment after his PET imaging. Written consent is obtained and placed in the chart, a copy was provided to the patient. He will simulate this morning.  In a visit lasting 60 minutes, greater than 50% of the time was spent face to face discussing the patient's condition, in preparation for the discussion, and coordinating the patient's care.   The above documentation reflects my direct findings during this shared patient visit. Please see the separate note by Dr. Lisbeth Renshaw on this date for the remainder of the patient's plan of care.    Carola Rhine, [facility]   **Disclaimer: This note was dictated with voice recognition software. Similar sounding words can inadvertently be transcribed and this note may contain transcription errors which may not have been corrected upon publication of note.**

## 2020-11-29 LAB — SURGICAL PATHOLOGY

## 2020-12-03 ENCOUNTER — Other Ambulatory Visit: Payer: Self-pay

## 2020-12-03 ENCOUNTER — Encounter (HOSPITAL_COMMUNITY)
Admission: RE | Admit: 2020-12-03 | Discharge: 2020-12-03 | Disposition: A | Discharge status: Home / Self Care | Payer: Medicare Other | Source: Ambulatory Visit | Attending: Nurse Practitioner | Admitting: Nurse Practitioner

## 2020-12-03 DIAGNOSIS — I2510 Atherosclerotic heart disease of native coronary artery without angina pectoris: Secondary | ICD-10-CM | POA: Diagnosis not present

## 2020-12-03 DIAGNOSIS — C787 Secondary malignant neoplasm of liver and intrahepatic bile duct: Secondary | ICD-10-CM | POA: Diagnosis not present

## 2020-12-03 DIAGNOSIS — I313 Pericardial effusion (noninflammatory): Secondary | ICD-10-CM | POA: Diagnosis not present

## 2020-12-03 DIAGNOSIS — C155 Malignant neoplasm of lower third of esophagus: Secondary | ICD-10-CM | POA: Diagnosis not present

## 2020-12-03 DIAGNOSIS — N3289 Other specified disorders of bladder: Secondary | ICD-10-CM | POA: Diagnosis not present

## 2020-12-03 LAB — GLUCOSE, CAPILLARY: Glucose-Capillary: 119 mg/dL — ABNORMAL HIGH (ref 70–99)

## 2020-12-03 MED ORDER — FLUDEOXYGLUCOSE F - 18 (FDG) INJECTION
8.6000 | Freq: Once | INTRAVENOUS | Status: AC
  Administered 2020-12-03: 8.35 via INTRAVENOUS

## 2020-12-04 ENCOUNTER — Other Ambulatory Visit: Payer: Self-pay

## 2020-12-04 NOTE — Progress Notes (Signed)
The proposed treatment discussed in conference is for discussion purpose only and is not a binding recommendation.  The patients have not been physically examined, or presented with their treatment options.  Therefore, final treatment plans cannot be decided.  

## 2020-12-05 NOTE — Progress Notes (Signed)
Mr Apley called requesting that his nutrition appt be changed to a phone visit.  I have done this.

## 2020-12-09 ENCOUNTER — Other Ambulatory Visit: Payer: Self-pay | Admitting: Genetic Counselor

## 2020-12-09 ENCOUNTER — Encounter: Payer: Self-pay | Admitting: Genetic Counselor

## 2020-12-09 ENCOUNTER — Inpatient Hospital Stay: Payer: Medicare Other

## 2020-12-09 ENCOUNTER — Inpatient Hospital Stay: Payer: Medicare Other | Attending: Nurse Practitioner | Admitting: Genetic Counselor

## 2020-12-09 ENCOUNTER — Other Ambulatory Visit: Payer: Self-pay | Admitting: *Deleted

## 2020-12-09 ENCOUNTER — Other Ambulatory Visit: Payer: Self-pay

## 2020-12-09 DIAGNOSIS — C155 Malignant neoplasm of lower third of esophagus: Secondary | ICD-10-CM

## 2020-12-09 DIAGNOSIS — D539 Nutritional anemia, unspecified: Secondary | ICD-10-CM

## 2020-12-09 DIAGNOSIS — C787 Secondary malignant neoplasm of liver and intrahepatic bile duct: Secondary | ICD-10-CM | POA: Diagnosis not present

## 2020-12-09 DIAGNOSIS — Z8041 Family history of malignant neoplasm of ovary: Secondary | ICD-10-CM | POA: Insufficient documentation

## 2020-12-09 DIAGNOSIS — Z800 Family history of malignant neoplasm of digestive organs: Secondary | ICD-10-CM | POA: Diagnosis not present

## 2020-12-09 DIAGNOSIS — K7200 Acute and subacute hepatic failure without coma: Secondary | ICD-10-CM

## 2020-12-09 DIAGNOSIS — Z803 Family history of malignant neoplasm of breast: Secondary | ICD-10-CM

## 2020-12-09 DIAGNOSIS — Z510 Encounter for antineoplastic radiation therapy: Secondary | ICD-10-CM | POA: Diagnosis not present

## 2020-12-09 DIAGNOSIS — K7460 Unspecified cirrhosis of liver: Secondary | ICD-10-CM

## 2020-12-09 DIAGNOSIS — N179 Acute kidney failure, unspecified: Secondary | ICD-10-CM

## 2020-12-09 DIAGNOSIS — Z8042 Family history of malignant neoplasm of prostate: Secondary | ICD-10-CM

## 2020-12-09 HISTORY — DX: Family history of malignant neoplasm of prostate: Z80.42

## 2020-12-09 HISTORY — DX: Family history of malignant neoplasm of breast: Z80.3

## 2020-12-09 HISTORY — DX: Family history of malignant neoplasm of ovary: Z80.41

## 2020-12-09 LAB — CBC WITH DIFFERENTIAL (CANCER CENTER ONLY)
Abs Immature Granulocytes: 0.02 10*3/uL (ref 0.00–0.07)
Basophils Absolute: 0.1 10*3/uL (ref 0.0–0.1)
Basophils Relative: 1 %
Eosinophils Absolute: 0.3 10*3/uL (ref 0.0–0.5)
Eosinophils Relative: 5 %
HCT: 35.7 % — ABNORMAL LOW (ref 39.0–52.0)
Hemoglobin: 11.7 g/dL — ABNORMAL LOW (ref 13.0–17.0)
Immature Granulocytes: 0 %
Lymphocytes Relative: 27 %
Lymphs Abs: 2 10*3/uL (ref 0.7–4.0)
MCH: 30.1 pg (ref 26.0–34.0)
MCHC: 32.8 g/dL (ref 30.0–36.0)
MCV: 91.8 fL (ref 80.0–100.0)
Monocytes Absolute: 0.5 10*3/uL (ref 0.1–1.0)
Monocytes Relative: 7 %
Neutro Abs: 4.5 10*3/uL (ref 1.7–7.7)
Neutrophils Relative %: 60 %
Platelet Count: 232 10*3/uL (ref 150–400)
RBC: 3.89 MIL/uL — ABNORMAL LOW (ref 4.22–5.81)
RDW: 13.5 % (ref 11.5–15.5)
WBC Count: 7.4 10*3/uL (ref 4.0–10.5)
nRBC: 0 % (ref 0.0–0.2)

## 2020-12-09 LAB — CMP (CANCER CENTER ONLY)
ALT: 10 U/L (ref 0–44)
AST: 20 U/L (ref 15–41)
Albumin: 3.4 g/dL — ABNORMAL LOW (ref 3.5–5.0)
Alkaline Phosphatase: 90 U/L (ref 38–126)
Anion gap: 9 (ref 5–15)
BUN: 18 mg/dL (ref 8–23)
CO2: 26 mmol/L (ref 22–32)
Calcium: 9.8 mg/dL (ref 8.9–10.3)
Chloride: 104 mmol/L (ref 98–111)
Creatinine: 1.14 mg/dL (ref 0.61–1.24)
GFR, Estimated: >60 mL/min (ref >60)
Glucose, Bld: 113 mg/dL — ABNORMAL HIGH (ref 70–99)
Potassium: 4.4 mmol/L (ref 3.5–5.1)
Sodium: 139 mmol/L (ref 135–145)
Total Bilirubin: 0.6 mg/dL (ref 0.3–1.2)
Total Protein: 7.3 g/dL (ref 6.5–8.1)

## 2020-12-09 LAB — GENETIC SCREENING ORDER

## 2020-12-09 NOTE — Progress Notes (Signed)
REFERRING PROVIDER: [name], NP [place],  [place] [address]  PRIMARY PROVIDER:  [name], [name], MD  PRIMARY REASON FOR VISIT:  Encounter Diagnoses  Name Primary?   Primary adenocarcinoma of distal third of esophagus ([place]) Yes   Family history of breast cancer    Family history of ovarian cancer    Family history of prostate cancer      HISTORY OF PRESENT ILLNESS:   Mr. Stowers, a 80 y.o. male, was seen for a [place] cancer genetics consultation at the request of Cira Rue, NP due to a family history of cancer.  Mr. Kreiser presents to clinic today to discuss the possibility of a hereditary predisposition to cancer, to discuss genetic testing, and to further clarify his future cancer risks, as well as potential cancer risks for family members.   In August 2022, at the age of 51, Mr. Bodner was diagnosed with adenocarcinoma of the esophagus.    CANCER HISTORY:  Oncology History  Primary adenocarcinoma of distal third of esophagus ([place])  11/11/2020 Procedure   EGD by Dr. Watt Climes - Normal larynx. -? Malignant-appearing esophageal stenosis. Biopsied. - Pills were found in the esophagus. Removal was successful. - Rule out malignancy,? gastric tumor in the cardia difficult to say based on bleeding from passing the scope and unable to wash and suction for adequate visualization. - Normal ampulla, duodenal bulb, first portion of the duodenum, second portion of the duodenum, major papilla and area of the papilla. - The examination was otherwise normal.   11/11/2020 Initial Biopsy   FINAL MICROSCOPIC DIAGNOSIS:  A. ESOPHAGUS, DISTAL, BIOPSY:  -  Poorly differentiated adenocarcinoma  -  See comment    11/11/2020 Initial Diagnosis   Primary adenocarcinoma of distal third of esophagus ([place])   11/14/2020 Imaging   CT CAPIMPRESSION: 1. Aggressive appearing new tumor of the gastroesophageal junction confluent with the irregular collection of right gastric  lymph nodes, and with new associated metastatic retroperitoneal adenopathy. Small paraesophageal lymph nodes in the thorax are increased in size from prior exam and could also be involved, and there is a nonspecific 1.0 cm lesion inferiorly in the right hepatic lobe which could potentially be metastatic. 2. New subacute burst (AO type A4) fracture of L5, with mild posterior bony retropulsion and with a dominant coronally oriented fracture plane, and about 50% loss of height. Correlate with interval trauma. 3. Slight thickening along the left paracolic gutter and trace pelvic ascites, although the ascites is markedly reduced compared to 05/04/2020. 4. Other imaging findings of potential clinical significance: Aortic Atherosclerosis (ICD10-I70.0). Coronary atherosclerosis. Old compression fracture at L2.     RISK FACTORS:  Colonoscopy: yes; most recent in 2014; hyperplastic polyp detected Prostate cancer screening: previously annually; stopped ~2015 Dermatology annually.    Past Medical History:  Diagnosis Date   AKI (acute kidney injury) ([place])    Alcohol abuse    Arthritis    Diarrhea    Edema leg    Esophageal cancer (HCC)    GERD (gastroesophageal reflux disease)    Hyperbilirubinemia    Hyperlipemia    Hypertension    Hypokalemia    Liver failure (HCC)    Macular degeneration, wet (HCC)    Microcytic anemia    Osteoarthritis    SOB (shortness of breath)    Weight gain     Past Surgical History:  Procedure Laterality Date   ANKLE SURGERY     BIOPSY  11/11/2020   Procedure: BIOPSY;  Surgeon: [name], MD;  Location: [facility];  Service: Gastroenterology;;   CATARACT EXTRACTION, BILATERAL     ESOPHAGOGASTRODUODENOSCOPY (EGD) WITH PROPOFOL N/A 11/11/2020   Procedure: ESOPHAGOGASTRODUODENOSCOPY (EGD) WITH PROPOFOL;  Surgeon: [name], MD;  Location: [facility];  Service: Gastroenterology;  Laterality: N/A;   FOREIGN BODY REMOVAL  11/11/2020   Procedure:  FOREIGN BODY REMOVAL;  Surgeon: [name], MD;  Location: [facility];  Service: Gastroenterology;;   IR PARACENTESIS  05/09/2020   TONSILLECTOMY      Social History   Socioeconomic History   Marital status: Widowed    Spouse name: Not on file   Number of children: 2   Years of education: Not on file   Highest education level: Bachelor's degree (e.g., BA, AB, BS)  Occupational History   Occupation: retired  Tobacco Use   Smoking status: Former    Packs/day: 2.00    Years: 15.00    Pack years: 30.00    Types: Cigarettes    Quit date: 1972/05/09    Years since quitting: 48.7   Smokeless tobacco: Never  Vaping Use   Vaping Use: Never used  Substance and Sexual Activity   Alcohol use: Not Currently    Alcohol/week: 3.0 standard drinks    Types: 3 Glasses of wine per week    Comment: 2-3 drinks/night, none since 2020-05-09   Drug use: Never   Sexual activity: Not Currently  Other Topics Concern   Not on file  Social History Narrative   Widowed, wife died GBM 2015/05/10 treated by Dr. Alvy Bimler and Dr. Tammi Klippel    2 daughters, [name] (lives in [place], commutes to [place], primary health contact for pt) and [name]   Right handed   Retired   Drinks coffee 1 cup a day, NO Tea NO soda   Social Determinants of Radio broadcast assistant Strain: Not on file  Food Insecurity: Not on file  Transportation Needs: Not on file  Physical Activity: Not on file  Stress: Not on file  Social Connections: Not on file     FAMILY HISTORY:  We obtained a detailed, 4-generation family history.  Significant diagnoses are listed below: Family History  Problem Relation Age of Onset   Ovarian cancer Mother 73   Prostate cancer Father        dx > 55   Breast cancer Daughter 81   Stomach cancer Cousin        d. 42; maternal male cousin   Colon cancer Paternal Grandmother        d. 12s     Mr. Kneebone is unaware of previous family history of genetic testing for hereditary cancer risks besides  that mentioned above. Patient's ancestors are of English/Western European descent. There is no reported Ashkenazi Jewish ancestry. There is no known consanguinity.  GENETIC COUNSELING ASSESSMENT: Mr. Burkel is a 80 y.o. male with a family history of cancer which is somewhat suggestive of a hereditary cancer syndrome and predisposition to cancer given the presence of related cancers and cancers at a young age in the family. We, therefore, discussed and recommended the following at today's visit.   DISCUSSION: We discussed that 5 - 10% of cancer is hereditary, with most cases of breast and ovarian associated with mutations in BRCA1/2.  There are other genes that can be associated with hereditary cancer syndromes.  Type of cancer risk and level of risk are gene-specific.  We discussed that testing is beneficial for several reasons including knowing how to follow  individuals after completing their treatment, identifying whether potential treatment options would be beneficial, and understanding if other family members could be at risk for cancer and allowing them to undergo genetic testing.   We reviewed the characteristics, features and inheritance patterns of hereditary cancer syndromes. We also discussed genetic testing, including the appropriate family members to test, the process of testing, insurance coverage and turn-around-time for results. We discussed the implications of a negative, positive, carrier and/or variant of uncertain significant result. We recommended Mr. Mcginn pursue genetic testing for a panel that includes genes associated with breast, ovarian, and prostate cancer.   Mr. Mantell  was offered a common hereditary cancer panel (47 genes) and an expanded pan-cancer panel (84 genes). Mr. Pelissier was informed of the benefits and limitations of each panel, including that expanded pan-cancer panels contain genes that do not have clear management guidelines at this point in time.  We also  discussed that as the number of genes included on a panel increases, the chances of variants of uncertain significance increases.  After considering the benefits and limitations of each gene panel, Mr. Lowden  elected to have an expanded pan-cancer panel through [facility].  The Multi-Cancer + RNA Panel offered by [facility] includes sequencing and/or deletion/duplication analysis of the following 84 genes:  AIP*, ALK, APC*, ATM*, AXIN2*, BAP1*, BARD1*, BLM*, BMPR1A*, BRCA1*, BRCA2*, BRIP1*, CASR, CDC73*, CDH1*, CDK4, CDKN1B*, CDKN1C*, CDKN2A, CEBPA, CHEK2*, CTNNA1*, DICER1*, DIS3L2*, EGFR, EPCAM, FH*, FLCN*, GATA2*, GPC3, GREM1, HOXB13, HRAS, KIT, MAX*, MEN1*, MET, MITF, MLH1*, MSH2*, MSH3*, MSH6*, MUTYH*, NBN*, NF1*, NF2*, NTHL1*, PALB2*, PDGFRA, PHOX2B, PMS2*, POLD1*, POLE*, POT1*, PRKAR1A*, PTCH1*, PTEN*, RAD50*, RAD51C*, RAD51D*, RB1*, RECQL4, RET, RUNX1*, SDHA*, SDHAF2*, SDHB*, SDHC*, SDHD*, SMAD4*, SMARCA4*, SMARCB1*, SMARCE1*, STK11*, SUFU*, TERC, TERT, TMEM127*, Tp53*, TSC1*, TSC2*, VHL*, WRN*, and WT1.  RNA analysis is performed for * genes.  Based on Mr. Marion's personal history of cancer, he meets medical criteria for genetic testing. Despite that he meets criteria, he may still have an out of pocket cost. We discussed that if his out of pocket cost for testing is over $100, the laboratory should contact him to discuss self-pay and/or patient pay assistance programs.   PLAN: After considering the risks, benefits, and limitations, Mr. Hershman provided informed consent to pursue genetic testing and the blood sample was sent to [facility] for analysis of the Multi-Cancer +RNA Panel. Results should be available within approximately 3 weeks' time, at which point they will be disclosed by telephone to Mr. Bartha, as will any additional recommendations warranted by these results. Mr. Pontillo will receive a summary of his genetic counseling visit and a copy of his results once available. This  information will also be available in Epic.   Lastly, we encouraged Mr. Yuhasz to remain in contact with cancer genetics annually so that we can continuously update the family history and inform him of any changes in cancer genetics and testing that may be of benefit for this family.   Mr. Spindler's questions were answered to his satisfaction today. Our contact information was provided should additional questions or concerns arise. Thank you for the referral and allowing [place] to share in the care of your patient.   Ninfa Giannelli M. Joette Catching, [place], [facility] Genetic Counselor [email or web address] (P) [phone]  The patient was seen for a total of 40 minutes in face-to-face genetic counseling.  The patient was accompanied by his daughter, [name]. Drs. Magrinat, Lindi Adie and/or Burr Medico were available to discuss this case as needed.   _______________________________________________________________________ For Office  Staff:  Number of people involved in session: 2 Was an Intern/ student involved with case: yes; [employer] intern, Raymond Gurney, conducted this session under my supervision

## 2020-12-10 ENCOUNTER — Inpatient Hospital Stay: Payer: Medicare Other | Admitting: Nutrition

## 2020-12-10 ENCOUNTER — Inpatient Hospital Stay (HOSPITAL_BASED_OUTPATIENT_CLINIC_OR_DEPARTMENT_OTHER): Payer: Medicare Other | Admitting: Hematology

## 2020-12-10 ENCOUNTER — Ambulatory Visit: Payer: Medicare Other | Admitting: Radiation Oncology

## 2020-12-10 ENCOUNTER — Telehealth: Payer: Self-pay | Admitting: Hematology

## 2020-12-10 ENCOUNTER — Encounter: Payer: Self-pay | Admitting: Hematology

## 2020-12-10 ENCOUNTER — Telehealth: Payer: Self-pay | Admitting: Nutrition

## 2020-12-10 VITALS — BP 133/69 | HR 81 | Temp 98.1°F | Resp 17 | Ht 71.0 in | Wt 166.0 lb

## 2020-12-10 DIAGNOSIS — C155 Malignant neoplasm of lower third of esophagus: Secondary | ICD-10-CM

## 2020-12-10 DIAGNOSIS — Z510 Encounter for antineoplastic radiation therapy: Secondary | ICD-10-CM | POA: Diagnosis not present

## 2020-12-10 DIAGNOSIS — C787 Secondary malignant neoplasm of liver and intrahepatic bile duct: Secondary | ICD-10-CM | POA: Diagnosis not present

## 2020-12-10 MED ORDER — CAPECITABINE 500 MG PO TABS
ORAL_TABLET | ORAL | 0 refills | Status: DC
  Filled 2020-12-10: qty 84, fill #0
  Filled 2020-12-25: qty 84, 28d supply, fill #0

## 2020-12-10 NOTE — Progress Notes (Signed)
START OFF PATHWAY REGIMEN - Gastroesophageal   [id number]:Nivolumab 240 mg IV D1 q14 Days x 8 Cycles Followed by Nivolumab 480 mg IV D1 q28 Days:   Cycles 1 through 8: A cycle is every 14 days:     Nivolumab    Cycles 9 and beyond: A cycle is every 28 days:     Nivolumab   **Always confirm dose/schedule in your pharmacy ordering system**  Patient Characteristics: Distant Metastases (cM1/pM1) / Locally Recurrent Disease, Adenocarcinoma - Esophageal, [place], and Gastric, First Line, HER2 Negative/Unknown, PD?L1 Expression CPS < 5/Negative/Unknown, MSS/pMMR or MSI Unknown Histology: Adenocarcinoma Disease Classification: GE Junction Therapeutic Status: Distant Metastases (No Additional Staging) Line of Therapy: First Line HER2 Status: Negative PD-L1 Expression Status: PD-L1 Expression CPS < 5 Microsatellite/Mismatch Repair Status: MSS/pMMR Intent of Therapy: Non-Curative / Palliative Intent, Discussed with Patient

## 2020-12-10 NOTE — Progress Notes (Addendum)
[place]   [phone] [phone]   Clinic Follow up Note   Patient Care Team: [name], [name], MD as PCP - General (Family Medicine) [name], MD as Consulting Physician (Gastroenterology) [name], MD as Consulting Physician (Hematology) [name], NP as Nurse Practitioner (Nurse Practitioner) [name], RN as Registered Nurse  Date of Service:  12/10/2020  CHIEF COMPLAINT: f/u of esophageal cancer  CURRENT THERAPY:  PENDING palliative radiation   ASSESSMENT & PLAN:  Randy Wood is a 80 y.o. male with   1. Adenocarcinoma of the distal esophagus/GE junction, with liver and node metastasis, HER2-, MMR normal, PD-L1 3% -He presented with progressive dysphagia and weight loss, work-up showed moderate stenosis in the distal third of the esophagus invading the gastric cardia, path confirmed poorly differentiated adenocarcinoma.   -PET 12/03/20 showed: esophageal tumor with local perigastric infiltration, hepatic, and nodal metastases; nodal involvement both in chest and abdomen extending to lower retroperitoneum; burst fracture at L5 with increased metabolic activity. I reviewed the images with the patient and his daughter today. -Given the evidence of metastatic disease, he is not a candidate for surgical resection. He met with Dr. Lisbeth Renshaw on 11/28/20 and is planning to proceed with a 3-week course of palliative radiation therapy to primary tumor. He is scheduled for his first treatment on 12/12/20. -Molecular testing showed MMR normal, PD-L1 CPS 3% -We discussed systemic treatment following radiation. I reviewed the options of FOLFOX, CAPOX, or single agent Xeloda etc. due to his advanced age and medical comorbidities, especially liver cirrhosis, I do not think he is a candidate for intensive chemotherapy, I recommend single agent Xeloda (oral), and nivolumab (IV) every 2 or 4 weeks.  We discussed that he may or may not respond to  immunotherapy given the low PD-L 1 expression but still worth of trying  --Chemotherapy consent: Side effects including but does not not limited to, fatigue, nausea, vomiting, diarrhea, hair loss, neuropathy, fluid retention, renal and kidney dysfunction, neutropenic fever, needed for blood transfusion, bleeding, pneumonitis, colitis, thyroid dysfunction or other endocrine dysfunction, etc, were discussed with patient in great detail. He agrees to proceed, after he completes radiation. -The goal of therapy is palliative, to improve his quality of life and prolong his life. -I will see him towards the end of his radiation.   2.  Dysphagia, odynophagia, and weight loss -Onset early 05-28-2020 after hospitalization for bacterial peritonitis, 40 lbs in 6 months  -in past few weeks he lost 15 lbs due to severe dysphagia and odynophagia. His weight has been stable since that drop. -EGD showed numerous pills in middle/distal esophagus which were removed. -Encouraged to increase nutrition supplements such as boost/Ensure, pured food, high caloric intake, and soft diet.  We recommend to avoid bread and meat as these are difficult to swallow -He was encouraged to switch tablet/capsule medication to liquid as appropriate   3.  Cirrhosis, abdominal pain -felt to be alcohol-related. He has abstained from alcohol since 2020/05/28 -S/p paracentesis 05/09/2020 yielding 4.4 liters, path showed reactive mesothelial cells -Compensated, hepatic function panel 09/17/2020 WNL -followed by GI   4.Social support -He is widowed and lives alone, his wife died from [place] in 05-29-15, she was treated here -2 daughters live out of town, Randy Wood lives in [place] and is the closest, also his primary contact for healthcare -He has church community and some family friends who could help if needed -Patient is independent with ADLs but has a Secretary/administrator  and yard service.  He is open to social work referral for transportation if needed, I  referred him   5. Family history  -Patient's mother had ovarian cancer, father had prostate cancer, and a daughter has breast cancer at age 40 -His daughter Randy Wood without cancer underwent genetic testing and is negative -He was referred to genetics and underwent testing on 12/09/20. Results are pending.    PLAN: -proceed with radiation, starting 12/12/20 -f/u in 2 weeks (9/28, 29, or 30) around his radiation appointment, plan to start Xeloda and Nivo after that, will start him on Xeloda 1584m bid for 7 days on and 7 days off for first two cycles    No problem-specific Assessment & Plan notes found for this encounter.   SUMMARY OF ONCOLOGIC HISTORY: Oncology History Overview Note  Cancer Staging No matching staging information was found for the patient.    Primary adenocarcinoma of distal third of esophagus ([facility]  11/11/2020 Procedure   EGD by Dr. MWatt Climes- Normal larynx. -? Malignant-appearing esophageal stenosis. Biopsied. - Pills were found in the esophagus. Removal was successful. - Rule out malignancy,? gastric tumor in the cardia difficult to say based on bleeding from passing the scope and unable to wash and suction for adequate visualization. - Normal ampulla, duodenal bulb, first portion of the duodenum, second portion of the duodenum, major papilla and area of the papilla. - The examination was otherwise normal.   11/11/2020 Initial Biopsy   FINAL MICROSCOPIC DIAGNOSIS:  A. ESOPHAGUS, DISTAL, BIOPSY:  -  Poorly differentiated adenocarcinoma  -  See comment    11/11/2020 Initial Diagnosis   Primary adenocarcinoma of distal third of esophagus ([facility]   11/14/2020 Imaging   CT CAPIMPRESSION: 1. Aggressive appearing new tumor of the gastroesophageal junction confluent with the irregular collection of right gastric lymph nodes, and with new associated metastatic retroperitoneal adenopathy. Small paraesophageal lymph nodes in the thorax are increased in size from prior  exam and could also be involved, and there is a nonspecific 1.0 cm lesion inferiorly in the right hepatic lobe which could potentially be metastatic. 2. New subacute burst (AO type A4) fracture of L5, with mild posterior bony retropulsion and with a dominant coronally oriented fracture plane, and about 50% loss of height. Correlate with interval trauma. 3. Slight thickening along the left paracolic gutter and trace pelvic ascites, although the ascites is markedly reduced compared to 05/04/2020. 4. Other imaging findings of potential clinical significance: Aortic Atherosclerosis (ICD10-I70.0). Coronary atherosclerosis. Old compression fracture at L2.   12/03/2020 PET scan   IMPRESSION: Tumor at the GE junction with local perigastric infiltration, hepatic and nodal metastases.   Nodal involvement both in the chest and abdomen extending to the lower retroperitoneum in the abdomen.   Burst fracture at L5 shows signs of increased metabolic activity, nonspecific in the setting of fracture, in light of other findings would correlate with any history of trauma, if no history of trauma consider MRI to exclude the possibility of pathologic fracture.      INTERVAL HISTORY:  Randy NILSSONis here for a follow up of esophageal cancer. He was last seen by me on 11/25/20 in consultation with NP Lacie. He presents to the clinic accompanied by his daughter. He reports he is doing well. His daughter notes he has been eating well, and she purees food for him. He notes he has Boost to supplement.   All other systems were reviewed with the patient and are negative.  MEDICAL HISTORY:  Past Medical History:  Diagnosis Date   AKI (acute kidney injury) ([place])    Alcohol abuse    Arthritis    Diarrhea    Edema leg    Esophageal cancer ([place])    Family history of breast cancer 12/09/2020   Family history of ovarian cancer 12/09/2020   Family history of prostate cancer 12/09/2020   GERD  (gastroesophageal reflux disease)    Hyperbilirubinemia    Hyperlipemia    Hypertension    Hypokalemia    Liver failure (HCC)    Macular degeneration, wet (HCC)    Microcytic anemia    Osteoarthritis    SOB (shortness of breath)    Weight gain     SURGICAL HISTORY: Past Surgical History:  Procedure Laterality Date   ANKLE SURGERY     BIOPSY  11/11/2020   Procedure: BIOPSY;  Surgeon: [name], MD;  Location: [facility];  Service: Gastroenterology;;   CATARACT EXTRACTION, BILATERAL     ESOPHAGOGASTRODUODENOSCOPY (EGD) WITH PROPOFOL N/A 11/11/2020   Procedure: ESOPHAGOGASTRODUODENOSCOPY (EGD) WITH PROPOFOL;  Surgeon: [name], MD;  Location: [facility];  Service: Gastroenterology;  Laterality: N/A;   FOREIGN BODY REMOVAL  11/11/2020   Procedure: FOREIGN BODY REMOVAL;  Surgeon: [name], MD;  Location: [facility];  Service: Gastroenterology;;   IR PARACENTESIS  05/09/2020   TONSILLECTOMY      I have reviewed the social history and family history with the patient and they are unchanged from previous note.  ALLERGIES:  is allergic to lisinopril.  MEDICATIONS:  Current Outpatient Medications  Medication Sig Dispense Refill   brimonidine (ALPHAGAN) 0.2 % ophthalmic solution Place 1 drop into the left eye 3 (three) times daily.     cetirizine (ZYRTEC) 10 MG tablet Take 10 mg by mouth daily.     dorzolamide-timolol (COSOPT) 22.3-6.8 MG/ML ophthalmic solution Place 1 drop into the left eye 2 (two) times daily.     EPINEPHrine 0.3 mg/0.3 mL IJ SOAJ injection Inject 0.3 mg into the muscle as needed for anaphylaxis.     ezetimibe (ZETIA) 10 MG tablet Take 1 tablet (10 mg total) by mouth daily. 90 tablet 3   fluticasone (FLONASE) 50 MCG/ACT nasal spray Place 1 spray into both nostrils daily as needed for allergies.     folic acid (FOLVITE) 1 MG tablet Take 1 tablet (1 mg total) by mouth daily. 30 tablet 0   furosemide (LASIX) 40 MG tablet Take 1 tablet (40 mg total) by mouth  daily. 30 tablet 3   GLUCOSAMINE-CHONDROITIN PO Take 1 capsule by mouth daily.     lactulose (CHRONULAC) 10 GM/15ML solution Take 15 mLs (10 g total) by mouth daily as needed for mild constipation. 236 mL 0   latanoprost (XALATAN) 0.005 % ophthalmic solution Place 1 drop into the left eye daily.     Multiple Vitamins-Minerals (ONE-A-DAY MENS 50+) TABS Take 1 tablet by mouth daily.     Multiple Vitamins-Minerals (PRESERVISION AREDS 2 PO) Take 1 tablet by mouth 2 (two) times daily.     Netarsudil Dimesylate (RHOPRESSA) 0.02 % SOLN Place 1 drop into the left eye daily.     omeprazole (PRILOSEC) 20 MG capsule Take 20 mg by mouth 2 (two) times daily before a meal.     potassium chloride 20 MEQ/15ML (10%) SOLN Take 15 mLs (20 mEq total) by mouth 2 (two) times daily. 473 mL 2   spironolactone (ALDACTONE) 50 MG tablet Take 50 mg by mouth daily.     sucralfate (CARAFATE)  1 g tablet Take 1 g by mouth 2 (two) times daily.     No current facility-administered medications for this visit.    PHYSICAL EXAMINATION: ECOG PERFORMANCE STATUS: 2 - Symptomatic, <50% confined to bed  Vitals:   12/10/20 0848  BP: 133/69  Pulse: 81  Resp: 17  Temp: 98.1 F (36.7 C)  SpO2: 100%   Wt Readings from Last 3 Encounters:  12/10/20 166 lb (75.3 kg)  11/28/20 166 lb 4 oz (75.4 kg)  11/25/20 166 lb (75.3 kg)     GENERAL:alert, no distress and comfortable SKIN: skin color normal, no rashes or significant lesions EYES: normal, Conjunctiva are pink and non-injected, sclera clear  NEURO: alert & oriented x 3 with fluent speech  LABORATORY DATA:  I have reviewed the data as listed CBC Latest Ref Rng & Units 12/09/2020 11/25/2020 09/17/2020  WBC 4.0 - 10.5 K/uL 7.4 8.5 6.5  Hemoglobin 13.0 - 17.0 g/dL 11.7(L) 12.6(L) 12.4(L)  Hematocrit 39.0 - 52.0 % 35.7(L) 37.1(L) 38.2(L)  Platelets 150 - 400 K/uL 232 232 211     CMP Latest Ref Rng & Units 12/09/2020 11/25/2020 09/17/2020  Glucose 70 - 99 mg/dL 113(H) 114(H)  141(H)  BUN 8 - 23 mg/dL _0 Creatinine 0.61 - 1.24 mg/dL 1.14 1.09 0.99  Sodium 135 - 145 mmol/L 139 141 138  Potassium 3.5 - 5.1 mmol/L 4.4 3.1(L) 3.6  Chloride 98 - 111 mmol/L 104 98 100  CO2 22 - 32 mmol/L _1 Calcium 8.9 - 10.3 mg/dL 9.8 9.6 9.3  Total Protein 6.5 - 8.1 g/dL 7.3 7.6 6.7  Total Bilirubin 0.3 - 1.2 mg/dL 0.6 0.8 0.5  Alkaline Phos 38 - 126 U/L 90 97 -  AST 15 - 41 U/L _2 ALT 0 - 44 U/L _3 RADIOGRAPHIC STUDIES: I have personally reviewed the radiological images as listed and agreed with the findings in the report. No results found.    No orders of the defined types were placed in this encounter.  All questions were answered. The patient knows to call the clinic with any problems, questions or concerns. No barriers to learning was detected. The total time spent in the appointment was 40 minutes.     [name], MD 12/10/2020   I, Wilburn Mylar, am acting as scribe for [name], MD.   I have reviewed the above documentation for accuracy and completeness, and I agree with the above.

## 2020-12-10 NOTE — Telephone Encounter (Signed)
Left message with follow-up appointment per 9/13 los.

## 2020-12-10 NOTE — Progress Notes (Signed)
See telephone note.

## 2020-12-10 NOTE — Telephone Encounter (Signed)
Nutrition follow-up completed with patient and daughter over the telephone. Patient to begin radiation therapy for esophageal cancer. Patient reports weight is stable at 166 pounds. He denies nausea and vomiting. He is having a regular bowel movement by taking stool softeners. Reports he is able to tolerate soft moist foods.  He drinks between 2 and 3 cartons of boost VHC.  He likes to consume a smoothie at breakfast.  He usually has soup for dinner.  His daughter has been blenderizing foods and wonders about blenderized meat.  Nutrition diagnosis: Inadequate oral intake appears improved.  Intervention: Educated patient to continue high-calorie smoothie at breakfast, blenderized soup at dinner and 2-3 cartons of boost VHC daily. Educated on importance of blenderized other foods well and adding liquids to provide a consistency easy for patient to swallow. Reviewed other full liquids. Questions answered.  Teach back method used.  Contact information given.  Monitoring, evaluation, goals: Patient will tolerate increased calories and protein to minimize weight loss throughout treatment.  Next visit: Friday, September 23 by telephone.  **Disclaimer: This note was dictated with voice recognition software. Similar sounding words can inadvertently be transcribed and this note may contain transcription errors which may not have been corrected upon publication of note.**

## 2020-12-11 ENCOUNTER — Telehealth: Payer: Self-pay | Admitting: Pharmacist

## 2020-12-11 ENCOUNTER — Inpatient Hospital Stay (HOSPITAL_COMMUNITY): Admission: RE | Admit: 2020-12-11 | Payer: Medicare Other | Source: Ambulatory Visit

## 2020-12-11 ENCOUNTER — Telehealth: Payer: Self-pay

## 2020-12-11 ENCOUNTER — Other Ambulatory Visit (HOSPITAL_COMMUNITY): Payer: Self-pay

## 2020-12-11 ENCOUNTER — Ambulatory Visit: Payer: Medicare Other

## 2020-12-11 ENCOUNTER — Encounter: Payer: Self-pay | Admitting: Hematology

## 2020-12-11 NOTE — Telephone Encounter (Signed)
Oral Oncology Patient Advocate Encounter   Received notification from [facility] that prior authorization for Xeloda is required.   PA submitted on CoverMyMeds Key [id number] Status is pending   Oral Oncology Clinic will continue to follow.  [place] Patient Randy Wood Phone [phone] Fax [phone] 12/11/2020 8:22 AM

## 2020-12-11 NOTE — Telephone Encounter (Addendum)
Oral Oncology Pharmacist Encounter  Received new prescription for Xeloda (capecitabine) for the treatment of metastatic esophageal cancer in conjunction with nivolumab, planned duration until disease progression or unacceptable drug toxicity.  Prescription dose and frequency assessed for appropriateness. Appropriate for therapy initiation. Per MD note plan to start Xeloda after patient has completed radiation treatments.   CBC w/ Diff and CMP from 12/09/20 assessed, noted Scr 1.14 (CrCl ~54 mL/min) - dose of Xeloda has been dose reduced appropriately.  Current medication list in Epic reviewed, DDIs with Xeloda identified: Category C DDI between Xeloda and Omeprazole - proton-pump inhibitors can decrease efficacy of Xeloda - will discuss with patient alternatives to omeprazole, such as H2RA's like famotidine while on Xeloda.  Category C DDI between [place] and folic acid - folic acid may increase risk of side effects from Xeloda, recommend holding folic acid while on Xeloda.   Evaluated chart and no patient barriers to medication adherence noted.   Patient agreement for treatment documented in MD note on 12/09/20.  Prescription has been e-scribed to the [facility] for benefits analysis and approval.  Oral Oncology Clinic will continue to follow for insurance authorization, copayment issues, initial counseling and start date.  Leron Croak, PharmD, BCPS Hematology/Oncology Clinical Pharmacist [facility] [phone] 12/11/2020 8:34 AM

## 2020-12-12 ENCOUNTER — Ambulatory Visit: Payer: Medicare Other

## 2020-12-12 ENCOUNTER — Other Ambulatory Visit: Payer: Self-pay

## 2020-12-12 ENCOUNTER — Encounter (HOSPITAL_BASED_OUTPATIENT_CLINIC_OR_DEPARTMENT_OTHER): Payer: Self-pay | Admitting: Family Medicine

## 2020-12-12 ENCOUNTER — Ambulatory Visit
Admission: RE | Admit: 2020-12-12 | Discharge: 2020-12-12 | Disposition: A | Discharge status: Home / Self Care | Payer: Medicare Other | Source: Ambulatory Visit | Attending: Radiation Oncology | Admitting: Radiation Oncology

## 2020-12-12 ENCOUNTER — Other Ambulatory Visit (HOSPITAL_COMMUNITY): Payer: Self-pay

## 2020-12-12 ENCOUNTER — Ambulatory Visit (INDEPENDENT_AMBULATORY_CARE_PROVIDER_SITE_OTHER): Payer: Medicare Other | Admitting: Family Medicine

## 2020-12-12 VITALS — BP 118/58 | HR 87 | Ht 71.0 in | Wt 163.2 lb

## 2020-12-12 DIAGNOSIS — M1710 Unilateral primary osteoarthritis, unspecified knee: Secondary | ICD-10-CM

## 2020-12-12 DIAGNOSIS — C155 Malignant neoplasm of lower third of esophagus: Secondary | ICD-10-CM | POA: Diagnosis not present

## 2020-12-12 DIAGNOSIS — Z510 Encounter for antineoplastic radiation therapy: Secondary | ICD-10-CM | POA: Diagnosis not present

## 2020-12-12 DIAGNOSIS — C787 Secondary malignant neoplasm of liver and intrahepatic bile duct: Secondary | ICD-10-CM | POA: Diagnosis not present

## 2020-12-12 NOTE — Patient Instructions (Signed)
  Medication Instructions:  Your physician recommends that you continue on your current medications as directed. Please refer to the Current Medication list given to you today. --If you need a refill on any your medications before your next appointment, please call your pharmacy first. If no refills are authorized on file call the office.-- Follow-Up: Your next appointment:   Your physician recommends that you schedule a follow-up appointment in: 1-2 MONTHS with Dr. [name]  Thanks for letting us be apart of your health journey!!  Primary Care and Sports Medicine   Dr. Arlina Robes [place]   We encourage you to activate your patient portal called "MyChart".  Sign up information is provided on this After Visit Summary.  MyChart is used to connect with patients for Virtual Visits (Telemedicine).  Patients are able to view lab/test results, encounter notes, upcoming appointments, etc.  Non-urgent messages can be sent to your provider as well. To learn more about what you can do with MyChart, please visit --  [email or web address].

## 2020-12-12 NOTE — Assessment & Plan Note (Addendum)
Has established with oncology, scheduled for first radiation treatment today Due to findings of metastatic disease, patient is not a candidate for surgical resection.  As above, he is proceeding with palliative radiation therapy for primary tumor, first treatment today.  Plan is to proceed with systemic treatment following radiation with recommendation for single agent Xeloda and nivolumab every 2 or 4 weeks Patient will be following up with oncology in about 2 weeks

## 2020-12-12 NOTE — Progress Notes (Signed)
New Patient Office Visit  Subjective:  Patient ID: Randy Wood, male    DOB: 1940/12/12  Age: 80 y.o. MRN: [id number]  CC:  Chief Complaint  Patient presents with   Establish Care    Prior PCP Dr. Junius Roads. No concerns or complaints today. Patient only inquiring about whether or not he should receive the covid booster with current treatment plan of cancer.     HPI Randy Wood is an 80 year old male presenting to establish in clinic.  He is accompanied today by one of his daughters.  No specific concerns today.  Past medical history significant for hypertension, osteoarthritis, recent diagnosis of esophageal cancer, prior history of notable alcohol use.  Esophageal cancer: Patient had progressive dysphagia and weight loss and was found to have moderate stenosis in distal third of esophagus with pathology confirmed poorly differentiated adenocarcinoma.  PET scan on 12/03/2020 showed esophageal tumor with local perigastric infiltration as well as metastatic disease.  He has established with oncology and will be undergoing systemic treatment after this with Xeloda and nivolumab.  First radiation treatment was today.  He has primarily been utilizing nutritional drinks for dietary intake.  Osteoarthritis: Patient's prior PCP also managed various joint issues including osteoarthritis of the hands, right knee.  Has received injections in the past to the right knee, with viscosupplementation and steroid injections.  Currently this is doing well.  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  He has also lived in [place], was in [place] for short period and then spent some years in [place] area.  Past Medical History:  Diagnosis Date   AKI (acute kidney injury) ([place])    Alcohol abuse    Arthritis    Cataract 2006   Diarrhea    Edema leg    Esophageal cancer ([place])    Family history of breast  cancer 12/09/2020   Family history of ovarian cancer 12/09/2020   Family history of prostate cancer 12/09/2020   GERD (gastroesophageal reflux disease)    Glaucoma unknown   Hyperbilirubinemia    Hyperlipemia    Hypertension    Hypokalemia    Liver failure ([facility])    Macular degeneration, wet ([facility])    Microcytic anemia    Osteoarthritis    SOB (shortness of breath)    Weight gain     Past Surgical History:  Procedure Laterality Date   ANKLE SURGERY     BIOPSY  11/11/2020   Procedure: BIOPSY;  Surgeon: [name], MD;  Location: [facility];  Service: Gastroenterology;;   CATARACT EXTRACTION, BILATERAL     ESOPHAGOGASTRODUODENOSCOPY (EGD) WITH PROPOFOL N/A 11/11/2020   Procedure: ESOPHAGOGASTRODUODENOSCOPY (EGD) WITH PROPOFOL;  Surgeon: [name], MD;  Location: [facility];  Service: Gastroenterology;  Laterality: N/A;   EYE SURGERY  2006   FOREIGN BODY REMOVAL  11/11/2020   Procedure: FOREIGN BODY REMOVAL;  Surgeon: [name], MD;  Location: [facility];  Service: Gastroenterology;;   IR PARACENTESIS  05/09/2020   TONSILLECTOMY      Family History  Problem Relation Age of Onset   Ovarian cancer Mother 35   Cancer Mother    Prostate cancer Father        dx > 83   Cancer Father    Healthy Brother    Hashimoto's thyroiditis Daughter        youngest daughter   Breast cancer Daughter 42   Stomach  cancer Cousin        d. 42; maternal male cousin   Colon cancer Paternal Grandmother        d. 5s   Cancer Daughter     Social History   Socioeconomic History   Marital status: Widowed    Spouse name: Not on file   Number of children: 2   Years of education: Not on file   Highest education level: Bachelor's degree (e.g., BA, AB, BS)  Occupational History   Occupation: retired  Tobacco Use   Smoking status: Former    Packs/day: 2.00    Years: 15.00    Pack years: 30.00    Types: Cigarettes    Quit date: 03/30/1972    Years since quitting: 48.7   Smokeless  tobacco: Never  Vaping Use   Vaping Use: Never used  Substance and Sexual Activity   Alcohol use: Not Currently    Comment: 2-3 drinks/night, none since 04/2020   Drug use: Never   Sexual activity: Not Currently    Birth control/protection: None  Other Topics Concern   Not on file  Social History Narrative   Widowed, wife died GBM 06-22-2015 treated by Dr. Alvy Bimler and Dr. Tammi Klippel    2 daughters, [name] (lives in [place], commutes to [place], primary health contact for pt) and [name]   Right handed   Retired   Drinks coffee 1 cup a day, NO Tea NO soda   Social Determinants of Radio broadcast assistant Strain: Not on file  Food Insecurity: Not on file  Transportation Needs: Not on file  Physical Activity: Not on file  Stress: Not on file  Social Connections: Not on file  Intimate Partner Violence: Not on file    Objective:   Today's Vitals: BP (!) 118/58   Pulse 87   Ht '5\' 11"'$  (1.803 m)   Wt 163 lb 3.2 oz (74 kg)   SpO2 97%   BMI 22.76 kg/m   Physical Exam  80 year old male in no acute distress Cardiovascular exam with regular rate and rhythm, no murmurs appreciated Lungs clear to auscultation bilaterally Ears: Mild cerumen noted in right auditory canal, no significant cerumen in left auditory canal.  Bilateral tympanic membranes normal in appearance.  Assessment & Plan:   Problem List Items Addressed This Visit       Digestive   Primary adenocarcinoma of distal third of esophagus ([place])    Has established with oncology, scheduled for first radiation treatment today Due to findings of metastatic disease, patient is not a candidate for surgical resection.  As above, he is proceeding with palliative radiation therapy for primary tumor, first treatment today.  Plan is to proceed with systemic treatment following radiation with recommendation for single agent Xeloda and nivolumab every 2 or 4 weeks Patient will be following up with oncology in about 2 weeks         Musculoskeletal and Integument   Osteoarthritis of knee - Primary    Has received injections to right knee due to osteoarthritis with prior PCP Symptoms at present are controlled Will continue to monitor for need of injectable therapy to control symptoms       Outpatient Encounter Medications as of 12/12/2020  Medication Sig   pantoprazole (PROTONIX) 40 MG tablet Take 40 mg by mouth daily.   brimonidine (ALPHAGAN) 0.2 % ophthalmic solution Place 1 drop into the left eye 3 (three) times daily.   capecitabine (XELODA) 500 MG tablet Take 3 tabs in the  morning and evening, 30 minutes after meal,  for 7 days on and 7 days off   cetirizine (ZYRTEC) 10 MG tablet Take 10 mg by mouth daily.   dorzolamide-timolol (COSOPT) 22.3-6.8 MG/ML ophthalmic solution Place 1 drop into the left eye 2 (two) times daily.   EPINEPHrine 0.3 mg/0.3 mL IJ SOAJ injection Inject 0.3 mg into the muscle as needed for anaphylaxis.   ezetimibe (ZETIA) 10 MG tablet Take 1 tablet (10 mg total) by mouth daily.   fluticasone (FLONASE) 50 MCG/ACT nasal spray Place 1 spray into both nostrils daily as needed for allergies.   folic acid (FOLVITE) 1 MG tablet Take 1 tablet (1 mg total) by mouth daily.   furosemide (LASIX) 40 MG tablet Take 1 tablet (40 mg total) by mouth daily.   GLUCOSAMINE-CHONDROITIN PO Take 1 capsule by mouth daily.   lactulose (CHRONULAC) 10 GM/15ML solution Take 15 mLs (10 g total) by mouth daily as needed for mild constipation.   latanoprost (XALATAN) 0.005 % ophthalmic solution Place 1 drop into the left eye daily.   Multiple Vitamins-Minerals (ONE-A-DAY MENS 50+) TABS Take 1 tablet by mouth daily.   Multiple Vitamins-Minerals (PRESERVISION AREDS 2 PO) Take 1 tablet by mouth 2 (two) times daily.   Netarsudil Dimesylate (RHOPRESSA) 0.02 % SOLN Place 1 drop into the left eye daily.   potassium chloride 20 MEQ/15ML (10%) SOLN Take 15 mLs (20 mEq total) by mouth 2 (two) times daily.   spironolactone (ALDACTONE)  50 MG tablet Take 50 mg by mouth daily.   sucralfate (CARAFATE) 1 g tablet Take 1 g by mouth 2 (two) times daily.   [DISCONTINUED] omeprazole (PRILOSEC) 20 MG capsule Take 20 mg by mouth 2 (two) times daily before a meal.   No facility-administered encounter medications on file as of 12/12/2020.   Spent 45 minutes on this patient encounter, including preparation, chart review, face-to-face counseling with patient and coordination of care, and documentation of encounter  Follow-up: Return in about 2 months (around 02/11/2021).   Aubriel Khanna J [name], MD

## 2020-12-12 NOTE — Assessment & Plan Note (Signed)
Has received injections to right knee due to osteoarthritis with prior PCP Symptoms at present are controlled Will continue to monitor for need of injectable therapy to control symptoms

## 2020-12-13 ENCOUNTER — Other Ambulatory Visit: Payer: Self-pay | Admitting: Radiation Oncology

## 2020-12-13 ENCOUNTER — Ambulatory Visit
Admission: RE | Admit: 2020-12-13 | Discharge: 2020-12-13 | Disposition: A | Discharge status: Home / Self Care | Payer: Medicare Other | Source: Ambulatory Visit | Attending: Radiation Oncology | Admitting: Radiation Oncology

## 2020-12-13 DIAGNOSIS — C155 Malignant neoplasm of lower third of esophagus: Secondary | ICD-10-CM | POA: Diagnosis not present

## 2020-12-13 DIAGNOSIS — C787 Secondary malignant neoplasm of liver and intrahepatic bile duct: Secondary | ICD-10-CM | POA: Diagnosis not present

## 2020-12-13 DIAGNOSIS — Z510 Encounter for antineoplastic radiation therapy: Secondary | ICD-10-CM | POA: Diagnosis not present

## 2020-12-13 MED ORDER — SUCRALFATE 1 G PO TABS: 1.0000 g | ORAL_TABLET | Freq: Four times a day (QID) | ORAL | 2 refills | Status: DC

## 2020-12-13 MED ORDER — SONAFINE EX EMUL: 1.0000 "application " | Freq: Once | CUTANEOUS | Status: DC

## 2020-12-13 NOTE — Progress Notes (Signed)
Pt here for patient teaching.  Pt given Radiation and You booklet, skin care instructions, and Sonafine.  Reviewed areas of pertinence such as fatigue, hair loss, skin changes, and throat changes . Pt able to give teach back of to pat skin and use unscented/gentle soap,apply Sonafine bid and avoid applying anything to skin within 4 hours of treatment. Pt verbalizes understanding of information given and will contact nursing with any questions or concerns.     [email or web address]/whattoexpect/index  Gloriajean Dell. Leonie Green, BSN

## 2020-12-16 ENCOUNTER — Other Ambulatory Visit: Payer: Self-pay

## 2020-12-16 ENCOUNTER — Ambulatory Visit
Admission: RE | Admit: 2020-12-16 | Discharge: 2020-12-16 | Disposition: A | Discharge status: Home / Self Care | Payer: Medicare Other | Source: Ambulatory Visit | Attending: Radiation Oncology | Admitting: Radiation Oncology

## 2020-12-16 ENCOUNTER — Encounter (INDEPENDENT_AMBULATORY_CARE_PROVIDER_SITE_OTHER): Payer: Medicare Other | Admitting: Ophthalmology

## 2020-12-16 DIAGNOSIS — I10 Essential (primary) hypertension: Secondary | ICD-10-CM

## 2020-12-16 DIAGNOSIS — C787 Secondary malignant neoplasm of liver and intrahepatic bile duct: Secondary | ICD-10-CM | POA: Diagnosis not present

## 2020-12-16 DIAGNOSIS — H353122 Nonexudative age-related macular degeneration, left eye, intermediate dry stage: Secondary | ICD-10-CM | POA: Diagnosis not present

## 2020-12-16 DIAGNOSIS — H353211 Exudative age-related macular degeneration, right eye, with active choroidal neovascularization: Secondary | ICD-10-CM

## 2020-12-16 DIAGNOSIS — H35033 Hypertensive retinopathy, bilateral: Secondary | ICD-10-CM

## 2020-12-16 DIAGNOSIS — C155 Malignant neoplasm of lower third of esophagus: Secondary | ICD-10-CM | POA: Diagnosis not present

## 2020-12-16 DIAGNOSIS — Z510 Encounter for antineoplastic radiation therapy: Secondary | ICD-10-CM | POA: Diagnosis not present

## 2020-12-16 DIAGNOSIS — H43813 Vitreous degeneration, bilateral: Secondary | ICD-10-CM | POA: Diagnosis not present

## 2020-12-16 NOTE — Progress Notes (Signed)
Randy Wood called stating he received a packet of papers from medicare stating part D denied Xeloda coverage.  He wanted to drop of the packet.  I told him we are notified of denials electronically and did not need the packet.  I forwarded this information to Wynn Maudlin.

## 2020-12-17 ENCOUNTER — Ambulatory Visit
Admission: RE | Admit: 2020-12-17 | Discharge: 2020-12-17 | Disposition: A | Discharge status: Home / Self Care | Payer: Medicare Other | Source: Ambulatory Visit | Attending: Radiation Oncology | Admitting: Radiation Oncology

## 2020-12-17 DIAGNOSIS — C155 Malignant neoplasm of lower third of esophagus: Secondary | ICD-10-CM | POA: Diagnosis not present

## 2020-12-17 DIAGNOSIS — Z510 Encounter for antineoplastic radiation therapy: Secondary | ICD-10-CM | POA: Diagnosis not present

## 2020-12-17 DIAGNOSIS — C787 Secondary malignant neoplasm of liver and intrahepatic bile duct: Secondary | ICD-10-CM | POA: Diagnosis not present

## 2020-12-18 ENCOUNTER — Ambulatory Visit
Admission: RE | Admit: 2020-12-18 | Discharge: 2020-12-18 | Disposition: A | Discharge status: Home / Self Care | Payer: Medicare Other | Source: Ambulatory Visit | Attending: Radiation Oncology | Admitting: Radiation Oncology

## 2020-12-18 ENCOUNTER — Other Ambulatory Visit: Payer: Self-pay

## 2020-12-18 ENCOUNTER — Telehealth: Payer: Self-pay

## 2020-12-18 DIAGNOSIS — Z510 Encounter for antineoplastic radiation therapy: Secondary | ICD-10-CM | POA: Diagnosis not present

## 2020-12-18 DIAGNOSIS — C155 Malignant neoplasm of lower third of esophagus: Secondary | ICD-10-CM | POA: Diagnosis not present

## 2020-12-18 DIAGNOSIS — C787 Secondary malignant neoplasm of liver and intrahepatic bile duct: Secondary | ICD-10-CM | POA: Diagnosis not present

## 2020-12-18 NOTE — Telephone Encounter (Signed)
(  3:30 pm) SW scheduled initial palliative care visit for 01/08/21 @ 12:30 pm.

## 2020-12-19 ENCOUNTER — Ambulatory Visit
Admission: RE | Admit: 2020-12-19 | Discharge: 2020-12-19 | Disposition: A | Discharge status: Home / Self Care | Payer: Medicare Other | Source: Ambulatory Visit | Attending: Radiation Oncology | Admitting: Radiation Oncology

## 2020-12-19 DIAGNOSIS — Z510 Encounter for antineoplastic radiation therapy: Secondary | ICD-10-CM | POA: Diagnosis not present

## 2020-12-19 DIAGNOSIS — C155 Malignant neoplasm of lower third of esophagus: Secondary | ICD-10-CM | POA: Diagnosis not present

## 2020-12-19 DIAGNOSIS — C787 Secondary malignant neoplasm of liver and intrahepatic bile duct: Secondary | ICD-10-CM | POA: Diagnosis not present

## 2020-12-20 ENCOUNTER — Ambulatory Visit
Admission: RE | Admit: 2020-12-20 | Discharge: 2020-12-20 | Disposition: A | Discharge status: Home / Self Care | Payer: Medicare Other | Source: Ambulatory Visit | Attending: Radiation Oncology | Admitting: Radiation Oncology

## 2020-12-20 ENCOUNTER — Other Ambulatory Visit: Payer: Self-pay

## 2020-12-20 ENCOUNTER — Telehealth: Payer: Self-pay | Admitting: Dietician

## 2020-12-20 ENCOUNTER — Inpatient Hospital Stay: Payer: Medicare Other | Admitting: Dietician

## 2020-12-20 DIAGNOSIS — C787 Secondary malignant neoplasm of liver and intrahepatic bile duct: Secondary | ICD-10-CM | POA: Diagnosis not present

## 2020-12-20 DIAGNOSIS — Z510 Encounter for antineoplastic radiation therapy: Secondary | ICD-10-CM | POA: Diagnosis not present

## 2020-12-20 DIAGNOSIS — C155 Malignant neoplasm of lower third of esophagus: Secondary | ICD-10-CM | POA: Diagnosis not present

## 2020-12-20 NOTE — Telephone Encounter (Signed)
Nutrition Follow-up:  Patient currently receiving adjuvant radiation therapy for esophageal cancer followed by Xeloda/Nivo.  Spoke with patient via telephone, reports he is enjoying a smoothie this afternoon. He reports tolerating radiation therapy well, he denies nutrition impact symptoms other than dry mouth overnight. Patient reports regular bowel movements, he is taking 2 stool softeners daily. He is drinking 2-3 Boost VHC and 2 [employer] Smoothies. Daughters are using emersion blender to prepare soups for patient which he usually has in the evenings. Patient reports Dr. Lisbeth Renshaw has encouraged patient to advance diet, suggested pt try chicken/rice soups and scrambled eggs. He is looking forward eating more variety of foods, says he craves savory foods after drinking all of the sweet supplements. Patient is looking forward to visiting with a friend this weekend, planning to sit on the porch and catch up.   Medications: reviewed  Labs: 9/12 - Glucose 113  Anthropometrics: Weight 164.2 lb today stable. Patient weighed 164.8 on 9/16 and 166 lb on 9/1   NUTRITION DIAGNOSIS: Inadequate oral intake appears improved    INTERVENTION:  Continue drinking 2-3 Boost VHC daily Discussed soft, moist high protein foods - will mail handout and recipes Discussed strategies for dry mouth - will mail handout with tips  Encouraged baking soda, salt water rinses several times daily - will mail recipe  Patient has contact imformation     MONITORING, EVALUATION, GOAL: weight trends, intake   NEXT VISIT: via telephone Friday October 7

## 2020-12-23 ENCOUNTER — Ambulatory Visit
Admission: RE | Admit: 2020-12-23 | Discharge: 2020-12-23 | Disposition: A | Discharge status: Home / Self Care | Payer: Medicare Other | Source: Ambulatory Visit | Attending: Radiation Oncology | Admitting: Radiation Oncology

## 2020-12-23 ENCOUNTER — Other Ambulatory Visit: Payer: Self-pay

## 2020-12-23 DIAGNOSIS — C787 Secondary malignant neoplasm of liver and intrahepatic bile duct: Secondary | ICD-10-CM | POA: Diagnosis not present

## 2020-12-23 DIAGNOSIS — Z510 Encounter for antineoplastic radiation therapy: Secondary | ICD-10-CM | POA: Diagnosis not present

## 2020-12-23 DIAGNOSIS — C155 Malignant neoplasm of lower third of esophagus: Secondary | ICD-10-CM | POA: Diagnosis not present

## 2020-12-23 DIAGNOSIS — Z23 Encounter for immunization: Secondary | ICD-10-CM | POA: Diagnosis not present

## 2020-12-24 ENCOUNTER — Ambulatory Visit
Admission: RE | Admit: 2020-12-24 | Discharge: 2020-12-24 | Disposition: A | Discharge status: Home / Self Care | Payer: Medicare Other | Source: Ambulatory Visit | Attending: Radiation Oncology | Admitting: Radiation Oncology

## 2020-12-24 DIAGNOSIS — C787 Secondary malignant neoplasm of liver and intrahepatic bile duct: Secondary | ICD-10-CM | POA: Diagnosis not present

## 2020-12-24 DIAGNOSIS — Z510 Encounter for antineoplastic radiation therapy: Secondary | ICD-10-CM | POA: Diagnosis not present

## 2020-12-24 DIAGNOSIS — C155 Malignant neoplasm of lower third of esophagus: Secondary | ICD-10-CM | POA: Diagnosis not present

## 2020-12-25 ENCOUNTER — Other Ambulatory Visit (HOSPITAL_COMMUNITY): Payer: Self-pay

## 2020-12-25 ENCOUNTER — Ambulatory Visit
Admission: RE | Admit: 2020-12-25 | Discharge: 2020-12-25 | Disposition: A | Discharge status: Home / Self Care | Payer: Medicare Other | Source: Ambulatory Visit | Attending: Radiation Oncology | Admitting: Radiation Oncology

## 2020-12-25 DIAGNOSIS — C155 Malignant neoplasm of lower third of esophagus: Secondary | ICD-10-CM | POA: Diagnosis not present

## 2020-12-25 DIAGNOSIS — C787 Secondary malignant neoplasm of liver and intrahepatic bile duct: Secondary | ICD-10-CM | POA: Diagnosis not present

## 2020-12-25 DIAGNOSIS — Z510 Encounter for antineoplastic radiation therapy: Secondary | ICD-10-CM | POA: Diagnosis not present

## 2020-12-25 NOTE — Telephone Encounter (Signed)
Oral Chemotherapy Pharmacist Encounter  I spoke with patient for overview of: Xeloda (capecitabine) for the treatment of metastatic esophageal cancer in conjunction with nivolumab, planned duration until disease progression or unacceptable drug toxicity.   Counseled patient on administration, dosing, side effects, monitoring, drug-food interactions, safe handling, storage, and disposal.  Patient will take Xeloda 500mg  tablets, 3 tablets (1500mg ) by mouth in AM and 3 tabs (1500mg ) by mouth in PM, within 30 minutes of finishing meals, for 7 days on, 7 days off, repeated every 14 days.  Discussed with patient that Xeloda may also be dissolved in water if he has difficulty swallowing pills. Directions for dissolving Xeloda placed in education material to be given to patient on 12/26/20.  Xeloda start date: currently pending; patient has OV with Dr. Burr Medico 12/26/20  Adverse effects include but are not limited to: fatigue, decreased blood counts, GI upset, diarrhea, mouth sores, and hand-foot syndrome.  Patient will obtain anti diarrheal and alert the office of 4 or more loose stools above baseline.  Reviewed with patient importance of keeping a medication schedule and plan for any missed doses. No barriers to medication adherence identified.  Medication reconciliation performed and medication/allergy list updated. Discussed with patient potential for decreased efficacy of Xeloda when used in combination with pantoprazole. Patient OK to hold pantoprazole while on Xeloda. Additionally discussed interactions between folic acid and Xeloda, and risk of increased side effects from Xeloda when taken with folic acid. Patient will also hold folic acid while on Xeloda.   First fill will be dispensed from the [facility]. Patient will pick this up on 12/26/20 after office visit at [facility].  Will proceed with applying for manufacturer assistance through [facility] for subsequent fills since patient's  insurance will not cover medication despite appeal for coverage attempt.  All questions answered.  Mr. Smestad voiced understanding and appreciation.   Medication education handout placed in mail for patient. Patient knows to call the office with questions or concerns. Oral Chemotherapy Clinic phone number provided to patient.   Leron Croak, PharmD, BCPS Hematology/Oncology Clinical Pharmacist [facility] [phone] 12/25/2020 12:50 PM

## 2020-12-26 ENCOUNTER — Other Ambulatory Visit (HOSPITAL_COMMUNITY): Payer: Self-pay

## 2020-12-26 ENCOUNTER — Inpatient Hospital Stay (HOSPITAL_BASED_OUTPATIENT_CLINIC_OR_DEPARTMENT_OTHER): Payer: Medicare Other | Admitting: Hematology

## 2020-12-26 ENCOUNTER — Ambulatory Visit
Admission: RE | Admit: 2020-12-26 | Discharge: 2020-12-26 | Disposition: A | Discharge status: Home / Self Care | Payer: Medicare Other | Source: Ambulatory Visit | Attending: Radiation Oncology | Admitting: Radiation Oncology

## 2020-12-26 ENCOUNTER — Other Ambulatory Visit: Payer: Self-pay

## 2020-12-26 ENCOUNTER — Ambulatory Visit: Payer: Medicare Other

## 2020-12-26 ENCOUNTER — Encounter: Payer: Self-pay | Admitting: Hematology

## 2020-12-26 VITALS — BP 121/57 | HR 87 | Temp 98.2°F | Resp 18 | Ht 71.0 in | Wt 164.7 lb

## 2020-12-26 DIAGNOSIS — Z510 Encounter for antineoplastic radiation therapy: Secondary | ICD-10-CM | POA: Diagnosis not present

## 2020-12-26 DIAGNOSIS — C155 Malignant neoplasm of lower third of esophagus: Secondary | ICD-10-CM | POA: Diagnosis not present

## 2020-12-26 DIAGNOSIS — C787 Secondary malignant neoplasm of liver and intrahepatic bile duct: Secondary | ICD-10-CM | POA: Diagnosis not present

## 2020-12-26 NOTE — Progress Notes (Addendum)
[name]   [phone] [phone]   Clinic Follow up Note   Patient Care Team: [name], [name], MD as PCP - General (Family Medicine) [name], MD as Consulting Physician (Gastroenterology) [name], MD as Consulting Physician (Hematology) [name], NP as Nurse Practitioner (Nurse Practitioner) [name], RN as Registered Nurse  Date of Service:  12/26/2020  CHIEF COMPLAINT: f/u of esophageal cancer  CURRENT THERAPY:  -Palliative radiation therapy -PENDING Xeloda and Nivolumab, to start 01/06/21  ASSESSMENT & PLAN:  Randy Wood is a 80 y.o. male with   1. Adenocarcinoma of the distal esophagus/GE junction, with liver and node metastasis, HER2-, MMR normal, PD-L1 3% -He presented with progressive dysphagia and weight loss, work-up showed moderate stenosis in the distal third of the esophagus invading the gastric cardia, path confirmed poorly differentiated adenocarcinoma.   -PET 12/03/20 showed: esophageal tumor with local perigastric infiltration, hepatic, and nodal metastases; nodal involvement both in chest and abdomen extending to lower retroperitoneum; burst fracture at L5 with increased metabolic activity. -Molecular testing showed MMR normal, PD-L1 CPS 3% -Given the evidence of metastatic disease, he is not a candidate for surgical resection. He is currently receiving palliative radiation therapy to primary tumor and will finish tomorrow. He is tolerating RT well overall  -he will start single agent Xeloda (oral), and nivolumab (IV) every 2 weeks on 01/06/21.  I again reviewed the potential side effect from Xeloda and unable especially fatigue, skin toxicity, pneumonitis endocrine disorder, etc and management, he voiced good understanding agrees to proceed.    2.  Dysphagia, odynophagia, and weight loss -Onset early 2020/05/21 after hospitalization for bacterial peritonitis, 40 lbs in 6 months  -in past few weeks he lost 15 lbs  due to severe dysphagia and odynophagia. His weight has been stable since that drop. -EGD showed numerous pills in middle/distal esophagus which were removed. -he is using nutrition supplements such as boost/Ensure, pured food, high caloric intake, and soft diet.    3.  Cirrhosis, abdominal pain -felt to be alcohol-related. He has abstained from alcohol since 05/21/20 -S/p paracentesis 05/09/2020 yielding 4.4 liters, path showed reactive mesothelial cells -Compensated, hepatic function panel 09/17/2020 WNL -followed by GI   4.Social support -He is widowed and lives alone, his wife died from [place] in 05-22-15, she was treated here -2 daughters live out of town, Randy Wood lives in [place] and is the closest, also his primary contact for healthcare -He has church community and some family friends who could help if needed -Patient is independent with ADLs but has a Secretary/administrator and yard service.  He is open to social work referral for transportation if needed, I referred him   5. Family history  -Patient's mother had ovarian cancer, father had prostate cancer, and daughter Randy Wood had breast cancer at age 8 -His daughter Randy Wood without cancer underwent genetic testing and is negative -He was referred to genetics and underwent testing on 12/09/20. Results are pending.     PLAN: -continue radiation, last dose tomorrow, 12/26/20 -lab and f/u on 01/06/21 to start Xeloda and Nivo, will start him on Xeloda 1576m bid for 7 days on and 7 days off    No problem-specific Assessment & Plan notes found for this encounter.   SUMMARY OF ONCOLOGIC HISTORY: Oncology History Overview Note  Cancer Staging No matching staging information was found for the patient.    Primary adenocarcinoma of distal third of esophagus ([place]  11/11/2020 Procedure   EGD  by Dr. Watt Climes - Normal larynx. -? Malignant-appearing esophageal stenosis. Biopsied. - Pills were found in the esophagus. Removal was successful. - Rule out  malignancy,? gastric tumor in the cardia difficult to say based on bleeding from passing the scope and unable to wash and suction for adequate visualization. - Normal ampulla, duodenal bulb, first portion of the duodenum, second portion of the duodenum, major papilla and area of the papilla. - The examination was otherwise normal.   11/11/2020 Initial Biopsy   FINAL MICROSCOPIC DIAGNOSIS:  A. ESOPHAGUS, DISTAL, BIOPSY:  -  Poorly differentiated adenocarcinoma  -  See comment    11/11/2020 Initial Diagnosis   Primary adenocarcinoma of distal third of esophagus ([place])   11/11/2020 Cancer Staging   Staging form: Esophagus - Adenocarcinoma, AJCC 8th Edition - Clinical stage from 11/11/2020: Stage IVB (cTX, cN1, cM1) - Signed by [name], MD on 12/26/2020   11/14/2020 Imaging   CT CAPIMPRESSION: 1. Aggressive appearing new tumor of the gastroesophageal junction confluent with the irregular collection of right gastric lymph nodes, and with new associated metastatic retroperitoneal adenopathy. Small paraesophageal lymph nodes in the thorax are increased in size from prior exam and could also be involved, and there is a nonspecific 1.0 cm lesion inferiorly in the right hepatic lobe which could potentially be metastatic. 2. New subacute burst (AO type A4) fracture of L5, with mild posterior bony retropulsion and with a dominant coronally oriented fracture plane, and about 50% loss of height. Correlate with interval trauma. 3. Slight thickening along the left paracolic gutter and trace pelvic ascites, although the ascites is markedly reduced compared to 05/04/2020. 4. Other imaging findings of potential clinical significance: Aortic Atherosclerosis (ICD10-I70.0). Coronary atherosclerosis. Old compression fracture at L2.   12/03/2020 PET scan   IMPRESSION: Tumor at the GE junction with local perigastric infiltration, hepatic and nodal metastases.   Nodal involvement both in the chest and  abdomen extending to the lower retroperitoneum in the abdomen.   Burst fracture at L5 shows signs of increased metabolic activity, nonspecific in the setting of fracture, in light of other findings would correlate with any history of trauma, if no history of trauma consider MRI to exclude the possibility of pathologic fracture.   12/31/2020 -  Chemotherapy    Patient is on Treatment Plan: GASTROESOPHAGEAL NIVOLUMAB Q14D X 8 CYCLES / NIVOLUMAB Q28D          INTERVAL HISTORY:  CARLOS HEBER is here for a follow up of esophageal cancer. He was last seen by me on 12/10/20. He presents to the clinic accompanied by his daughter, Randy Wood, who lives in [place]. He reports he developed some trouble swallowing from radiation.   All other systems were reviewed with the patient and are negative.  MEDICAL HISTORY:  Past Medical History:  Diagnosis Date   AKI (acute kidney injury) ([place])    Alcohol abuse    Arthritis    Cataract 2006   Diarrhea    Edema leg    Esophageal cancer ([place])    Family history of breast cancer 12/09/2020   Family history of ovarian cancer 12/09/2020   Family history of prostate cancer 12/09/2020   GERD (gastroesophageal reflux disease)    Glaucoma unknown   Hyperbilirubinemia    Hyperlipemia    Hypertension    Hypokalemia    Liver failure (HCC)    Macular degeneration, wet (HCC)    Microcytic anemia    Osteoarthritis    SOB (shortness of breath)  Weight gain     SURGICAL HISTORY: Past Surgical History:  Procedure Laterality Date   ANKLE SURGERY     BIOPSY  11/11/2020   Procedure: BIOPSY;  Surgeon: [name], MD;  Location: [facility];  Service: Gastroenterology;;   CATARACT EXTRACTION, BILATERAL     ESOPHAGOGASTRODUODENOSCOPY (EGD) WITH PROPOFOL N/A 11/11/2020   Procedure: ESOPHAGOGASTRODUODENOSCOPY (EGD) WITH PROPOFOL;  Surgeon: [name], MD;  Location: [facility];  Service: Gastroenterology;  Laterality: N/A;   EYE SURGERY  2006    FOREIGN BODY REMOVAL  11/11/2020   Procedure: FOREIGN BODY REMOVAL;  Surgeon: [name], MD;  Location: [facility];  Service: Gastroenterology;;   IR PARACENTESIS  05/09/2020   TONSILLECTOMY      I have reviewed the social history and family history with the patient and they are unchanged from previous note.  ALLERGIES:  is allergic to lisinopril.  MEDICATIONS:  Current Outpatient Medications  Medication Sig Dispense Refill   brimonidine (ALPHAGAN) 0.2 % ophthalmic solution Place 1 drop into the left eye 3 (three) times daily.     capecitabine (XELODA) 500 MG tablet Take 3 tabs in the morning and evening, 30 minutes after meal,  for 7 days on and 7 days off 84 tablet 0   cetirizine (ZYRTEC) 10 MG tablet Take 10 mg by mouth daily.     dorzolamide-timolol (COSOPT) 22.3-6.8 MG/ML ophthalmic solution Place 1 drop into the left eye 2 (two) times daily.     EPINEPHrine 0.3 mg/0.3 mL IJ SOAJ injection Inject 0.3 mg into the muscle as needed for anaphylaxis.     ezetimibe (ZETIA) 10 MG tablet Take 1 tablet (10 mg total) by mouth daily. 90 tablet 3   fluticasone (FLONASE) 50 MCG/ACT nasal spray Place 1 spray into both nostrils daily as needed for allergies.     furosemide (LASIX) 40 MG tablet Take 1 tablet (40 mg total) by mouth daily. 30 tablet 3   GLUCOSAMINE-CHONDROITIN PO Take 1 capsule by mouth daily.     lactulose (CHRONULAC) 10 GM/15ML solution Take 15 mLs (10 g total) by mouth daily as needed for mild constipation. 236 mL 0   latanoprost (XALATAN) 0.005 % ophthalmic solution Place 1 drop into the left eye daily.     Multiple Vitamins-Minerals (ONE-A-DAY MENS 50+) TABS Take 1 tablet by mouth daily.     Multiple Vitamins-Minerals (PRESERVISION AREDS 2 PO) Take 1 tablet by mouth 2 (two) times daily.     Netarsudil Dimesylate (RHOPRESSA) 0.02 % SOLN Place 1 drop into the left eye daily.     pantoprazole (PROTONIX) 40 MG tablet Take 40 mg by mouth daily.     potassium chloride 20 MEQ/15ML  (10%) SOLN Take 15 mLs (20 mEq total) by mouth 2 (two) times daily. 473 mL 2   spironolactone (ALDACTONE) 50 MG tablet Take 50 mg by mouth daily.     sucralfate (CARAFATE) 1 g tablet Take 1 tablet (1 g total) by mouth 4 (four) times daily. Dissolve each tablet in 15 cc water before use. 120 tablet 2   No current facility-administered medications for this visit.    PHYSICAL EXAMINATION: ECOG PERFORMANCE STATUS: 1 - Symptomatic but completely ambulatory  Vitals:   12/26/20 1031  BP: (!) 121/57  Pulse: 87  Resp: 18  Temp: 98.2 F (36.8 C)  SpO2: 100%   Wt Readings from Last 3 Encounters:  12/26/20 164 lb 11.2 oz (74.7 kg)  12/12/20 163 lb 3.2 oz (74 kg)  12/10/20 166 lb (75.3 kg)  GENERAL:alert, no distress and comfortable SKIN: skin color normal, no rashes or significant lesions EYES: normal, Conjunctiva are pink and non-injected, sclera clear  NEURO: alert & oriented x 3 with fluent speech  LABORATORY DATA:  I have reviewed the data as listed CBC Latest Ref Rng & Units 12/09/2020 11/25/2020 09/17/2020  WBC 4.0 - 10.5 K/uL 7.4 8.5 6.5  Hemoglobin 13.0 - 17.0 g/dL 11.7(L) 12.6(L) 12.4(L)  Hematocrit 39.0 - 52.0 % 35.7(L) 37.1(L) 38.2(L)  Platelets 150 - 400 K/uL 232 232 211     CMP Latest Ref Rng & Units 12/09/2020 11/25/2020 09/17/2020  Glucose 70 - 99 mg/dL 113(H) 114(H) 141(H)  BUN 8 - 23 mg/dL _0 Creatinine 0.61 - 1.24 mg/dL 1.14 1.09 0.99  Sodium 135 - 145 mmol/L 139 141 138  Potassium 3.5 - 5.1 mmol/L 4.4 3.1(L) 3.6  Chloride 98 - 111 mmol/L 104 98 100  CO2 22 - 32 mmol/L _1 Calcium 8.9 - 10.3 mg/dL 9.8 9.6 9.3  Total Protein 6.5 - 8.1 g/dL 7.3 7.6 6.7  Total Bilirubin 0.3 - 1.2 mg/dL 0.6 0.8 0.5  Alkaline Phos 38 - 126 U/L 90 97 -  AST 15 - 41 U/L _2 ALT 0 - 44 U/L _3 RADIOGRAPHIC STUDIES: I have personally reviewed the radiological images as listed and agreed with the findings in the report. No results found.    No orders  of the defined types were placed in this encounter.  All questions were answered. The patient knows to call the clinic with any problems, questions or concerns. No barriers to learning was detected. The total time spent in the appointment was 30 minutes.     [name], MD 12/26/2020   I, Wilburn Mylar, am acting as scribe for [name], MD.   I have reviewed the above documentation for accuracy and completeness, and I agree with the above.

## 2020-12-26 NOTE — Telephone Encounter (Signed)
Oral Oncology Patient Advocate Encounter  Received notification from Medicare that the request for prior authorization for Xeloda has been denied due to no medicare B coverage.     Attempting manufacturer assistance.  [place] Patient [place] Phone [phone] Fax [phone] 12/26/2020 10:11 AM

## 2020-12-27 ENCOUNTER — Ambulatory Visit
Admission: RE | Admit: 2020-12-27 | Discharge: 2020-12-27 | Disposition: A | Discharge status: Home / Self Care | Payer: Medicare Other | Source: Ambulatory Visit | Attending: Radiation Oncology | Admitting: Radiation Oncology

## 2020-12-27 ENCOUNTER — Encounter: Payer: Self-pay | Admitting: Radiation Oncology

## 2020-12-27 ENCOUNTER — Telehealth: Payer: Self-pay

## 2020-12-27 ENCOUNTER — Ambulatory Visit: Payer: Medicare Other

## 2020-12-27 DIAGNOSIS — C787 Secondary malignant neoplasm of liver and intrahepatic bile duct: Secondary | ICD-10-CM | POA: Diagnosis not present

## 2020-12-27 DIAGNOSIS — Z510 Encounter for antineoplastic radiation therapy: Secondary | ICD-10-CM | POA: Diagnosis not present

## 2020-12-27 DIAGNOSIS — C155 Malignant neoplasm of lower third of esophagus: Secondary | ICD-10-CM | POA: Diagnosis not present

## 2020-12-27 NOTE — Telephone Encounter (Signed)
Oral Oncology Patient Advocate Encounter  Met patient in lobby room to complete application for [place] in an effort to reduce patient's out of pocket expense for Xeloda to $0.    Application completed and faxed to [phone].   [employer] patient assistance phone number for follow up is [phone].   This encounter will be updated until final determination.   [place] Patient [place] Phone [phone] Fax [phone] 12/27/2020 7:42 AM

## 2020-12-30 ENCOUNTER — Ambulatory Visit: Payer: Self-pay | Admitting: Genetic Counselor

## 2020-12-30 ENCOUNTER — Ambulatory Visit: Payer: Medicare Other

## 2020-12-30 ENCOUNTER — Encounter: Payer: Self-pay | Admitting: Genetic Counselor

## 2020-12-30 ENCOUNTER — Telehealth: Payer: Self-pay | Admitting: Genetic Counselor

## 2020-12-30 DIAGNOSIS — Z1379 Encounter for other screening for genetic and chromosomal anomalies: Secondary | ICD-10-CM

## 2020-12-30 DIAGNOSIS — C155 Malignant neoplasm of lower third of esophagus: Secondary | ICD-10-CM

## 2020-12-30 DIAGNOSIS — Z8042 Family history of malignant neoplasm of prostate: Secondary | ICD-10-CM

## 2020-12-30 DIAGNOSIS — Z8041 Family history of malignant neoplasm of ovary: Secondary | ICD-10-CM

## 2020-12-30 DIAGNOSIS — Z803 Family history of malignant neoplasm of breast: Secondary | ICD-10-CM

## 2020-12-30 NOTE — Progress Notes (Signed)
HPI:  Randy Wood was previously seen in the [facility] due to a family history of cancer and concerns regarding a hereditary predisposition to cancer. Please refer to our prior cancer genetics clinic note for more information regarding our discussion, assessment and recommendations, at the time. Randy Wood's recent genetic test results were disclosed to him, as were recommendations warranted by these results. These results and recommendations are discussed in more detail below.  CANCER HISTORY:  Oncology History Overview Note  Cancer Staging No matching staging information was found for the patient.    Primary adenocarcinoma of distal third of esophagus ([place])  11/11/2020 Procedure   EGD by Dr. Watt Climes - Normal larynx. -? Malignant-appearing esophageal stenosis. Biopsied. - Pills were found in the esophagus. Removal was successful. - Rule out malignancy,? gastric tumor in the cardia difficult to say based on bleeding from passing the scope and unable to wash and suction for adequate visualization. - Normal ampulla, duodenal bulb, first portion of the duodenum, second portion of the duodenum, major papilla and area of the papilla. - The examination was otherwise normal.   11/11/2020 Initial Biopsy   FINAL MICROSCOPIC DIAGNOSIS:  A. ESOPHAGUS, DISTAL, BIOPSY:  -  Poorly differentiated adenocarcinoma  -  See comment    11/11/2020 Initial Diagnosis   Primary adenocarcinoma of distal third of esophagus ([place])   11/11/2020 Cancer Staging   Staging form: Esophagus - Adenocarcinoma, AJCC 8th Edition - Clinical stage from 11/11/2020: Stage IVB (cTX, cN1, cM1) - Signed by [name], MD on 12/26/2020   11/14/2020 Imaging   CT CAPIMPRESSION: 1. Aggressive appearing new tumor of the gastroesophageal junction confluent with the irregular collection of right gastric lymph nodes, and with new associated metastatic retroperitoneal adenopathy. Small paraesophageal lymph nodes in  the thorax are increased in size from prior exam and could also be involved, and there is a nonspecific 1.0 cm lesion inferiorly in the right hepatic lobe which could potentially be metastatic. 2. New subacute burst (AO type A4) fracture of L5, with mild posterior bony retropulsion and with a dominant coronally oriented fracture plane, and about 50% loss of height. Correlate with interval trauma. 3. Slight thickening along the left paracolic gutter and trace pelvic ascites, although the ascites is markedly reduced compared to 05/04/2020. 4. Other imaging findings of potential clinical significance: Aortic Atherosclerosis (ICD10-I70.0). Coronary atherosclerosis. Old compression fracture at L2.   12/03/2020 PET scan   IMPRESSION: Tumor at the GE junction with local perigastric infiltration, hepatic and nodal metastases.   Nodal involvement both in the chest and abdomen extending to the lower retroperitoneum in the abdomen.   Burst fracture at L5 shows signs of increased metabolic activity, nonspecific in the setting of fracture, in light of other findings would correlate with any history of trauma, if no history of trauma consider MRI to exclude the possibility of pathologic fracture.   12/27/2020 Genetic Testing   Negative hereditary cancer genetic testing: no pathogenic variants detected in [facility] Multi-Cancer +RNA Panel.  The report date is December 27, 2020.   The Multi-Cancer + RNA Panel offered by [facility] includes sequencing and/or deletion/duplication analysis of the following 84 genes:  AIP*, ALK, APC*, ATM*, AXIN2*, BAP1*, BARD1*, BLM*, BMPR1A*, BRCA1*, BRCA2*, BRIP1*, CASR, CDC73*, CDH1*, CDK4, CDKN1B*, CDKN1C*, CDKN2A, CEBPA, CHEK2*, CTNNA1*, DICER1*, DIS3L2*, EGFR, EPCAM, FH*, FLCN*, GATA2*, GPC3, GREM1, HOXB13, HRAS, KIT, MAX*, MEN1*, MET, MITF, MLH1*, MSH2*, MSH3*, MSH6*, MUTYH*, NBN*, NF1*, NF2*, NTHL1*, PALB2*, PDGFRA, PHOX2B, PMS2*, POLD1*, POLE*, POT1*, PRKAR1A*, PTCH1*,  PTEN*, RAD50*, RAD51C*, RAD51D*, RB1*, RECQL4, RET, RUNX1*, SDHA*, SDHAF2*, SDHB*, SDHC*, SDHD*, SMAD4*, SMARCA4*, SMARCB1*, SMARCE1*, STK11*, SUFU*, TERC, TERT, TMEM127*, Tp53*, TSC1*, TSC2*, VHL*, WRN*, and WT1.  RNA analysis is performed for * genes.   12/31/2020 -  Chemotherapy    Patient is on Treatment Plan: GASTROESOPHAGEAL NIVOLUMAB Q14D X 8 CYCLES / NIVOLUMAB Q28D         FAMILY HISTORY:  We obtained a detailed, 4-generation family history.  Significant diagnoses are listed below: Randy Wood is unaware of previous family history of genetic testing for hereditary cancer risks besides that mentioned above. Patient's ancestors are of English/Western European descent. There is no reported Ashkenazi Jewish ancestry. There is no known consanguinity.   GENETIC TEST RESULTS: Genetic testing reported out on December 27, 2020. The [facility] Multi-Cancer +RNA Panel found no pathogenic mutations. The Multi-Cancer + RNA Panel offered by [facility] includes sequencing and/or deletion/duplication analysis of the following 84 genes:  AIP*, ALK, APC*, ATM*, AXIN2*, BAP1*, BARD1*, BLM*, BMPR1A*, BRCA1*, BRCA2*, BRIP1*, CASR, CDC73*, CDH1*, CDK4, CDKN1B*, CDKN1C*, CDKN2A, CEBPA, CHEK2*, CTNNA1*, DICER1*, DIS3L2*, EGFR, EPCAM, FH*, FLCN*, GATA2*, GPC3, GREM1, HOXB13, HRAS, KIT, MAX*, MEN1*, MET, MITF, MLH1*, MSH2*, MSH3*, MSH6*, MUTYH*, NBN*, NF1*, NF2*, NTHL1*, PALB2*, PDGFRA, PHOX2B, PMS2*, POLD1*, POLE*, POT1*, PRKAR1A*, PTCH1*, PTEN*, RAD50*, RAD51C*, RAD51D*, RB1*, RECQL4, RET, RUNX1*, SDHA*, SDHAF2*, SDHB*, SDHC*, SDHD*, SMAD4*, SMARCA4*, SMARCB1*, SMARCE1*, STK11*, SUFU*, TERC, TERT, TMEM127*, Tp53*, TSC1*, TSC2*, VHL*, WRN*, and WT1.  RNA analysis is performed for * genes.  The test report has been scanned into EPIC and is located under the Molecular Pathology section of the Results Review tab.  A portion of the result report is included below for reference.      We discussed with Randy Wood that  because current genetic testing is not perfect, it is possible there may be a gene mutation in one of these genes that current testing cannot detect, but that chance is small.  We also discussed, that there could be another gene that has not yet been discovered, or that we have not yet tested, that is responsible for the cancer diagnoses in the family. It is also possible there is a hereditary cause for the cancer in the family that Randy Wood did not inherit and therefore was not identified in his testing.  Therefore, it is important to remain in touch with cancer genetics in the future so that we can continue to offer Randy Wood the most up to date genetic testing.   ADDITIONAL GENETIC TESTING: We discussed with Randy Wood that his genetic testing was fairly extensive.  If there are genes identified to increase cancer risk that can be analyzed in the future, we would be happy to discuss and coordinate this testing at that time.    CANCER SCREENING RECOMMENDATIONS: Randy Wood test result is considered negative (normal).  This means that we have not identified a hereditary cause for his family history of cancer at this time.   While reassuring, this does not definitively rule out a hereditary predisposition to cancer. It is still possible that there could be genetic mutations that are undetectable by current technology. There could be genetic mutations in genes that have not been tested or identified to increase cancer risk.  Therefore, it is recommended he continue to follow the cancer management and screening guidelines provided by his oncology and primary healthcare provider.   An individual's cancer risk and medical management are not determined by genetic test results alone. Overall cancer risk assessment  incorporates additional factors, including personal medical history, family history, and any available genetic information that may result in a personalized plan for cancer prevention and  surveillance  RECOMMENDATIONS FOR FAMILY MEMBERS:  Individuals in this family might be at some increased risk of developing cancer, over the general population risk, simply due to the family history of cancer.  We recommended women in this family have a yearly mammogram beginning at age 79, or 81 years younger than the earliest onset of cancer, an annual clinical breast exam, and perform monthly breast self-exams. Women in this family should also have a gynecological exam as recommended by their primary provider. Family members should be referred for colonoscopy starting at age 59, or earlier, as recommended by their providers.    It is also possible there is a hereditary cause for the cancer in Randy Wood family that he did not inherit and therefore was not identified in him.  Based on Randy Wood family history of ovarian cancer in his deceased mother, we recommended his brother, have genetic counseling and testing. Randy Wood will let us know if we can be of any assistance in coordinating genetic counseling and/or testing for this family member.   FOLLOW-UP: Lastly, we discussed with Randy Wood that cancer genetics is a rapidly advancing field and it is possible that new genetic tests will be appropriate for him and/or his family members in the future. We encouraged him to remain in contact with cancer genetics on an annual basis so we can update his personal and family histories and let him know of advances in cancer genetics that may benefit this family.   Our contact number was provided. Randy Wood's questions were answered to his satisfaction, and he knows he is welcome to call us at anytime with additional questions or concerns.     Karl Knarr M. Joette Catching, [place], [facility] Genetic Counselor [email or web address] (P) [phone]

## 2020-12-30 NOTE — Telephone Encounter (Signed)
Revealed negative genetic testing.  Discussed that we do not know why there is cancer in the family. It could be sporadic/familial, due to a change in a gene he did not inherit, due to a different gene that we are not testing, or maybe our current technology may not be able to pick something up.  It will be important for him to keep in contact with genetics to keep up with whether additional testing may be needed.

## 2021-01-01 NOTE — Telephone Encounter (Signed)
Patient is approved for Xeloda at no cost from [employer] 12/31/20-until.  [place] Patient [place] Phone [phone] Fax [phone] 01/01/2021 9:00 AM

## 2021-01-03 ENCOUNTER — Telehealth: Payer: Self-pay | Admitting: Dietician

## 2021-01-03 ENCOUNTER — Inpatient Hospital Stay: Payer: Medicare Other | Attending: Nurse Practitioner | Admitting: Dietician

## 2021-01-03 ENCOUNTER — Other Ambulatory Visit: Payer: Self-pay

## 2021-01-03 DIAGNOSIS — C155 Malignant neoplasm of lower third of esophagus: Secondary | ICD-10-CM

## 2021-01-03 DIAGNOSIS — K7460 Unspecified cirrhosis of liver: Secondary | ICD-10-CM | POA: Insufficient documentation

## 2021-01-03 DIAGNOSIS — Z79899 Other long term (current) drug therapy: Secondary | ICD-10-CM | POA: Insufficient documentation

## 2021-01-03 DIAGNOSIS — C787 Secondary malignant neoplasm of liver and intrahepatic bile duct: Secondary | ICD-10-CM | POA: Insufficient documentation

## 2021-01-03 DIAGNOSIS — Z5111 Encounter for antineoplastic chemotherapy: Secondary | ICD-10-CM | POA: Insufficient documentation

## 2021-01-03 NOTE — Telephone Encounter (Signed)
Nutrition Follow-up:  Patient has completed adjuvant radiation therapy for esophageal cancer. He is pending chemotherapy, plans to start Xeloda and Nivo 10/10.  Spoke with patient via telephone. He reports swallowing has been "particularly troublesome over the past week." He endorses increased pain with swallowing, unable to tolerate boathouse smoothies and tomato soup as he previously did. Patient reports his is able to swallow soft, smooth, creamy foods without discomfort. Patient has not been drinking Boost VHC, says they are too thick. He is drinking a couple of Ensure Complete or Boost High Protein during the day, eating creamy soups in the evenings. He has been eating 100 calorie pudding cups or sometimes ice cream for dessert. Patient enjoys [employer], says this tastes wonderful. Patient reports taking Carafate 4x/day ~30 minutes before meals, sometimes this increases mucous. He is drinking some cranberry juice, 3 bottles of water and keeps water by his bed that he sips on through the night. Patient denies constipation, diarrhea, vomiting, reports occasional nausea due to thick mucous.   Medications: reviewed  Labs: reviewed  Anthropometrics: Weight 164 lb 11.2 oz on 9/29 stable. Patient weighed 164.2 lb on 9/23 and 164.8 on 9/16   NUTRITION DIAGNOSIS: Inadequate oral intake ongoing   INTERVENTION:  Encouraged soft smooth textures that are easy to swallow and high in protein - pt has handout and recipes Continue drinking Ensure Complete/equivalent, recommend patient increase to 4/day - will mail coupons Suggested trying Boost VHC mixed with ice cream for high calorie/high protein shake Reviewed strategies for thick salvia/dry mouth - pt has handout  Patient has contact information     MONITORING, EVALUATION, GOAL: weight trends, intake   NEXT VISIT: Wednesday October 26 during infusion

## 2021-01-04 ENCOUNTER — Encounter (HOSPITAL_BASED_OUTPATIENT_CLINIC_OR_DEPARTMENT_OTHER): Payer: Self-pay | Admitting: Family Medicine

## 2021-01-06 ENCOUNTER — Inpatient Hospital Stay: Payer: Medicare Other

## 2021-01-06 ENCOUNTER — Other Ambulatory Visit: Payer: Self-pay

## 2021-01-06 ENCOUNTER — Encounter: Payer: Self-pay | Admitting: Hematology

## 2021-01-06 ENCOUNTER — Inpatient Hospital Stay (HOSPITAL_BASED_OUTPATIENT_CLINIC_OR_DEPARTMENT_OTHER): Payer: Medicare Other | Admitting: Hematology

## 2021-01-06 VITALS — BP 116/61 | HR 92 | Temp 98.2°F | Resp 18 | Ht 71.0 in | Wt 158.6 lb

## 2021-01-06 DIAGNOSIS — C787 Secondary malignant neoplasm of liver and intrahepatic bile duct: Secondary | ICD-10-CM | POA: Diagnosis not present

## 2021-01-06 DIAGNOSIS — Z79899 Other long term (current) drug therapy: Secondary | ICD-10-CM | POA: Diagnosis not present

## 2021-01-06 DIAGNOSIS — E876 Hypokalemia: Secondary | ICD-10-CM | POA: Diagnosis not present

## 2021-01-06 DIAGNOSIS — C155 Malignant neoplasm of lower third of esophagus: Secondary | ICD-10-CM

## 2021-01-06 DIAGNOSIS — Z5111 Encounter for antineoplastic chemotherapy: Secondary | ICD-10-CM | POA: Diagnosis not present

## 2021-01-06 DIAGNOSIS — K7460 Unspecified cirrhosis of liver: Secondary | ICD-10-CM | POA: Diagnosis not present

## 2021-01-06 LAB — IRON AND TIBC
Iron: 44 ug/dL (ref 42–163)
Saturation Ratios: 15 % — ABNORMAL LOW (ref 20–55)
TIBC: 293 ug/dL (ref 202–409)
UIBC: 248 ug/dL (ref 117–376)

## 2021-01-06 LAB — COMPREHENSIVE METABOLIC PANEL
ALT: 11 U/L (ref 0–44)
AST: 21 U/L (ref 15–41)
Albumin: 3.2 g/dL — ABNORMAL LOW (ref 3.5–5.0)
Alkaline Phosphatase: 66 U/L (ref 38–126)
Anion gap: 13 (ref 5–15)
BUN: 32 mg/dL — ABNORMAL HIGH (ref 8–23)
CO2: 27 mmol/L (ref 22–32)
Calcium: 9.2 mg/dL (ref 8.9–10.3)
Chloride: 97 mmol/L — ABNORMAL LOW (ref 98–111)
Creatinine, Ser: 1.14 mg/dL (ref 0.61–1.24)
GFR, Estimated: >60 mL/min (ref >60)
Glucose, Bld: 206 mg/dL — ABNORMAL HIGH (ref 70–99)
Potassium: 2.5 mmol/L — CL (ref 3.5–5.1)
Sodium: 137 mmol/L (ref 135–145)
Total Bilirubin: 0.5 mg/dL (ref 0.3–1.2)
Total Protein: 7.1 g/dL (ref 6.5–8.1)

## 2021-01-06 LAB — CBC WITH DIFFERENTIAL/PLATELET
Abs Immature Granulocytes: 0.02 10*3/uL (ref 0.00–0.07)
Basophils Absolute: 0 10*3/uL (ref 0.0–0.1)
Basophils Relative: 0 %
Eosinophils Absolute: 0.1 10*3/uL (ref 0.0–0.5)
Eosinophils Relative: 2 %
HCT: 35.2 % — ABNORMAL LOW (ref 39.0–52.0)
Hemoglobin: 11.6 g/dL — ABNORMAL LOW (ref 13.0–17.0)
Immature Granulocytes: 0 %
Lymphocytes Relative: 10 %
Lymphs Abs: 0.6 10*3/uL — ABNORMAL LOW (ref 0.7–4.0)
MCH: 29.4 pg (ref 26.0–34.0)
MCHC: 33 g/dL (ref 30.0–36.0)
MCV: 89.3 fL (ref 80.0–100.0)
Monocytes Absolute: 0.5 10*3/uL (ref 0.1–1.0)
Monocytes Relative: 8 %
Neutro Abs: 4.5 10*3/uL (ref 1.7–7.7)
Neutrophils Relative %: 80 %
Platelets: 150 10*3/uL (ref 150–400)
RBC: 3.94 MIL/uL — ABNORMAL LOW (ref 4.22–5.81)
RDW: 13.2 % (ref 11.5–15.5)
WBC: 5.7 10*3/uL (ref 4.0–10.5)
nRBC: 0 % (ref 0.0–0.2)

## 2021-01-06 LAB — FERRITIN: Ferritin: 73 ng/mL (ref 24–336)

## 2021-01-06 MED ORDER — FUROSEMIDE 40 MG PO TABS
40.0000 mg | ORAL_TABLET | Freq: Every day | ORAL | 0 refills | Status: DC
Start: 2021-01-06 — End: 2021-01-22

## 2021-01-06 MED ORDER — POTASSIUM CHLORIDE CRYS ER 20 MEQ PO TBCR: EXTENDED_RELEASE_TABLET | ORAL | 5 refills | Status: DC

## 2021-01-06 NOTE — Progress Notes (Signed)
Spoke w/pt via telephone regarding his lab results today K+ 2.5.  Verbal order from Dr. Burr Medico to have pt to increase K+ today (01/06/2021) to 63mEq today only and tomorrow take 49mEq PO.  Dr. Burr Medico stated pt can take K+ tablets or take Liquid K+.  Pt already has order for liquid K+.  Dr. Burr Medico also stated pt will get 71mEq IV of K+ while in infusion on 01/06/2021 along with Nivolumab.  Dr. Burr Medico added 27mEq K+ IV to pt's treatment plan for 01/07/2021.  Randy Wood stated that Dr. Lisbeth Renshaw had instructed him to stop taking the liquid K+ d/t his esophagheal cancer.  Pt described when he takes his liquid K+ his throat would burn for several days; thus why Dr. Lisbeth Renshaw instructed him to stop taking the K+.  Educated pt as to why he's getting K+ supplement.  Pt takes 40mg  of Furosemide daily which causes the pt to lose large amount of K+.  Pt verbalized understanding of teaching.  Informed pt that he will get IV K+ on 01/07/2021 along with his Nivolumab.  Notified infusion Charge Nurse Threasa Beards) of the change in pt's treatment on 01/07/2021.  Also, recommended to pt that we can order K+ in tablet form that can be taken with applesauce or yogurt so he could have less throat irritation.  Pt wants to try taking the liquid right now with applesauce or yogurt.  Ordered tablet K+ just in case liquid K+ still irritates pt's throat.

## 2021-01-06 NOTE — Progress Notes (Signed)
[facility]   [phone] [phone]   Clinic Follow up Note   Patient Care Team: [name], [name], MD as PCP - General (Family Medicine) [name], MD as Consulting Physician (Gastroenterology) [name], MD as Consulting Physician (Hematology) [name], NP as Nurse Practitioner (Nurse Practitioner) [name], RN as Registered Nurse  Date of Service:  01/06/2021  CHIEF COMPLAINT: f/u of esophageal cancer  CURRENT THERAPY:  -Xeloda 1523m q12h, 1 week on/1 week off, starting 01/06/21  -Nivolumab q2weeks, to start 01/07/21  ASSESSMENT & PLAN:  Randy SCHIPPERSis a 80y.o. male with   1. Randy Wood, with liver and node metastasis, HER2-, MMR normal, PD-L1 3% -He presented with progressive dysphagia and weight loss, work-up showed moderate stenosis in the distal third of the esophagus invading the gastric cardia, path 11/25/20 confirmed poorly differentiated Randy.   -PET 12/03/20 showed: esophageal tumor with local perigastric infiltration, hepatic, and nodal metastases; nodal involvement both in chest and abdomen extending to lower retroperitoneum; burst fracture at L5 with increased metabolic activity. -Molecular testing showed MMR normal, PD-L1 CPS 3% -He received palliative radiation therapy to primary tumor 9/15-9/30/22 -he will start single agent Xeloda (oral), and nivolumab (IV) every 2 weeks tomorrow. We will move appointments to Mondays for following treatments. -side effects reviewed again  -We also discussed extending Nivo treatment to every 4 weeks later on.   2.  Dysphagia, odynophagia, and weight loss -Onset early 2February 12, 2022after hospitalization for bacterial peritonitis, 40 lbs in 6 months  -in past few weeks he lost 15 lbs due to severe dysphagia and odynophagia. His weight has been stable since that drop. -EGD showed numerous pills in middle/distal esophagus which were  removed. -he is using nutrition supplements such as boost/Ensure, pured food, high caloric intake, and soft diet.    3.  Cirrhosis, abdominal pain -felt to be alcohol-related. He has abstained from alcohol since 04/2020 -S/p paracentesis 05/09/20 yielding 4.4 liters, path showed reactive mesothelial cells -Compensated, hepatic function panel 09/17/20 WNL -followed by GI   4.Social support -He is widowed and lives alone, his wife died from [place] 22017-02-12 she was treated here -2 daughters live out of town, [place], also his primary contact for healthcare -He has church community and some family friends who could help if needed -Patient is independent with ADLs but has a hSecretary/administratorand yard service.  He is open to social work referral for transportation if needed, I referred him   5. Family history  -Patient's mother had ovarian cancer, father had prostate cancer, and daughter Randy Wood breast cancer at age 81-His daughter Randy Lullwithout cancer underwent genetic testing and is negative -He was referred to genetics and underwent testing on 12/09/20. Results were negative.   6. Hypokalemia  -K 2.8 today, secondary to diuretics  -He is on liquid K, will double the dose and give 258m iv tomorrow    PLAN: -he started Xeloda, 1500 mg BID 1 week on/1 week off, today -return tomorrow for first Nivo. -lab, f/u, and next Nivo as scheduled on 01/22/21  -will move treatments to Mondays in future.   No problem-specific Assessment & Plan notes found for this encounter.   SUMMARY OF ONCOLOGIC HISTORY: Oncology History Overview Note  Cancer Staging No matching staging information was found for the patient.    Primary Randy of distal third of esophagus ([facility] 11/11/2020 Procedure  EGD by Dr. Watt Climes - Normal larynx. -? Malignant-appearing esophageal stenosis. Biopsied. - Pills were found in the esophagus. Removal was successful. - Rule out  malignancy,? gastric tumor in the cardia difficult to say based on bleeding from passing the scope and unable to wash and suction for adequate visualization. - Normal ampulla, duodenal bulb, first portion of the duodenum, second portion of the duodenum, major papilla and area of the papilla. - The examination was otherwise normal.   11/11/2020 Initial Biopsy   FINAL MICROSCOPIC DIAGNOSIS:  A. ESOPHAGUS, DISTAL, BIOPSY:  -  Poorly differentiated Randy  -  See comment    11/11/2020 Initial Diagnosis   Primary Randy of distal third of esophagus ([place])   11/11/2020 Cancer Staging   Staging form: Esophagus - Randy, AJCC 8th Edition - Clinical stage from 11/11/2020: Stage IVB (cTX, cN1, cM1) - Signed by [name], MD on 12/26/2020   11/14/2020 Imaging   CT CAPIMPRESSION: 1. Aggressive appearing new tumor of the gastroesophageal Wood confluent with the irregular collection of right gastric lymph nodes, and with new associated metastatic retroperitoneal adenopathy. Small paraesophageal lymph nodes in the thorax are increased in size from prior exam and could also be involved, and there is a nonspecific 1.0 cm lesion inferiorly in the right hepatic lobe which could potentially be metastatic. 2. New subacute burst (AO type A4) fracture of L5, with mild posterior bony retropulsion and with a dominant coronally oriented fracture plane, and about 50% loss of height. Correlate with interval trauma. 3. Slight thickening along the left paracolic gutter and trace pelvic ascites, although the ascites is markedly reduced compared to 05/04/2020. 4. Other imaging findings of potential clinical significance: Aortic Atherosclerosis (ICD10-I70.0). Coronary atherosclerosis. Old compression fracture at L2.   12/03/2020 PET scan   IMPRESSION: Tumor at the GE Wood with local perigastric infiltration, hepatic and nodal metastases.   Nodal involvement both in the chest and  abdomen extending to the lower retroperitoneum in the abdomen.   Burst fracture at L5 shows signs of increased metabolic activity, nonspecific in the setting of fracture, in light of other findings would correlate with any history of trauma, if no history of trauma consider MRI to exclude the possibility of pathologic fracture.   12/27/2020 Genetic Testing   Negative hereditary cancer genetic testing: no pathogenic variants detected in [facility] Multi-Cancer +RNA Panel.  The report date is December 27, 2020.   The Multi-Cancer + RNA Panel offered by [facility] includes sequencing and/or deletion/duplication analysis of the following 84 genes:  AIP*, ALK, APC*, ATM*, AXIN2*, BAP1*, BARD1*, BLM*, BMPR1A*, BRCA1*, BRCA2*, BRIP1*, CASR, CDC73*, CDH1*, CDK4, CDKN1B*, CDKN1C*, CDKN2A, CEBPA, CHEK2*, CTNNA1*, DICER1*, DIS3L2*, EGFR, EPCAM, FH*, FLCN*, GATA2*, GPC3, GREM1, HOXB13, HRAS, KIT, MAX*, MEN1*, MET, MITF, MLH1*, MSH2*, MSH3*, MSH6*, MUTYH*, NBN*, NF1*, NF2*, NTHL1*, PALB2*, PDGFRA, PHOX2B, PMS2*, POLD1*, POLE*, POT1*, PRKAR1A*, PTCH1*, PTEN*, RAD50*, RAD51C*, RAD51D*, RB1*, RECQL4, RET, RUNX1*, SDHA*, SDHAF2*, SDHB*, SDHC*, SDHD*, SMAD4*, SMARCA4*, SMARCB1*, SMARCE1*, STK11*, SUFU*, TERC, TERT, TMEM127*, Tp53*, TSC1*, TSC2*, VHL*, WRN*, and WT1.  RNA analysis is performed for * genes.   12/31/2020 -  Chemotherapy    Patient is on Treatment Plan: GASTROESOPHAGEAL NIVOLUMAB Q14D X 8 CYCLES / NIVOLUMAB Q28D          INTERVAL HISTORY:  ARIAN MURLEY is here for a follow up of esophageal cancer. He was last seen by me on 12/26/20. He presents to the clinic accompanied by his daughter. He reports he still has pain from the radiation, which is  slowly improving. He notes it has been better the last few days. He notes he is using Carafate to be able to eat. I advised him to use liquid tylenol as needed. He notes the swallowing will gradually improve once he starts eating (as it stretches the esophagus).  He has not eaten well and lost weight. He reports drinking 2 Ensure each day as it is still difficulty to swallow. He notes he is drinking cranberry juice and is becoming able to tolerate water better.   All other systems were reviewed with the patient and are negative.  MEDICAL HISTORY:  Past Medical History:  Diagnosis Date   AKI (acute kidney injury) ([place])    Alcohol abuse    Arthritis    Cataract 2006   Diarrhea    Edema leg    Esophageal cancer ([place])    Family history of breast cancer 12/09/2020   Family history of ovarian cancer 12/09/2020   Family history of prostate cancer 12/09/2020   GERD (gastroesophageal reflux disease)    Glaucoma unknown   Hyperbilirubinemia    Hyperlipemia    Hypertension    Hypokalemia    Liver failure ([facility])    Macular degeneration, wet ([facility])    Microcytic anemia    Osteoarthritis    SOB (shortness of breath)    Weight gain     SURGICAL HISTORY: Past Surgical History:  Procedure Laterality Date   ANKLE SURGERY     BIOPSY  11/11/2020   Procedure: BIOPSY;  Surgeon: [name], MD;  Location: [facility];  Service: Gastroenterology;;   CATARACT EXTRACTION, BILATERAL     ESOPHAGOGASTRODUODENOSCOPY (EGD) WITH PROPOFOL N/A 11/11/2020   Procedure: ESOPHAGOGASTRODUODENOSCOPY (EGD) WITH PROPOFOL;  Surgeon: [name], MD;  Location: [facility];  Service: Gastroenterology;  Laterality: N/A;   EYE SURGERY  2006   FOREIGN BODY REMOVAL  11/11/2020   Procedure: FOREIGN BODY REMOVAL;  Surgeon: [name], MD;  Location: [facility];  Service: Gastroenterology;;   IR PARACENTESIS  05/09/2020   TONSILLECTOMY      I have reviewed the social history and family history with the patient and they are unchanged from previous note.  ALLERGIES:  is allergic to lisinopril.  MEDICATIONS:  Current Outpatient Medications  Medication Sig Dispense Refill   brimonidine (ALPHAGAN) 0.2 % ophthalmic solution Place 1 drop into the left eye 3 (three) times  daily.     capecitabine (XELODA) 500 MG tablet Take 3 tabs in the morning and evening, 30 minutes after meal,  for 7 days on and 7 days off 84 tablet 0   cetirizine (ZYRTEC) 10 MG tablet Take 10 mg by mouth daily.     dorzolamide-timolol (COSOPT) 22.3-6.8 MG/ML ophthalmic solution Place 1 drop into the left eye 2 (two) times daily.     EPINEPHrine 0.3 mg/0.3 mL IJ SOAJ injection Inject 0.3 mg into the muscle as needed for anaphylaxis.     ezetimibe (ZETIA) 10 MG tablet Take 1 tablet (10 mg total) by mouth daily. 90 tablet 3   fluticasone (FLONASE) 50 MCG/ACT nasal spray Place 1 spray into both nostrils daily as needed for allergies.     furosemide (LASIX) 40 MG tablet Take 1 tablet (40 mg total) by mouth daily. 90 tablet 0   GLUCOSAMINE-CHONDROITIN PO Take 1 capsule by mouth daily.     lactulose (CHRONULAC) 10 GM/15ML solution Take 15 mLs (10 g total) by mouth daily as needed for mild constipation. 236 mL 0   latanoprost (XALATAN) 0.005 % ophthalmic  solution Place 1 drop into the left eye daily.     Multiple Vitamins-Minerals (ONE-A-DAY MENS 50+) TABS Take 1 tablet by mouth daily.     Multiple Vitamins-Minerals (PRESERVISION AREDS 2 PO) Take 1 tablet by mouth 2 (two) times daily.     Netarsudil Dimesylate (RHOPRESSA) 0.02 % SOLN Place 1 drop into the left eye daily.     pantoprazole (PROTONIX) 40 MG tablet Take 40 mg by mouth daily.     potassium chloride 20 MEQ/15ML (10%) SOLN Take 15 mLs (20 mEq total) by mouth 2 (two) times daily. 473 mL 2   spironolactone (ALDACTONE) 50 MG tablet Take 50 mg by mouth daily.     sucralfate (CARAFATE) 1 g tablet Take 1 tablet (1 g total) by mouth 4 (four) times daily. Dissolve each tablet in 15 cc water before use. 120 tablet 2   No current facility-administered medications for this visit.    PHYSICAL EXAMINATION: ECOG PERFORMANCE STATUS: 2 - Symptomatic, <50% confined to bed  Vitals:   01/06/21 0914  BP: 116/61  Pulse: 92  Resp: 18  Temp: 98.2 F  (36.8 C)  SpO2: 100%   Wt Readings from Last 3 Encounters:  01/06/21 158 lb 9.6 oz (71.9 kg)  12/26/20 164 lb 11.2 oz (74.7 kg)  12/12/20 163 lb 3.2 oz (74 kg)     GENERAL:alert, no distress and comfortable SKIN: skin color normal, no rashes or significant lesions EYES: normal, Conjunctiva are pink and non-injected, sclera clear  NEURO: alert & oriented x 3 with fluent speech  LABORATORY DATA:  I have reviewed the data as listed CBC Latest Ref Rng & Units 01/06/2021 12/09/2020 11/25/2020  WBC 4.0 - 10.5 K/uL 5.7 7.4 8.5  Hemoglobin 13.0 - 17.0 g/dL 11.6(L) 11.7(L) 12.6(L)  Hematocrit 39.0 - 52.0 % 35.2(L) 35.7(L) 37.1(L)  Platelets 150 - 400 K/uL 150 232 232     CMP Latest Ref Rng & Units 01/06/2021 12/09/2020 11/25/2020  Glucose 70 - 99 mg/dL 206(H) 113(H) 114(H)  BUN 8 - 23 mg/dL 32(H) 18 15  Creatinine 0.61 - 1.24 mg/dL 1.14 1.14 1.09  Sodium 135 - 145 mmol/L 137 139 141  Potassium 3.5 - 5.1 mmol/L 2.5(LL) 4.4 3.1(L)  Chloride 98 - 111 mmol/L 97(L) 104 98  CO2 22 - 32 mmol/L _0 Calcium 8.9 - 10.3 mg/dL 9.2 9.8 9.6  Total Protein 6.5 - 8.1 g/dL 7.1 7.3 7.6  Total Bilirubin 0.3 - 1.2 mg/dL 0.5 0.6 0.8  Alkaline Phos 38 - 126 U/L 66 90 97  AST 15 - 41 U/L _1 ALT 0 - 44 U/L _2 RADIOGRAPHIC STUDIES: I have personally reviewed the radiological images as listed and agreed with the findings in the report. No results found.    No orders of the defined types were placed in this encounter.  All questions were answered. The patient knows to call the clinic with any problems, questions or concerns. No barriers to learning was detected. The total time spent in the appointment was 30 minutes.     [name], MD 01/06/2021   I, Wilburn Mylar, am acting as scribe for [name], MD.   I have reviewed the above documentation for accuracy and completeness, and I agree with the above.

## 2021-01-06 NOTE — Progress Notes (Signed)
Pharmacist Chemotherapy Monitoring - Initial Assessment    Anticipated start date: 01/07/21   The following has been reviewed per standard work regarding the patient's treatment regimen: The patient's diagnosis, treatment plan and drug doses, and organ/hematologic function Lab orders and baseline tests specific to treatment regimen  The treatment plan start date, drug sequencing, and pre-medications Prior authorization status  Patient's documented medication list, including drug-drug interaction screen and prescriptions for anti-emetics and supportive care specific to the treatment regimen The drug concentrations, fluid compatibility, administration routes, and timing of the medications to be used The patient's access for treatment and lifetime cumulative dose history, if applicable  The patient's medication allergies and previous infusion related reactions, if applicable   Changes made to treatment plan:  treatment plan date  Follow up needed:  N/A    Kennith Center, Pharm.D., CPP 01/06/2021@2 :16 PM

## 2021-01-07 ENCOUNTER — Encounter: Payer: Self-pay | Admitting: Hematology

## 2021-01-07 ENCOUNTER — Inpatient Hospital Stay: Payer: Medicare Other

## 2021-01-07 VITALS — BP 134/61 | HR 95 | Resp 16

## 2021-01-07 DIAGNOSIS — E876 Hypokalemia: Secondary | ICD-10-CM

## 2021-01-07 DIAGNOSIS — C155 Malignant neoplasm of lower third of esophagus: Secondary | ICD-10-CM | POA: Diagnosis not present

## 2021-01-07 DIAGNOSIS — Z5111 Encounter for antineoplastic chemotherapy: Secondary | ICD-10-CM | POA: Diagnosis not present

## 2021-01-07 DIAGNOSIS — Z79899 Other long term (current) drug therapy: Secondary | ICD-10-CM | POA: Diagnosis not present

## 2021-01-07 DIAGNOSIS — C787 Secondary malignant neoplasm of liver and intrahepatic bile duct: Secondary | ICD-10-CM | POA: Diagnosis not present

## 2021-01-07 DIAGNOSIS — K7460 Unspecified cirrhosis of liver: Secondary | ICD-10-CM | POA: Diagnosis not present

## 2021-01-07 MED ORDER — SODIUM CHLORIDE 0.9 % IV SOLN: Freq: Once | INTRAVENOUS | Status: AC

## 2021-01-07 MED ORDER — POTASSIUM CHLORIDE 10 MEQ/100ML IV SOLN
10.0000 meq | INTRAVENOUS | Status: AC
  Administered 2021-01-07 (×2): 10 meq via INTRAVENOUS
  Filled 2021-01-07 (×2): qty 100

## 2021-01-07 MED ORDER — SODIUM CHLORIDE 0.9 % IV SOLN: 20.0000 meq | Freq: Once | INTRAVENOUS | Status: DC

## 2021-01-07 MED ORDER — SODIUM CHLORIDE 0.9 % IV SOLN
240.0000 mg | Freq: Once | INTRAVENOUS | Status: AC
  Administered 2021-01-07: 240 mg via INTRAVENOUS
  Filled 2021-01-07: qty 24

## 2021-01-07 NOTE — Patient Instructions (Signed)
[facility] Discharge Instructions for Patients Receiving Chemotherapy  Today you received the following chemotherapy agents opdivo   To help prevent nausea and vomiting after your treatment, we encourage you to take your nausea medication as directed.    If you develop nausea and vomiting that is not controlled by your nausea medication, call the clinic.   BELOW ARE SYMPTOMS THAT SHOULD BE REPORTED IMMEDIATELY: *FEVER GREATER THAN 100.5 F *CHILLS WITH OR WITHOUT FEVER NAUSEA AND VOMITING THAT IS NOT CONTROLLED WITH YOUR NAUSEA MEDICATION *UNUSUAL SHORTNESS OF BREATH *UNUSUAL BRUISING OR BLEEDING TENDERNESS IN MOUTH AND THROAT WITH OR WITHOUT PRESENCE OF ULCERS *URINARY PROBLEMS *BOWEL PROBLEMS UNUSUAL RASH Items with * indicate a potential emergency and should be followed up as soon as possible.  Feel free to call the clinic you have any questions or concerns. The clinic phone number is [phone].  Hypokalemia Hypokalemia means that the amount of potassium in the blood is lower than normal. Potassium is a chemical (electrolyte) that helps regulate the amount of fluid in the body. It also stimulates muscle tightening (contraction) and helps nerves work properly. Normally, most of the body's potassium is inside cells, and only a very small amount is in the blood. Because the amount in the blood is so small, minor changes to potassium levels in the blood can be life-threatening. What are the causes? This condition may be caused by: Antibiotic medicine. Diarrhea or vomiting. Taking too much of a medicine that helps you have a bowel movement (laxative) can cause diarrhea and lead to hypokalemia. Chronic kidney disease (CKD). Medicines that help the body get rid of excess fluid (diuretics). Eating disorders, such as bulimia. Low magnesium levels in the body. Sweating a lot. What are the signs or symptoms? Symptoms of this condition  include: Weakness. Constipation. Fatigue. Muscle cramps. Mental confusion. Skipped heartbeats or irregular heartbeat (palpitations). Tingling or numbness. How is this diagnosed? This condition is diagnosed with a blood test. How is this treated? This condition may be treated by: Taking potassium supplements by mouth. Adjusting the medicines that you take. Eating more foods that contain a lot of potassium. If your potassium level is very low, you may need to get potassium through an IV and be monitored in the hospital. Follow these instructions at home:  Take over-the-counter and prescription medicines only as told by your health care provider. This includes vitamins and supplements. Eat a healthy diet. A healthy diet includes fresh fruits and vegetables, whole grains, healthy fats, and lean proteins. If instructed, eat more foods that contain a lot of potassium. This includes: Nuts, such as peanuts and pistachios. Seeds, such as sunflower seeds and pumpkin seeds. Peas, lentils, and lima beans. Whole grain and bran cereals and breads. Fresh fruits and vegetables, such as apricots, avocado, bananas, cantaloupe, kiwi, oranges, tomatoes, asparagus, and potatoes. Orange juice. Tomato juice. Red meats. Yogurt. Keep all follow-up visits as told by your health care provider. This is important. Contact a health care provider if you: Have weakness that gets worse. Feel your heart pounding or racing. Vomit. Have diarrhea. Have diabetes (diabetes mellitus) and you have trouble keeping your blood sugar (glucose) in your target range. Get help right away if you: Have chest pain. Have shortness of breath. Have vomiting or diarrhea that lasts for more than 2 days. Faint. Summary Hypokalemia means that the amount of potassium in the blood is lower than normal. This condition is diagnosed with a blood test. Hypokalemia may be treated by  taking potassium supplements, adjusting the medicines  that you take, or eating more foods that are high in potassium. If your potassium level is very low, you may need to get potassium through an IV and be monitored in the hospital. This information is not intended to replace advice given to you by your health care provider. Make sure you discuss any questions you have with your health care provider. Document Revised: 10/26/2017 Document Reviewed: 10/27/2017 Elsevier Patient Education  [place].

## 2021-01-08 ENCOUNTER — Other Ambulatory Visit: Payer: Medicare Other

## 2021-01-08 ENCOUNTER — Other Ambulatory Visit: Payer: Medicare Other | Admitting: *Deleted

## 2021-01-08 ENCOUNTER — Other Ambulatory Visit: Payer: Self-pay

## 2021-01-08 DIAGNOSIS — Z515 Encounter for palliative care: Secondary | ICD-10-CM

## 2021-01-08 NOTE — Progress Notes (Signed)
                                                                                                                                                             Patient Name: Randy Wood MRN: [id number] DOB: 1940-12-26 Referring Physician: Eunice Blase (Profile Not Attached) Date of Service: 12/27/2020 [facility], [place]                                                        End Of Treatment Note  Diagnoses: C15.5-Malignant neoplasm of lower third of esophagus  Cancer Staging: Stage IV, adenocarcinoma of the distal esophagus and GI junction.  Intent: Curative  Radiation Treatment Dates: 12/12/2020 through 12/27/2020 Site Technique Total Dose (Gy) Dose per Fx (Gy) Completed Fx Beam Energies  Esophagus: Esoph 3D 30/30 3 10/10 10X  Esophagus: Esoph_Bst 3D 6/6 3 2/2 10X   Narrative: The patient tolerated radiation therapy relatively well. He did have esophagitis from treatment but was still able to eat soft foods.   Plan: The patient will receive a call in about one month from the radiation oncology department. He will continue follow up with Dr. Burr Medico as well.   ________________________________________________    Carola Rhine, [facility]

## 2021-01-08 NOTE — Progress Notes (Signed)
[facility] COMMUNITY PALLIATIVE CARE RN NOTE  PATIENT NAME: Randy Wood DOB: 07-15-1940 MRN: [id number]  PRIMARY CARE PROVIDER: [name], Raymond J, MD  RESPONSIBLE PARTY:  Acct ID - Guarantor Home Phone Work Phone Relationship Acct Type  [id number] [phone]  Self P/F     [address], [place], [place] [address]   Covid-19 Pre-screening Negative  PLAN OF CARE and INTERVENTION:  ADVANCE CARE PLANNING/GOALS OF CARE: Goal is for patient to have a good quality of life and enjoy time with family and friends. He wants to remain in his home.  PATIENT/CAREGIVER EDUCATION: Explained palliative care services, symptom management DISEASE STATUS: Joint visit made with LCSW, M. Lonon. Met with patient in his home. Patient has a diagnosis of esophageal cancer. He just completed 12 radiation treatments. He says he did well until the end of the last week of treatments then they started to hurt. This symptom is now improving. On Monday he started Xeloda chemotherapy pills. He takes 3 in the morning and 3 in the evenings with food every other Monday. He also received his first infusion of Opdivo yesterday along with 40 meq of IV potassium (K+ was 2.5). He will receive Opdivo also every other week. He will have 8 cycles. Once cycles have completed, he will have a PET scan. He reports his swallowing is improving along with his appetite. He eats a lot of soup, but also has started being able to eat soft foods. He takes his medications whole, but if they are large pills, he will crush and take with applesauce. Carate is helping to coat his throat and is taking this 4-5x/day. He says that he has lost about 60 lbs since February. He speaks of being hospitalized in February for 8 days due to peritonitis and says he had ecoli throughout his body. He also had a significant build up of fluid during that time. Prior to this hospitalization he reports feeling sick and did not eat for 31 days. After this  hospitalization he received in home therapy because he was too weak to walk. He is now ambulatory and independent with ADLs. He has 2 walkers but has not needed them, and uses a cane when outside of the home. He is able to prepare light meals. He has someone to help with household chores every 2 weeks and someone for lawn care. He has been able to visit more with friends and is back to attending church which brings him happiness. He has macular degeneration and receives an Avastin injection in his right eye every 5th Monday and feels this is helping to improve his vision. He is agreeable to future visits with palliative care.   HISTORY OF PRESENT ILLNESS:  This is a 80 yo male with a diagnosis of esophageal cancer. He has a history of hypertension, esophageal stricture, GERD, IBS, cirrhosis and essential tremors. Palliative care has been asked to follow patient for additional support, goals of care and complex decision making.   CODE STATUS: Full code ADVANCED DIRECTIVES: Y MOST FORM: no PPS: 70%   PHYSICAL EXAM:   LUNGS: clear to auscultation  CARDIAC: Cor RRR EXTREMITIES: No edema SKIN:  Exposed skin is dry and intact   NEURO:  Alert and oriented x 4, pleasant mood, ambulatory   (Duration of visit and documentation 75 minutes)   Daryl Eastern, RN BSN

## 2021-01-08 NOTE — Progress Notes (Signed)
COMMUNITY PALLIATIVE CARE SW NOTE  PATIENT NAME: Randy Wood DOB: May 31, 1940 MRN: [id number]  PRIMARY CARE PROVIDER: [name], Raymond J, MD  RESPONSIBLE PARTY:  Acct ID - Guarantor Home Phone Work Phone Relationship Acct Type  [id number] [phone]  Self P/F     [place], [place], [place] [address]     PLAN OF CARE and INTERVENTIONS:             GOALS OF CARE/ ADVANCE CARE PLANNING:  Goal is for patient to remain in his home. Patient is a Full Code. SOCIAL/EMOTIONAL/SPIRITUAL ASSESSMENT/ INTERVENTIONS:  SW and RN-M.Nadara Mustard completed an initial palliative care visit with patient at his home. Patient has an esophageal cancer diagnosis. The team provided education regarding the palliative care program and obtained a verbal consent to services. Patient reported that this has been a good week for him after having a crisis situation last Friday where his potassium was low. He now has medication to treat this and reports he feels it has been effective. He has started chemotherapy pills, 3 in the morning and three in the evening with food every other Monday. He has also received his first infusion (Opdivo) along with a two bags of potassium and will receive 8 cycles of Opdivo every other week that will be followed by a PET Scan. Patient report that he lives alone with daily contact and is independent for all ADL's. He report that he is now getting back to church and socializing with friends and is optimistic about his treatments. He reports improved swallowing and  eats softer foods such as soup. He ambulates with a can outside the home, but ambulates independently in the home. Patient has a housekeeper (2x/month) Horticulturist, commercial (2x month). Patient is open to ongoing palliative care visits/support. PATIENT/CAREGIVER EDUCATION/ COPING:  Patient appears to be coping well. He has a supportive network of friends and family. PERSONAL EMERGENCY PLAN:  911 can be activated for  emergencies. COMMUNITY RESOURCES COORDINATION/ HEALTH CARE NAVIGATION:  None.  FINANCIAL/LEGAL CONCERNS/INTERVENTIONS:  None.     SOCIAL HX:  Social History   Tobacco Use   Smoking status: Former    Packs/day: 2.00    Years: 15.00    Pack years: 30.00    Types: Cigarettes    Quit date: 03/30/1972    Years since quitting: 48.8   Smokeless tobacco: Never  Substance Use Topics   Alcohol use: Not Currently    Comment: 2-3 drinks/night, none since 04/2020    CODE STATUS: Full Code ADVANCED DIRECTIVES: Yes MOST FORM COMPLETE:  No HOSPICE EDUCATION PROVIDED: No  PPS: Patient is alert and oriented x3. He is independent for all ADL's.   Duration of visit and documentation: 60 minutes  Katheren Puller, LCSW

## 2021-01-13 ENCOUNTER — Encounter: Payer: Self-pay | Admitting: Hematology

## 2021-01-16 ENCOUNTER — Other Ambulatory Visit (HOSPITAL_COMMUNITY): Payer: Self-pay

## 2021-01-20 ENCOUNTER — Encounter (INDEPENDENT_AMBULATORY_CARE_PROVIDER_SITE_OTHER): Payer: Medicare Other | Admitting: Ophthalmology

## 2021-01-20 ENCOUNTER — Other Ambulatory Visit (HOSPITAL_COMMUNITY): Payer: Self-pay

## 2021-01-20 ENCOUNTER — Other Ambulatory Visit: Payer: Self-pay

## 2021-01-20 DIAGNOSIS — H353211 Exudative age-related macular degeneration, right eye, with active choroidal neovascularization: Secondary | ICD-10-CM

## 2021-01-20 DIAGNOSIS — H35033 Hypertensive retinopathy, bilateral: Secondary | ICD-10-CM

## 2021-01-20 DIAGNOSIS — I10 Essential (primary) hypertension: Secondary | ICD-10-CM

## 2021-01-20 DIAGNOSIS — H43813 Vitreous degeneration, bilateral: Secondary | ICD-10-CM | POA: Diagnosis not present

## 2021-01-20 DIAGNOSIS — H353122 Nonexudative age-related macular degeneration, left eye, intermediate dry stage: Secondary | ICD-10-CM | POA: Diagnosis not present

## 2021-01-21 ENCOUNTER — Ambulatory Visit
Admission: RE | Admit: 2021-01-21 | Discharge: 2021-01-21 | Disposition: A | Discharge status: Home / Self Care | Payer: Medicare Other | Source: Ambulatory Visit | Attending: Radiation Oncology | Admitting: Radiation Oncology

## 2021-01-21 DIAGNOSIS — C155 Malignant neoplasm of lower third of esophagus: Secondary | ICD-10-CM

## 2021-01-22 ENCOUNTER — Other Ambulatory Visit: Payer: Self-pay

## 2021-01-22 ENCOUNTER — Encounter: Payer: Self-pay | Admitting: Hematology

## 2021-01-22 ENCOUNTER — Inpatient Hospital Stay: Payer: Medicare Other | Admitting: Dietician

## 2021-01-22 ENCOUNTER — Inpatient Hospital Stay (HOSPITAL_BASED_OUTPATIENT_CLINIC_OR_DEPARTMENT_OTHER): Payer: Medicare Other | Admitting: Hematology

## 2021-01-22 ENCOUNTER — Inpatient Hospital Stay: Payer: Medicare Other

## 2021-01-22 VITALS — BP 144/66 | HR 98 | Temp 98.3°F | Resp 18 | Ht 71.0 in | Wt 158.3 lb

## 2021-01-22 DIAGNOSIS — Z79899 Other long term (current) drug therapy: Secondary | ICD-10-CM | POA: Diagnosis not present

## 2021-01-22 DIAGNOSIS — Z5111 Encounter for antineoplastic chemotherapy: Secondary | ICD-10-CM | POA: Diagnosis not present

## 2021-01-22 DIAGNOSIS — C155 Malignant neoplasm of lower third of esophagus: Secondary | ICD-10-CM | POA: Diagnosis not present

## 2021-01-22 DIAGNOSIS — C787 Secondary malignant neoplasm of liver and intrahepatic bile duct: Secondary | ICD-10-CM | POA: Diagnosis not present

## 2021-01-22 DIAGNOSIS — K7460 Unspecified cirrhosis of liver: Secondary | ICD-10-CM | POA: Diagnosis not present

## 2021-01-22 LAB — FERRITIN: Ferritin: 83 ng/mL (ref 24–336)

## 2021-01-22 LAB — CBC WITH DIFFERENTIAL (CANCER CENTER ONLY)
Abs Immature Granulocytes: 0.01 10*3/uL (ref 0.00–0.07)
Basophils Absolute: 0 10*3/uL (ref 0.0–0.1)
Basophils Relative: 0 %
Eosinophils Absolute: 0.1 10*3/uL (ref 0.0–0.5)
Eosinophils Relative: 1 %
HCT: 35.1 % — ABNORMAL LOW (ref 39.0–52.0)
Hemoglobin: 11.7 g/dL — ABNORMAL LOW (ref 13.0–17.0)
Immature Granulocytes: 0 %
Lymphocytes Relative: 17 %
Lymphs Abs: 0.9 10*3/uL (ref 0.7–4.0)
MCH: 30.3 pg (ref 26.0–34.0)
MCHC: 33.3 g/dL (ref 30.0–36.0)
MCV: 90.9 fL (ref 80.0–100.0)
Monocytes Absolute: 0.5 10*3/uL (ref 0.1–1.0)
Monocytes Relative: 10 %
Neutro Abs: 3.7 10*3/uL (ref 1.7–7.7)
Neutrophils Relative %: 72 %
Platelet Count: 148 10*3/uL — ABNORMAL LOW (ref 150–400)
RBC: 3.86 MIL/uL — ABNORMAL LOW (ref 4.22–5.81)
RDW: 16.6 % — ABNORMAL HIGH (ref 11.5–15.5)
WBC Count: 5.2 10*3/uL (ref 4.0–10.5)
nRBC: 0 % (ref 0.0–0.2)

## 2021-01-22 LAB — CMP (CANCER CENTER ONLY)
ALT: 14 U/L (ref 0–44)
AST: 26 U/L (ref 15–41)
Albumin: 3.3 g/dL — ABNORMAL LOW (ref 3.5–5.0)
Alkaline Phosphatase: 80 U/L (ref 38–126)
Anion gap: 12 (ref 5–15)
BUN: 20 mg/dL (ref 8–23)
CO2: 19 mmol/L — ABNORMAL LOW (ref 22–32)
Calcium: 9.5 mg/dL (ref 8.9–10.3)
Chloride: 109 mmol/L (ref 98–111)
Creatinine: 0.97 mg/dL (ref 0.61–1.24)
GFR, Estimated: >60 mL/min (ref >60)
Glucose, Bld: 207 mg/dL — ABNORMAL HIGH (ref 70–99)
Potassium: 3.7 mmol/L (ref 3.5–5.1)
Sodium: 140 mmol/L (ref 135–145)
Total Bilirubin: 0.8 mg/dL (ref 0.3–1.2)
Total Protein: 7.1 g/dL (ref 6.5–8.1)

## 2021-01-22 LAB — IRON AND TIBC
Iron: 65 ug/dL (ref 42–163)
Saturation Ratios: 18 % — ABNORMAL LOW (ref 20–55)
TIBC: 358 ug/dL (ref 202–409)
UIBC: 292 ug/dL (ref 117–376)

## 2021-01-22 LAB — TSH: TSH: 2.075 u[IU]/mL (ref 0.320–4.118)

## 2021-01-22 MED ORDER — SODIUM CHLORIDE 0.9 % IV SOLN: Freq: Once | INTRAVENOUS | Status: AC

## 2021-01-22 MED ORDER — SODIUM CHLORIDE 0.9 % IV SOLN
240.0000 mg | Freq: Once | INTRAVENOUS | Status: AC
  Administered 2021-01-22: 240 mg via INTRAVENOUS
  Filled 2021-01-22: qty 24

## 2021-01-22 NOTE — Progress Notes (Signed)
Randy Wood   Telephone:(336) [phone] Fax:(336) [phone]   Clinic Follow up Note   Patient Care Team: [name], [name], MD as PCP - General (Family Medicine) [name], MD as Consulting Physician (Gastroenterology) [name], MD as Consulting Physician (Hematology) [name], NP as Nurse Practitioner (Nurse Practitioner) [name], RN as Registered Nurse  Date of Service:  01/22/2021  CHIEF COMPLAINT: f/u of esophageal cancer  CURRENT THERAPY:  -Xeloda 1535m q12h, 1 week on/1 week off, starting 01/06/21  -Nivolumab q2weeks, to start 01/07/21  ASSESSMENT & PLAN:  Randy TIEDTis a 80y.o. male with   1. Adenocarcinoma of the distal esophagus/GE junction, with liver and node metastasis, HER2-, MMR normal, PD-L1 3% -He presented with progressive dysphagia and weight loss, work-up showed moderate stenosis in the distal third of the esophagus invading the gastric cardia, path 11/25/20 confirmed poorly differentiated adenocarcinoma.   -PET 12/03/20 showed: esophageal tumor with local perigastric infiltration, hepatic, and nodal metastases; nodal involvement both in chest and abdomen extending to lower retroperitoneum; burst fracture at L5 with increased metabolic activity. -Molecular testing showed MMR normal, PD-L1 CPS 3% -He received palliative radiation therapy to primary tumor 9/15-9/30/22 -he began single agent Xeloda (oral), and nivolumab (IV) every 2 weeks on 01/07/21. He is tolerating well  -labs reviewed, adequate for treatment, plan to start cycle 2 nivolumab today, he started Xeloda 2 days ago   2.  Dysphagia, odynophagia, and weight loss -Onset early 2Mar 07, 2022after hospitalization for bacterial peritonitis, 40 lbs in 6 months  -in past few weeks he lost 15 lbs due to severe dysphagia and odynophagia. His weight has been stable since that drop. -EGD showed numerous pills in middle/distal esophagus which were removed. -he is using nutrition  supplements such as boost/Ensure, pured food, high caloric intake, and soft diet.    3.  Cirrhosis, abdominal pain -felt to be alcohol-related. He has abstained from alcohol since 203-09-2020-S/p paracentesis 05/09/20 yielding 4.4 liters, path showed reactive mesothelial cells -Compensated, hepatic function panel 09/17/20 WNL -[facility] by [facility]   4.Social support -He is widowed and lives alone, his wife died from [place] 2Mar 07, 2017 she was treated here -2 daughters live out of town, [place] is the closest, also his primary contact for healthcare -He has church community and some family friends who could help if needed -Patient is independent with ADLs but has a hSecretary/administratorand yard service.  He is open to social work referral for transportation if needed, I referred him   5. Family history  -Patient's mother had ovarian cancer, father had prostate cancer, and daughter [name] breast cancer at age 862-His daughter JSharyn Lullwithout cancer underwent genetic testing and is negative -He was referred to genetics and underwent testing on 12/09/20. Results were negative.   6. Hypokalemia  -K 2.8 t2 weeks ago, secondary to diuretics  -he has a lot of difficulty to swallow oral potassium pills  -he has stopped his Lasix. His potassium level is 3.7 today (01/22/21), so he does not need the KCL.    PLAN: -proceed to Nivo today -continue Xeloda, 1500 mg BID 1 week on/1 week off, he started cycle 2 2 days ago  -lab, f/u, and next Nivo as scheduled on 02/03/21            No problem-specific Assessment & Plan notes found for this encounter.   SUMMARY OF ONCOLOGIC HISTORY: Oncology History Overview Note  Cancer Staging  No matching staging information was found for the patient.    Primary adenocarcinoma of distal third of esophagus ([place])  11/11/2020 Procedure   EGD by Dr. Watt Climes - Normal larynx. -? Malignant-appearing esophageal stenosis. Biopsied. - Pills were found in the  esophagus. Removal was successful. - Rule out malignancy,? gastric tumor in the cardia difficult to say based on bleeding from passing the scope and unable to wash and suction for adequate visualization. - Normal ampulla, duodenal bulb, first portion of the duodenum, second portion of the duodenum, major papilla and area of the papilla. - The examination was otherwise normal.   11/11/2020 Initial Biopsy   FINAL MICROSCOPIC DIAGNOSIS:  A. ESOPHAGUS, DISTAL, BIOPSY:  -  Poorly differentiated adenocarcinoma  -  See comment    11/11/2020 Initial Diagnosis   Primary adenocarcinoma of distal third of esophagus ([place])   11/11/2020 Cancer Staging   Staging form: Esophagus - Adenocarcinoma, AJCC 8th Edition - Clinical stage from 11/11/2020: Stage IVB (cTX, cN1, cM1) - Signed by [name], MD on 12/26/2020    11/14/2020 Imaging   CT CAPIMPRESSION: 1. Aggressive appearing new tumor of the gastroesophageal junction confluent with the irregular collection of right gastric lymph nodes, and with new associated metastatic retroperitoneal adenopathy. Small paraesophageal lymph nodes in the thorax are increased in size from prior exam and could also be involved, and there is a nonspecific 1.0 cm lesion inferiorly in the right hepatic lobe which could potentially be metastatic. 2. New subacute burst (AO type A4) fracture of L5, with mild posterior bony retropulsion and with a dominant coronally oriented fracture plane, and about 50% loss of height. Correlate with interval trauma. 3. Slight thickening along the left paracolic gutter and trace pelvic ascites, although the ascites is markedly reduced compared to 05/04/2020. 4. Other imaging findings of potential clinical significance: Aortic Atherosclerosis (ICD10-I70.0). Coronary atherosclerosis. Old compression fracture at L2.   12/03/2020 PET scan   IMPRESSION: Tumor at the GE junction with local perigastric infiltration, hepatic and nodal  metastases.   Nodal involvement both in the chest and abdomen extending to the lower retroperitoneum in the abdomen.   Burst fracture at L5 shows signs of increased metabolic activity, nonspecific in the setting of fracture, in light of other findings would correlate with any history of trauma, if no history of trauma consider MRI to exclude the possibility of pathologic fracture.   12/27/2020 Genetic Testing   Negative hereditary cancer genetic testing: no pathogenic variants detected in [facility] Multi-Cancer +RNA Panel.  The report date is December 27, 2020.   The Multi-Cancer + RNA Panel offered by [facility] includes sequencing and/or deletion/duplication analysis of the following 84 genes:  AIP*, ALK, APC*, ATM*, AXIN2*, BAP1*, BARD1*, BLM*, BMPR1A*, BRCA1*, BRCA2*, BRIP1*, CASR, CDC73*, CDH1*, CDK4, CDKN1B*, CDKN1C*, CDKN2A, CEBPA, CHEK2*, CTNNA1*, DICER1*, DIS3L2*, EGFR, EPCAM, FH*, FLCN*, GATA2*, GPC3, GREM1, HOXB13, HRAS, KIT, MAX*, MEN1*, MET, MITF, MLH1*, MSH2*, MSH3*, MSH6*, MUTYH*, NBN*, NF1*, NF2*, NTHL1*, PALB2*, PDGFRA, PHOX2B, PMS2*, POLD1*, POLE*, POT1*, PRKAR1A*, PTCH1*, PTEN*, RAD50*, RAD51C*, RAD51D*, RB1*, RECQL4, RET, RUNX1*, SDHA*, SDHAF2*, SDHB*, SDHC*, SDHD*, SMAD4*, SMARCA4*, SMARCB1*, SMARCE1*, STK11*, SUFU*, TERC, TERT, TMEM127*, Tp53*, TSC1*, TSC2*, VHL*, WRN*, and WT1.  RNA analysis is performed for * genes.   01/07/2021 -  Chemotherapy   Patient is on Treatment Plan : GASTROESOPHAGEAL Nivolumab q14d x 8 cycles / Nivolumab q28d        INTERVAL HISTORY:  AURON TADROS is here for a follow up of esophageal cancer. He was last seen  by me on 01/06/21. He presents to the clinic accompanied by his daughter. He reports he is not feeling well. He states he tried to take the "salt" but "it burned all the way down" when he tried to take it. ("Salt" refers to the KCL.) Prior to this, he reports he was feeling well and eating better. He reports he is spitting up saliva and has  a water bottle he spits into. He reports he now has trouble swallowing and was unable to take his Xeloda last night. He notes he was able to take the Xeloda this morning with no problem.   All other systems were reviewed with the patient and are negative.  MEDICAL HISTORY:  Past Medical History:  Diagnosis Date   AKI (acute kidney injury) ([place])    Alcohol abuse    Arthritis    Cataract 2006   Diarrhea    Edema leg    Esophageal cancer ([place])    Family history of breast cancer 12/09/2020   Family history of ovarian cancer 12/09/2020   Family history of prostate cancer 12/09/2020   GERD (gastroesophageal reflux disease)    Glaucoma unknown   Hyperbilirubinemia    Hyperlipemia    Hypertension    Hypokalemia    Liver failure (HCC)    Macular degeneration, wet (HCC)    Microcytic anemia    Osteoarthritis    SOB (shortness of breath)    Weight gain     SURGICAL HISTORY: Past Surgical History:  Procedure Laterality Date   ANKLE SURGERY     BIOPSY  11/11/2020   Procedure: BIOPSY;  Surgeon: [name], MD;  Location: [facility];  Service: Gastroenterology;;   CATARACT EXTRACTION, BILATERAL     ESOPHAGOGASTRODUODENOSCOPY (EGD) WITH PROPOFOL N/A 11/11/2020   Procedure: ESOPHAGOGASTRODUODENOSCOPY (EGD) WITH PROPOFOL;  Surgeon: [name], MD;  Location: [facility];  Service: Gastroenterology;  Laterality: N/A;   EYE SURGERY  2006   FOREIGN BODY REMOVAL  11/11/2020   Procedure: FOREIGN BODY REMOVAL;  Surgeon: [name], MD;  Location: [facility];  Service: Gastroenterology;;   IR PARACENTESIS  05/09/2020   TONSILLECTOMY      I have reviewed the social history and family history with the patient and they are unchanged from previous note.  ALLERGIES:  is allergic to lisinopril.  MEDICATIONS:  Current Outpatient Medications  Medication Sig Dispense Refill   brimonidine (ALPHAGAN) 0.2 % ophthalmic solution Place 1 drop into the left eye 3 (three) times daily.      capecitabine (XELODA) 500 MG tablet Take 3 tabs in the morning and evening, 30 minutes after meal,  for 7 days on and 7 days off 84 tablet 0   cetirizine (ZYRTEC) 10 MG tablet Take 10 mg by mouth daily.     dorzolamide-timolol (COSOPT) 22.3-6.8 MG/ML ophthalmic solution Place 1 drop into the left eye 2 (two) times daily.     EPINEPHrine 0.3 mg/0.3 mL IJ SOAJ injection Inject 0.3 mg into the muscle as needed for anaphylaxis.     ezetimibe (ZETIA) 10 MG tablet Take 1 tablet (10 mg total) by mouth daily. 90 tablet 3   fluticasone (FLONASE) 50 MCG/ACT nasal spray Place 1 spray into both nostrils daily as needed for allergies.     GLUCOSAMINE-CHONDROITIN PO Take 1 capsule by mouth daily.     lactulose (CHRONULAC) 10 GM/15ML solution Take 15 mLs (10 g total) by mouth daily as needed for mild constipation. 236 mL 0   latanoprost (XALATAN) 0.005 % ophthalmic solution  Place 1 drop into the left eye daily.     Multiple Vitamins-Minerals (ONE-A-DAY MENS 50+) TABS Take 1 tablet by mouth daily.     Multiple Vitamins-Minerals (PRESERVISION AREDS 2 PO) Take 1 tablet by mouth 2 (two) times daily.     Netarsudil Dimesylate (RHOPRESSA) 0.02 % SOLN Place 1 drop into the left eye daily.     potassium chloride 20 MEQ/15ML (10%) SOLN Take 15 mLs (20 mEq total) by mouth 2 (two) times daily. 473 mL 2   potassium chloride SA (KLOR-CON) 20 MEQ tablet Take 2 tablets (40 mEq total) by mouth daily AND 3 tablets (60 mEq total) See admin instructions. Take 54mQ (3 tablets) on day 1 and then take 421m (2 tablets) daily for 30 days.  Pt my take medication w/applesauce or yogurt d.. 63 tablet 5   spironolactone (ALDACTONE) 50 MG tablet Take 50 mg by mouth daily.     sucralfate (CARAFATE) 1 g tablet Take 1 tablet (1 g total) by mouth 4 (four) times daily. Dissolve each tablet in 15 cc water before use. 120 tablet 2   No current facility-administered medications for this visit.    PHYSICAL EXAMINATION: ECOG PERFORMANCE STATUS: 2  - Symptomatic, <50% confined to bed  Vitals:   01/22/21 1228  BP: (!) 144/66  Pulse: 98  Resp: 18  Temp: 98.3 F (36.8 C)  SpO2: 100%   Wt Readings from Last 3 Encounters:  01/22/21 71.8 kg  01/06/21 71.9 kg  12/26/20 74.7 kg     GENERAL:alert, no distress and comfortable SKIN: skin color normal, no rashes or significant lesions EYES: normal, Conjunctiva are pink and non-injected, sclera clear  NEURO: alert & oriented x 3 with fluent speech  LABORATORY DATA:  I have reviewed the data as listed CBC Latest Ref Rng & Units 01/22/2021 01/06/2021 12/09/2020  WBC 4.0 - 10.5 K/uL 5.2 5.7 7.4  Hemoglobin 13.0 - 17.0 g/dL 11.7(L) 11.6(L) 11.7(L)  Hematocrit 39.0 - 52.0 % 35.1(L) 35.2(L) 35.7(L)  Platelets 150 - 400 K/uL 148(L) 150 232     CMP Latest Ref Rng & Units 01/22/2021 01/06/2021 12/09/2020  Glucose 70 - 99 mg/dL 207(H) 206(H) 113(H)  BUN 8 - 23 mg/dL 20 32(H) 18  Creatinine 0.61 - 1.24 mg/dL 0.97 1.14 1.14  Sodium 135 - 145 mmol/L 140 137 139  Potassium 3.5 - 5.1 mmol/L 3.7 2.5(LL) 4.4  Chloride 98 - 111 mmol/L 109 97(L) 104  CO2 22 - 32 mmol/L 19(L) 27 26  Calcium 8.9 - 10.3 mg/dL 9.5 9.2 9.8  Total Protein 6.5 - 8.1 g/dL 7.1 7.1 7.3  Total Bilirubin 0.3 - 1.2 mg/dL 0.8 0.5 0.6  Alkaline Phos 38 - 126 U/L 80 66 90  AST 15 - 41 U/L _0 ALT 0 - 44 U/L _1 RADIOGRAPHIC STUDIES: I have personally reviewed the radiological images as listed and agreed with the findings in the report. No results found.    No orders of the defined types were placed in this encounter.  All questions were answered. The patient knows to call the clinic with any problems, questions or concerns. No barriers to learning was detected. The total time spent in the appointment was 30 minutes.     YaTruitt MerleMD 01/22/2021   I, KaWilburn Mylaram acting as scribe for YaTruitt MerleMD.   I have reviewed the above documentation for accuracy and completeness, and I agree with  the above.

## 2021-01-22 NOTE — Patient Instructions (Signed)
[facility] MEDICAL [facility]COLOGY  Discharge Instructions: ?Thank you for choosing [facility] to provide your oncology and hematology care.  ? ?If you have a lab appointment with the Cancer Center, please go directly to the Cancer Center and check in at the registration area. ?  ?Wear comfortable clothing and clothing appropriate for easy access to any Portacath or PICC line.  ? ?We strive to give you quality time with your provider. You may need to reschedule your appointment if you arrive late (15 or more minutes).  Arriving late affects you and other patients whose appointments are after yours.  Also, if you miss three or more appointments without notifying the office, you may be dismissed from the clinic at the provider?s discretion.    ?  ?For prescription refill requests, have your pharmacy contact our office and allow 72 hours for refills to be completed.   ? ?Today you received the following chemotherapy and/or immunotherapy agents Opdivo    ?  ?To help prevent nausea and vomiting after your treatment, we encourage you to take your nausea medication as directed. ? ?BELOW ARE SYMPTOMS THAT SHOULD BE REPORTED IMMEDIATELY: ?*FEVER GREATER THAN 100.4 F (38 ?C) OR HIGHER ?*CHILLS OR SWEATING ?*NAUSEA AND VOMITING THAT IS NOT CONTROLLED WITH YOUR NAUSEA MEDICATION ?*UNUSUAL SHORTNESS OF BREATH ?*UNUSUAL BRUISING OR BLEEDING ?*URINARY PROBLEMS (pain or burning when urinating, or frequent urination) ?*BOWEL PROBLEMS (unusual diarrhea, constipation, pain near the anus) ?TENDERNESS IN MOUTH AND THROAT WITH OR WITHOUT PRESENCE OF ULCERS (sore throat, sores in mouth, or a toothache) ?UNUSUAL RASH, SWELLING OR PAIN  ?UNUSUAL VAGINAL DISCHARGE OR ITCHING  ? ?Items with * indicate a potential emergency and should be followed up as soon as possible or go to the Emergency Department if any problems should occur. ? ?Please show the CHEMOTHERAPY ALERT CARD or IMMUNOTHERAPY ALERT CARD at check-in to the  Emergency Department and triage nurse. ? ?Should you have questions after your visit or need to cancel or reschedule your appointment, please contact [facility]  Dept: [phone]  and follow the prompts.  Office hours are 8:00 a.m. to 4:30 p.m. Monday - Friday. Please note that voicemails left after 4:00 p.m. may not be returned until the following business day.  We are closed weekends and major holidays. You have access to a nurse at all times for urgent questions. Please call the main number to the clinic Dept: [phone] and follow the prompts. ? ? ?For any non-urgent questions, you may also contact your provider using MyChart. We now offer e-Visits for anyone 18 and older to request care online for non-urgent symptoms. For details visit [email or web address]. ?  ?Also download the MyChart app! Go to the app store, search "MyChart", open the app, select [facility], and log in with your MyChart username and password. ? ?Due to Covid, a mask is required upon entering the hospital/clinic. If you do not have a mask, one will be given to you upon arrival. For doctor visits, patients may have 1 support person aged 18 or older with them. For treatment visits, patients cannot have anyone with them due to current Covid guidelines and our immunocompromised population.  ? ?

## 2021-01-22 NOTE — Progress Notes (Signed)
Nutrition Follow-up:  Patient has completed adjuvant radiation therapy for esophageal cancer. He is currently receiving Xeloda and Nivo.   Met with patient during infusion. He reports this has not been a good day for him. Patient reports poor toleration to potassium supplement which has been low secondary to Lasix. Patient reports unable to eat, nausea and vomiting after taking. He is stopping Lasix, potassium level is within normal limits, and will no longer need to take KCL. Patient reports drinking 4 Ensure Complete daily, eating soups and twice baked loaded potatoes. Patient reports  improving dysphagia, reports eating a Big Mac and his first [place] last week without difficulty. Patient reports dry mouth which is worse when he goes to bed. He is drinking 1 bottle of water through out the night. Patient reports constipation, having a bowel movement every 2 days. He is taking a stool softener.   Medications: klor-con (10%) soln,  carafate, xeloda  Labs: Glucose 207  Anthropometrics: Weight 158 lb 4.8 oz today stable x 2 weeks. Weights have decreased ~6.5 lbs (4%) in the last month.   10/10 - 158 lb 9.6 oz 9/29 - 164 lb 11.2 oz  9/13 - 166 lb   NUTRITION DIAGNOSIS: Inadequate oral intake continues    INTERVENTION:  Encouraged high calorie, high protein foods to promote weight gain Encouraged eating foods easy to chew and swallow as tolerated Continue drinking 4 Ensure Complete/equivalent daily - pt provided coupons Discussed strategies for dry mouth - pt has handout Suggested 4 oz prune juice for constipation Patient has contact information    MONITORING, EVALUATION, GOAL: weight trends, intake   NEXT VISIT: Monday November 21 during infusion with Pamala Hurry

## 2021-01-23 LAB — T4: T4, Total: 11.7 ug/dL (ref 4.5–12.0)

## 2021-01-24 NOTE — Progress Notes (Signed)
  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  Interval Since Last Radiation:  4 weeks    12/12/2020 through 12/27/2020 Site Technique Total Dose (Gy) Dose per Fx (Gy) Completed Fx Beam Energies  Esophagus: Esoph 3D 30/30 3 10/10 10X  Esophagus: Esoph_Bst 3D 6/6 3 2/2 10X   Narrative:  The patient was contacted today for routine follow-up. During treatment he did very well with radiotherapy and did not have significant desquamation. He reports he is doing better than at the last week of treatment and is starting to eat some soft foods again but his electrolytes were off balance for potassium recently and he's finally back to normal range. He continues to take carafate which is helpful in his opinion.  Impression/Plan: 1. 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 The patient has been doing well since completion of radiotherapy. We discussed that we would be happy to continue to follow him as needed, but he will also continue to follow up with Dr. Burr Medico in medical oncology. He will continue carafate for now but may discontinue at any time based on clinical improvement.     Carola Rhine, PAC

## 2021-01-27 DIAGNOSIS — L814 Other melanin hyperpigmentation: Secondary | ICD-10-CM | POA: Diagnosis not present

## 2021-01-27 DIAGNOSIS — D1801 Hemangioma of skin and subcutaneous tissue: Secondary | ICD-10-CM | POA: Diagnosis not present

## 2021-01-27 DIAGNOSIS — L821 Other seborrheic keratosis: Secondary | ICD-10-CM | POA: Diagnosis not present

## 2021-01-27 DIAGNOSIS — D485 Neoplasm of uncertain behavior of skin: Secondary | ICD-10-CM | POA: Diagnosis not present

## 2021-01-27 DIAGNOSIS — D225 Melanocytic nevi of trunk: Secondary | ICD-10-CM | POA: Diagnosis not present

## 2021-02-03 ENCOUNTER — Other Ambulatory Visit: Payer: Self-pay

## 2021-02-03 ENCOUNTER — Inpatient Hospital Stay: Payer: Medicare Other

## 2021-02-03 ENCOUNTER — Encounter: Payer: Self-pay | Admitting: Hematology

## 2021-02-03 ENCOUNTER — Inpatient Hospital Stay (HOSPITAL_BASED_OUTPATIENT_CLINIC_OR_DEPARTMENT_OTHER): Payer: Medicare Other | Admitting: Hematology

## 2021-02-03 ENCOUNTER — Inpatient Hospital Stay: Payer: Medicare Other | Attending: Nurse Practitioner

## 2021-02-03 VITALS — BP 134/68 | HR 87 | Temp 98.0°F | Resp 18 | Ht 71.0 in | Wt 161.7 lb

## 2021-02-03 DIAGNOSIS — Z5111 Encounter for antineoplastic chemotherapy: Secondary | ICD-10-CM | POA: Diagnosis not present

## 2021-02-03 DIAGNOSIS — K7460 Unspecified cirrhosis of liver: Secondary | ICD-10-CM | POA: Insufficient documentation

## 2021-02-03 DIAGNOSIS — R634 Abnormal weight loss: Secondary | ICD-10-CM | POA: Diagnosis not present

## 2021-02-03 DIAGNOSIS — C155 Malignant neoplasm of lower third of esophagus: Secondary | ICD-10-CM

## 2021-02-03 DIAGNOSIS — K222 Esophageal obstruction: Secondary | ICD-10-CM | POA: Diagnosis not present

## 2021-02-03 DIAGNOSIS — Z79899 Other long term (current) drug therapy: Secondary | ICD-10-CM | POA: Insufficient documentation

## 2021-02-03 DIAGNOSIS — C787 Secondary malignant neoplasm of liver and intrahepatic bile duct: Secondary | ICD-10-CM | POA: Insufficient documentation

## 2021-02-03 LAB — IRON AND TIBC
Iron: 77 ug/dL (ref 42–163)
Saturation Ratios: 23 % (ref 20–55)
TIBC: 343 ug/dL (ref 202–409)
UIBC: 265 ug/dL (ref 117–376)

## 2021-02-03 LAB — CMP (CANCER CENTER ONLY)
ALT: 13 U/L (ref 0–44)
AST: 22 U/L (ref 15–41)
Albumin: 3 g/dL — ABNORMAL LOW (ref 3.5–5.0)
Alkaline Phosphatase: 74 U/L (ref 38–126)
Anion gap: 7 (ref 5–15)
BUN: 17 mg/dL (ref 8–23)
CO2: 21 mmol/L — ABNORMAL LOW (ref 22–32)
Calcium: 8.8 mg/dL — ABNORMAL LOW (ref 8.9–10.3)
Chloride: 109 mmol/L (ref 98–111)
Creatinine: 0.9 mg/dL (ref 0.61–1.24)
GFR, Estimated: >60 mL/min (ref >60)
Glucose, Bld: 215 mg/dL — ABNORMAL HIGH (ref 70–99)
Potassium: 3.9 mmol/L (ref 3.5–5.1)
Sodium: 137 mmol/L (ref 135–145)
Total Bilirubin: 0.7 mg/dL (ref 0.3–1.2)
Total Protein: 6.3 g/dL — ABNORMAL LOW (ref 6.5–8.1)

## 2021-02-03 LAB — CBC WITH DIFFERENTIAL (CANCER CENTER ONLY)
Abs Immature Granulocytes: 0.01 10*3/uL (ref 0.00–0.07)
Basophils Absolute: 0 10*3/uL (ref 0.0–0.1)
Basophils Relative: 1 %
Eosinophils Absolute: 0.1 10*3/uL (ref 0.0–0.5)
Eosinophils Relative: 2 %
HCT: 33.3 % — ABNORMAL LOW (ref 39.0–52.0)
Hemoglobin: 11.2 g/dL — ABNORMAL LOW (ref 13.0–17.0)
Immature Granulocytes: 0 %
Lymphocytes Relative: 22 %
Lymphs Abs: 0.9 10*3/uL (ref 0.7–4.0)
MCH: 31.7 pg (ref 26.0–34.0)
MCHC: 33.6 g/dL (ref 30.0–36.0)
MCV: 94.3 fL (ref 80.0–100.0)
Monocytes Absolute: 0.4 10*3/uL (ref 0.1–1.0)
Monocytes Relative: 8 %
Neutro Abs: 2.8 10*3/uL (ref 1.7–7.7)
Neutrophils Relative %: 67 %
Platelet Count: 150 10*3/uL (ref 150–400)
RBC: 3.53 MIL/uL — ABNORMAL LOW (ref 4.22–5.81)
RDW: 19.9 % — ABNORMAL HIGH (ref 11.5–15.5)
WBC Count: 4.2 10*3/uL (ref 4.0–10.5)
nRBC: 0 % (ref 0.0–0.2)

## 2021-02-03 LAB — TSH: TSH: 1.434 u[IU]/mL (ref 0.320–4.118)

## 2021-02-03 LAB — FERRITIN: Ferritin: 51 ng/mL (ref 24–336)

## 2021-02-03 MED ORDER — SPIRONOLACTONE 50 MG PO TABS: 50.0000 mg | ORAL_TABLET | Freq: Every day | ORAL | 2 refills | Status: DC

## 2021-02-03 MED ORDER — SODIUM CHLORIDE 0.9 % IV SOLN: Freq: Once | INTRAVENOUS | Status: AC

## 2021-02-03 MED ORDER — SODIUM CHLORIDE 0.9 % IV SOLN
240.0000 mg | Freq: Once | INTRAVENOUS | Status: AC
  Administered 2021-02-03: 240 mg via INTRAVENOUS
  Filled 2021-02-03: qty 24

## 2021-02-03 NOTE — Patient Instructions (Signed)
[facility] Discharge Instructions for Patients Receiving Chemotherapy  Today you received the following chemotherapy agents opdivo   To help prevent nausea and vomiting after your treatment, we encourage you to take your nausea medication as directed.    If you develop nausea and vomiting that is not controlled by your nausea medication, call the clinic.   BELOW ARE SYMPTOMS THAT SHOULD BE REPORTED IMMEDIATELY: *FEVER GREATER THAN 100.5 F *CHILLS WITH OR WITHOUT FEVER NAUSEA AND VOMITING THAT IS NOT CONTROLLED WITH YOUR NAUSEA MEDICATION *UNUSUAL SHORTNESS OF BREATH *UNUSUAL BRUISING OR BLEEDING TENDERNESS IN MOUTH AND THROAT WITH OR WITHOUT PRESENCE OF ULCERS *URINARY PROBLEMS *BOWEL PROBLEMS UNUSUAL RASH Items with * indicate a potential emergency and should be followed up as soon as possible.  Feel free to call the clinic you have any questions or concerns. The clinic phone number is [phone].  Hypokalemia Hypokalemia means that the amount of potassium in the blood is lower than normal. Potassium is a chemical (electrolyte) that helps regulate the amount of fluid in the body. It also stimulates muscle tightening (contraction) and helps nerves work properly. Normally, most of the body's potassium is inside cells, and only a very small amount is in the blood. Because the amount in the blood is so small, minor changes to potassium levels in the blood can be life-threatening. What are the causes? This condition may be caused by: Antibiotic medicine. Diarrhea or vomiting. Taking too much of a medicine that helps you have a bowel movement (laxative) can cause diarrhea and lead to hypokalemia. Chronic kidney disease (CKD). Medicines that help the body get rid of excess fluid (diuretics). Eating disorders, such as bulimia. Low magnesium levels in the body. Sweating a lot. What are the signs or symptoms? Symptoms of this condition  include: Weakness. Constipation. Fatigue. Muscle cramps. Mental confusion. Skipped heartbeats or irregular heartbeat (palpitations). Tingling or numbness. How is this diagnosed? This condition is diagnosed with a blood test. How is this treated? This condition may be treated by: Taking potassium supplements by mouth. Adjusting the medicines that you take. Eating more foods that contain a lot of potassium. If your potassium level is very low, you may need to get potassium through an IV and be monitored in the hospital. Follow these instructions at home:  Take over-the-counter and prescription medicines only as told by your health care provider. This includes vitamins and supplements. Eat a healthy diet. A healthy diet includes fresh fruits and vegetables, whole grains, healthy fats, and lean proteins. If instructed, eat more foods that contain a lot of potassium. This includes: Nuts, such as peanuts and pistachios. Seeds, such as sunflower seeds and pumpkin seeds. Peas, lentils, and lima beans. Whole grain and bran cereals and breads. Fresh fruits and vegetables, such as apricots, avocado, bananas, cantaloupe, kiwi, oranges, tomatoes, asparagus, and potatoes. Orange juice. Tomato juice. Red meats. Yogurt. Keep all follow-up visits as told by your health care provider. This is important. Contact a health care provider if you: Have weakness that gets worse. Feel your heart pounding or racing. Vomit. Have diarrhea. Have diabetes (diabetes mellitus) and you have trouble keeping your blood sugar (glucose) in your target range. Get help right away if you: Have chest pain. Have shortness of breath. Have vomiting or diarrhea that lasts for more than 2 days. Faint. Summary Hypokalemia means that the amount of potassium in the blood is lower than normal. This condition is diagnosed with a blood test. Hypokalemia may be treated by  taking potassium supplements, adjusting the medicines  that you take, or eating more foods that are high in potassium. If your potassium level is very low, you may need to get potassium through an IV and be monitored in the hospital. This information is not intended to replace advice given to you by your health care provider. Make sure you discuss any questions you have with your health care provider. Document Revised: 10/26/2017 Document Reviewed: 10/27/2017 Elsevier Patient Education  [place].

## 2021-02-03 NOTE — Progress Notes (Signed)
Randy Wood   [phone] [phone]   Clinic Follow up Note   Patient Care Team: [name], [name], MD as PCP - General (Family Medicine) [name], MD as Consulting Physician (Gastroenterology) [name], MD as Consulting Physician (Hematology) [name], NP as Nurse Practitioner (Nurse Practitioner) [name], RN as Registered Nurse  Date of Service:  02/03/2021  CHIEF COMPLAINT: f/u of esophageal cancer  CURRENT THERAPY:  -Xeloda 1527m q12h, 1 week on/1 week off, starting 01/06/21  -Nivolumab q2weeks, to start 01/07/21  ASSESSMENT & PLAN:  Randy TAVESis a 80y.o. male with   1. Adenocarcinoma of the distal esophagus/GE junction, with liver and node metastasis, HER2-, MMR normal, PD-L1 3% -He presented with progressive dysphagia and weight loss, work-up showed moderate stenosis in the distal third of the esophagus invading the gastric cardia, path 11/25/20 confirmed poorly differentiated adenocarcinoma.   -PET 12/03/20 showed: esophageal tumor with local perigastric infiltration, hepatic, and nodal metastases; nodal involvement both in chest and abdomen extending to lower retroperitoneum; burst fracture at L5 with increased metabolic activity. -Molecular testing showed MMR normal, PD-L1 CPS 3% -He received palliative radiation therapy to primary tumor 9/15-9/30/22 -he began single agent Xeloda (oral), and nivolumab (IV) every 2 weeks on 01/07/21. He is tolerating well  -labs reviewed, adequate for treatment, plan to start cycle 3 nivolumab today, he started Xeloda this morning. -plan to change his Nivo to every 4 weeks in Jan if he continues to tolerate well    2.  Dysphagia, odynophagia, and weight loss -Onset early 2Feb 22, 2022after hospitalization for bacterial peritonitis, 40 lbs in 6 months  -in past few weeks he lost 15 lbs due to severe dysphagia and odynophagia. His weight has been stable since that drop. -EGD showed numerous  pills in middle/distal esophagus which were removed. -he is using nutrition supplements such as boost/Ensure, pured food, high caloric intake, and soft diet.    3.  Cirrhosis, abdominal pain -felt to be alcohol-related. He has abstained from alcohol since 202/22/22-S/p paracentesis 05/09/20 yielding 4.4 liters, path showed reactive mesothelial cells -Compensated, hepatic function panel 09/17/20 WNL -I have stopped his lasix, will continue spironolactone -followed by [facility]   4. Social support -He is widowed and lives alone, [place] she [place] treated here -2 daughters live out of town, [place] his primary contact for healthcare -He has church community and some family friends who could help if needed -Patient is independent with ADLs but has a hSecretary/administratorand yard service. I previously referred him to social work.   5. Family history  -Patient's mother had ovarian cancer, father had prostate cancer, and daughter [name] breast cancer at age 362-His daughter JSharyn Lullwithout cancer underwent genetic testing and is negative -He was referred to genetics and underwent testing on 12/09/20. Results were negative.       PLAN: -proceed to Nivo today -continue Xeloda, 1500 mg BID 1 week on/1 week off -I refilled his spironolactone -lab, f/u, and next Nivo as scheduled on 02/17/21 -move 03/10/21 appointment to 03/03/21   No problem-specific Assessment & Plan notes found for this encounter.   SUMMARY OF ONCOLOGIC HISTORY: Oncology History Overview Note  Cancer Staging No matching staging information was found for the patient.    Primary adenocarcinoma of distal third of esophagus ([facility]  11/11/2020 Procedure   EGD by Dr. MWatt Climes- Normal larynx. -?  Malignant-appearing esophageal stenosis. Biopsied. - Pills were found in the esophagus. Removal was successful. - Rule out malignancy,? gastric tumor in the cardia difficult to  say based on bleeding from passing the scope and unable to wash and suction for adequate visualization. - Normal ampulla, duodenal bulb, first portion of the duodenum, second portion of the duodenum, major papilla and area of the papilla. - The examination was otherwise normal.   11/11/2020 Initial Biopsy   FINAL MICROSCOPIC DIAGNOSIS:  A. ESOPHAGUS, DISTAL, BIOPSY:  -  Poorly differentiated adenocarcinoma  -  See comment    11/11/2020 Initial Diagnosis   Primary adenocarcinoma of distal third of esophagus ([place])   11/11/2020 Cancer Staging   Staging form: Esophagus - Adenocarcinoma, AJCC 8th Edition - Clinical stage from 11/11/2020: Stage IVB (cTX, cN1, cM1) - Signed by [name], MD on 12/26/2020    11/14/2020 Imaging   CT CAPIMPRESSION: 1. Aggressive appearing new tumor of the gastroesophageal junction confluent with the irregular collection of right gastric lymph nodes, and with new associated metastatic retroperitoneal adenopathy. Small paraesophageal lymph nodes in the thorax are increased in size from prior exam and could also be involved, and there is a nonspecific 1.0 cm lesion inferiorly in the right hepatic lobe which could potentially be metastatic. 2. New subacute burst (AO type A4) fracture of L5, with mild posterior bony retropulsion and with a dominant coronally oriented fracture plane, and about 50% loss of height. Correlate with interval trauma. 3. Slight thickening along the left paracolic gutter and trace pelvic ascites, although the ascites is markedly reduced compared to 05/04/2020. 4. Other imaging findings of potential clinical significance: Aortic Atherosclerosis (ICD10-I70.0). Coronary atherosclerosis. Old compression fracture at L2.   12/03/2020 PET scan   IMPRESSION: Tumor at the GE junction with local perigastric infiltration, hepatic and nodal metastases.   Nodal involvement both in the chest and abdomen extending to the lower retroperitoneum in the  abdomen.   Burst fracture at L5 shows signs of increased metabolic activity, nonspecific in the setting of fracture, in light of other findings would correlate with any history of trauma, if no history of trauma consider MRI to exclude the possibility of pathologic fracture.   12/27/2020 Genetic Testing   Negative hereditary cancer genetic testing: no pathogenic variants detected in [facility] Multi-Cancer +RNA Panel.  The report date is December 27, 2020.   The Multi-Cancer + RNA Panel offered by [facility] includes sequencing and/or deletion/duplication analysis of the following 84 genes:  AIP*, ALK, APC*, ATM*, AXIN2*, BAP1*, BARD1*, BLM*, BMPR1A*, BRCA1*, BRCA2*, BRIP1*, CASR, CDC73*, CDH1*, CDK4, CDKN1B*, CDKN1C*, CDKN2A, CEBPA, CHEK2*, CTNNA1*, DICER1*, DIS3L2*, EGFR, EPCAM, FH*, FLCN*, GATA2*, GPC3, GREM1, HOXB13, HRAS, KIT, MAX*, MEN1*, MET, MITF, MLH1*, MSH2*, MSH3*, MSH6*, MUTYH*, NBN*, NF1*, NF2*, NTHL1*, PALB2*, PDGFRA, PHOX2B, PMS2*, POLD1*, POLE*, POT1*, PRKAR1A*, PTCH1*, PTEN*, RAD50*, RAD51C*, RAD51D*, RB1*, RECQL4, RET, RUNX1*, SDHA*, SDHAF2*, SDHB*, SDHC*, SDHD*, SMAD4*, SMARCA4*, SMARCB1*, SMARCE1*, STK11*, SUFU*, TERC, TERT, TMEM127*, Tp53*, TSC1*, TSC2*, VHL*, WRN*, and WT1.  RNA analysis is performed for * genes.   01/07/2021 -  Chemotherapy   Patient is on Treatment Plan : GASTROESOPHAGEAL Nivolumab q14d x 8 cycles / Nivolumab q28d        INTERVAL HISTORY:  IREN WHIPP is here for a follow up of esophageal cancer. He was last seen by me on 01/22/21. He presents to the clinic accompanied by his daughter. He reports a number of his issues have resolved since stopping the KCL. He denies any noticeable side effects from  chemo.   All other systems were reviewed with the patient and are negative.  MEDICAL HISTORY:  Past Medical History:  Diagnosis Date   AKI (acute kidney injury) ([place])    Alcohol abuse    Arthritis    Cataract 2006   Diarrhea    Edema leg    Esophageal  cancer ([facility])    Family history of breast cancer 12/09/2020   Family history of ovarian cancer 12/09/2020   Family history of prostate cancer 12/09/2020   GERD (gastroesophageal reflux disease)    Glaucoma unknown   Hyperbilirubinemia    Hyperlipemia    Hypertension    Hypokalemia    Liver failure (H[facility])    Macular degeneration, wet ([facility])    Microcytic anemia    Osteoarthritis    SOB (shortness of breath)    Weight gain     SURGICAL HISTORY: Past Surgical History:  Procedure Laterality Date   ANKLE SURGERY     BIOPSY  11/11/2020   Procedure: BIOPSY;  Surgeon: [name], MD;  Location: [facility];  Service: Gastroenterology;;   CATARACT EXTRACTION, BILATERAL     ESOPHAGOGASTRODUODENOSCOPY (EGD) WITH PROPOFOL N/A 11/11/2020   Procedure: ESOPHAGOGASTRODUODENOSCOPY (EGD) WITH PROPOFOL;  Surgeon: [name], MD;  Location: [facility];  Service: Gastroenterology;  Laterality: N/A;   EYE SURGERY  2006   FOREIGN BODY REMOVAL  11/11/2020   Procedure: FOREIGN BODY REMOVAL;  Surgeon: [name], MD;  Location: [facility];  Service: Gastroenterology;;   IR PARACENTESIS  05/09/2020   TONSILLECTOMY      I have reviewed the social history and family history with the patient and they are unchanged from previous note.  ALLERGIES:  is allergic to lisinopril.  MEDICATIONS:  Current Outpatient Medications  Medication Sig Dispense Refill   brimonidine (ALPHAGAN) 0.2 % ophthalmic solution Place 1 drop into the left eye 3 (three) times daily.     capecitabine (XELODA) 500 MG tablet Take 3 tabs in the morning and evening, 30 minutes after meal,  for 7 days on and 7 days off 84 tablet 0   cetirizine (ZYRTEC) 10 MG tablet Take 10 mg by mouth daily.     dorzolamide-timolol (COSOPT) 22.3-6.8 MG/ML ophthalmic solution Place 1 drop into the left eye 2 (two) times daily.     EPINEPHrine 0.3 mg/0.3 mL IJ SOAJ injection Inject 0.3 mg into the muscle as needed for anaphylaxis.     ezetimibe  (ZETIA) 10 MG tablet Take 1 tablet (10 mg total) by mouth daily. 90 tablet 3   fluticasone (FLONASE) 50 MCG/ACT nasal spray Place 1 spray into both nostrils daily as needed for allergies.     GLUCOSAMINE-CHONDROITIN PO Take 1 capsule by mouth daily.     lactulose (CHRONULAC) 10 GM/15ML solution Take 15 mLs (10 g total) by mouth daily as needed for mild constipation. 236 mL 0   latanoprost (XALATAN) 0.005 % ophthalmic solution Place 1 drop into the left eye daily.     Multiple Vitamins-Minerals (ONE-A-DAY MENS 50+) TABS Take 1 tablet by mouth daily.     Multiple Vitamins-Minerals (PRESERVISION AREDS 2 PO) Take 1 tablet by mouth 2 (two) times daily.     Netarsudil Dimesylate (RHOPRESSA) 0.02 % SOLN Place 1 drop into the left eye daily.     spironolactone (ALDACTONE) 50 MG tablet Take 1 tablet (50 mg total) by mouth daily. 30 tablet 2   sucralfate (CARAFATE) 1 g tablet Take 1 tablet (1 g total) by mouth 4 (four) times daily. Dissolve  each tablet in 15 cc water before use. 120 tablet 2   No current facility-administered medications for this visit.    PHYSICAL EXAMINATION: ECOG PERFORMANCE STATUS: 1 - Symptomatic but completely ambulatory  Vitals:   02/03/21 1110  BP: 134/68  Pulse: 87  Resp: 18  Temp: 98 F (36.7 C)  SpO2: 100%   Wt Readings from Last 3 Encounters:  02/03/21 161 lb 11.2 oz (73.3 kg)  01/22/21 158 lb 4.8 oz (71.8 kg)  01/06/21 158 lb 9.6 oz (71.9 kg)     GENERAL:alert, no distress and comfortable SKIN: skin color normal, no rashes or significant lesions EYES: normal, Conjunctiva are pink and non-injected, sclera clear  NEURO: alert & oriented x 3 with fluent speech  LABORATORY DATA:  I have reviewed the data as listed CBC Latest Ref Rng & Units 02/03/2021 01/22/2021 01/06/2021  WBC 4.0 - 10.5 K/uL 4.2 5.2 5.7  Hemoglobin 13.0 - 17.0 g/dL 11.2(L) 11.7(L) 11.6(L)  Hematocrit 39.0 - 52.0 % 33.3(L) 35.1(L) 35.2(L)  Platelets 150 - 400 K/uL 150 148(L) 150     CMP  Latest Ref Rng & Units 02/03/2021 01/22/2021 01/06/2021  Glucose 70 - 99 mg/dL 215(H) 207(H) 206(H)  BUN 8 - 23 mg/dL 17 20 32(H)  Creatinine 0.61 - 1.24 mg/dL 0.90 0.97 1.14  Sodium 135 - 145 mmol/L 137 140 137  Potassium 3.5 - 5.1 mmol/L 3.9 3.7 2.5(LL)  Chloride 98 - 111 mmol/L 109 109 97(L)  CO2 22 - 32 mmol/L 21(L) 19(L) 27  Calcium 8.9 - 10.3 mg/dL 8.8(L) 9.5 9.2  Total Protein 6.5 - 8.1 g/dL 6.3(L) 7.1 7.1  Total Bilirubin 0.3 - 1.2 mg/dL 0.7 0.8 0.5  Alkaline Phos 38 - 126 U/L 74 80 66  AST 15 - 41 U/L _0 ALT 0 - 44 U/L _1 RADIOGRAPHIC STUDIES: I have personally reviewed the radiological images as listed and agreed with the findings in the report. No results found.    No orders of the defined types were placed in this encounter.  All questions were answered. The patient knows to call the clinic with any problems, questions or concerns. No barriers to learning was detected. The total time spent in the appointment was 30 minutes.     [name], MD 02/03/2021   I, Wilburn Mylar, am acting as scribe for [name], MD.   I have reviewed the above documentation for accuracy and completeness, and I agree with the above.

## 2021-02-04 LAB — T4: T4, Total: 10 ug/dL (ref 4.5–12.0)

## 2021-02-05 DIAGNOSIS — Z961 Presence of intraocular lens: Secondary | ICD-10-CM | POA: Insufficient documentation

## 2021-02-05 DIAGNOSIS — H353211 Exudative age-related macular degeneration, right eye, with active choroidal neovascularization: Secondary | ICD-10-CM | POA: Insufficient documentation

## 2021-02-05 DIAGNOSIS — H353132 Nonexudative age-related macular degeneration, bilateral, intermediate dry stage: Secondary | ICD-10-CM | POA: Diagnosis not present

## 2021-02-05 DIAGNOSIS — H401123 Primary open-angle glaucoma, left eye, severe stage: Secondary | ICD-10-CM | POA: Insufficient documentation

## 2021-02-05 DIAGNOSIS — H401111 Primary open-angle glaucoma, right eye, mild stage: Secondary | ICD-10-CM | POA: Diagnosis not present

## 2021-02-05 DIAGNOSIS — H26492 Other secondary cataract, left eye: Secondary | ICD-10-CM | POA: Diagnosis not present

## 2021-02-07 ENCOUNTER — Telehealth: Payer: Self-pay

## 2021-02-07 NOTE — Telephone Encounter (Signed)
Anderson Malta called from [facility] [phone]) on behalf of Dr. Harriett Sine requesting medical clearance for a dermatological procedure Dr. Elvera Lennox would like to perform on a lesion the pt has.  Returned Jennifer's call but got her voicemail.  LVM for Anderson Malta or Dr. Juanna Cao nurse to contact Dr. Ernestina Penna office with more detail regarding the procedure.  Awaiting [facility]'s return phone call.

## 2021-02-11 ENCOUNTER — Telehealth: Payer: Self-pay | Admitting: *Deleted

## 2021-02-11 NOTE — Telephone Encounter (Signed)
Anderson Malta called from [facility] seeking clearance for abnormal mole to be removed from skin. Per Dr.Feng, OK to have removed but advise pt have labs drawn before mole removal. Call and left vm for [facility] at [facility]. Awaiting callback for further questions

## 2021-02-11 NOTE — Telephone Encounter (Signed)
Anderson Malta from [facility] called. Mole removal will be 11/29. They will wait for pt labs to be done for 11/21 appointment before proceeding. Anderson Malta assured that removal is not invasive and is right underneath the pt skin.

## 2021-02-12 ENCOUNTER — Encounter (HOSPITAL_BASED_OUTPATIENT_CLINIC_OR_DEPARTMENT_OTHER): Payer: Self-pay | Admitting: Family Medicine

## 2021-02-12 ENCOUNTER — Other Ambulatory Visit: Payer: Self-pay

## 2021-02-12 ENCOUNTER — Ambulatory Visit (INDEPENDENT_AMBULATORY_CARE_PROVIDER_SITE_OTHER): Payer: Medicare Other | Admitting: Family Medicine

## 2021-02-12 DIAGNOSIS — J349 Unspecified disorder of nose and nasal sinuses: Secondary | ICD-10-CM | POA: Insufficient documentation

## 2021-02-12 NOTE — Patient Instructions (Signed)
  Medication Instructions:  Your physician recommends that you continue on your current medications as directed. Please refer to the Current Medication list given to you today. --If you need a refill on any your medications before your next appointment, please call your pharmacy first. If no refills are authorized on file call the office.--  Follow-Up: Your next appointment:   Your physician recommends that you schedule a follow-up appointment in: 2 MONTHS with Dr. [name]  You will receive a text message or e-mail with a link to a survey about your care and experience with [place] today! We would greatly appreciate your feedback!   Thanks for letting us be apart of your health journey!!  Primary Care and Sports Medicine   Dr. Arlina Robes [place]   We encourage you to activate your patient portal called "MyChart".  Sign up information is provided on this After Visit Summary.  MyChart is used to connect with patients for Virtual Visits (Telemedicine).  Patients are able to view lab/test results, encounter notes, upcoming appointments, etc.  Non-urgent messages can be sent to your provider as well. To learn more about what you can do with MyChart, please visit --  [email or web address].

## 2021-02-12 NOTE — Progress Notes (Signed)
    Procedures performed today:    None.  Independent interpretation of notes and tests performed by another provider:   None.  Brief History, Exam, Impression, and Recommendations:    BP (!) 106/50   Pulse 87   Ht 5\' 11"  (1.803 m)   Wt 162 lb (73.5 kg)   SpO2 96%   BMI 22.59 kg/m   Sinus disease Reports intermittent issues with allergic rhinitis/sinuses in the past.  Has been having some issues recently Has used some medication in the past including Zyrtec as well as Flonase Recommend that he utilize these on a regular basis for the next few weeks to see if this helps with symptoms, could also consider use of intranasal saline spray to help with symptoms  He continues to follow with oncology plan is for him to complete monthly infusions/follow-up visit and continue with oral medication.  Plan for follow-up in about 2 months or sooner as needed   ___________________________________________ Randy Shappell [name], MD, ABFM, [facility] and [place]

## 2021-02-16 NOTE — Progress Notes (Signed)
[facility]   [phone] [phone]   Clinic Follow up Note   Patient Care Team: [name], [name], MD as PCP - General (Family Medicine) [name], MD as Consulting Physician (Gastroenterology) [name], MD as Consulting Physician (Hematology) [name], NP as Nurse Practitioner (Nurse Practitioner) [name], RN as Registered Nurse 02/17/2021  CHIEF COMPLAINT: Follow up esophageal cancer   SUMMARY OF ONCOLOGIC HISTORY: Oncology History Overview Note  Cancer Staging No matching staging information was found for the patient.    Primary adenocarcinoma of distal third of esophagus ([place])  11/11/2020 Procedure   EGD by Dr. Watt Climes - Normal larynx. -? Malignant-appearing esophageal stenosis. Biopsied. - Pills were found in the esophagus. Removal was successful. - Rule out malignancy,? gastric tumor in the cardia difficult to say based on bleeding from passing the scope and unable to wash and suction for adequate visualization. - Normal ampulla, duodenal bulb, first portion of the duodenum, second portion of the duodenum, major papilla and area of the papilla. - The examination was otherwise normal.   11/11/2020 Initial Biopsy   FINAL MICROSCOPIC DIAGNOSIS:  A. ESOPHAGUS, DISTAL, BIOPSY:  -  Poorly differentiated adenocarcinoma  -  See comment    11/11/2020 Initial Diagnosis   Primary adenocarcinoma of distal third of esophagus ([place])   11/11/2020 Cancer Staging   Staging form: Esophagus - Adenocarcinoma, AJCC 8th Edition - Clinical stage from 11/11/2020: Stage IVB (cTX, cN1, cM1) - Signed by [name], MD on 12/26/2020    11/14/2020 Imaging   CT CAPIMPRESSION: 1. Aggressive appearing new tumor of the gastroesophageal junction confluent with the irregular collection of right gastric lymph nodes, and with new associated metastatic retroperitoneal adenopathy. Small paraesophageal lymph nodes in the thorax are increased in size  from prior exam and could also be involved, and there is a nonspecific 1.0 cm lesion inferiorly in the right hepatic lobe which could potentially be metastatic. 2. New subacute burst (AO type A4) fracture of L5, with mild posterior bony retropulsion and with a dominant coronally oriented fracture plane, and about 50% loss of height. Correlate with interval trauma. 3. Slight thickening along the left paracolic gutter and trace pelvic ascites, although the ascites is markedly reduced compared to 05/04/2020. 4. Other imaging findings of potential clinical significance: Aortic Atherosclerosis (ICD10-I70.0). Coronary atherosclerosis. Old compression fracture at L2.   12/03/2020 PET scan   IMPRESSION: Tumor at the GE junction with local perigastric infiltration, hepatic and nodal metastases.   Nodal involvement both in the chest and abdomen extending to the lower retroperitoneum in the abdomen.   Burst fracture at L5 shows signs of increased metabolic activity, nonspecific in the setting of fracture, in light of other findings would correlate with any history of trauma, if no history of trauma consider MRI to exclude the possibility of pathologic fracture.   12/27/2020 Genetic Testing   Negative hereditary cancer genetic testing: no pathogenic variants detected in [facility] Multi-Cancer +RNA Panel.  The report date is December 27, 2020.   The Multi-Cancer + RNA Panel offered by [facility] includes sequencing and/or deletion/duplication analysis of the following 84 genes:  AIP*, ALK, APC*, ATM*, AXIN2*, BAP1*, BARD1*, BLM*, BMPR1A*, BRCA1*, BRCA2*, BRIP1*, CASR, CDC73*, CDH1*, CDK4, CDKN1B*, CDKN1C*, CDKN2A, CEBPA, CHEK2*, CTNNA1*, DICER1*, DIS3L2*, EGFR, EPCAM, FH*, FLCN*, GATA2*, GPC3, GREM1, HOXB13, HRAS, KIT, MAX*, MEN1*, MET, MITF, MLH1*, MSH2*, MSH3*, MSH6*, MUTYH*, NBN*, NF1*, NF2*, NTHL1*, PALB2*, PDGFRA, PHOX2B, PMS2*, POLD1*, POLE*, POT1*, PRKAR1A*, PTCH1*, PTEN*, RAD50*, RAD51C*, RAD51D*,  RB1*, RECQL4, RET, RUNX1*, SDHA*, SDHAF2*, SDHB*, SDHC*, SDHD*, SMAD4*, SMARCA4*, SMARCB1*, SMARCE1*, STK11*, SUFU*, TERC, TERT, TMEM127*, Tp53*, TSC1*, TSC2*, VHL*, WRN*, and WT1.  RNA analysis is performed for * genes.   01/07/2021 -  Chemotherapy   Patient is on Treatment Plan : GASTROESOPHAGEAL Nivolumab q14d x 8 cycles / Nivolumab q28d       CURRENT THERAPY: Xeloda 1500 mg q12h 1 week on/1 week off, starting 01/06/21 and Nivolumab q2 weeks starting 01/07/21  INTERVAL HISTORY: Mr. Krider returns for follow up and treatment as scheduled. Last seen 02/03/21 and completed cycle 3 Nivo, he continues xeloda 1 week on/1 week off, begin the current cycle this morning.  He feels well overall, tolerating more regular food since he began treatment.  He ate a chicken salad croissant recently.  He has occasional pain with the first bites but resolves.  He does generate some mucus, manageable with Pepcid.  He finds it easier to eat without Carafate so he does not take that.  Appetite is improved, has good energy level, he is going out to the grocery store and church with naps. He notes "altered texture" of his hands without numbness, tingling, erythema, or pain.  He notes the arthritic pain in his right hand resolved.  Off Lasix he has no recurrent ankle edema or cough.  Denies recent fever, chills, nausea/vomiting, constipation or diarrhea, new or worsening pain, or any other specific complaints.    He is having laser surgery on his eye next week and a skin biopsy 12/6.   MEDICAL HISTORY:  Past Medical History:  Diagnosis Date   AKI (acute kidney injury) ([place])    Alcohol abuse    Arthritis    Cataract 2006   Diarrhea    Edema leg    Esophageal cancer ([place])    Family history of breast cancer 12/09/2020   Family history of ovarian cancer 12/09/2020   Family history of prostate cancer 12/09/2020   GERD (gastroesophageal reflux disease)    Glaucoma unknown   Hyperbilirubinemia    Hyperlipemia     Hypertension    Hypokalemia    Liver failure ([facility])    Macular degeneration, wet ([facility])    Microcytic anemia    Osteoarthritis    SOB (shortness of breath)    Weight gain     SURGICAL HISTORY: Past Surgical History:  Procedure Laterality Date   ANKLE SURGERY     BIOPSY  11/11/2020   Procedure: BIOPSY;  Surgeon: [name], MD;  Location: [facility];  Service: Gastroenterology;;   CATARACT EXTRACTION, BILATERAL     ESOPHAGOGASTRODUODENOSCOPY (EGD) WITH PROPOFOL N/A 11/11/2020   Procedure: ESOPHAGOGASTRODUODENOSCOPY (EGD) WITH PROPOFOL;  Surgeon: [name], MD;  Location: [facility];  Service: Gastroenterology;  Laterality: N/A;   EYE SURGERY  2006   FOREIGN BODY REMOVAL  11/11/2020   Procedure: FOREIGN BODY REMOVAL;  Surgeon: [name], MD;  Location: [facility];  Service: Gastroenterology;;   IR PARACENTESIS  05/09/2020   TONSILLECTOMY      I have reviewed the social history and family history with the patient and they are unchanged from previous note.  ALLERGIES:  is allergic to lisinopril.  MEDICATIONS:  Current Outpatient Medications  Medication Sig Dispense Refill   brimonidine (ALPHAGAN) 0.2 % ophthalmic solution Place 1 drop into the left eye 3 (three) times daily.     capecitabine (XELODA) 500 MG tablet Take 3 tabs in the morning and evening, 30 minutes after meal,  for 7 days on and  7 days off 84 tablet 0   cetirizine (ZYRTEC) 10 MG tablet Take 10 mg by mouth daily.     dorzolamide-timolol (COSOPT) 22.3-6.8 MG/ML ophthalmic solution Place 1 drop into the left eye 2 (two) times daily.     EPINEPHrine 0.3 mg/0.3 mL IJ SOAJ injection Inject 0.3 mg into the muscle as needed for anaphylaxis.     ezetimibe (ZETIA) 10 MG tablet Take 1 tablet (10 mg total) by mouth daily. 90 tablet 3   fluticasone (FLONASE) 50 MCG/ACT nasal spray Place 1 spray into both nostrils daily as needed for allergies.     GLUCOSAMINE-CHONDROITIN PO Take 1 capsule by mouth daily.     lactulose  (CHRONULAC) 10 GM/15ML solution Take 15 mLs (10 g total) by mouth daily as needed for mild constipation. 236 mL 0   latanoprost (XALATAN) 0.005 % ophthalmic solution Place 1 drop into the left eye daily.     Multiple Vitamins-Minerals (ONE-A-DAY MENS 50+) TABS Take 1 tablet by mouth daily.     Multiple Vitamins-Minerals (PRESERVISION AREDS 2 PO) Take 1 tablet by mouth 2 (two) times daily.     Netarsudil Dimesylate (RHOPRESSA) 0.02 % SOLN Place 1 drop into the left eye daily.     spironolactone (ALDACTONE) 50 MG tablet Take 1 tablet (50 mg total) by mouth daily. 30 tablet 2   sucralfate (CARAFATE) 1 g tablet Take 1 tablet (1 g total) by mouth 4 (four) times daily. Dissolve each tablet in 15 cc water before use. 120 tablet 2   No current facility-administered medications for this visit.    PHYSICAL EXAMINATION: ECOG PERFORMANCE STATUS: 1 - Symptomatic but completely ambulatory  Vitals:   02/17/21 1226  BP: (!) 119/57  Pulse: 71  Resp: 18  Temp: (!) 97 F (36.1 C)  SpO2: 100%   Filed Weights   02/17/21 1226  Weight: 159 lb 3 oz (72.2 kg)    GENERAL:alert, no distress and comfortable SKIN: no rash  EYES:  sclera clear LUNGS: clear with normal breathing effort HEART: regular rate & rhythm, no lower extremity edema NEURO: alert & oriented x 3 with fluent speech, no focal motor deficits  LABORATORY DATA:  I have reviewed the data as listed CBC Latest Ref Rng & Units 02/17/2021 02/03/2021 01/22/2021  WBC 4.0 - 10.5 K/uL 4.2 4.2 5.2  Hemoglobin 13.0 - 17.0 g/dL 11.8(L) 11.2(L) 11.7(L)  Hematocrit 39.0 - 52.0 % 35.6(L) 33.3(L) 35.1(L)  Platelets 150 - 400 K/uL 156 150 148(L)     CMP Latest Ref Rng & Units 02/17/2021 02/03/2021 01/22/2021  Glucose 70 - 99 mg/dL 104(H) 215(H) 207(H)  BUN 8 - 23 mg/dL _0 Creatinine 0.61 - 1.24 mg/dL 0.87 0.90 0.97  Sodium 135 - 145 mmol/L 140 137 140  Potassium 3.5 - 5.1 mmol/L 3.8 3.9 3.7  Chloride 98 - 111 mmol/L 114(H) 109 109  CO2 22 -  32 mmol/L 19(L) 21(L) 19(L)  Calcium 8.9 - 10.3 mg/dL 9.0 8.8(L) 9.5  Total Protein 6.5 - 8.1 g/dL 6.7 6.3(L) 7.1  Total Bilirubin 0.3 - 1.2 mg/dL 0.7 0.7 0.8  Alkaline Phos 38 - 126 U/L 70 74 80  AST 15 - 41 U/L _1 ALT 0 - 44 U/L _2 RADIOGRAPHIC STUDIES: I have personally reviewed the radiological images as listed and agreed with the findings in the report. No results found.   ASSESSMENT & PLAN: 80 year old male   1.Locally advanced adenocarcinoma of  the distal esophagus/GE junction, with liver and nodal metastasis, HER2 negative, MMR normal, PD-L1 3% -He presented with progressive dysphagia and weight loss, work-up showed moderate stenosis in the distal third of the esophagus invading the gastric cardia, path 11/25/2020 confirmed poorly differentiated adenocarcinoma.  -PET 12/03/2020 showed esophageal tumor with local perigastric infiltration, hepatic and nodal metastasis involving the chest and abdomen extending to lower retroperitoneum.  A burst fracture at L5 with increased metabolic activity -Molecular testing showed MMR normal, PD-L1 CPS 3% -He received palliative radiation to the primary tumor 9/15 - 12/27/2020 -He began single agent Xeloda and nivolumab every 2 weeks on 01/07/2021, tolerating well -Dysphagia has improved on treatment, indicating a clinical response   2.  Dysphagia, odynophagia, and weight loss -Onset early 05-12-2020 after hospitalization for bacterial peritonitis, 40 lbs in 6 months  -in past few weeks he lost 15 lbs due to severe dysphagia and odynophagia -EGD showed numerous pills in middle/distal esophagus which were removed. -Encouraged to increase nutrition supplements such as boost/Ensure, pured food, high caloric intake, and soft diet.   -Weight stable overall, continue follow-up with dietitian -Dysphagia improved on treatment, indicating clinical response to therapy  3.  Cirrhosis, abdominal pain -felt to be alcohol-related. He has  abstained from alcohol since 05-12-2020 -S/p paracentesis 05/09/2020 yielding 4.4 liters, path showed reactive mesothelial cells -Compensated, hepatic function panel 09/17/2020 WNL -Continue spironolactone.  Hold Lasix -followed by GI   4.Social support -He is widowed and lives alone, his wife died from [place] in 2015/05/13, she was treated here -2 daughters live out of town, [name] lives in [place] and is the closest, also his primary contact for healthcare -He has church community and some family friends who could help if needed -Patient is independent with ADLs but has a Secretary/administrator and yard service.  -Previously referred to social work  5.Family history  -Patient's mother had ovarian cancer, father had prostate cancer, and a daughter has breast cancer at age 52 -His daughter [name] without cancer underwent genetic testing and is negative -He was referred to genetics and underwent testing 12/09/2020, results were negative  Disposition: Mr. Zumstein appears stable.  He continues q2 week nivolumab and Xeloda 1500 mg twice daily 7 days on and 7 days off.  He tolerates treatment well overall without significant side effects.  He is able to recover and function well.  Dysphagia has improved, indicating a clinical response to therapy.  Labs reviewed, adequate to proceed with another cycle of nivolumab and Xeloda today as planned.  Follow-up in 2 weeks with next cycle, or sooner if needed.  All questions were answered. The patient knows to call the clinic with any problems, questions or concerns. No barriers to learning were detected.      [name], NP 02/17/21

## 2021-02-17 ENCOUNTER — Encounter: Payer: Self-pay | Admitting: Nurse Practitioner

## 2021-02-17 ENCOUNTER — Inpatient Hospital Stay: Payer: Medicare Other | Admitting: Nutrition

## 2021-02-17 ENCOUNTER — Inpatient Hospital Stay (HOSPITAL_BASED_OUTPATIENT_CLINIC_OR_DEPARTMENT_OTHER): Payer: Medicare Other | Admitting: Nurse Practitioner

## 2021-02-17 ENCOUNTER — Inpatient Hospital Stay: Payer: Medicare Other

## 2021-02-17 ENCOUNTER — Other Ambulatory Visit: Payer: Self-pay

## 2021-02-17 VITALS — BP 119/57 | HR 71 | Temp 97.0°F | Resp 18 | Wt 159.2 lb

## 2021-02-17 DIAGNOSIS — C155 Malignant neoplasm of lower third of esophagus: Secondary | ICD-10-CM

## 2021-02-17 DIAGNOSIS — Z5111 Encounter for antineoplastic chemotherapy: Secondary | ICD-10-CM | POA: Diagnosis not present

## 2021-02-17 DIAGNOSIS — C787 Secondary malignant neoplasm of liver and intrahepatic bile duct: Secondary | ICD-10-CM | POA: Diagnosis not present

## 2021-02-17 DIAGNOSIS — R634 Abnormal weight loss: Secondary | ICD-10-CM | POA: Diagnosis not present

## 2021-02-17 DIAGNOSIS — K7460 Unspecified cirrhosis of liver: Secondary | ICD-10-CM | POA: Diagnosis not present

## 2021-02-17 DIAGNOSIS — K222 Esophageal obstruction: Secondary | ICD-10-CM | POA: Diagnosis not present

## 2021-02-17 LAB — CBC WITH DIFFERENTIAL (CANCER CENTER ONLY)
Abs Immature Granulocytes: 0.03 10*3/uL (ref 0.00–0.07)
Basophils Absolute: 0.1 10*3/uL (ref 0.0–0.1)
Basophils Relative: 1 %
Eosinophils Absolute: 0.1 10*3/uL (ref 0.0–0.5)
Eosinophils Relative: 2 %
HCT: 35.6 % — ABNORMAL LOW (ref 39.0–52.0)
Hemoglobin: 11.8 g/dL — ABNORMAL LOW (ref 13.0–17.0)
Immature Granulocytes: 1 %
Lymphocytes Relative: 26 %
Lymphs Abs: 1.1 10*3/uL (ref 0.7–4.0)
MCH: 32.4 pg (ref 26.0–34.0)
MCHC: 33.1 g/dL (ref 30.0–36.0)
MCV: 97.8 fL (ref 80.0–100.0)
Monocytes Absolute: 0.5 10*3/uL (ref 0.1–1.0)
Monocytes Relative: 13 %
Neutro Abs: 2.4 10*3/uL (ref 1.7–7.7)
Neutrophils Relative %: 57 %
Platelet Count: 156 10*3/uL (ref 150–400)
RBC: 3.64 MIL/uL — ABNORMAL LOW (ref 4.22–5.81)
RDW: 20.6 % — ABNORMAL HIGH (ref 11.5–15.5)
WBC Count: 4.2 10*3/uL (ref 4.0–10.5)
nRBC: 0 % (ref 0.0–0.2)

## 2021-02-17 LAB — FERRITIN: Ferritin: 34 ng/mL (ref 24–336)

## 2021-02-17 LAB — CMP (CANCER CENTER ONLY)
ALT: 13 U/L (ref 0–44)
AST: 21 U/L (ref 15–41)
Albumin: 3.3 g/dL — ABNORMAL LOW (ref 3.5–5.0)
Alkaline Phosphatase: 70 U/L (ref 38–126)
Anion gap: 7 (ref 5–15)
BUN: 13 mg/dL (ref 8–23)
CO2: 19 mmol/L — ABNORMAL LOW (ref 22–32)
Calcium: 9 mg/dL (ref 8.9–10.3)
Chloride: 114 mmol/L — ABNORMAL HIGH (ref 98–111)
Creatinine: 0.87 mg/dL (ref 0.61–1.24)
GFR, Estimated: >60 mL/min (ref >60)
Glucose, Bld: 104 mg/dL — ABNORMAL HIGH (ref 70–99)
Potassium: 3.8 mmol/L (ref 3.5–5.1)
Sodium: 140 mmol/L (ref 135–145)
Total Bilirubin: 0.7 mg/dL (ref 0.3–1.2)
Total Protein: 6.7 g/dL (ref 6.5–8.1)

## 2021-02-17 LAB — IRON AND TIBC
Iron: 78 ug/dL (ref 42–163)
Saturation Ratios: 20 % (ref 20–55)
TIBC: 385 ug/dL (ref 202–409)
UIBC: 307 ug/dL (ref 117–376)

## 2021-02-17 LAB — TSH: TSH: 1.818 u[IU]/mL (ref 0.320–4.118)

## 2021-02-17 MED ORDER — SODIUM CHLORIDE 0.9 % IV SOLN: Freq: Once | INTRAVENOUS | Status: AC

## 2021-02-17 MED ORDER — SODIUM CHLORIDE 0.9 % IV SOLN
240.0000 mg | Freq: Once | INTRAVENOUS | Status: AC
  Administered 2021-02-17: 240 mg via INTRAVENOUS
  Filled 2021-02-17: qty 24

## 2021-02-17 NOTE — Patient Instructions (Signed)
[facility]  Discharge Instructions: Thank you for choosing [facility] to provide your oncology and hematology care.   If you have a lab appointment with the [facility], please go directly to the [facility] and check in at the registration area.   Wear comfortable clothing and clothing appropriate for easy access to any Portacath or PICC line.   We strive to give you quality time with your provider. You may need to reschedule your appointment if you arrive late (15 or more minutes).  Arriving late affects you and other patients whose appointments are after yours.  Also, if you miss three or more appointments without notifying the office, you may be dismissed from the clinic at the provider's discretion.      For prescription refill requests, have your pharmacy contact our office and allow 72 hours for refills to be completed.    Today you received the following chemotherapy and/or immunotherapy agent: Nivolumab (opdivo)   To help prevent nausea and vomiting after your treatment, we encourage you to take your nausea medication as directed.  BELOW ARE SYMPTOMS THAT SHOULD BE REPORTED IMMEDIATELY: *FEVER GREATER THAN 100.4 F (38 C) OR HIGHER *CHILLS OR SWEATING *NAUSEA AND VOMITING THAT IS NOT CONTROLLED WITH YOUR NAUSEA MEDICATION *UNUSUAL SHORTNESS OF BREATH *UNUSUAL BRUISING OR BLEEDING *URINARY PROBLEMS (pain or burning when urinating, or frequent urination) *BOWEL PROBLEMS (unusual diarrhea, constipation, pain near the anus) TENDERNESS IN MOUTH AND THROAT WITH OR WITHOUT PRESENCE OF ULCERS (sore throat, sores in mouth, or a toothache) UNUSUAL RASH, SWELLING OR PAIN  UNUSUAL VAGINAL DISCHARGE OR ITCHING   Items with * indicate a potential emergency and should be followed up as soon as possible or go to the Emergency Department if any problems should occur.  Please show the CHEMOTHERAPY ALERT CARD or IMMUNOTHERAPY ALERT CARD at  check-in to the Emergency Department and triage nurse.  Should you have questions after your visit or need to cancel or reschedule your appointment, please contact [facility]  Dept: [phone]  and follow the prompts.  Office hours are 8:00 a.m. to 4:30 p.m. Monday - Friday. Please note that voicemails left after 4:00 p.m. may not be returned until the following business day.  We are closed weekends and major holidays. You have access to a nurse at all times for urgent questions. Please call the main number to the clinic Dept: [phone] and follow the prompts.   For any non-urgent questions, you may also contact your provider using MyChart. We now offer e-Visits for anyone 69 and older to request care online for non-urgent symptoms. For details visit [email or web address].   Also download the MyChart app! Go to the app store, search "MyChart", open the app, select [facility], and log in with your MyChart username and password.  Due to Covid, a mask is required upon entering the hospital/clinic. If you do not have a mask, one will be given to you upon arrival. For doctor visits, patients may have 1 support person aged 69 or older with them. For treatment visits, patients cannot have anyone with them due to current Covid guidelines and our immunocompromised population.  Nivolumab injection What is this medication? NIVOLUMAB (nye VOL ue mab) is a monoclonal antibody. It treats certain types of cancer. Some of the cancers treated are colon cancer, head and neck cancer, Hodgkin lymphoma, lung cancer, and melanoma. This medicine may be used for other purposes; ask your health care provider  or pharmacist if you have questions. COMMON BRAND NAME(S): Opdivo What should I tell my care team before I take this medication? They need to know if you have any of these conditions: Autoimmune diseases such as Crohn's disease, ulcerative colitis, or lupus Have had or planning to  have an allogeneic stem cell transplant (uses someone else's stem cells) History of chest radiation Organ transplant Nervous system problems such as myasthenia gravis or Guillain-Barre syndrome An unusual or allergic reaction to nivolumab, other medicines, foods, dyes, or preservatives Pregnant or trying to get pregnant Breast-feeding How should I use this medication? This medication is injected into a vein. It is given in a hospital or clinic setting. A special MedGuide will be given to you before each treatment. Be sure to read this information carefully each time. Talk to your care team regarding the use of this medication in children. While it may be prescribed for children as young as 12 years for selected conditions, precautions do apply. Overdosage: If you think you have taken too much of this medicine contact a poison control center or emergency room at once. NOTE: This medicine is only for you. Do not share this medicine with others. What if I miss a dose? Keep appointments for follow-up doses. It is important not to miss your dose. Call your care team if you are unable to keep an appointment. What may interact with this medication? Interactions have not been studied. This list may not describe all possible interactions. Give your health care provider a list of all the medicines, herbs, non-prescription drugs, or dietary supplements you use. Also tell them if you smoke, drink alcohol, or use illegal drugs. Some items may interact with your medicine. What should I watch for while using this medication? Your condition will be monitored carefully while you are receiving this medication. You may need blood work done while you are taking this medication. Do not become pregnant while taking this medication or for 5 months after stopping it. Women should inform their care team if they wish to become pregnant or think they might be pregnant. There is a potential for serious harm to an unborn  child. Talk to your care team for more information. Do not breast-feed an infant while taking this medication or for 5 months after stopping it. What side effects may I notice from receiving this medication? Side effects that you should report to your care team as soon as possible: Allergic reactions--skin rash, itching, hives, swelling of the face, lips, tongue, or throat Bloody or black, tar-like stools Change in vision Chest pain Diarrhea Dry cough, shortness of breath or trouble breathing Eye pain Fast or irregular heartbeat Fever, chills High blood sugar (hyperglycemia)--increased thirst or amount of urine, unusual weakness or fatigue, blurry vision High thyroid levels (hyperthyroidism)--fast or irregular heartbeat, weight loss, excessive sweating or sensitivity to heat, tremors or shaking, anxiety, nervousness, irregular menstrual cycle or spotting Kidney injury--decrease in the amount of urine, swelling of the ankles, hands, or feet Liver injury--right upper belly pain, loss of appetite, nausea, light-colored stool, dark yellow or brown urine, yellowing skin or eyes, unusual weakness or fatigue Low red blood cell count--unusual weakness or fatigue, dizziness, headache, trouble breathing Low thyroid levels (hypothyroidism)--unusual weakness or fatigue, increased sensitivity to cold, constipation, hair loss, dry skin, weight gain, feelings of depression Mood and behavior changes-confusion, change in sex drive or performance, irritability Muscle pain or cramps Pain, tingling, or numbness in the hands or feet, muscle weakness, trouble walking, loss of  balance or coordination Red or dark brown urine Redness, blistering, peeling, or loosening of the skin, including inside the mouth Stomach pain Unusual bruising or bleeding Side effects that usually do not require medical attention (report to your care team if they continue or are bothersome): Bone pain Constipation Loss of  appetite Nausea Tiredness Vomiting This list may not describe all possible side effects. Call your doctor for medical advice about side effects. You may report side effects to FDA at 1-800-FDA-1088. Where should I keep my medication? This medication is given in a hospital or clinic and will not be stored at home. NOTE: This sheet is a summary. It may not cover all possible information. If you have questions about this medicine, talk to your doctor, pharmacist, or health care provider.

## 2021-02-17 NOTE — Progress Notes (Deleted)
Cardiology Office Note:    Date:  02/17/2021   ID:  Randy Wood, DOB [birth year], MRN [id number]  PCP:  [name], Raymond J, MD   [place]  Cardiologist:  None  Advanced Practice Provider:  No care team member to display Electrophysiologist:  None    Referring MD: Eunice Blase, MD    History of Present Illness:    Randy Wood is a 80 y.o. male with a hx of HTN, HLD, alcohol abuse and alcoholic cirrhosis who returns to clinic for follow-up of dyspnea and LE edema.  Patient admitted to [facility] from 05/03/20-05/11/20 for worsening SOB, anasarca and leg pain/wekness found to have decompensated liver disease with significant ascites and renal failure. During admission, patient underwent paracentesis with 5.2L of fluid removed. Peritoneal fluid with ESBL E.Coli for which he was started on meropenem. Was diuresed with IV lasix later transitioned to PO. Renal function improved with fluid removal and diuresis. TTE with normal LVEF, G1DD. LE doppler negative for LE DVT.  Last saw me in clinic on 05/24/20 where he was feeling much better with improved LE edema. Blood pressure controlled and patient was compliant with low Na diet. No anginal symptoms. No changes to meds made at that time.  Today,***   Past Medical History:  Diagnosis Date   AKI (acute kidney injury) ([place])    Alcohol abuse    Arthritis    Cataract 2006   Diarrhea    Edema leg    Esophageal cancer ([place])    Family history of breast cancer 12/09/2020   Family history of ovarian cancer 12/09/2020   Family history of prostate cancer 12/09/2020   GERD (gastroesophageal reflux disease)    Glaucoma unknown   Hyperbilirubinemia    Hyperlipemia    Hypertension    Hypokalemia    Liver failure (HCC)    Macular degeneration, wet (HCC)    Microcytic anemia    Osteoarthritis    SOB (shortness of breath)    Weight gain     Past Surgical History:  Procedure Laterality Date   ANKLE SURGERY      BIOPSY  11/11/2020   Procedure: BIOPSY;  Surgeon: [name], MD;  Location: [facility];  Service: Gastroenterology;;   CATARACT EXTRACTION, BILATERAL     ESOPHAGOGASTRODUODENOSCOPY (EGD) WITH PROPOFOL N/A 11/11/2020   Procedure: ESOPHAGOGASTRODUODENOSCOPY (EGD) WITH PROPOFOL;  Surgeon: [name], MD;  Location: [facility];  Service: Gastroenterology;  Laterality: N/A;   EYE SURGERY  2006   FOREIGN BODY REMOVAL  11/11/2020   Procedure: FOREIGN BODY REMOVAL;  Surgeon: [name], MD;  Location: [facility];  Service: Gastroenterology;;   IR PARACENTESIS  05/09/2020   TONSILLECTOMY      Current Medications: No outpatient medications have been marked as taking for the 02/27/21 encounter (Appointment) with Freada Bergeron, MD.     Allergies:   Lisinopril   Social History   Socioeconomic History   Marital status: Widowed    Spouse name: Not on file   Number of children: 2   Years of education: Not on file   Highest education level: Bachelor's degree (e.g., BA, AB, BS)  Occupational History   Occupation: retired  Tobacco Use   Smoking status: Former    Packs/day: 2.00    Years: 15.00    Pack years: 30.00    Types: Cigarettes    Quit date: 03/30/1972    Years since quitting: 48.9   Smokeless tobacco: Never  Vaping Use  Vaping Use: Never used  Substance and Sexual Activity   Alcohol use: Not Currently    Comment: 2-3 drinks/night, none since 04/2020   Drug use: Never   Sexual activity: Not Currently    Birth control/protection: None  Other Topics Concern   Not on file  Social History Narrative   Widowed, wife died GBM 06/19/15 treated by Dr. Alvy Bimler and Dr. Tammi Klippel    2 daughters, [name] (lives in [place], commutes to [place], primary health contact for pt) and [name]   Right handed   Retired   Drinks coffee 1 cup a day, NO Tea NO soda   Social Determinants of Radio broadcast assistant Strain: Not on file  Food Insecurity: Not on file  Transportation  Needs: Not on file  Physical Activity: Not on file  Stress: Not on file  Social Connections: Not on file     Family History: The patient's family history includes Breast cancer (age of onset: 57) in his daughter; Cancer in his daughter, father, and mother; Colon cancer in his paternal grandmother; Hashimoto's thyroiditis in his daughter; Healthy in his brother; Ovarian cancer (age of onset: 81) in his mother; Prostate cancer in his father; Stomach cancer in his cousin.  ROS:   Please see the history of present illness.    Review of Systems  Constitutional:  Negative for chills and fever.  HENT:  Negative for hearing loss.   Eyes:  Negative for blurred vision and redness.  Respiratory:  Negative for shortness of breath.   Cardiovascular:  Positive for leg swelling. Negative for chest pain, palpitations, orthopnea, claudication and PND.  Gastrointestinal:  Negative for abdominal pain, melena, nausea and vomiting.  Genitourinary:  Negative for dysuria and hematuria.  Musculoskeletal:  Negative for falls.  Neurological:  Negative for dizziness and loss of consciousness.  Endo/Heme/Allergies:  Negative for polydipsia.  Psychiatric/Behavioral:  Positive for substance abuse.    EKGs/Labs/Other Studies Reviewed:    The following studies were reviewed today: TTE May 16, 2020:  1. Left ventricular ejection fraction, by estimation, is 60 to 65%. The  left ventricle has normal function. The left ventricle has no regional  wall motion abnormalities. Left ventricular diastolic parameters are  consistent with Grade I diastolic  dysfunction (impaired relaxation).   2. Right ventricular systolic function is normal. The right ventricular  size is normal. Tricuspid regurgitation signal is inadequate for assessing  PA pressure.   3. The mitral valve is grossly normal. Trivial mitral valve  regurgitation.   4. The aortic valve is tricuspid. Aortic valve regurgitation is not  visualized. Mild aortic  valve sclerosis is present, with no evidence of  aortic valve stenosis.   5. The inferior vena cava is normal in size with greater than 50%  respiratory variability, suggesting right atrial pressure of 3 mmHg.   LE Doppler ultrasound 05/02/20: Summary:  RIGHT:  - There is no evidence of deep vein thrombosis in the lower extremity.  - There is no evidence of superficial venous thrombosis.     - No cystic structure found in the popliteal fossa.     LEFT:  - There is no evidence of deep vein thrombosis in the lower extremity.  - There is no evidence of superficial venous thrombosis.   Recent Labs: 05/03/2020: B Natriuretic Peptide 237.7 05/05/2020: Magnesium 1.6 02/03/2021: ALT 13; BUN 17; Creatinine 0.90; Hemoglobin 11.2; Platelet Count 150; Potassium 3.9; Sodium 137; TSH 1.434  Recent Lipid Panel    Component Value Date/Time   CHOL  175 05/28/2020 0754   TRIG 105 05/28/2020 0754   HDL 27 (L) 05/28/2020 0754   CHOLHDL 6.5 (H) 05/28/2020 0754   LDLCALC 129 (H) 05/28/2020 0754     Physical Exam:    VS:  There were no vitals taken for this visit.    Wt Readings from Last 3 Encounters:  02/12/21 162 lb (73.5 kg)  02/03/21 161 lb 11.2 oz (73.3 kg)  01/22/21 158 lb 4.8 oz (71.8 kg)     GEN:  Well nourished, well developed in no acute distress HEENT: Normal NECK: No JVD; No carotid bruits CARDIAC: RRR, no murmurs, rubs, gallops RESPIRATORY:  Clear to auscultation without rales, wheezing or rhonchi  ABDOMEN: Soft, non-tender, non-distended MUSCULOSKELETAL:  Trace edema; No deformity  SKIN: Warm and dry NEUROLOGIC:  Alert and oriented x 3 PSYCHIATRIC:  Normal affect   ASSESSMENT:    No diagnosis found.  PLAN:    In order of problems listed above:  #Alcoholic Cirrhosis: #DOE #LE Edema: Patient with hospitalization in 04/2020 for anasarca, LE edema and dyspnea found to have decompensated liver cirrhosis secondary to alcohol. Was successfully diuresed with IV lasix and  underwent paracentesis with significant improvement. TTE with normal EF, G1DD, no significant valvular disease. Suspect symptoms were driven by alcoholic cirrhosis which have now significantly improved.  -Continue management of cirrhosis per hepatology -Continue spironolactone 50mg  daily -Off lasix due to low K; maintaining good fluid status off lasix -LE dopplers negative; TTE with normal BiV function; no significant valvular disease -Continue low Na diet -Monitoring daily weights -Continue alcohol cessation  #HTN: Well controlled on home BP cuff. Not on medications currently. 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  -He received palliative radiation therapy to primary tumor 9/15-9/30/22 -he began single agent Xeloda (oral), and nivolumab (IV) every 2 weeks on 01/07/21. He is tolerating well  -labs reviewed, adequate for treatment    Medication Adjustments/Labs and Tests Ordered: Current medicines are reviewed at length with the patient today.  Concerns regarding medicines are outlined above.  No orders of the defined types were placed in this encounter.  No orders of the defined types were placed in this encounter.   There are no Patient Instructions on file for this visit.    Signed, Freada Bergeron, MD  02/17/2021 5:40 AM    [facility]

## 2021-02-17 NOTE — Progress Notes (Signed)
Nutrition follow-up completed with patient during infusion for esophageal cancer.  CURRENT THERAPY: Xeloda 1500 mg q12h 1 week on/1 week off, starting 01/06/21 and Nivolumab q2 weeks starting 01/07/21  Weight documented as 159.19 pounds November 21, stable from 158 pounds October 10. Noted labs: Glucose 104 and albumin 3.3.  Patient reports he is feeling much better and is eating well. He denies difficulty swallowing at this time.  Reports eating chicken Parmesan as well as chicken salad croissants and [place] fries.  He currently denies nausea and vomiting.  Reports he has not had to drink as much Ensure secondary to increased oral intake.  Reports improvement in bowel regimen.  He has no questions or concerns currently.  Nutrition diagnosis: Inadequate oral intake improved.  Intervention: Encourage patient to continue same strategies for increased calories and protein. Recommended patient try to continue at least 1 oral nutrition supplement daily.  The generic brand is acceptable. Continue bowel regimen. Encouraged continued weight maintenance.  Monitoring, evaluation, goals: Patient will tolerate adequate calories and protein for minimal weight loss.  Next visit: Monday, December 19 during infusion.  **Disclaimer: This note was dictated with voice recognition software. Similar sounding words can inadvertently be transcribed and this note may contain transcription errors which may not have been corrected upon publication of note.**

## 2021-02-18 LAB — T4: T4, Total: 11.3 ug/dL (ref 4.5–12.0)

## 2021-02-24 ENCOUNTER — Encounter (INDEPENDENT_AMBULATORY_CARE_PROVIDER_SITE_OTHER): Payer: Medicare Other | Admitting: Ophthalmology

## 2021-02-24 ENCOUNTER — Other Ambulatory Visit: Payer: Self-pay

## 2021-02-24 DIAGNOSIS — H43813 Vitreous degeneration, bilateral: Secondary | ICD-10-CM | POA: Diagnosis not present

## 2021-02-24 DIAGNOSIS — H353122 Nonexudative age-related macular degeneration, left eye, intermediate dry stage: Secondary | ICD-10-CM

## 2021-02-24 DIAGNOSIS — H35033 Hypertensive retinopathy, bilateral: Secondary | ICD-10-CM

## 2021-02-24 DIAGNOSIS — H353211 Exudative age-related macular degeneration, right eye, with active choroidal neovascularization: Secondary | ICD-10-CM

## 2021-02-24 DIAGNOSIS — I10 Essential (primary) hypertension: Secondary | ICD-10-CM | POA: Diagnosis not present

## 2021-02-24 NOTE — Assessment & Plan Note (Signed)
Reports intermittent issues with allergic rhinitis/sinuses in the past.  Has been having some issues recently Has used some medication in the past including Zyrtec as well as Flonase Recommend that he utilize these on a regular basis for the next few weeks to see if this helps with symptoms, could also consider use of intranasal saline spray to help with symptoms

## 2021-02-25 ENCOUNTER — Other Ambulatory Visit: Payer: Self-pay | Admitting: Hematology

## 2021-02-25 DIAGNOSIS — Z7952 Long term (current) use of systemic steroids: Secondary | ICD-10-CM | POA: Diagnosis not present

## 2021-02-25 DIAGNOSIS — Z961 Presence of intraocular lens: Secondary | ICD-10-CM | POA: Diagnosis not present

## 2021-02-25 DIAGNOSIS — Z79899 Other long term (current) drug therapy: Secondary | ICD-10-CM | POA: Diagnosis not present

## 2021-02-25 DIAGNOSIS — H401123 Primary open-angle glaucoma, left eye, severe stage: Secondary | ICD-10-CM | POA: Diagnosis not present

## 2021-02-25 DIAGNOSIS — E7800 Pure hypercholesterolemia, unspecified: Secondary | ICD-10-CM | POA: Diagnosis not present

## 2021-02-25 DIAGNOSIS — I10 Essential (primary) hypertension: Secondary | ICD-10-CM | POA: Diagnosis not present

## 2021-02-25 DIAGNOSIS — K219 Gastro-esophageal reflux disease without esophagitis: Secondary | ICD-10-CM | POA: Diagnosis not present

## 2021-02-25 DIAGNOSIS — Z888 Allergy status to other drugs, medicaments and biological substances status: Secondary | ICD-10-CM | POA: Diagnosis not present

## 2021-02-25 MED ORDER — CAPECITABINE 500 MG PO TABS: ORAL_TABLET | ORAL | 2 refills | Status: DC

## 2021-02-27 ENCOUNTER — Encounter: Payer: Self-pay | Admitting: Cardiology

## 2021-02-27 ENCOUNTER — Other Ambulatory Visit: Payer: Self-pay

## 2021-02-27 ENCOUNTER — Ambulatory Visit (INDEPENDENT_AMBULATORY_CARE_PROVIDER_SITE_OTHER): Payer: Medicare Other | Admitting: Cardiology

## 2021-02-27 VITALS — BP 124/60 | HR 82 | Ht 71.0 in | Wt 158.2 lb

## 2021-02-27 DIAGNOSIS — R600 Localized edema: Secondary | ICD-10-CM

## 2021-02-27 DIAGNOSIS — Z9221 Personal history of antineoplastic chemotherapy: Secondary | ICD-10-CM | POA: Diagnosis not present

## 2021-02-27 DIAGNOSIS — K7031 Alcoholic cirrhosis of liver with ascites: Secondary | ICD-10-CM

## 2021-02-27 DIAGNOSIS — C159 Malignant neoplasm of esophagus, unspecified: Secondary | ICD-10-CM

## 2021-02-27 DIAGNOSIS — I10 Essential (primary) hypertension: Secondary | ICD-10-CM

## 2021-02-27 DIAGNOSIS — R0609 Other forms of dyspnea: Secondary | ICD-10-CM | POA: Diagnosis not present

## 2021-02-27 DIAGNOSIS — E7800 Pure hypercholesterolemia, unspecified: Secondary | ICD-10-CM

## 2021-02-27 NOTE — Progress Notes (Addendum)
Cardiology Office Note:    Date:  02/27/2021   ID:  Randy Wood, DOB [birth year], MRN [id number]  PCP:  [name], Raymond J, MD   [place]  Cardiologist:  None  Advanced Practice Provider:  No care team member to display Electrophysiologist:  None    Referring MD: Eunice Blase, MD    History of Present Illness:    Randy Wood is a 80 y.o. male with a hx of HTN, HLD, alcohol abuse and alcoholic cirrhosis who returns to clinic for follow-up of dyspnea and LE edema.  Patient admitted to [facility] from 05/03/20-05/11/20 for worsening SOB, anasarca and leg pain/wekness found to have decompensated liver disease with significant ascites and renal failure. During admission, patient underwent paracentesis with 5.2L of fluid removed. Peritoneal fluid with ESBL E.Coli for which he was started on meropenem. Was diuresed with IV lasix later transitioned to PO. Renal function improved with fluid removal and diuresis. TTE with normal LVEF, G1DD. LE doppler negative for LE DVT.  Last saw me in clinic on 05/24/20 where he was feeling much better with improved LE edema. Blood pressure controlled and patient was compliant with low Na diet. No anginal symptoms. No changes to meds made at that time.  Today, he states that he has been going through a lot lately. Since his last visit, he developed progressive difficulties with swallowing and suffered from profound weight loss (70lbs). He was subsequently diagnosed with esophageal adenocarcinoma and is currently undergoing chemo (xeloda), XRT, and immunologic therapy with Dr. Burr Medico.   Otherwise, he denies any palpitations, chest pain, shortness of breath, lightheadedness, headaches, syncope, orthopnea, or lower extremity edema. He is still taking spironolactone, but his lasix was stopped due to hypoK. Since his weight loss, his blood pressure has also come down significantly remaining mainly in the low 100s. No hypotension or  lightheadedness, syncope.   Past Medical History:  Diagnosis Date   AKI (acute kidney injury) ([place])    Alcohol abuse    Arthritis    Cataract 2006   Diarrhea    Edema leg    Esophageal cancer ([place])    Family history of breast cancer 12/09/2020   Family history of ovarian cancer 12/09/2020   Family history of prostate cancer 12/09/2020   GERD (gastroesophageal reflux disease)    Glaucoma unknown   Hyperbilirubinemia    Hyperlipemia    Hypertension    Hypokalemia    Liver failure (HCC)    Macular degeneration, wet (HCC)    Microcytic anemia    Osteoarthritis    SOB (shortness of breath)    Weight gain     Past Surgical History:  Procedure Laterality Date   ANKLE SURGERY     BIOPSY  11/11/2020   Procedure: BIOPSY;  Surgeon: [name], MD;  Location: [facility]OS[facility];  Service: Gastroenterology;;   CATARACT EXTRACTION, BILATERAL     ESOPHAGOGASTRODUODENOSCOPY (EGD) WITH PROPOFOL N/A 11/11/2020   Procedure: ESOPHAGOGASTRODUODENOSCOPY (EGD) WITH PROPOFOL;  Surgeon: [name], MD;  Location: [facility];  Service: Gastroenterology;  Laterality: N/A;   EYE SURGERY  2006   FOREIGN BODY REMOVAL  11/11/2020   Procedure: FOREIGN BODY REMOVAL;  Surgeon: [name], MD;  Location: [facility];  Service: Gastroenterology;;   IR PARACENTESIS  05/09/2020   TONSILLECTOMY      Current Medications: Current Meds  Medication Sig   capecitabine (XELODA) 500 MG tablet Take 3 tabs in the morning and evening, 30 minutes after meal,  for  7 days on and 7 days off   cetirizine (ZYRTEC) 10 MG tablet Take 10 mg by mouth daily.   EPINEPHrine 0.3 mg/0.3 mL IJ SOAJ injection Inject 0.3 mg into the muscle as needed for anaphylaxis.   ezetimibe (ZETIA) 10 MG tablet Take 1 tablet (10 mg total) by mouth daily.   fluticasone (FLONASE) 50 MCG/ACT nasal spray Place 1 spray into both nostrils daily as needed for allergies.   GLUCOSAMINE-CHONDROITIN PO Take 1 capsule by mouth daily.   lactulose  (CHRONULAC) 10 GM/15ML solution Take 15 mLs (10 g total) by mouth daily as needed for mild constipation.   moxifloxacin (VIGAMOX) 0.5 % ophthalmic solution Place one drop into the left eye 4 (four) times daily. Do not take tonight.  Bring to office tomorrow.   Multiple Vitamins-Minerals (ONE-A-DAY MENS 50+) TABS Take 1 tablet by mouth daily.   Multiple Vitamins-Minerals (PRESERVISION AREDS 2 PO) Take 1 tablet by mouth 2 (two) times daily.   Netarsudil Dimesylate (RHOPRESSA) 0.02 % SOLN Place 1 drop into the left eye daily.   spironolactone (ALDACTONE) 50 MG tablet Take 1 tablet (50 mg total) by mouth daily.   sucralfate (CARAFATE) 1 g tablet Take 1 tablet (1 g total) by mouth 4 (four) times daily. Dissolve each tablet in 15 cc water before use.     Allergies:   Lisinopril   Social History   Socioeconomic History   Marital status: Widowed    Spouse name: Not on file   Number of children: 2   Years of education: Not on file   Highest education level: Bachelor's degree (e.g., BA, AB, BS)  Occupational History   Occupation: retired  Tobacco Use   Smoking status: Former    Packs/day: 2.00    Years: 15.00    Pack years: 30.00    Types: Cigarettes    Quit date: 03/30/1972    Years since quitting: 48.9   Smokeless tobacco: Never  Vaping Use   Vaping Use: Never used  Substance and Sexual Activity   Alcohol use: Not Currently    Comment: 2-3 drinks/night, none since 04/2020   Drug use: Never   Sexual activity: Not Currently    Birth control/protection: None  Other Topics Concern   Not on file  Social History Narrative   Widowed, wife died GBM 06/22/2015 treated by Dr. Alvy Bimler and Dr. Tammi Klippel    2 daughters, [name] (lives in [place], commutes to [place], primary health contact for pt) and [name]   Right handed   Retired   Drinks coffee 1 cup a day, NO Tea NO soda   Social Determinants of Radio broadcast assistant Strain: Not on file  Food Insecurity: Not on file  Transportation  Needs: Not on file  Physical Activity: Not on file  Stress: Not on file  Social Connections: Not on file     Family History: The patient's family history includes Breast cancer (age of onset: 48) in his daughter; Cancer in his daughter, father, and mother; Colon cancer in his paternal grandmother; Hashimoto's thyroiditis in his daughter; Healthy in his brother; Ovarian cancer (age of onset: 45) in his mother; Prostate cancer in his father; Stomach cancer in his cousin.  ROS:   Please see the history of present illness.    Review of Systems  Constitutional:  Positive for weight loss. Negative for chills and fever.  HENT:  Negative for hearing loss.   Eyes:  Negative for blurred vision and redness.  Respiratory:  Negative for  shortness of breath.   Cardiovascular:  Negative for chest pain, palpitations, orthopnea, claudication, leg swelling and PND.  Gastrointestinal:  Negative for abdominal pain, melena, nausea and vomiting.  Genitourinary:  Negative for dysuria and hematuria.  Musculoskeletal:  Positive for falls.  Skin:  Negative for rash.  Neurological:  Negative for dizziness and loss of consciousness.  Endo/Heme/Allergies:  Does not bruise/bleed easily.  Psychiatric/Behavioral:  Negative for memory loss and substance abuse.    EKGs/Labs/Other Studies Reviewed:    The following studies were reviewed today: TTE 2020/05/13:  1. Left ventricular ejection fraction, by estimation, is 60 to 65%. The  left ventricle has normal function. The left ventricle has no regional  wall motion abnormalities. Left ventricular diastolic parameters are  consistent with Grade I diastolic  dysfunction (impaired relaxation).   2. Right ventricular systolic function is normal. The right ventricular  size is normal. Tricuspid regurgitation signal is inadequate for assessing  PA pressure.   3. The mitral valve is grossly normal. Trivial mitral valve  regurgitation.   4. The aortic valve is tricuspid.  Aortic valve regurgitation is not  visualized. Mild aortic valve sclerosis is present, with no evidence of  aortic valve stenosis.   5. The inferior vena cava is normal in size with greater than 50%  respiratory variability, suggesting right atrial pressure of 3 mmHg.   LE Doppler ultrasound 05/02/20: Summary:  RIGHT:  - There is no evidence of deep vein thrombosis in the lower extremity.  - There is no evidence of superficial venous thrombosis.    - No cystic structure found in the popliteal fossa.  LEFT:  - There is no evidence of deep vein thrombosis in the lower extremity.  - There is no evidence of superficial venous thrombosis.  EKG: EKG was not ordered today   Recent Labs: 05/03/2020: B Natriuretic Peptide 237.7 05/05/2020: Magnesium 1.6 02/17/2021: ALT 13; BUN 13; Creatinine 0.87; Hemoglobin 11.8; Platelet Count 156; Potassium 3.8; Sodium 140; TSH 1.818  Recent Lipid Panel    Component Value Date/Time   CHOL 175 05/28/2020 0754   TRIG 105 05/28/2020 0754   HDL 27 (L) 05/28/2020 0754   CHOLHDL 6.5 (H) 05/28/2020 0754   LDLCALC 129 (H) 05/28/2020 0754     Physical Exam:    VS:  BP 124/60 (BP Location: Right Arm, Patient Position: Sitting, Cuff Size: Normal)   Pulse 82   Ht 5\' 11"  (1.803 m)   Wt 158 lb 3.2 oz (71.8 kg)   SpO2 96%   BMI 22.06 kg/m     Wt Readings from Last 3 Encounters:  02/27/21 158 lb 3.2 oz (71.8 kg)  02/17/21 159 lb 3 oz (72.2 kg)  02/12/21 162 lb (73.5 kg)     GEN:  Well nourished, well developed in no acute distress HEENT: Normal NECK: No JVD; No carotid bruits CARDIAC: RRR, no murmurs, rubs, gallops RESPIRATORY:  Clear to auscultation without rales, wheezing or rhonchi  ABDOMEN: Soft, non-tender, non-distended MUSCULOSKELETAL:  No edema; No deformity  SKIN: Warm and dry NEUROLOGIC:  Alert and oriented x 3 PSYCHIATRIC:  Normal affect   ASSESSMENT:    1. Dyspnea on exertion   2. Hypercholesterolemia   3. History of chemotherapy    4. Essential hypertension   5. Alcoholic cirrhosis of liver with ascites ([place])   6. Lower extremity edema   7. Esophageal adenocarcinoma ([place])     PLAN:    In order of problems listed above:  #Alcoholic Cirrhosis: #DOE #LE Edema:  Patient with hospitalization in 04/2020 for anasarca, LE edema and dyspnea found to have decompensated liver cirrhosis secondary to alcohol. Was successfully diuresed with IV lasix and underwent paracentesis with significant improvement. TTE with normal EF, G1DD, no significant valvular disease. Suspect symptoms were driven by alcoholic cirrhosis which have now significantly improved. Appears euvolemic today. -Continue management of cirrhosis per hepatology -Continue spironolactone 50mg  daily -Off lasix due to low K; maintaining good fluid status off lasix -LE dopplers negative; TTE with normal BiV function; no significant valvular disease -Continue low Na diet -Monitoring daily weights -Continue alcohol cessation   #HTN: On the low side since 70lbs weight loss in the setting of esophageal adenocarcinoma. Now just on spironolactone. -Continue spironolactone 50mg  daily -Monitor for hypotension at home and adjust as needed    #HLD: LDL 129 in 05/2020 prior to 70lbs weight loss. 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 Had 70lbs unintentional weight loss. Followed by Dr. Burr Medico. -He received palliative radiation therapy to primary tumor 9/15-9/30/22 -On Xeloda (oral), and nivolumab (IV) every 2 weeks  -Will check limited TTE with strain for baseline and monitoring throughout cancer treatment    Medication Adjustments/Labs and Tests Ordered: Current medicines are reviewed at length with the patient today.   Concerns regarding medicines are outlined above.  Orders Placed This Encounter  Procedures   ECHOCARDIOGRAM LIMITED    No orders of the defined types were placed in this encounter.   Patient Instructions  Medication Instructions:   Your physician recommends that you continue on your current medications as directed. Please refer to the Current Medication list given to you today.  *If you need a refill on your cardiac medications before your next appointment, please call your pharmacy*   Lab Work:  NEXT TIME [place] TO SEE YOUR ONCOLOGIST, HAVE THEM CHECK A LIPID PANEL ON YOU PER DR. Johney Frame  If you have labs (blood work) drawn today and your tests are completely normal, you will receive your results only by: [place] (if you have MyChart) OR A paper copy in the mail If you have any lab test that is abnormal or we need to change your treatment, we will call you to review the results.   Testing/Procedures:  Your physician has requested that you have an LIMITED echocardiogram WITH STRAIN . Echocardiography is a painless test that uses sound waves to create images of your heart. It provides your doctor with information about the size and shape of your heart and how well your heart's chambers and valves are working. This procedure takes approximately one hour. There are no restrictions for this procedure.  DO LIMITED ECHO WITH STRAIN PER DR. Johney Frame    Follow-Up: At [facility], you and your health needs are our priority.  As part of our continuing mission to provide you with exceptional heart care, we have created designated Provider Care Teams.  These Care Teams include your primary Cardiologist (physician) and Advanced Practice Providers (APPs -  Physician Assistants and Nurse Practitioners) who all work together to provide you with the care you need, when you need it.  We recommend signing up for the patient portal called "MyChart".  Sign up information is provided  on this After Visit Summary.  MyChart is used to connect with  patients for Virtual Visits (Telemedicine).  Patients are able to view lab/test results, encounter notes, upcoming appointments, etc.  Non-urgent messages can be sent to your provider as well.   To learn more about what you can do with MyChart, go to [email or web address].    Your next appointment:   6 month(s)  The format for your next appointment:   In Person  Provider:   DR. Johney Frame   If MD is not listed, click here to update    :1}       I,Mykaella Javier,acting as a scribe for Freada Bergeron, MD.,have documented all relevant documentation on the behalf of Freada Bergeron, MD,as directed by  Freada Bergeron, MD while in the presence of Freada Bergeron, MD.  I, Freada Bergeron, MD, have reviewed all documentation for this visit. The documentation on 02/27/21 for the exam, diagnosis, procedures, and orders are all accurate and complete.   Signed, Freada Bergeron, MD  02/27/2021 10:07 AM    [place]

## 2021-02-27 NOTE — Patient Instructions (Signed)
Medication Instructions:   Your physician recommends that you continue on your current medications as directed. Please refer to the Current Medication list given to you today.  *If you need a refill on your cardiac medications before your next appointment, please call your pharmacy*   Lab Work:  NEXT TIME [facility] TO SEE YOUR ONCOLOGIST, HAVE THEM CHECK A LIPID PANEL ON YOU PER DR. Johney Frame  If you have labs (blood work) drawn today and your tests are completely normal, you will receive your results only by: [place] (if you have MyChart) OR A paper copy in the mail If you have any lab test that is abnormal or we need to change your treatment, we will call you to review the results.   Testing/Procedures:  Your physician has requested that you have an LIMITED echocardiogram WITH STRAIN . Echocardiography is a painless test that uses sound waves to create images of your heart. It provides your doctor with information about the size and shape of your heart and how well your heart's chambers and valves are working. This procedure takes approximately one hour. There are no restrictions for this procedure.  DO LIMITED ECHO WITH STRAIN PER DR. Johney Frame    Follow-Up: At [facility], you and your health needs are our priority.  As part of our continuing mission to provide you with exceptional heart care, we have created designated Provider Care Teams.  These Care Teams include your primary Cardiologist (physician) and Advanced Practice Providers (APPs -  Physician Assistants and Nurse Practitioners) who all work together to provide you with the care you need, when you need it.  We recommend signing up for the patient portal called "MyChart".  Sign up information is provided on this After Visit Summary.  MyChart is used to connect with patients for Virtual Visits (Telemedicine).  Patients are able to view lab/test results, encounter notes, upcoming appointments, etc.  Non-urgent messages  can be sent to your provider as well.   To learn more about what you can do with MyChart, go to [email or web address].    Your next appointment:   6 month(s)  The format for your next appointment:   In Person  Provider:   DR. Johney Frame   If MD is not listed, click here to update    :1}

## 2021-03-03 ENCOUNTER — Encounter: Payer: Self-pay | Admitting: Hematology

## 2021-03-03 ENCOUNTER — Inpatient Hospital Stay: Payer: Medicare Other

## 2021-03-03 ENCOUNTER — Inpatient Hospital Stay (HOSPITAL_BASED_OUTPATIENT_CLINIC_OR_DEPARTMENT_OTHER): Payer: Medicare Other | Admitting: Hematology

## 2021-03-03 ENCOUNTER — Other Ambulatory Visit: Payer: Self-pay

## 2021-03-03 ENCOUNTER — Inpatient Hospital Stay: Payer: Medicare Other | Attending: Nurse Practitioner

## 2021-03-03 VITALS — BP 138/60 | HR 71 | Temp 98.1°F | Resp 17 | Ht 71.0 in | Wt 164.2 lb

## 2021-03-03 DIAGNOSIS — Z923 Personal history of irradiation: Secondary | ICD-10-CM | POA: Diagnosis not present

## 2021-03-03 DIAGNOSIS — C7951 Secondary malignant neoplasm of bone: Secondary | ICD-10-CM | POA: Diagnosis not present

## 2021-03-03 DIAGNOSIS — I10 Essential (primary) hypertension: Secondary | ICD-10-CM

## 2021-03-03 DIAGNOSIS — Z5111 Encounter for antineoplastic chemotherapy: Secondary | ICD-10-CM | POA: Diagnosis not present

## 2021-03-03 DIAGNOSIS — Z79899 Other long term (current) drug therapy: Secondary | ICD-10-CM | POA: Insufficient documentation

## 2021-03-03 DIAGNOSIS — C787 Secondary malignant neoplasm of liver and intrahepatic bile duct: Secondary | ICD-10-CM | POA: Insufficient documentation

## 2021-03-03 DIAGNOSIS — C155 Malignant neoplasm of lower third of esophagus: Secondary | ICD-10-CM | POA: Diagnosis not present

## 2021-03-03 LAB — CBC WITH DIFFERENTIAL (CANCER CENTER ONLY)
Abs Immature Granulocytes: 0.01 10*3/uL (ref 0.00–0.07)
Basophils Absolute: 0 10*3/uL (ref 0.0–0.1)
Basophils Relative: 0 %
Eosinophils Absolute: 0.1 10*3/uL (ref 0.0–0.5)
Eosinophils Relative: 1 %
HCT: 35.9 % — ABNORMAL LOW (ref 39.0–52.0)
Hemoglobin: 11.8 g/dL — ABNORMAL LOW (ref 13.0–17.0)
Immature Granulocytes: 0 %
Lymphocytes Relative: 23 %
Lymphs Abs: 1.1 10*3/uL (ref 0.7–4.0)
MCH: 33 pg (ref 26.0–34.0)
MCHC: 32.9 g/dL (ref 30.0–36.0)
MCV: 100.3 fL — ABNORMAL HIGH (ref 80.0–100.0)
Monocytes Absolute: 0.4 10*3/uL (ref 0.1–1.0)
Monocytes Relative: 9 %
Neutro Abs: 3 10*3/uL (ref 1.7–7.7)
Neutrophils Relative %: 67 %
Platelet Count: 132 10*3/uL — ABNORMAL LOW (ref 150–400)
RBC: 3.58 MIL/uL — ABNORMAL LOW (ref 4.22–5.81)
RDW: 19.5 % — ABNORMAL HIGH (ref 11.5–15.5)
WBC Count: 4.6 10*3/uL (ref 4.0–10.5)
nRBC: 0 % (ref 0.0–0.2)

## 2021-03-03 LAB — CMP (CANCER CENTER ONLY)
ALT: 10 U/L (ref 0–44)
AST: 22 U/L (ref 15–41)
Albumin: 3.4 g/dL — ABNORMAL LOW (ref 3.5–5.0)
Alkaline Phosphatase: 73 U/L (ref 38–126)
Anion gap: 10 (ref 5–15)
BUN: 14 mg/dL (ref 8–23)
CO2: 19 mmol/L — ABNORMAL LOW (ref 22–32)
Calcium: 8.7 mg/dL — ABNORMAL LOW (ref 8.9–10.3)
Chloride: 112 mmol/L — ABNORMAL HIGH (ref 98–111)
Creatinine: 0.81 mg/dL (ref 0.61–1.24)
GFR, Estimated: >60 mL/min (ref >60)
Glucose, Bld: 79 mg/dL (ref 70–99)
Potassium: 3.2 mmol/L — ABNORMAL LOW (ref 3.5–5.1)
Sodium: 141 mmol/L (ref 135–145)
Total Bilirubin: 0.7 mg/dL (ref 0.3–1.2)
Total Protein: 6.8 g/dL (ref 6.5–8.1)

## 2021-03-03 LAB — TSH: TSH: 1.615 u[IU]/mL (ref 0.320–4.118)

## 2021-03-03 LAB — FERRITIN: Ferritin: 24 ng/mL (ref 24–336)

## 2021-03-03 LAB — IRON AND TIBC
Iron: 65 ug/dL (ref 42–163)
Saturation Ratios: 16 % — ABNORMAL LOW (ref 20–55)
TIBC: 395 ug/dL (ref 202–409)
UIBC: 330 ug/dL (ref 117–376)

## 2021-03-03 MED ORDER — SODIUM CHLORIDE 0.9 % IV SOLN: Freq: Once | INTRAVENOUS | Status: AC

## 2021-03-03 MED ORDER — SODIUM CHLORIDE 0.9 % IV SOLN
240.0000 mg | Freq: Once | INTRAVENOUS | Status: AC
  Administered 2021-03-03: 240 mg via INTRAVENOUS
  Filled 2021-03-03: qty 24

## 2021-03-03 NOTE — Progress Notes (Signed)
[place]   [phone]) [phone] [phone]   Clinic Follow up Note   Patient Care Team: [name], [name], MD as PCP - General (Family Medicine) [name], MD as Consulting Physician (Gastroenterology) [name], MD as Consulting Physician (Hematology) [name], NP as Nurse Practitioner (Nurse Practitioner) [name], RN as Registered Nurse  Date of Service:  03/03/2021  CHIEF COMPLAINT: f/u of esophageal cancer  CURRENT THERAPY:  -Xeloda 1539m q12h, 1 week on/1 week off, starting 01/06/21  -Nivolumab q2weeks, to start 01/07/21  ASSESSMENT & PLAN:  Randy ALVILLARis a 80y.o. male with   1. Adenocarcinoma of the distal esophagus/GE junction, with liver and node metastasis, HER2-, MMR normal, PD-L1 3% -He presented with progressive dysphagia and weight loss, work-up showed moderate stenosis in the distal third of the esophagus invading the gastric cardia, path 11/25/20 confirmed poorly differentiated adenocarcinoma.   -PET 12/03/20 showed: esophageal tumor with local perigastric infiltration, hepatic, and nodal metastases; nodal involvement both in chest and abdomen extending to lower retroperitoneum; burst fracture at L5 with increased metabolic activity. -Molecular testing showed MMR normal, PD-L1 CPS 3% -He received palliative radiation therapy to primary tumor 9/15-9/30/22 -he began single agent Xeloda (oral), and nivolumab (IV) every 2 weeks on 01/07/21. He is tolerating well  -labs reviewed, adequate for treatment, plan to start cycle 5 nivolumab today, he started Xeloda this morning. -plan for restaging scan near the end of the month, and to change his Nivo to every 4 weeks in Jan if he continues to tolerate well    2.  Dysphagia, odynophagia, and weight loss -Onset early 2February 26, 2022after hospitalization for bacterial peritonitis, 40 lbs in 6 months  -weight fluctuates but has been overall stable since 10/2020. -endoscopy 11/11/20 showed  numerous pills in middle/distal esophagus which were removed. -continue follow up with nutrition   3.  Cirrhosis, abdominal pain -felt to be alcohol-related. He has abstained from alcohol since 226-Feb-2022-S/p paracentesis 05/09/20 yielding 4.4 liters, path showed reactive mesothelial cells -Compensated, hepatic function panel 09/17/20 WNL -I have stopped his lasix, will continue spironolactone -followed by [facility]   4. Social support -He is widowed and lives alone, [place] she was treated here -2 daughters live out of town, [place], [place] his primary contact for healthcare -He has church community and some family friends who could help if needed -Patient is independent with ADLs but has a hSecretary/administratorand yard service. I previously referred him to social work.   5. Family history  -Patient's mother had ovarian cancer, father had prostate cancer, and daughter [name] breast cancer at age 81-His daughter JSharyn Lullwithout cancer underwent genetic testing and is negative -He was referred to genetics and underwent testing on 12/09/20. Results were negative.     PLAN: -proceed to Nivo today -continue Xeloda, 1500 mg BID 1 week on/1 week off -lab, f/u, and next Nivo as scheduled on 03/17/21   No problem-specific Assessment & Plan notes found for this encounter.   SUMMARY OF ONCOLOGIC HISTORY: Oncology History Overview Note  Cancer Staging No matching staging information was found for the patient.    [place] adenocarcinoma of distal third of [place]ophagus [place]  11/11/2020 Procedure   EGD by Dr. MWatt Climes- Normal larynx. -? Malignant-appearing esophageal stenosis. Biopsied. - Pills were found in the esophagus. Removal was successful. - Rule out malignancy,? gastric tumor in the cardia difficult  to say based on bleeding from passing the scope and unable to wash and suction for adequate visualization. - Normal ampulla, duodenal bulb,  first portion of the duodenum, second portion of the duodenum, major papilla and area of the papilla. - The examination was otherwise normal.   11/11/2020 Initial Biopsy   FINAL MICROSCOPIC DIAGNOSIS:  A. ESOPHAGUS, DISTAL, BIOPSY:  -  Poorly differentiated adenocarcinoma  -  See comment    11/11/2020 Initial Diagnosis   Primary adenocarcinoma of distal third of esophagus ([place])   11/11/2020 Cancer Staging   Staging form: Esophagus - Adenocarcinoma, AJCC 8th Edition - Clinical stage from 11/11/2020: Stage IVB (cTX, cN1, cM1) - Signed by [name], MD on 12/26/2020    11/14/2020 Imaging   CT CAPIMPRESSION: 1. Aggressive appearing new tumor of the gastroesophageal junction confluent with the irregular collection of right gastric lymph nodes, and with new associated metastatic retroperitoneal adenopathy. Small paraesophageal lymph nodes in the thorax are increased in size from prior exam and could also be involved, and there is a nonspecific 1.0 cm lesion inferiorly in the right hepatic lobe which could potentially be metastatic. 2. New subacute burst (AO type A4) fracture of L5, with mild posterior bony retropulsion and with a dominant coronally oriented fracture plane, and about 50% loss of height. Correlate with interval trauma. 3. Slight thickening along the left paracolic gutter and trace pelvic ascites, although the ascites is markedly reduced compared to 05/04/2020. 4. Other imaging findings of potential clinical significance: Aortic Atherosclerosis (ICD10-I70.0). Coronary atherosclerosis. Old compression fracture at L2.   12/03/2020 PET scan   IMPRESSION: Tumor at the GE junction with local perigastric infiltration, hepatic and nodal metastases.   Nodal involvement both in the chest and abdomen extending to the lower retroperitoneum in the abdomen.   Burst fracture at L5 shows signs of increased metabolic activity, nonspecific in the setting of fracture, in light of other  findings would correlate with any history of trauma, if no history of trauma consider MRI to exclude the possibility of pathologic fracture.   12/27/2020 Genetic Testing   Negative hereditary cancer genetic testing: no pathogenic variants detected in [facility] Multi-Cancer +RNA Panel.  The report date is December 27, 2020.   The Multi-Cancer + RNA Panel offered by [facility] includes sequencing and/or deletion/duplication analysis of the following 84 genes:  AIP*, ALK, APC*, ATM*, AXIN2*, BAP1*, BARD1*, BLM*, BMPR1A*, BRCA1*, BRCA2*, BRIP1*, CASR, CDC73*, CDH1*, CDK4, CDKN1B*, CDKN1C*, CDKN2A, CEBPA, CHEK2*, CTNNA1*, DICER1*, DIS3L2*, EGFR, EPCAM, FH*, FLCN*, GATA2*, GPC3, GREM1, HOXB13, HRAS, KIT, MAX*, MEN1*, MET, MITF, MLH1*, MSH2*, MSH3*, MSH6*, MUTYH*, NBN*, NF1*, NF2*, NTHL1*, PALB2*, PDGFRA, PHOX2B, PMS2*, POLD1*, POLE*, POT1*, PRKAR1A*, PTCH1*, PTEN*, RAD50*, RAD51C*, RAD51D*, RB1*, RECQL4, RET, RUNX1*, SDHA*, SDHAF2*, SDHB*, SDHC*, SDHD*, SMAD4*, SMARCA4*, SMARCB1*, SMARCE1*, STK11*, SUFU*, TERC, TERT, TMEM127*, Tp53*, TSC1*, TSC2*, VHL*, WRN*, and WT1.  RNA analysis is performed for * genes.   01/07/2021 -  Chemotherapy   Patient is on Treatment Plan : GASTROESOPHAGEAL Nivolumab q14d x 8 cycles / Nivolumab q28d        INTERVAL HISTORY:  Randy Wood is here for a follow up of esophageal cancer. He was last seen by me on 02/03/21. He presents to the clinic accompanied by his daughter. He reports he continues to do well. He has a bandaid on his forehead today-- he explains he "collided" with a sofa armchair after tripping on an extension cord his granddaughter had run through his office. He also had left eye surgery last week;  his eye is still a bit red today.   All other systems were reviewed with the patient and are negative.  MEDICAL HISTORY:  Past Medical History:  Diagnosis Date   AKI (acute kidney injury) ([place])    Alcohol abuse    Arthritis    Cataract 2006   Diarrhea    Edema  leg    Esophageal cancer ([place])    Family history of breast cancer 12/09/2020   Family history of ovarian cancer 12/09/2020   Family history of prostate cancer 12/09/2020   GERD (gastroesophageal reflux disease)    Glaucoma unknown   Hyperbilirubinemia    Hyperlipemia    Hypertension    Hypokalemia    Liver failure (HCC)    Macular degeneration, wet (HCC)    Microcytic anemia    Osteoarthritis    SOB (shortness of breath)    Weight gain     SURGICAL HISTORY: Past Surgical History:  Procedure Laterality Date   ANKLE SURGERY     BIOPSY  11/11/2020   Procedure: BIOPSY;  Surgeon: [name], MD;  Location: [facility];  Service: Gastroenterology;;   CATARACT EXTRACTION, BILATERAL     ESOPHAGOGASTRODUODENOSCOPY (EGD) WITH PROPOFOL N/A 11/11/2020   Procedure: ESOPHAGOGASTRODUODENOSCOPY (EGD) WITH PROPOFOL;  Surgeon: [name], MD;  Location: [facility];  Service: Gastroenterology;  Laterality: N/A;   EYE SURGERY  2006   FOREIGN BODY REMOVAL  11/11/2020   Procedure: FOREIGN BODY REMOVAL;  Surgeon: [name], MD;  Location: [facility];  Service: Gastroenterology;;   IR PARACENTESIS  05/09/2020   TONSILLECTOMY      I have reviewed the social history and family history with the patient and they are unchanged from previous note.  ALLERGIES:  is allergic to lisinopril.  MEDICATIONS:  Current Outpatient Medications  Medication Sig Dispense Refill   capecitabine (XELODA) 500 MG tablet Take 3 tabs in the morning and evening, 30 minutes after meal,  for 7 days on and 7 days off 84 tablet 2   cetirizine (ZYRTEC) 10 MG tablet Take 10 mg by mouth daily.     ezetimibe (ZETIA) 10 MG tablet Take 1 tablet (10 mg total) by mouth daily. 90 tablet 3   fluticasone (FLONASE) 50 MCG/ACT nasal spray Place 1 spray into both nostrils daily as needed for allergies.     GLUCOSAMINE-CHONDROITIN PO Take 1 capsule by mouth daily.     lactulose (CHRONULAC) 10 GM/15ML solution Take 15 mLs (10 g  total) by mouth daily as needed for mild constipation. 236 mL 0   moxifloxacin (VIGAMOX) 0.5 % ophthalmic solution Place one drop into the left eye 4 (four) times daily. Do not take tonight.  Bring to office tomorrow.     Multiple Vitamins-Minerals (ONE-A-DAY MENS 50+) TABS Take 1 tablet by mouth daily.     Multiple Vitamins-Minerals (PRESERVISION AREDS 2 PO) Take 1 tablet by mouth 2 (two) times daily.     Netarsudil Dimesylate (RHOPRESSA) 0.02 % SOLN Place 1 drop into the left eye daily.     spironolactone (ALDACTONE) 50 MG tablet Take 1 tablet (50 mg total) by mouth daily. 30 tablet 2   sucralfate (CARAFATE) 1 g tablet Take 1 tablet (1 g total) by mouth 4 (four) times daily. Dissolve each tablet in 15 cc water before use. 120 tablet 2   EPINEPHrine 0.3 mg/0.3 mL IJ SOAJ injection Inject 0.3 mg into the muscle as needed for anaphylaxis.     No current facility-administered medications for this visit.  PHYSICAL EXAMINATION: ECOG PERFORMANCE STATUS: 2 - Symptomatic, <50% confined to bed  Vitals:   03/03/21 1333  BP: 138/60  Pulse: 71  Resp: 17  Temp: 98.1 F (36.7 C)  SpO2: 100%   Wt Readings from Last 3 Encounters:  03/03/21 164 lb 3.2 oz (74.5 kg)  02/27/21 158 lb 3.2 oz (71.8 kg)  02/17/21 159 lb 3 oz (72.2 kg)     GENERAL:alert, no distress and comfortable SKIN: skin color normal, no rashes or significant lesions EYES: normal, Conjunctiva are pink and non-injected, sclera clear  NEURO: alert & oriented x 3 with fluent speech  LABORATORY DATA:  I have reviewed the data as listed CBC Latest Ref Rng & Units 03/03/2021 02/17/2021 02/03/2021  WBC 4.0 - 10.5 K/uL 4.6 4.2 4.2  Hemoglobin 13.0 - 17.0 g/dL 11.8(L) 11.8(L) 11.2(L)  Hematocrit 39.0 - 52.0 % 35.9(L) 35.6(L) 33.3(L)  Platelets 150 - 400 K/uL 132(L) 156 150     CMP Latest Ref Rng & Units 03/03/2021 02/17/2021 02/03/2021  Glucose 70 - 99 mg/dL 79 104(H) 215(H)  BUN 8 - 23 mg/dL _0 Creatinine 0.61 - 1.24 mg/dL  0.81 0.87 0.90  Sodium 135 - 145 mmol/L 141 140 137  Potassium 3.5 - 5.1 mmol/L 3.2(L) 3.8 3.9  Chloride 98 - 111 mmol/L 112(H) 114(H) 109  CO2 22 - 32 mmol/L 19(L) 19(L) 21(L)  Calcium 8.9 - 10.3 mg/dL 8.7(L) 9.0 8.8(L)  Total Protein 6.5 - 8.1 g/dL 6.8 6.7 6.3(L)  Total Bilirubin 0.3 - 1.2 mg/dL 0.7 0.7 0.7  Alkaline Phos 38 - 126 U/L 73 70 74  AST 15 - 41 U/L _1 ALT 0 - 44 U/L _2 RADIOGRAPHIC STUDIES: I have personally reviewed the radiological images as listed and agreed with the findings in the report. No results found.    Orders Placed This Encounter  Procedures   CT CHEST ABDOMEN PELVIS W CONTRAST    Standing Status:   Future    Standing Expiration Date:   03/03/2022    Order Specific Question:   Preferred imaging location?    Answer:   [facility]    Order Specific Question:   Is Oral Contrast requested for this exam?    Answer:   Yes, Per Radiology protocol   Lipid panel    Standing Status:   Future    Standing Expiration Date:   03/03/2022   All questions were answered. The patient knows to call the clinic with any problems, questions or concerns. No barriers to learning was detected. The total time spent in the appointment was 30 minutes.     [name], MD 03/03/2021   I, Wilburn Mylar, am acting as scribe for [name], MD.   I have reviewed the above documentation for accuracy and completeness, and I agree with the above.

## 2021-03-03 NOTE — Patient Instructions (Signed)
[facility]ICAL [facility]COLOG[facility]  Discharge Instructions: Thank you for choosing [facility] to provide your oncology and hematology care.   If you have a lab appointment with the Cancer Center, please go directly to the Cancer Center and check in at the registration area.   Wear comfortable clothing and clothing appropriate for easy access to any Portacath or PICC line.   We strive to give you quality time with your provider. You may need to reschedule your appointment if you arrive late (15 or more minutes).  Arriving late affects you and other patients whose appointments are after yours.  Also, if you miss three or more appointments without notifying the office, you may be dismissed from the clinic at the provider's discretion.      For prescription refill requests, have your pharmacy contact our office and allow 72 hours for refills to be completed.    Today you received the following chemotherapy and/or immunotherapy agents opdivo   To help prevent nausea and vomiting after your treatment, we encourage you to take your nausea medication as directed.  BELOW ARE SYMPTOMS THAT SHOULD BE REPORTED IMMEDIATELY: *FEVER GREATER THAN 100.4 F (38 C) OR HIGHER *CHILLS OR SWEATING *NAUSEA AND VOMITING THAT IS NOT CONTROLLED WITH YOUR NAUSEA MEDICATION *UNUSUAL SHORTNESS OF BREATH *UNUSUAL BRUISING OR BLEEDING *URINARY PROBLEMS (pain or burning when urinating, or frequent urination) *BOWEL PROBLEMS (unusual diarrhea, constipation, pain near the anus) TENDERNESS IN MOUTH AND THROAT WITH OR WITHOUT PRESENCE OF ULCERS (sore throat, sores in mouth, or a toothache) UNUSUAL RASH, SWELLING OR PAIN  UNUSUAL VAGINAL DISCHARGE OR ITCHING   Items with * indicate a potential emergency and should be followed up as soon as possible or go to the Emergency Department if any problems should occur.  Please show the CHEMOTHERAPY ALERT CARD or IMMUNOTHERAPY ALERT CARD at check-in to the  Emergency Department and triage nurse.  Should you have questions after your visit or need to cancel or reschedule your appointment, please contact [facility]  Dept: [phone]  and follow the prompts.  Office hours are 8:00 a.m. to 4:30 p.m. Monday - Friday. Please note that voicemails left after 4:00 p.m. may not be returned until the following business day.  We are closed weekends and major holidays. You have access to a nurse at all times for urgent questions. Please call the main number to the clinic Dept: [phone] and follow the prompts.   For any non-urgent questions, you may also contact your provider using MyChart. We now offer e-Visits for anyone 18 and older to request care online for non-urgent symptoms. For details visit [email or web address].   Also download the MyChart app! Go to the app store, search "MyChart", open the app, select [facility], and log in with your MyChart username and password.  Due to Covid, a mask is required upon entering the hospital/clinic. If you do not have a mask, one will be given to you upon arrival. For doctor visits, patients may have 1 support person aged 18 or older with them. For treatment visits, patients cannot have anyone with them due to current Covid guidelines and our immunocompromised population.   

## 2021-03-03 NOTE — Progress Notes (Signed)
Treatment given per orders. Patient tolerated it well without problems. Vitals stable and discharged home from clinic ambulatory. Follow up as scheduled.  

## 2021-03-04 DIAGNOSIS — L988 Other specified disorders of the skin and subcutaneous tissue: Secondary | ICD-10-CM | POA: Diagnosis not present

## 2021-03-04 DIAGNOSIS — D485 Neoplasm of uncertain behavior of skin: Secondary | ICD-10-CM | POA: Diagnosis not present

## 2021-03-04 LAB — T4: T4, Total: 11.9 ug/dL (ref 4.5–12.0)

## 2021-03-10 ENCOUNTER — Other Ambulatory Visit: Payer: Medicare Other

## 2021-03-10 ENCOUNTER — Ambulatory Visit: Payer: Medicare Other

## 2021-03-10 ENCOUNTER — Ambulatory Visit: Payer: Medicare Other | Admitting: Hematology

## 2021-03-17 ENCOUNTER — Inpatient Hospital Stay: Payer: Medicare Other

## 2021-03-17 ENCOUNTER — Inpatient Hospital Stay (HOSPITAL_BASED_OUTPATIENT_CLINIC_OR_DEPARTMENT_OTHER): Payer: Medicare Other | Admitting: Hematology

## 2021-03-17 ENCOUNTER — Other Ambulatory Visit: Payer: Self-pay

## 2021-03-17 ENCOUNTER — Encounter: Payer: Self-pay | Admitting: Hematology

## 2021-03-17 ENCOUNTER — Inpatient Hospital Stay: Payer: Medicare Other | Admitting: Nutrition

## 2021-03-17 VITALS — BP 132/60 | HR 74 | Temp 98.0°F | Resp 18 | Ht 71.0 in | Wt 163.0 lb

## 2021-03-17 DIAGNOSIS — C7951 Secondary malignant neoplasm of bone: Secondary | ICD-10-CM | POA: Diagnosis not present

## 2021-03-17 DIAGNOSIS — Z5111 Encounter for antineoplastic chemotherapy: Secondary | ICD-10-CM | POA: Diagnosis not present

## 2021-03-17 DIAGNOSIS — Z79899 Other long term (current) drug therapy: Secondary | ICD-10-CM | POA: Diagnosis not present

## 2021-03-17 DIAGNOSIS — Z923 Personal history of irradiation: Secondary | ICD-10-CM | POA: Diagnosis not present

## 2021-03-17 DIAGNOSIS — C155 Malignant neoplasm of lower third of esophagus: Secondary | ICD-10-CM

## 2021-03-17 DIAGNOSIS — C787 Secondary malignant neoplasm of liver and intrahepatic bile duct: Secondary | ICD-10-CM | POA: Diagnosis not present

## 2021-03-17 LAB — CBC WITH DIFFERENTIAL (CANCER CENTER ONLY)
Abs Immature Granulocytes: 0.01 10*3/uL (ref 0.00–0.07)
Basophils Absolute: 0 10*3/uL (ref 0.0–0.1)
Basophils Relative: 1 %
Eosinophils Absolute: 0.1 10*3/uL (ref 0.0–0.5)
Eosinophils Relative: 2 %
HCT: 37.9 % — ABNORMAL LOW (ref 39.0–52.0)
Hemoglobin: 12.2 g/dL — ABNORMAL LOW (ref 13.0–17.0)
Immature Granulocytes: 0 %
Lymphocytes Relative: 28 %
Lymphs Abs: 1.1 10*3/uL (ref 0.7–4.0)
MCH: 32.8 pg (ref 26.0–34.0)
MCHC: 32.2 g/dL (ref 30.0–36.0)
MCV: 101.9 fL — ABNORMAL HIGH (ref 80.0–100.0)
Monocytes Absolute: 0.3 10*3/uL (ref 0.1–1.0)
Monocytes Relative: 8 %
Neutro Abs: 2.5 10*3/uL (ref 1.7–7.7)
Neutrophils Relative %: 61 %
Platelet Count: 140 10*3/uL — ABNORMAL LOW (ref 150–400)
RBC: 3.72 MIL/uL — ABNORMAL LOW (ref 4.22–5.81)
RDW: 19 % — ABNORMAL HIGH (ref 11.5–15.5)
WBC Count: 4.1 10*3/uL (ref 4.0–10.5)
nRBC: 0 % (ref 0.0–0.2)

## 2021-03-17 LAB — CMP (CANCER CENTER ONLY)
ALT: 9 U/L (ref 0–44)
AST: 21 U/L (ref 15–41)
Albumin: 3.7 g/dL (ref 3.5–5.0)
Alkaline Phosphatase: 75 U/L (ref 38–126)
Anion gap: 8 (ref 5–15)
BUN: 13 mg/dL (ref 8–23)
CO2: 21 mmol/L — ABNORMAL LOW (ref 22–32)
Calcium: 9.1 mg/dL (ref 8.9–10.3)
Chloride: 109 mmol/L (ref 98–111)
Creatinine: 0.91 mg/dL (ref 0.61–1.24)
GFR, Estimated: >60 mL/min (ref >60)
Glucose, Bld: 96 mg/dL (ref 70–99)
Potassium: 4.1 mmol/L (ref 3.5–5.1)
Sodium: 138 mmol/L (ref 135–145)
Total Bilirubin: 1 mg/dL (ref 0.3–1.2)
Total Protein: 7.2 g/dL (ref 6.5–8.1)

## 2021-03-17 LAB — FERRITIN: Ferritin: 17 ng/mL — ABNORMAL LOW (ref 24–336)

## 2021-03-17 LAB — IRON AND TIBC
Iron: 84 ug/dL (ref 42–163)
Saturation Ratios: 18 % — ABNORMAL LOW (ref 20–55)
TIBC: 455 ug/dL — ABNORMAL HIGH (ref 202–409)
UIBC: 372 ug/dL (ref 117–376)

## 2021-03-17 LAB — TSH: TSH: 2.409 u[IU]/mL (ref 0.320–4.118)

## 2021-03-17 MED ORDER — SODIUM CHLORIDE 0.9 % IV SOLN: Freq: Once | INTRAVENOUS | Status: AC

## 2021-03-17 MED ORDER — SODIUM CHLORIDE 0.9 % IV SOLN
240.0000 mg | Freq: Once | INTRAVENOUS | Status: AC
  Administered 2021-03-17: 11:00:00 240 mg via INTRAVENOUS
  Filled 2021-03-17: qty 24

## 2021-03-17 NOTE — Patient Instructions (Signed)
[facility] Discharge Instructions for Patients Receiving Chemotherapy  Today you received the following chemotherapy agents opdivo   To help prevent nausea and vomiting after your treatment, we encourage you to take your nausea medication as directed.    If you develop nausea and vomiting that is not controlled by your nausea medication, call the clinic.   BELOW ARE SYMPTOMS THAT SHOULD BE REPORTED IMMEDIATELY: *FEVER GREATER THAN 100.5 F *CHILLS WITH OR WITHOUT FEVER NAUSEA AND VOMITING THAT IS NOT CONTROLLED WITH YOUR NAUSEA MEDICATION *UNUSUAL SHORTNESS OF BREATH *UNUSUAL BRUISING OR BLEEDING TENDERNESS IN MOUTH AND THROAT WITH OR WITHOUT PRESENCE OF ULCERS *URINARY PROBLEMS *BOWEL PROBLEMS UNUSUAL RASH Items with * indicate a potential emergency and should be followed up as soon as possible.  Feel free to call the clinic you have any questions or concerns. The clinic phone number is [phone].  Hypokalemia Hypokalemia means that the amount of potassium in the blood is lower than normal. Potassium is a chemical (electrolyte) that helps regulate the amount of fluid in the body. It also stimulates muscle tightening (contraction) and helps nerves work properly. Normally, most of the body's potassium is inside cells, and only a very small amount is in the blood. Because the amount in the blood is so small, minor changes to potassium levels in the blood can be life-threatening. What are the causes? This condition may be caused by: Antibiotic medicine. Diarrhea or vomiting. Taking too much of a medicine that helps you have a bowel movement (laxative) can cause diarrhea and lead to hypokalemia. Chronic kidney disease (CKD). Medicines that help the body get rid of excess fluid (diuretics). Eating disorders, such as bulimia. Low magnesium levels in the body. Sweating a lot. What are the signs or symptoms? Symptoms of this condition  include: Weakness. Constipation. Fatigue. Muscle cramps. Mental confusion. Skipped heartbeats or irregular heartbeat (palpitations). Tingling or numbness. How is this diagnosed? This condition is diagnosed with a blood test. How is this treated? This condition may be treated by: Taking potassium supplements by mouth. Adjusting the medicines that you take. Eating more foods that contain a lot of potassium. If your potassium level is very low, you may need to get potassium through an IV and be monitored in the hospital. Follow these instructions at home:  Take over-the-counter and prescription medicines only as told by your health care provider. This includes vitamins and supplements. Eat a healthy diet. A healthy diet includes fresh fruits and vegetables, whole grains, healthy fats, and lean proteins. If instructed, eat more foods that contain a lot of potassium. This includes: Nuts, such as peanuts and pistachios. Seeds, such as sunflower seeds and pumpkin seeds. Peas, lentils, and lima beans. Whole grain and bran cereals and breads. Fresh fruits and vegetables, such as apricots, avocado, bananas, cantaloupe, kiwi, oranges, tomatoes, asparagus, and potatoes. Orange juice. Tomato juice. Red meats. Yogurt. Keep all follow-up visits as told by your health care provider. This is important. Contact a health care provider if you: Have weakness that gets worse. Feel your heart pounding or racing. Vomit. Have diarrhea. Have diabetes (diabetes mellitus) and you have trouble keeping your blood sugar (glucose) in your target range. Get help right away if you: Have chest pain. Have shortness of breath. Have vomiting or diarrhea that lasts for more than 2 days. Faint. Summary Hypokalemia means that the amount of potassium in the blood is lower than normal. This condition is diagnosed with a blood test. Hypokalemia may be treated by  taking potassium supplements, adjusting the medicines  that you take, or eating more foods that are high in potassium. If your potassium level is very low, you may need to get potassium through an IV and be monitored in the hospital. This information is not intended to replace advice given to you by your health care provider. Make sure you discuss any questions you have with your health care provider. Document Revised: 10/26/2017 Document Reviewed: 10/27/2017 Elsevier Patient Education  [employer].

## 2021-03-17 NOTE — Progress Notes (Signed)
Nutrition follow-up completed during infusion for esophageal cancer.  Patient's weight is stable at 163 pounds.  He denies difficulty eating/swallowing.  He denies nausea, vomiting, constipation, diarrhea.  He has no questions or concerns.  Nutrition diagnosis: Inadequate oral intake improved.  Intervention: Educated to continue strategies for adequate calories and protein for continued weight maintenance.   Continue oral nutrition supplements as needed. Continue bowel regimen.  Monitoring, evaluation, goals: Patient will continue to tolerate adequate calories and protein for weight maintenance.  Next visit: To be scheduled as needed with immunotherapy.  **Disclaimer: This note was dictated with voice recognition software. Similar sounding words can inadvertently be transcribed and this note may contain transcription errors which may not have been corrected upon publication of note.**

## 2021-03-17 NOTE — Progress Notes (Signed)
[place]   [phone] [phone]   Clinic Follow up Note   Patient Care Team: [name], [name], MD as PCP - General (Family Medicine) [name], MD as Consulting Physician (Gastroenterology) [name], MD as Consulting Physician (Hematology) [name], NP as Nurse Practitioner (Nurse Practitioner) [name], RN as Registered Nurse  Date of Service:  03/17/2021  CHIEF COMPLAINT: f/u of esophageal cancer  CURRENT THERAPY:  -Xeloda 1558m q12h, 1 week on/1 week off, starting 01/06/21  -Nivolumab q2weeks, to start 01/07/21  ASSESSMENT & PLAN:  Randy MCCREADIEis a 80y.o. male with   1. Adenocarcinoma of the distal esophagus/GE junction, with liver and node metastasis, HER2-, MMR normal, PD-L1 3% -He presented with progressive dysphagia and weight loss, work-up showed moderate stenosis in the distal third of the esophagus invading the gastric cardia, path 11/25/20 confirmed poorly differentiated adenocarcinoma.   -PET 12/03/20 showed: esophageal tumor with local perigastric infiltration, hepatic, and nodal metastases; nodal involvement both in chest and abdomen extending to lower retroperitoneum; burst fracture at L5 with increased metabolic activity. -Molecular testing showed MMR normal, PD-L1 CPS 3% -He received palliative radiation therapy to primary tumor 9/15-9/30/22 -he began single agent Xeloda (oral), and nivolumab (IV) every 2 weeks on 01/07/21. He is tolerating well  -labs reviewed, adequate for treatment, plan to start cycle 6 nivolumab today, he started Xeloda this morning. -restaging scan scheduled for 03/26/21, will plan to change his Nivo to every 4 weeks in Jan if he continues to tolerate well    2.  Dysphagia, odynophagia, and weight loss -Onset early 203/11/22after hospitalization for bacterial peritonitis, 40 lbs weight loss at his diagnosis  -weight fluctuates but has been overall stable since 10/2020. -overall much  improved, he is eating regular food now  -continue follow up with nutrition   3.  Cirrhosis, abdominal pain -felt to be alcohol-related. He has abstained from alcohol since 22022-03-11-S/p paracentesis 05/09/20 yielding 4.4 liters, path showed reactive mesothelial cells -Compensated, hepatic function panel 09/17/20 WNL -I have stopped his lasix, will continue spironolactone -followed by [facility]   4. Social support -He is widowed and lives alone, [place] she was treated here -2 daughters live out of town, [place] primary contact for healthcare -He has church community and some family friends who could help if needed -Patient is independent with ADLs but has a hSecretary/administratorand yard service. I previously referred him to social work.   5. Family history  -Patient's mother had ovarian cancer, father had prostate cancer, and daughter Randy Rickshad breast cancer at age 81-His daughter Randy Lullwithout cancer underwent genetic testing and is negative -He was referred to genetics and underwent testing on 12/09/20. Results were negative.     PLAN: -proceed to Nivo today -continue Xeloda, 1500 mg BID 1 week on/1 week off -CT CAP on 12/28 -lab, f/u, and next Nivo as scheduled on 04/01/21, plan to change to every 4 week on next treatment if CT scan no cancer progression    No problem-specific Assessment & Plan notes found for this encounter.   SUMMARY OF ONCOLOGIC HISTORY: Oncology History Overview Note  Cancer Staging No matching staging information was found for the patient.    [place] adenocarcinoma of distal third of [place]ophagus [place]  11/11/2020 Procedure   EGD by Dr. MWatt Climes- Normal larynx. -? Malignant-appearing esophageal stenosis. Biopsied. - Pills  were found in the esophagus. Removal was successful. - Rule out malignancy,? gastric tumor in the cardia difficult to say based on bleeding from passing the scope and unable to  wash and suction for adequate visualization. - Normal ampulla, duodenal bulb, first portion of the duodenum, second portion of the duodenum, major papilla and area of the papilla. - The examination was otherwise normal.   11/11/2020 Initial Biopsy   FINAL MICROSCOPIC DIAGNOSIS:  A. ESOPHAGUS, DISTAL, BIOPSY:  -  Poorly differentiated adenocarcinoma  -  See comment    11/11/2020 Initial Diagnosis   Primary adenocarcinoma of distal third of esophagus ([place])   11/11/2020 Cancer Staging   Staging form: Esophagus - Adenocarcinoma, AJCC 8th Edition - Clinical stage from 11/11/2020: Stage IVB (cTX, cN1, cM1) - Signed by [name], MD on 12/26/2020    11/14/2020 Imaging   CT CAPIMPRESSION: 1. Aggressive appearing new tumor of the gastroesophageal junction confluent with the irregular collection of right gastric lymph nodes, and with new associated metastatic retroperitoneal adenopathy. Small paraesophageal lymph nodes in the thorax are increased in size from prior exam and could also be involved, and there is a nonspecific 1.0 cm lesion inferiorly in the right hepatic lobe which could potentially be metastatic. 2. New subacute burst (AO type A4) fracture of L5, with mild posterior bony retropulsion and with a dominant coronally oriented fracture plane, and about 50% loss of height. Correlate with interval trauma. 3. Slight thickening along the left paracolic gutter and trace pelvic ascites, although the ascites is markedly reduced compared to 05/04/2020. 4. Other imaging findings of potential clinical significance: Aortic Atherosclerosis (ICD10-I70.0). Coronary atherosclerosis. Old compression fracture at L2.   12/03/2020 PET scan   IMPRESSION: Tumor at the GE junction with local perigastric infiltration, hepatic and nodal metastases.   Nodal involvement both in the chest and abdomen extending to the lower retroperitoneum in the abdomen.   Burst fracture at L5 shows signs of increased  metabolic activity, nonspecific in the setting of fracture, in light of other findings would correlate with any history of trauma, if no history of trauma consider MRI to exclude the possibility of pathologic fracture.   12/27/2020 Genetic Testing   Negative hereditary cancer genetic testing: no pathogenic variants detected in [facility] Multi-Cancer +RNA Panel.  The report date is December 27, 2020.   The Multi-Cancer + RNA Panel offered by [facility] includes sequencing and/or deletion/duplication analysis of the following 84 genes:  AIP*, ALK, APC*, ATM*, AXIN2*, BAP1*, BARD1*, BLM*, BMPR1A*, BRCA1*, BRCA2*, BRIP1*, CASR, CDC73*, CDH1*, CDK4, CDKN1B*, CDKN1C*, CDKN2A, CEBPA, CHEK2*, CTNNA1*, DICER1*, DIS3L2*, EGFR, EPCAM, FH*, FLCN*, GATA2*, GPC3, GREM1, HOXB13, HRAS, KIT, MAX*, MEN1*, MET, MITF, MLH1*, MSH2*, MSH3*, MSH6*, MUTYH*, NBN*, NF1*, NF2*, NTHL1*, PALB2*, PDGFRA, PHOX2B, PMS2*, POLD1*, POLE*, POT1*, PRKAR1A*, PTCH1*, PTEN*, RAD50*, RAD51C*, RAD51D*, RB1*, RECQL4, RET, RUNX1*, SDHA*, SDHAF2*, SDHB*, SDHC*, SDHD*, SMAD4*, SMARCA4*, SMARCB1*, SMARCE1*, STK11*, SUFU*, TERC, TERT, TMEM127*, Tp53*, TSC1*, TSC2*, VHL*, WRN*, and WT1.  RNA analysis is performed for * genes.   01/07/2021 -  Chemotherapy   Patient is on Treatment Plan : GASTROESOPHAGEAL Nivolumab q14d x 8 cycles / Nivolumab q28d        INTERVAL HISTORY:  Randy Wood is here for a follow up of esophageal cancer. He was last seen by me on 03/03/21. He presents to the clinic accompanied by his daughter. He reports he continues to do well with treatment.   All other systems were reviewed with the patient and are negative.  MEDICAL HISTORY:  Past Medical History:  Diagnosis Date   AKI (acute kidney injury) ([place])    Alcohol abuse    Arthritis    Cataract 2006   Diarrhea    Edema leg    Esophageal cancer ([place])    Family history of breast cancer 12/09/2020   Family history of ovarian cancer 12/09/2020   Family history of  prostate cancer 12/09/2020   GERD (gastroesophageal reflux disease)    Glaucoma unknown   Hyperbilirubinemia    Hyperlipemia    Hypertension    Hypokalemia    Liver failure ([facility])    Macular degeneration, wet ([facility])    Microcytic anemia    Osteoarthritis    SOB (shortness of breath)    Weight gain     SURGICAL HISTORY: Past Surgical History:  Procedure Laterality Date   ANKLE SURGERY     BIOPSY  11/11/2020   Procedure: BIOPSY;  Surgeon: [name], MD;  Location: [facility];  Service: Gastroenterology;;   CATARACT EXTRACTION, BILATERAL     ESOPHAGOGASTRODUODENOSCOPY (EGD) WITH PROPOFOL N/A 11/11/2020   Procedure: ESOPHAGOGASTRODUODENOSCOPY (EGD) WITH PROPOFOL;  Surgeon: [name], MD;  Location: [facility];  Service: Gastroenterology;  Laterality: N/A;   EYE SURGERY  2006   FOREIGN BODY REMOVAL  11/11/2020   Procedure: FOREIGN BODY REMOVAL;  Surgeon: [name], MD;  Location: [facility];  Service: Gastroenterology;;   IR PARACENTESIS  05/09/2020   TONSILLECTOMY      I have reviewed the social history and family history with the patient and they are unchanged from previous note.  ALLERGIES:  is allergic to lisinopril.  MEDICATIONS:  Current Outpatient Medications  Medication Sig Dispense Refill   capecitabine (XELODA) 500 MG tablet Take 3 tabs in the morning and evening, 30 minutes after meal,  for 7 days on and 7 days off 84 tablet 2   cetirizine (ZYRTEC) 10 MG tablet Take 10 mg by mouth daily.     EPINEPHrine 0.3 mg/0.3 mL IJ SOAJ injection Inject 0.3 mg into the muscle as needed for anaphylaxis.     ezetimibe (ZETIA) 10 MG tablet Take 1 tablet (10 mg total) by mouth daily. 90 tablet 3   fluticasone (FLONASE) 50 MCG/ACT nasal spray Place 1 spray into both nostrils daily as needed for allergies.     GLUCOSAMINE-CHONDROITIN PO Take 1 capsule by mouth daily.     lactulose (CHRONULAC) 10 GM/15ML solution Take 15 mLs (10 g total) by mouth daily as needed for mild  constipation. 236 mL 0   moxifloxacin (VIGAMOX) 0.5 % ophthalmic solution Place one drop into the left eye 4 (four) times daily. Do not take tonight.  Bring to office tomorrow.     Multiple Vitamins-Minerals (ONE-A-DAY MENS 50+) TABS Take 1 tablet by mouth daily.     Multiple Vitamins-Minerals (PRESERVISION AREDS 2 PO) Take 1 tablet by mouth 2 (two) times daily.     Netarsudil Dimesylate (RHOPRESSA) 0.02 % SOLN Place 1 drop into the left eye daily.     spironolactone (ALDACTONE) 50 MG tablet Take 1 tablet (50 mg total) by mouth daily. 30 tablet 2   sucralfate (CARAFATE) 1 g tablet Take 1 tablet (1 g total) by mouth 4 (four) times daily. Dissolve each tablet in 15 cc water before use. 120 tablet 2   No current facility-administered medications for this visit.    PHYSICAL EXAMINATION: ECOG PERFORMANCE STATUS: 1 - Symptomatic but completely ambulatory  Vitals:   03/17/21 1005  BP: 132/60  Pulse: 74  Resp:  18  Temp: 98 F (36.7 C)  SpO2: 100%   Wt Readings from Last 3 Encounters:  03/17/21 163 lb (73.9 kg)  03/03/21 164 lb 3.2 oz (74.5 kg)  02/27/21 158 lb 3.2 oz (71.8 kg)     GENERAL:alert, no distress and comfortable SKIN: skin color, texture, turgor are normal, no rashes or significant lesions EYES: normal, Conjunctiva are pink and non-injected, sclera clear  NECK: supple, thyroid normal size, non-tender, without nodularity LYMPH:  no palpable lymphadenopathy in the cervical, axillary  LUNGS: clear to auscultation and percussion with normal breathing effort HEART: regular rate & rhythm and no murmurs and no lower extremity edema ABDOMEN:abdomen soft, non-tender and normal bowel sounds Musculoskeletal:no cyanosis of digits and no clubbing  NEURO: alert & oriented x 3 with fluent speech, no focal motor/sensory deficits  LABORATORY DATA:  I have reviewed the data as listed CBC Latest Ref Rng & Units 03/17/2021 03/03/2021 02/17/2021  WBC 4.0 - 10.5 K/uL 4.1 4.6 4.2  Hemoglobin  13.0 - 17.0 g/dL 12.2(L) 11.8(L) 11.8(L)  Hematocrit 39.0 - 52.0 % 37.9(L) 35.9(L) 35.6(L)  Platelets 150 - 400 K/uL 140(L) 132(L) 156     CMP Latest Ref Rng & Units 03/17/2021 03/03/2021 02/17/2021  Glucose 70 - 99 mg/dL 96 79 104(H)  BUN 8 - 23 mg/dL _0 Creatinine 0.61 - 1.24 mg/dL 0.91 0.81 0.87  Sodium 135 - 145 mmol/L 138 141 140  Potassium 3.5 - 5.1 mmol/L 4.1 3.2(L) 3.8  Chloride 98 - 111 mmol/L 109 112(H) 114(H)  CO2 22 - 32 mmol/L 21(L) 19(L) 19(L)  Calcium 8.9 - 10.3 mg/dL 9.1 8.7(L) 9.0  Total Protein 6.5 - 8.1 g/dL 7.2 6.8 6.7  Total Bilirubin 0.3 - 1.2 mg/dL 1.0 0.7 0.7  Alkaline Phos 38 - 126 U/L 75 73 70  AST 15 - 41 U/L _1 ALT 0 - 44 U/L _2 RADIOGRAPHIC STUDIES: I have personally reviewed the radiological images as listed and agreed with the findings in the report. No results found.    No orders of the defined types were placed in this encounter.  All questions were answered. The patient knows to call the clinic with any problems, questions or concerns. No barriers to learning was detected. The total time spent in the appointment was 30 minutes.     [name], MD 03/17/2021   I, Wilburn Mylar, am acting as scribe for [name], MD.   I have reviewed the above documentation for accuracy and completeness, and I agree with the above.

## 2021-03-18 LAB — T4: T4, Total: 10.8 ug/dL (ref 4.5–12.0)

## 2021-03-26 ENCOUNTER — Ambulatory Visit (HOSPITAL_COMMUNITY)
Admission: RE | Admit: 2021-03-26 | Discharge: 2021-03-26 | Disposition: A | Discharge status: Home / Self Care | Payer: Medicare Other | Source: Ambulatory Visit | Attending: Hematology | Admitting: Hematology

## 2021-03-26 ENCOUNTER — Encounter (HOSPITAL_COMMUNITY): Payer: Self-pay

## 2021-03-26 ENCOUNTER — Other Ambulatory Visit: Payer: Self-pay

## 2021-03-26 DIAGNOSIS — C159 Malignant neoplasm of esophagus, unspecified: Secondary | ICD-10-CM | POA: Diagnosis not present

## 2021-03-26 DIAGNOSIS — J90 Pleural effusion, not elsewhere classified: Secondary | ICD-10-CM | POA: Diagnosis not present

## 2021-03-26 DIAGNOSIS — C155 Malignant neoplasm of lower third of esophagus: Secondary | ICD-10-CM | POA: Insufficient documentation

## 2021-03-26 DIAGNOSIS — C787 Secondary malignant neoplasm of liver and intrahepatic bile duct: Secondary | ICD-10-CM | POA: Diagnosis not present

## 2021-03-26 DIAGNOSIS — R918 Other nonspecific abnormal finding of lung field: Secondary | ICD-10-CM | POA: Diagnosis not present

## 2021-03-26 MED ORDER — IOHEXOL 350 MG/ML SOLN
80.0000 mL | Freq: Once | INTRAVENOUS | Status: AC | PRN
  Administered 2021-03-26: 12:00:00 80 mL via INTRAVENOUS

## 2021-03-26 MED ORDER — SODIUM CHLORIDE (PF) 0.9 % IJ SOLN
INTRAMUSCULAR | Status: AC
  Filled 2021-03-26: qty 50

## 2021-03-28 ENCOUNTER — Telehealth: Payer: Self-pay | Admitting: *Deleted

## 2021-03-28 DIAGNOSIS — D539 Nutritional anemia, unspecified: Secondary | ICD-10-CM

## 2021-03-28 NOTE — Telephone Encounter (Signed)
Iron orders placed

## 2021-03-30 NOTE — Progress Notes (Addendum)
[place]   Telephone:([phone]) [phone] Fax:(336) [phone]   Clinic Follow up Note   Patient Care Team: [name], [name], MD as PCP - General (Family Medicine) [name], MD as Consulting Physician (Gastroenterology) [name], MD as Consulting Physician (Hematology) [name], NP as Nurse Practitioner (Nurse Practitioner) [name], RN as Registered Nurse 04/01/2021  CHIEF COMPLAINT: Follow up esophageal cancer   SUMMARY OF ONCOLOGIC HISTORY: Oncology History Overview Note  Cancer Staging No matching staging information was found for the patient.    Primary adenocarcinoma of distal third of esophagus ([place])  11/11/2020 Procedure   EGD by Dr. Watt Climes - Normal larynx. -? Malignant-appearing esophageal stenosis. Biopsied. - Pills were found in the esophagus. Removal was successful. - Rule out malignancy,? gastric tumor in the cardia difficult to say based on bleeding from passing the scope and unable to wash and suction for adequate visualization. - Normal ampulla, duodenal bulb, first portion of the duodenum, second portion of the duodenum, major papilla and area of the papilla. - The examination was otherwise normal.   11/11/2020 Initial Biopsy   FINAL MICROSCOPIC DIAGNOSIS:  A. ESOPHAGUS, DISTAL, BIOPSY:  -  Poorly differentiated adenocarcinoma  -  See comment    11/11/2020 Initial Diagnosis   Primary adenocarcinoma of distal third of esophagus ([place])   11/11/2020 Cancer Staging   Staging form: Esophagus - Adenocarcinoma, AJCC 8th Edition - Clinical stage from 11/11/2020: Stage IVB (cTX, cN1, cM1) - Signed by [name], MD on 12/26/2020    11/14/2020 Imaging   CT CAPIMPRESSION: 1. Aggressive appearing new tumor of the gastroesophageal junction confluent with the irregular collection of right gastric lymph nodes, and with new associated metastatic retroperitoneal adenopathy. Small paraesophageal lymph nodes in the thorax are increased in size from  prior exam and could also be involved, and there is a nonspecific 1.0 cm lesion inferiorly in the right hepatic lobe which could potentially be metastatic. 2. New subacute burst (AO type A4) fracture of L5, with mild posterior bony retropulsion and with a dominant coronally oriented fracture plane, and about 50% loss of height. Correlate with interval trauma. 3. Slight thickening along the left paracolic gutter and trace pelvic ascites, although the ascites is markedly reduced compared to 05/04/2020. 4. Other imaging findings of potential clinical significance: Aortic Atherosclerosis (ICD10-I70.0). Coronary atherosclerosis. Old compression fracture at L2.   12/03/2020 PET scan   IMPRESSION: Tumor at the GE junction with local perigastric infiltration, hepatic and nodal metastases.   Nodal involvement both in the chest and abdomen extending to the lower retroperitoneum in the abdomen.   Burst fracture at L5 shows signs of increased metabolic activity, nonspecific in the setting of fracture, in light of other findings would correlate with any history of trauma, if no history of trauma consider MRI to exclude the possibility of pathologic fracture.   12/27/2020 Genetic Testing   Negative hereditary cancer genetic testing: no pathogenic variants detected in [facility] Multi-Cancer +RNA Panel.  The report date is December 27, 2020.   The Multi-Cancer + RNA Panel offered by [facility] includes sequencing and/or deletion/duplication analysis of the following 84 genes:  AIP*, ALK, APC*, ATM*, AXIN2*, BAP1*, BARD1*, BLM*, BMPR1A*, BRCA1*, BRCA2*, BRIP1*, CASR, CDC73*, CDH1*, CDK4, CDKN1B*, CDKN1C*, CDKN2A, CEBPA, CHEK2*, CTNNA1*, DICER1*, DIS3L2*, EGFR, EPCAM, FH*, FLCN*, GATA2*, GPC3, GREM1, HOXB13, HRAS, KIT, MAX*, MEN1*, MET, MITF, MLH1*, MSH2*, MSH3*, MSH6*, MUTYH*, NBN*, NF1*, NF2*, NTHL1*, PALB2*, PDGFRA, PHOX2B, PMS2*, POLD1*, POLE*, POT1*, PRKAR1A*, PTCH1*, PTEN*, RAD50*, RAD51C*,  RAD51D*, RB1*,  RECQL4, RET, RUNX1*, SDHA*, SDHAF2*, SDHB*, SDHC*, SDHD*, SMAD4*, SMARCA4*, SMARCB1*, SMARCE1*, STK11*, SUFU*, TERC, TERT, TMEM127*, Tp53*, TSC1*, TSC2*, VHL*, WRN*, and WT1.  RNA analysis is performed for * genes.   01/07/2021 -  Chemotherapy   Patient is on Treatment Plan : GASTROESOPHAGEAL Nivolumab q14d x 8 cycles / Nivolumab q28d     03/26/2021 Imaging   CT CAP IMPRESSION: 1. Evidence of treatment response. There is decreased size of the irregular infiltrative mass centered at the gastroesophageal junction as well as decreased size of numerous metastatic retroperitoneal and upper abdominal lymph nodes. 2. 2.1 cm hepatic metastasis at the inferior right hepatic lobe segment 6 is stable to slightly increased in size since previous PET-CT. No new hepatic mass visualized. 3. A few new adjacent small pulmonary nodules in the posterior left lower lobe measuring up to 4 mm in size. Continued follow-up recommended. 4. Small right pleural effusion which is new since previous study. Small pericardial effusion increased since previous study. 5. Other ancillary findings as described.       CURRENT THERAPY:  Xeloda 1500 mg q12h 1 week on/1 week off, starting 01/06/21 Nivolumab q2 weeks, starting 01/07/21. Change to q4 weeks starting 04/01/21  INTERVAL HISTORY: Randy Wood returns for follow up and treatment as scheduled. Last seen by Dr. Burr Medico 03/17/21 and completed another cycle of Nivo q2 weeks.  He began the current Xeloda cycle 03/31/2021 taking 3 tabs BID for 1 week on then 1 week off.  He is tolerating well with no significant issues.  He is eating what ever he wants for the most part, no significant issues swallowing meat/bread or anything except pills.  He has to dissolve Xeloda.  He is using Flonase for chronic maxillary sinus issues, he does have some drainage.  Denies cough, chest pain, dyspnea, fever or chills.  He has numbness without tingling in the fingertips, Iphone "does not always respond"  to his touch.  He can still function normally, this is stable.  Denies mucositis, hand-foot syndrome, N/V/C/D, new or worsening pain, or any other concerns.   MEDICAL HISTORY:  Past Medical History:  Diagnosis Date   AKI (acute kidney injury) ([place])    Alcohol abuse    Arthritis    Cataract 2006   Diarrhea    Edema leg    Esophageal cancer ([place])    Family history of breast cancer 12/09/2020   Family history of ovarian cancer 12/09/2020   Family history of prostate cancer 12/09/2020   GERD (gastroesophageal reflux disease)    Glaucoma unknown   Hyperbilirubinemia    Hyperlipemia    Hypertension    Hypokalemia    Liver failure ([facility])    Macular degeneration, wet ([facility])    Microcytic anemia    Osteoarthritis    SOB (shortness of breath)    Weight gain     SURGICAL HISTORY: Past Surgical History:  Procedure Laterality Date   ANKLE SURGERY     BIOPSY  11/11/2020   Procedure: BIOPSY;  Surgeon: [name], MD;  Location: [facility];  Service: Gastroenterology;;   CATARACT EXTRACTION, BILATERAL     ESOPHAGOGASTRODUODENOSCOPY (EGD) WITH PROPOFOL N/A 11/11/2020   Procedure: ESOPHAGOGASTRODUODENOSCOPY (EGD) WITH PROPOFOL;  Surgeon: [name], MD;  Location: [facility];  Service: Gastroenterology;  Laterality: N/A;   EYE SURGERY  2006   FOREIGN BODY REMOVAL  11/11/2020   Procedure: FOREIGN BODY REMOVAL;  Surgeon: [name], MD;  Location: [facility];  Service: Gastroenterology;;   IR PARACENTESIS  05/09/2020  ° TONSILLECTOMY    ° ° °I have reviewed the social history and family history with the patient and they are unchanged from previous note. ° °ALLERGIES:  is allergic to lisinopril. ° °MEDICATIONS:  °Current Outpatient Medications  °Medication Sig Dispense Refill  ° capecitabine (XELODA) 500 MG tablet Take 3 tabs in the morning and evening, 30 minutes after meal,  for 7 days on and 7 days off 84 tablet 2  ° cetirizine (ZYRTEC) 10 MG tablet Take 10 mg by mouth daily.    °  EPINEPHrine 0.3 mg/0.3 mL IJ SOAJ injection Inject 0.3 mg into the muscle as needed for anaphylaxis.    ° ezetimibe (ZETIA) 10 MG tablet Take 1 tablet (10 mg total) by mouth daily. 90 tablet 3  ° fluticasone (FLONASE) 50 MCG/ACT nasal spray Place 1 spray into both nostrils daily as needed for allergies.    ° GLUCOSAMINE-CHONDROITIN PO Take 1 capsule by mouth daily.    ° lactulose (CHRONULAC) 10 GM/15ML solution Take 15 mLs (10 g total) by mouth daily as needed for mild constipation. 236 mL 0  ° moxifloxacin (VIGAMOX) 0.5 % ophthalmic solution Place one drop into the left eye 4 (four) times daily. Do not take tonight.  Bring to office tomorrow.    ° Multiple Vitamins-Minerals (ONE-A-DAY MENS 50+) TABS Take 1 tablet by mouth daily.    ° Multiple Vitamins-Minerals (PRESERVISION AREDS 2 PO) Take 1 tablet by mouth 2 (two) times daily.    ° Netarsudil Dimesylate (RHOPRESSA) 0.02 % SOLN Place 1 drop into the left eye daily.    ° spironolactone (ALDACTONE) 50 MG tablet Take 1 tablet (50 mg total) by mouth daily. 30 tablet 2  ° sucralfate (CARAFATE) 1 g tablet Take 1 tablet (1 g total) by mouth 4 (four) times daily. Dissolve each tablet in 15 cc water before use. 120 tablet 2  ° °No current facility-administered medications for this visit.  ° °Facility-Administered Medications Ordered in Other Visits  °Medication Dose Route Frequency Provider Last Rate Last Admin  ° nivolumab (OPDIVO) 480 mg in sodium chloride 0.9 % 100 mL chemo infusion  480 mg Intravenous Once Wood, Yan, MD      ° ° °PHYSICAL EXAMINATION: °ECOG PERFORMANCE STATUS: 1 - Symptomatic but completely ambulatory ° °Vitals:  ° 04/01/21 1041  °BP: 129/63  °Pulse: 85  °Resp: 17  °Temp: 97.9 °F (36.6 °C)  °SpO2: 100%  ° °Filed Weights  ° 04/01/21 1041  °Weight: 167 lb 9.6 oz (76 kg)  ° ° °GENERAL:alert, no distress and comfortable °SKIN: Without rash °EYES: sclera clear °NECK: Without mass °LYMPH:  no palpable cervical or supraclavicular lymphadenopathy °LUNGS: clear  with normal breathing effort °HEART: regular rate & rhythm, no lower extremity edema °ABDOMEN:abdomen soft, non-tender and normal bowel sounds °NEURO: alert & oriented x 3 with fluent speech, no focal motor deficits ° °LABORATORY DATA:  °I have reviewed the data as listed °CBC Latest Ref Rng & Units 04/01/2021 03/17/2021 03/03/2021  °WBC 4.0 - 10.5 K/uL 3.7(L) 4.1 4.6  °Hemoglobin 13.0 - 17.0 g/dL 11.3(L) 12.2(L) 11.8(L)  °Hematocrit 39.0 - 52.0 % 34.3(L) 37.9(L) 35.9(L)  °Platelets 150 - 400 K/uL 120(L) 140(L) 132(L)  ° ° ° °CMP Latest Ref Rng & Units 04/01/2021 03/17/2021 03/03/2021  °Glucose 70 - 99 mg/dL 166(H) 96 79  °BUN 8 - 23 mg/dL 16 13 14  °Creatinine 0.61 - 1.24 mg/dL 0.92 0.91 0.81  °Sodium 135 - 145 mmol/L 136 138 141  °Potassium 3.5 -   5.1 mmol/L 3.8 4.1 3.2(L)  °Chloride 98 - 111 mmol/L 109 109 112(H)  °CO2 22 - 32 mmol/L 21(L) 21(L) 19(L)  °Calcium 8.9 - 10.3 mg/dL 8.9 9.1 8.7(L)  °Total Protein 6.5 - 8.1 g/dL 6.4(L) 7.2 6.8  °Total Bilirubin 0.3 - 1.2 mg/dL 0.7 1.0 0.7  °Alkaline Phos 38 - 126 U/L 66 75 73  °AST 15 - 41 U/L 19 21 22  °ALT 0 - 44 U/L 9 9 10  ° ° ° ° °RADIOGRAPHIC STUDIES: °I have personally reviewed the radiological images as listed and agreed with the findings in the report. °No results found.  ° °ASSESSMENT & PLAN: 80-year-old male °  °1.Locally advanced adenocarcinoma of the distal esophagus/GE junction, with liver and nodal metastasis, HER2 negative, MMR normal, PD-L1 3% °-He presented with progressive dysphagia and weight loss, work-up showed moderate stenosis in the distal third of the esophagus invading the gastric cardia, path 11/25/2020 confirmed poorly differentiated adenocarcinoma.  °-PET 12/03/2020 showed esophageal tumor with local perigastric infiltration, hepatic and nodal metastasis involving the chest and abdomen extending to lower retroperitoneum.  A burst fracture at L5 with increased metabolic activity °-Molecular testing showed MMR normal, PD-L1 CPS 3% °-He received  palliative radiation to the primary tumor 9/15 - 12/27/2020 °-He began single agent Xeloda 1 week on/1 wee off and nivolumab every 2 weeks on 01/07/2021, tolerating well °-Dysphagia has resolved, except pills, while on treatment, c/w clinical response °  °2.  Dysphagia, odynophagia, and weight loss °-Onset early 2022 after hospitalization for bacterial peritonitis, 40 lbs in 6 months  °-in past few weeks he lost 15 lbs due to severe dysphagia and odynophagia °-EGD showed numerous pills in middle/distal esophagus which were removed. °-Encouraged to increase nutrition supplements such as boost/Ensure, puréed food, high caloric intake, and soft diet.   °-Weight stable overall, continue follow-up with dietitian °-Dysphagia nearly resolved on treatment, indicating clinical response to therapy °  °3.  Cirrhosis, abdominal pain °-felt to be alcohol-related. He has abstained from alcohol since 04/2020 °-S/p paracentesis 05/09/2020 yielding 4.4 liters, path showed reactive mesothelial cells °-Compensated, hepatic function panel 09/17/2020 WNL °-Continue spironolactone.  Hold Lasix °-followed by GI °  °4.Social support °-He is widowed and lives alone, his wife died from GBM in 2017, she was treated here °-2 daughters live out of town, Randy Wood lives in [place] and is the closest, also his primary contact for healthcare °-He has church community and some family friends who could help if needed °-He has met outpatient palliative care °-Patient is independent with ADLs but has a housekeeper and yard service.  °-Previously referred to social work °  °5.Family history  °-Patient's mother had ovarian cancer, father had prostate cancer, and a daughter has breast cancer at age 54 °-His daughter Randy Wood without cancer underwent genetic testing and is negative °-He was referred to genetics and underwent testing 12/09/2020, results were negative ° °Disposition: °Mr. Gratz appears stable.  He continues every 2 week nivolumab and Xeloda 1500  mg twice daily for 1 week on/1 week off, started the current cycle 03/31/2021.  He tolerates treatment very well overall without significant side effects.  He is able to recover and function well.  Dysphagia has nearly resolved on treatment, except pills, consistent with a clinical response to therapy. ° °Labs reviewed, adequate to proceed with treatment today as planned. We reviewed his restaging CT CAP which shows the primary esophageal tumor and locoregional adenopathy has decreased in size.  Right hepatic liver lesion is stable   from PET in 11/2020 but slightly increased since 10/2020 CT, although he was not on therapy then.  He does have new lung nodules up to 4 mm, nonspecific and will be monitored.  Imaging overall is consistent with a treatment response, but difficult to tell if there is progression in the liver given the gap from imaging (aug/sept 2022) and starting treatment in 12/2020.   ° °Patient was seen with Dr. Feng (Virtually), we recommend to continue Xeloda 1500 mg twice daily 1 week on/1 week off and change nivolumab to every 4 weeks.  We reviewed potential toxicities including but not limited to increased fatigue.  We plan to restage in 3 months for direct comparison. ° °He will return for lab, follow-up, and Nivo in 4 weeks, continue Xeloda 1500 mg BID 1 week on/1 week off in the interim. ° °All questions were answered. The patient knows to call the clinic with any problems, questions or concerns. No barriers to learning were detected. ° ° °  ° Randy K Burton, NP °04/01/21  ° ° Addendum ° °I have seen the patient, examined him. I agree with the assessment and and plan and have edited the notes.  ° °I personally reviewed his restaging CT scan images.  Improved the primary site and RP adenopathy are likely related to radiation and systemic treatment, the interval increase in size of the liver metastasis could be timing related, he did not have a repeated CT scan before he started nivolumab and Xeloda  on 01/07/2021 for accurate comparison.  He is clinically doing well, tolerating treatment very well, will continue current therapy, with a repeated CT scan in 3 months for restaging.  All questions were answered.  Plan to change nivolumab to 480 mg every 4 weeks.  ° °Randy Wood  °04/01/2021  °

## 2021-03-31 ENCOUNTER — Other Ambulatory Visit: Payer: Self-pay

## 2021-03-31 ENCOUNTER — Encounter (INDEPENDENT_AMBULATORY_CARE_PROVIDER_SITE_OTHER): Payer: Medicare Other | Admitting: Ophthalmology

## 2021-03-31 DIAGNOSIS — I10 Essential (primary) hypertension: Secondary | ICD-10-CM | POA: Diagnosis not present

## 2021-03-31 DIAGNOSIS — H353122 Nonexudative age-related macular degeneration, left eye, intermediate dry stage: Secondary | ICD-10-CM | POA: Diagnosis not present

## 2021-03-31 DIAGNOSIS — H43813 Vitreous degeneration, bilateral: Secondary | ICD-10-CM | POA: Diagnosis not present

## 2021-03-31 DIAGNOSIS — H353211 Exudative age-related macular degeneration, right eye, with active choroidal neovascularization: Secondary | ICD-10-CM

## 2021-03-31 DIAGNOSIS — H35033 Hypertensive retinopathy, bilateral: Secondary | ICD-10-CM | POA: Diagnosis not present

## 2021-04-01 ENCOUNTER — Inpatient Hospital Stay: Payer: Medicare Other | Attending: Nurse Practitioner

## 2021-04-01 ENCOUNTER — Other Ambulatory Visit: Payer: Self-pay

## 2021-04-01 ENCOUNTER — Encounter: Payer: Self-pay | Admitting: Nurse Practitioner

## 2021-04-01 ENCOUNTER — Inpatient Hospital Stay: Payer: Medicare Other

## 2021-04-01 ENCOUNTER — Other Ambulatory Visit: Payer: Medicare Other

## 2021-04-01 ENCOUNTER — Inpatient Hospital Stay (HOSPITAL_BASED_OUTPATIENT_CLINIC_OR_DEPARTMENT_OTHER): Payer: Medicare Other | Admitting: Nurse Practitioner

## 2021-04-01 ENCOUNTER — Ambulatory Visit: Payer: Medicare Other | Admitting: Nurse Practitioner

## 2021-04-01 VITALS — BP 129/63 | HR 85 | Temp 97.9°F | Resp 17 | Ht 71.0 in | Wt 167.6 lb

## 2021-04-01 DIAGNOSIS — K7460 Unspecified cirrhosis of liver: Secondary | ICD-10-CM | POA: Insufficient documentation

## 2021-04-01 DIAGNOSIS — C155 Malignant neoplasm of lower third of esophagus: Secondary | ICD-10-CM | POA: Insufficient documentation

## 2021-04-01 DIAGNOSIS — Z923 Personal history of irradiation: Secondary | ICD-10-CM | POA: Insufficient documentation

## 2021-04-01 DIAGNOSIS — Z79899 Other long term (current) drug therapy: Secondary | ICD-10-CM | POA: Diagnosis not present

## 2021-04-01 DIAGNOSIS — C787 Secondary malignant neoplasm of liver and intrahepatic bile duct: Secondary | ICD-10-CM | POA: Insufficient documentation

## 2021-04-01 DIAGNOSIS — I10 Essential (primary) hypertension: Secondary | ICD-10-CM

## 2021-04-01 DIAGNOSIS — D539 Nutritional anemia, unspecified: Secondary | ICD-10-CM

## 2021-04-01 DIAGNOSIS — C779 Secondary and unspecified malignant neoplasm of lymph node, unspecified: Secondary | ICD-10-CM | POA: Insufficient documentation

## 2021-04-01 DIAGNOSIS — Z5111 Encounter for antineoplastic chemotherapy: Secondary | ICD-10-CM | POA: Diagnosis not present

## 2021-04-01 LAB — CBC WITH DIFFERENTIAL (CANCER CENTER ONLY)
Abs Immature Granulocytes: 0.01 10*3/uL (ref 0.00–0.07)
Basophils Absolute: 0 10*3/uL (ref 0.0–0.1)
Basophils Relative: 1 %
Eosinophils Absolute: 0.1 10*3/uL (ref 0.0–0.5)
Eosinophils Relative: 2 %
HCT: 34.3 % — ABNORMAL LOW (ref 39.0–52.0)
Hemoglobin: 11.3 g/dL — ABNORMAL LOW (ref 13.0–17.0)
Immature Granulocytes: 0 %
Lymphocytes Relative: 18 %
Lymphs Abs: 0.7 10*3/uL (ref 0.7–4.0)
MCH: 33.3 pg (ref 26.0–34.0)
MCHC: 32.9 g/dL (ref 30.0–36.0)
MCV: 101.2 fL — ABNORMAL HIGH (ref 80.0–100.0)
Monocytes Absolute: 0.3 10*3/uL (ref 0.1–1.0)
Monocytes Relative: 8 %
Neutro Abs: 2.6 10*3/uL (ref 1.7–7.7)
Neutrophils Relative %: 71 %
Platelet Count: 120 10*3/uL — ABNORMAL LOW (ref 150–400)
RBC: 3.39 MIL/uL — ABNORMAL LOW (ref 4.22–5.81)
RDW: 17.9 % — ABNORMAL HIGH (ref 11.5–15.5)
WBC Count: 3.7 10*3/uL — ABNORMAL LOW (ref 4.0–10.5)
nRBC: 0 % (ref 0.0–0.2)

## 2021-04-01 LAB — CMP (CANCER CENTER ONLY)
ALT: 9 U/L (ref 0–44)
AST: 19 U/L (ref 15–41)
Albumin: 3.5 g/dL (ref 3.5–5.0)
Alkaline Phosphatase: 66 U/L (ref 38–126)
Anion gap: 6 (ref 5–15)
BUN: 16 mg/dL (ref 8–23)
CO2: 21 mmol/L — ABNORMAL LOW (ref 22–32)
Calcium: 8.9 mg/dL (ref 8.9–10.3)
Chloride: 109 mmol/L (ref 98–111)
Creatinine: 0.92 mg/dL (ref 0.61–1.24)
GFR, Estimated: >60 mL/min (ref >60)
Glucose, Bld: 166 mg/dL — ABNORMAL HIGH (ref 70–99)
Potassium: 3.8 mmol/L (ref 3.5–5.1)
Sodium: 136 mmol/L (ref 135–145)
Total Bilirubin: 0.7 mg/dL (ref 0.3–1.2)
Total Protein: 6.4 g/dL — ABNORMAL LOW (ref 6.5–8.1)

## 2021-04-01 LAB — LIPID PANEL
Cholesterol: 145 mg/dL (ref 0–200)
HDL: 54 mg/dL (ref >40)
LDL Cholesterol: 78 mg/dL (ref 0–99)
Total CHOL/HDL Ratio: 2.7 RATIO
Triglycerides: 64 mg/dL (ref <150)
VLDL: 13 mg/dL (ref 0–40)

## 2021-04-01 LAB — TSH: TSH: 2 u[IU]/mL (ref 0.320–4.118)

## 2021-04-01 LAB — IRON AND IRON BINDING CAPACITY (CC-WL,HP ONLY)
Iron: 97 ug/dL (ref 45–182)
Saturation Ratios: 22 % (ref 17.9–39.5)
TIBC: 448 ug/dL (ref 250–450)
UIBC: 351 ug/dL (ref 117–376)

## 2021-04-01 MED ORDER — SODIUM CHLORIDE 0.9 % IV SOLN
480.0000 mg | Freq: Once | INTRAVENOUS | Status: AC
  Administered 2021-04-01: 480 mg via INTRAVENOUS
  Filled 2021-04-01: qty 48

## 2021-04-01 MED ORDER — SODIUM CHLORIDE 0.9 % IV SOLN: Freq: Once | INTRAVENOUS | Status: AC

## 2021-04-01 NOTE — Patient Instructions (Signed)
[facility]ICAL [facility]COL[facility]  Discharge Instructions: °Thank you for choosing [facility] to provide your oncology and hematology care.  ° °If you have a lab appointment with the Cancer Center, please go directly to the Cancer Center and check in at the registration area. °  °Wear comfortable clothing and clothing appropriate for easy access to any Portacath or PICC line.  ° °We strive to give you quality time with your provider. You may need to reschedule your appointment if you arrive late (15 or more minutes).  Arriving late affects you and other patients whose appointments are after yours.  Also, if you miss three or more appointments without notifying the office, you may be dismissed from the clinic at the provider’s discretion.    °  °For prescription refill requests, have your pharmacy contact our office and allow 72 hours for refills to be completed.   ° °Today you received the following chemotherapy and/or immunotherapy agent: Nivolumab (Opdivo) °  °To help prevent nausea and vomiting after your treatment, we encourage you to take your nausea medication as directed. ° °BELOW ARE SYMPTOMS THAT SHOULD BE REPORTED IMMEDIATELY: °*FEVER GREATER THAN 100.4 F (38 °C) OR HIGHER °*CHILLS OR SWEATING °*NAUSEA AND VOMITING THAT IS NOT CONTROLLED WITH YOUR NAUSEA MEDICATION °*UNUSUAL SHORTNESS OF BREATH °*UNUSUAL BRUISING OR BLEEDING °*URINARY PROBLEMS (pain or burning when urinating, or frequent urination) °*BOWEL PROBLEMS (unusual diarrhea, constipation, pain near the anus) °TENDERNESS IN MOUTH AND THROAT WITH OR WITHOUT PRESENCE OF ULCERS (sore throat, sores in mouth, or a toothache) °UNUSUAL RASH, SWELLING OR PAIN  °UNUSUAL VAGINAL DISCHARGE OR ITCHING  ° °Items with * indicate a potential emergency and should be followed up as soon as possible or go to the Emergency Department if any problems should occur. ° °Please show the CHEMOTHERAPY ALERT CARD or IMMUNOTHERAPY ALERT CARD at  check-in to the Emergency Department and triage nurse. ° °Should you have questions after your visit or need to cancel or reschedule your appointment, please contact [facility]  Dept: [phone]  and follow the prompts.  Office hours are 8:00 a.m. to 4:30 p.m. Monday - Friday. Please note that voicemails left after 4:00 p.m. may not be returned until the following business day.  We are closed weekends and major holidays. You have access to a nurse at all times for urgent questions. Please call the main number to the clinic Dept: [phone] and follow the prompts. ° ° °For any non-urgent questions, you may also contact your provider using MyChart. We now offer e-Visits for anyone 18 and older to request care online for non-urgent symptoms. For details visit [email or web address]. °  °Also download the MyChart app! Go to the app store, search "MyChart", open the app, select [facility], and log in with your MyChart username and password. ° °Due to Covid, a mask is required upon entering the hospital/clinic. If you do not have a mask, one will be given to you upon arrival. For doctor visits, patients may have 1 support person aged 18 or older with them. For treatment visits, patients cannot have anyone with them due to current Covid guidelines and our immunocompromised population.  ° °

## 2021-04-02 ENCOUNTER — Other Ambulatory Visit: Payer: Self-pay

## 2021-04-02 ENCOUNTER — Ambulatory Visit (HOSPITAL_COMMUNITY): Payer: Medicare Other | Attending: Cardiology

## 2021-04-02 ENCOUNTER — Telehealth: Payer: Self-pay | Admitting: Cardiology

## 2021-04-02 DIAGNOSIS — E7800 Pure hypercholesterolemia, unspecified: Secondary | ICD-10-CM | POA: Insufficient documentation

## 2021-04-02 DIAGNOSIS — Z9221 Personal history of antineoplastic chemotherapy: Secondary | ICD-10-CM | POA: Diagnosis not present

## 2021-04-02 DIAGNOSIS — I10 Essential (primary) hypertension: Secondary | ICD-10-CM | POA: Diagnosis not present

## 2021-04-02 LAB — T4: T4, Total: 10.8 ug/dL (ref 4.5–12.0)

## 2021-04-02 LAB — ECHOCARDIOGRAM LIMITED
Area-P 1/2: 3.44 cm2
S' Lateral: 2.25 cm

## 2021-04-02 NOTE — Telephone Encounter (Signed)
Dr. Johney Frame, we saw this pt on 12/1 and we advised him at that time that when he reports to his next Oncology appt, to have them draw lipids on him at that time.   Oncology called this morning to report labs were completed on the pt yesterday and in epic to review. They also report that the pt did not fast as we advised him to.  They report he had cereal 3 hrs prior to labs being drawn, and results may not be accurate. Please review results and advise.

## 2021-04-02 NOTE — Telephone Encounter (Signed)
Tammi Sou calling on behalf of Randy Niemann NP from [facility] calling to inform the patient had labs done yesterday. She states the patient did not fast and had cereal 3 hours prior so the results may not be accurate. She states they do not need a call back.

## 2021-04-02 NOTE — Telephone Encounter (Signed)
His cholesterol overall looks pretty well controlled on the zetia 10mg  daily with LDL 78. We do not need to change his medications at this time.

## 2021-04-03 NOTE — Telephone Encounter (Signed)
The patient has been notified of the result and verbalized understanding.  All questions (if any) were answered. Nuala Alpha, LPN 06/06/5318 2:33 AM

## 2021-04-07 ENCOUNTER — Other Ambulatory Visit: Payer: Self-pay | Admitting: Hematology

## 2021-04-07 MED ORDER — CAPECITABINE 500 MG PO TABS: ORAL_TABLET | ORAL | 2 refills | Status: DC

## 2021-04-16 ENCOUNTER — Encounter (HOSPITAL_BASED_OUTPATIENT_CLINIC_OR_DEPARTMENT_OTHER): Payer: Self-pay | Admitting: Family Medicine

## 2021-04-16 ENCOUNTER — Other Ambulatory Visit: Payer: Self-pay

## 2021-04-16 ENCOUNTER — Ambulatory Visit (INDEPENDENT_AMBULATORY_CARE_PROVIDER_SITE_OTHER): Payer: Medicare Other | Admitting: Family Medicine

## 2021-04-16 VITALS — BP 110/50 | HR 79 | Ht 71.0 in | Wt 171.0 lb

## 2021-04-16 DIAGNOSIS — J3089 Other allergic rhinitis: Secondary | ICD-10-CM

## 2021-04-16 MED ORDER — AZELASTINE HCL 0.1 % NA SOLN: 2.0000 | Freq: Two times a day (BID) | NASAL | 1 refills | Status: DC

## 2021-04-16 NOTE — Patient Instructions (Signed)
°  Medication Instructions:  Your physician recommends that you continue on your current medications as directed. Please refer to the Current Medication list given to you today. --If you need a refill on any your medications before your next appointment, please call your pharmacy first. If no refills are authorized on file call the office.-- Follow-Up: Your next appointment:   Your physician recommends that you schedule a follow-up appointment in: 3-4 MONTHS with Dr. [name]  You will receive a text message or e-mail with a link to a survey about your care and experience with [place] today! We would greatly appreciate your feedback!   Thanks for letting us be apart of your health journey!!  Primary Care and Sports Medicine   Dr. Arlina Robes [place]   We encourage you to activate your patient portal called "MyChart".  Sign up information is provided on this After Visit Summary.  MyChart is used to connect with patients for Virtual Visits (Telemedicine).  Patients are able to view lab/test results, encounter notes, upcoming appointments, etc.  Non-urgent messages can be sent to your provider as well. To learn more about what you can do with MyChart, please visit --  [email or web address].

## 2021-04-16 NOTE — Assessment & Plan Note (Signed)
Generally has been doing well, continues to have some rhinorrhea Has been taking Zyrtec, finds this to be helpful Was also doing Flonase, however due to ongoing ocular issues, was advised by his eye surgeon to discontinue intranasal steroid spray and switch to alternative medication Discussed options with patient, will continue with daily Zyrtec, will also treat with azelastine nasal spray in place of Flonase.  Can also start nasal saline spray 2-3 times daily to try to help with symptom relief If symptoms persist despite these changes, consider referral to ENT for further evaluation and recommendations

## 2021-04-16 NOTE — Progress Notes (Signed)
° ° °  Procedures performed today:    None.  Independent interpretation of notes and tests performed by another provider:   None.  Brief History, Exam, Impression, and Recommendations:    BP (!) 110/50    Pulse 79    Ht 5\' 11"  (1.803 m)    Wt 171 lb (77.6 kg)    SpO2 99%    BMI 23.85 kg/m   Perennial allergic rhinitis Generally has been doing well, continues to have some rhinorrhea Has been taking Zyrtec, finds this to be helpful Was also doing Flonase, however due to ongoing ocular issues, was advised by his eye surgeon to discontinue intranasal steroid spray and switch to alternative medication Discussed options with patient, will continue with daily Zyrtec, will also treat with azelastine nasal spray in place of Flonase.  Can also start nasal saline spray 2-3 times daily to try to help with symptom relief If symptoms persist despite these changes, consider referral to ENT for further evaluation and recommendations  Plan for follow-up in about 3 to 4 months or sooner as needed   ___________________________________________ Harman Ferrin [name], MD, ABFM, CAQSM Primary Care and [place]

## 2021-04-18 ENCOUNTER — Other Ambulatory Visit: Payer: Self-pay

## 2021-04-18 MED ORDER — EZETIMIBE 10 MG PO TABS: 10.0000 mg | ORAL_TABLET | Freq: Every day | ORAL | 3 refills | Status: DC

## 2021-05-05 ENCOUNTER — Other Ambulatory Visit: Payer: Self-pay

## 2021-05-05 ENCOUNTER — Encounter (INDEPENDENT_AMBULATORY_CARE_PROVIDER_SITE_OTHER): Payer: Medicare Other | Admitting: Ophthalmology

## 2021-05-05 DIAGNOSIS — H35033 Hypertensive retinopathy, bilateral: Secondary | ICD-10-CM

## 2021-05-05 DIAGNOSIS — H353211 Exudative age-related macular degeneration, right eye, with active choroidal neovascularization: Secondary | ICD-10-CM

## 2021-05-05 DIAGNOSIS — H353122 Nonexudative age-related macular degeneration, left eye, intermediate dry stage: Secondary | ICD-10-CM

## 2021-05-05 DIAGNOSIS — H43813 Vitreous degeneration, bilateral: Secondary | ICD-10-CM | POA: Diagnosis not present

## 2021-05-05 DIAGNOSIS — I10 Essential (primary) hypertension: Secondary | ICD-10-CM | POA: Diagnosis not present

## 2021-05-07 ENCOUNTER — Other Ambulatory Visit: Payer: Self-pay

## 2021-05-07 ENCOUNTER — Inpatient Hospital Stay: Payer: Medicare Other | Attending: Nurse Practitioner

## 2021-05-07 ENCOUNTER — Inpatient Hospital Stay: Payer: Medicare Other

## 2021-05-07 ENCOUNTER — Encounter: Payer: Self-pay | Admitting: Hematology

## 2021-05-07 ENCOUNTER — Inpatient Hospital Stay (HOSPITAL_BASED_OUTPATIENT_CLINIC_OR_DEPARTMENT_OTHER): Payer: Medicare Other | Admitting: Hematology

## 2021-05-07 VITALS — BP 130/65 | HR 76 | Temp 97.5°F | Resp 19 | Ht 71.0 in | Wt 168.7 lb

## 2021-05-07 DIAGNOSIS — C787 Secondary malignant neoplasm of liver and intrahepatic bile duct: Secondary | ICD-10-CM | POA: Diagnosis not present

## 2021-05-07 DIAGNOSIS — C155 Malignant neoplasm of lower third of esophagus: Secondary | ICD-10-CM | POA: Insufficient documentation

## 2021-05-07 DIAGNOSIS — Z923 Personal history of irradiation: Secondary | ICD-10-CM | POA: Insufficient documentation

## 2021-05-07 DIAGNOSIS — K7460 Unspecified cirrhosis of liver: Secondary | ICD-10-CM | POA: Diagnosis not present

## 2021-05-07 DIAGNOSIS — Z5111 Encounter for antineoplastic chemotherapy: Secondary | ICD-10-CM | POA: Diagnosis not present

## 2021-05-07 DIAGNOSIS — Z79899 Other long term (current) drug therapy: Secondary | ICD-10-CM | POA: Insufficient documentation

## 2021-05-07 LAB — COMPREHENSIVE METABOLIC PANEL
ALT: 20 U/L (ref 0–44)
AST: 40 U/L (ref 15–41)
Albumin: 3.8 g/dL (ref 3.5–5.0)
Alkaline Phosphatase: 93 U/L (ref 38–126)
Anion gap: 7 (ref 5–15)
BUN: 18 mg/dL (ref 8–23)
CO2: 23 mmol/L (ref 22–32)
Calcium: 9.3 mg/dL (ref 8.9–10.3)
Chloride: 108 mmol/L (ref 98–111)
Creatinine, Ser: 0.86 mg/dL (ref 0.61–1.24)
GFR, Estimated: >60 mL/min (ref >60)
Glucose, Bld: 150 mg/dL — ABNORMAL HIGH (ref 70–99)
Potassium: 4.1 mmol/L (ref 3.5–5.1)
Sodium: 138 mmol/L (ref 135–145)
Total Bilirubin: 0.9 mg/dL (ref 0.3–1.2)
Total Protein: 6.8 g/dL (ref 6.5–8.1)

## 2021-05-07 LAB — CBC WITH DIFFERENTIAL/PLATELET
Abs Immature Granulocytes: 0.01 10*3/uL (ref 0.00–0.07)
Basophils Absolute: 0 10*3/uL (ref 0.0–0.1)
Basophils Relative: 1 %
Eosinophils Absolute: 0.1 10*3/uL (ref 0.0–0.5)
Eosinophils Relative: 3 %
HCT: 33.9 % — ABNORMAL LOW (ref 39.0–52.0)
Hemoglobin: 11.4 g/dL — ABNORMAL LOW (ref 13.0–17.0)
Immature Granulocytes: 0 %
Lymphocytes Relative: 21 %
Lymphs Abs: 0.9 10*3/uL (ref 0.7–4.0)
MCH: 34.4 pg — ABNORMAL HIGH (ref 26.0–34.0)
MCHC: 33.6 g/dL (ref 30.0–36.0)
MCV: 102.4 fL — ABNORMAL HIGH (ref 80.0–100.0)
Monocytes Absolute: 0.5 10*3/uL (ref 0.1–1.0)
Monocytes Relative: 12 %
Neutro Abs: 2.6 10*3/uL (ref 1.7–7.7)
Neutrophils Relative %: 63 %
Platelets: 139 10*3/uL — ABNORMAL LOW (ref 150–400)
RBC: 3.31 MIL/uL — ABNORMAL LOW (ref 4.22–5.81)
RDW: 17.3 % — ABNORMAL HIGH (ref 11.5–15.5)
WBC: 4 10*3/uL (ref 4.0–10.5)
nRBC: 0 % (ref 0.0–0.2)

## 2021-05-07 LAB — IRON AND IRON BINDING CAPACITY (CC-WL,HP ONLY)
Iron: 59 ug/dL (ref 45–182)
Saturation Ratios: 14 % — ABNORMAL LOW (ref 17.9–39.5)
TIBC: 426 ug/dL (ref 250–450)
UIBC: 367 ug/dL (ref 117–376)

## 2021-05-07 LAB — TSH: TSH: <0.08 u[IU]/mL — ABNORMAL LOW (ref 0.320–4.118)

## 2021-05-07 LAB — FERRITIN: Ferritin: 48 ng/mL (ref 24–336)

## 2021-05-07 MED ORDER — SODIUM CHLORIDE 0.9 % IV SOLN
480.0000 mg | Freq: Once | INTRAVENOUS | Status: AC
  Administered 2021-05-07: 480 mg via INTRAVENOUS
  Filled 2021-05-07: qty 48

## 2021-05-07 MED ORDER — SODIUM CHLORIDE 0.9 % IV SOLN: Freq: Once | INTRAVENOUS | Status: AC

## 2021-05-07 NOTE — Progress Notes (Signed)
[place]   [phone] [phone]   Clinic Follow up Note   Patient Care Team: [name], [name], MD as PCP - General (Family Medicine) [name], MD as Consulting Physician (Gastroenterology) [name], MD as Consulting Physician (Hematology) [name], NP as Nurse Practitioner (Nurse Practitioner) [name], RN as Registered Nurse  Date of Service:  05/07/2021  CHIEF COMPLAINT: f/u of esophageal cancer  CURRENT THERAPY:  Xeloda 1500 mg q12h 1 week on/1 week off, starting 01/06/21 Nivolumab q2 weeks, starting 01/07/21. Change to q4 weeks starting 04/01/21  ASSESSMENT & PLAN:  Randy Wood is a 81 y.o. male with   1. 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  -PET 12/03/20 showed: esophageal tumor with local perigastric infiltration; hepatic and nodal metastasis involving the chest and abdomen extending to lower retroperitoneum; burst fracture at L5 with increased metabolic activity -Molecular testing showed MMR normal, PD-L1 CPS 3% -He received palliative radiation to the primary tumor 9/15 - 12/27/20 -He began single agent Xeloda 1 week on/1 week off and nivolumab every 2 weeks on 01/07/21, tolerating well -Dysphagia has resolved, except pills, while on treatment, c/w clinical response -we will plan for restaging scan in about 8 weeks from now; I ordered today   2.  Cirrhosis, abdominal pain -felt to be alcohol-related. He has abstained from alcohol since 2020-06-06 -S/p paracentesis 05/09/20 yielding 4.4 liters, path showed reactive mesothelial cells -Compensated, hepatic function panel 09/17/20 WNL -Continue spironolactone.   Hold Lasix -followed by GI   3. Social support -He is widowed and lives alone, his wife died from [place] in 06-07-15, she was treated here -2 daughters live out of town, [name] lives in [place] and is the closest, also his primary contact for healthcare -He has church community and some family friends who could help if needed -He has met outpatient palliative care -Patient is independent with ADLs but has a Secretary/administrator and yard service.  -Previously referred to social work   4. Family history  -Patient's mother had ovarian cancer, father had prostate cancer, and a daughter has breast cancer at age 71 -His daughter [name] without cancer underwent genetic testing and is negative -He was referred to genetics and underwent testing 12/09/2020, results were negative   PLAN: -proceed with nivo today and every 4 weeks -continue Xeloda -lab, f/u, and nivo in 4 and 8 weeks  -restaging CT to be done in 7-8 weeks    No problem-specific Assessment & Plan notes found for this encounter.   SUMMARY OF ONCOLOGIC HISTORY: Oncology History Overview Note  Cancer Staging No matching staging information was found for the patient.    Primary adenocarcinoma of distal third of esophagus ([place])  11/11/2020 Procedure   EGD by Dr. Watt Climes - Normal larynx. -? Malignant-appearing esophageal stenosis. Biopsied. - Pills were found in the esophagus. Removal was successful. - Rule out malignancy,? gastric tumor in the cardia difficult to say based on bleeding from passing the scope and unable to wash and suction for adequate visualization. - Normal ampulla, duodenal bulb, first portion of the duodenum, second portion of the duodenum, major papilla and area of the papilla. - The examination was otherwise normal.   11/11/2020 Initial Biopsy   FINAL MICROSCOPIC  DIAGNOSIS:  A. ESOPHAGUS, DISTAL, BIOPSY:  -  Poorly differentiated adenocarcinoma  -  See comment    11/11/2020 Initial Diagnosis   Primary  adenocarcinoma of distal third of esophagus ([place])   11/11/2020 Cancer Staging   Staging form: Esophagus - Adenocarcinoma, AJCC 8th Edition - Clinical stage from 11/11/2020: Stage IVB (cTX, cN1, cM1) - Signed by [name], MD on 12/26/2020    11/14/2020 Imaging   CT CAPIMPRESSION: 1. Aggressive appearing new tumor of the gastroesophageal junction confluent with the irregular collection of right gastric lymph nodes, and with new associated metastatic retroperitoneal adenopathy. Small paraesophageal lymph nodes in the thorax are increased in size from prior exam and could also be involved, and there is a nonspecific 1.0 cm lesion inferiorly in the right hepatic lobe which could potentially be metastatic. 2. New subacute burst (AO type A4) fracture of L5, with mild posterior bony retropulsion and with a dominant coronally oriented fracture plane, and about 50% loss of height. Correlate with interval trauma. 3. Slight thickening along the left paracolic gutter and trace pelvic ascites, although the ascites is markedly reduced compared to 05/04/2020. 4. Other imaging findings of potential clinical significance: Aortic Atherosclerosis (ICD10-I70.0). Coronary atherosclerosis. Old compression fracture at L2.   12/03/2020 PET scan   IMPRESSION: Tumor at the GE junction with local perigastric infiltration, hepatic and nodal metastases.   Nodal involvement both in the chest and abdomen extending to the lower retroperitoneum in the abdomen.   Burst fracture at L5 shows signs of increased metabolic activity, nonspecific in the setting of fracture, in light of other findings would correlate with any history of trauma, if no history of trauma consider MRI to exclude the possibility of pathologic fracture.   12/27/2020 Genetic Testing   Negative hereditary cancer genetic testing: no pathogenic variants detected in [facility] Multi-Cancer +RNA Panel.  The report date is December 27, 2020.   The  Multi-Cancer + RNA Panel offered by [facility] includes sequencing and/or deletion/duplication analysis of the following 84 genes:  AIP*, ALK, APC*, ATM*, AXIN2*, BAP1*, BARD1*, BLM*, BMPR1A*, BRCA1*, BRCA2*, BRIP1*, CASR, CDC73*, CDH1*, CDK4, CDKN1B*, CDKN1C*, CDKN2A, CEBPA, CHEK2*, CTNNA1*, DICER1*, DIS3L2*, EGFR, EPCAM, FH*, FLCN*, GATA2*, GPC3, GREM1, HOXB13, HRAS, KIT, MAX*, MEN1*, MET, MITF, MLH1*, MSH2*, MSH3*, MSH6*, MUTYH*, NBN*, NF1*, NF2*, NTHL1*, PALB2*, PDGFRA, PHOX2B, PMS2*, POLD1*, POLE*, POT1*, PRKAR1A*, PTCH1*, PTEN*, RAD50*, RAD51C*, RAD51D*, RB1*, RECQL4, RET, RUNX1*, SDHA*, SDHAF2*, SDHB*, SDHC*, SDHD*, SMAD4*, SMARCA4*, SMARCB1*, SMARCE1*, STK11*, SUFU*, TERC, TERT, TMEM127*, Tp53*, TSC1*, TSC2*, VHL*, WRN*, and WT1.  RNA analysis is performed for * genes.   01/07/2021 -  Chemotherapy   Patient is on Treatment Plan : GASTROESOPHAGEAL Nivolumab q14d x 8 cycles / Nivolumab q28d     03/26/2021 Imaging   CT CAP IMPRESSION: 1. Evidence of treatment response. There is decreased size of the irregular infiltrative mass centered at the gastroesophageal junction as well as decreased size of numerous metastatic retroperitoneal and upper abdominal lymph nodes. 2. 2.1 cm hepatic metastasis at the inferior right hepatic lobe segment 6 is stable to slightly increased in size since previous PET-CT. No new hepatic mass visualized. 3. A few new adjacent small pulmonary nodules in the posterior left lower lobe measuring up to 4 mm in size. Continued follow-up recommended. 4. Small right pleural effusion which is new since previous study. Small pericardial effusion increased since previous study. 5. Other ancillary findings as described.        INTERVAL HISTORY:  PAYSON EVRARD is here for a follow  up of esophageal cancer. He was last seen by NP Lacie on 04/01/21. He presents to the clinic accompanied by his daughter. He reports he continues to tolerate treatment well. He notes note excessive gas but  denies constipation. He also notes some numbness to the bottom of his feet, mainly noticed at night.   All other systems were reviewed with the patient and are negative.  MEDICAL HISTORY:  Past Medical History:  Diagnosis Date   AKI (acute kidney injury) ([place])    Alcohol abuse    Arthritis    Cataract 2006   Diarrhea    Edema leg    Esophageal cancer ([place])    Family history of breast cancer 12/09/2020   Family history of ovarian cancer 12/09/2020   Family history of prostate cancer 12/09/2020   GERD (gastroesophageal reflux disease)    Glaucoma unknown   Hyperbilirubinemia    Hyperlipemia    Hypertension    Hypokalemia    Liver failure (H[facility])    Macular degeneration, wet ([facility])    Microcytic anemia    Osteoarthritis    SOB (shortness of breath)    Weight gain     SURGICAL HISTORY: Past Surgical History:  Procedure Laterality Date   ANKLE SURGERY     BIOPSY  11/11/2020   Procedure: BIOPSY;  Surgeon: [name], MD;  Location: [facility];  Service: Gastroenterology;;   CATARACT EXTRACTION, BILATERAL     ESOPHAGOGASTRODUODENOSCOPY (EGD) WITH PROPOFOL N/A 11/11/2020   Procedure: ESOPHAGOGASTRODUODENOSCOPY (EGD) WITH PROPOFOL;  Surgeon: [name], MD;  Location: [facility];  Service: Gastroenterology;  Laterality: N/A;   EYE SURGERY  2006   FOREIGN BODY REMOVAL  11/11/2020   Procedure: FOREIGN BODY REMOVAL;  Surgeon: [name], MD;  Location: [facility];  Service: Gastroenterology;;   IR PARACENTESIS  05/09/2020   TONSILLECTOMY      I have reviewed the social history and family history with the patient and they are unchanged from previous note.  ALLERGIES:  is allergic to lisinopril.  MEDICATIONS:  Current Outpatient Medications  Medication Sig Dispense Refill   azelastine (ASTELIN) 0.1 % nasal spray Place 2 sprays into both nostrils 2 (two) times daily. Use in each nostril as directed 30 mL 1   capecitabine (XELODA) 500 MG tablet Take 3 tabs in the morning  and evening, 30 minutes after meal,  for 7 days on and 7 days off 84 tablet 2   cetirizine (ZYRTEC) 10 MG tablet Take 10 mg by mouth daily.     EPINEPHrine 0.3 mg/0.3 mL IJ SOAJ injection Inject 0.3 mg into the muscle as needed for anaphylaxis.     ezetimibe (ZETIA) 10 MG tablet Take 1 tablet (10 mg total) by mouth daily. 90 tablet 3   GLUCOSAMINE-CHONDROITIN PO Take 1 capsule by mouth daily.     lactulose (CHRONULAC) 10 GM/15ML solution Take 15 mLs (10 g total) by mouth daily as needed for mild constipation. 236 mL 0   moxifloxacin (VIGAMOX) 0.5 % ophthalmic solution Place one drop into the left eye 4 (four) times daily. Do not take tonight.  Bring to office tomorrow.     Multiple Vitamins-Minerals (ONE-A-DAY MENS 50+) TABS Take 1 tablet by mouth daily.     Multiple Vitamins-Minerals (PRESERVISION AREDS 2 PO) Take 1 tablet by mouth 2 (two) times daily.     Netarsudil Dimesylate (RHOPRESSA) 0.02 % SOLN Place 1 drop into the left eye daily.     spironolactone (ALDACTONE) 50 MG tablet Take 1 tablet (50  mg total) by mouth daily. 30 tablet 2   sucralfate (CARAFATE) 1 g tablet Take 1 tablet (1 g total) by mouth 4 (four) times daily. Dissolve each tablet in 15 cc water before use. 120 tablet 2   No current facility-administered medications for this visit.    PHYSICAL EXAMINATION: ECOG PERFORMANCE STATUS: 2 - Symptomatic, <50% confined to bed  Vitals:   05/07/21 1041  BP: 130/65  Pulse: 76  Resp: 19  Temp: (!) 97.5 F (36.4 C)  SpO2: 100%   Wt Readings from Last 3 Encounters:  05/07/21 168 lb 11.2 oz (76.5 kg)  04/16/21 171 lb (77.6 kg)  04/01/21 167 lb 9.6 oz (76 kg)     GENERAL:alert, no distress and comfortable SKIN: skin color normal, no rashes or significant lesions EYES: normal, Conjunctiva are pink and non-injected, sclera clear  NEURO: alert & oriented x 3 with fluent speech  LABORATORY DATA:  I have reviewed the data as listed CBC Latest Ref Rng & Units 05/07/2021 04/01/2021  03/17/2021  WBC 4.0 - 10.5 K/uL 4.0 3.7(L) 4.1  Hemoglobin 13.0 - 17.0 g/dL 11.4(L) 11.3(L) 12.2(L)  Hematocrit 39.0 - 52.0 % 33.9(L) 34.3(L) 37.9(L)  Platelets 150 - 400 K/uL 139(L) 120(L) 140(L)     CMP Latest Ref Rng & Units 05/07/2021 04/01/2021 03/17/2021  Glucose 70 - 99 mg/dL 150(H) 166(H) 96  BUN 8 - 23 mg/dL _0 Creatinine 0.61 - 1.24 mg/dL 0.86 0.92 0.91  Sodium 135 - 145 mmol/L 138 136 138  Potassium 3.5 - 5.1 mmol/L 4.1 3.8 4.1  Chloride 98 - 111 mmol/L 108 109 109  CO2 22 - 32 mmol/L 23 21(L) 21(L)  Calcium 8.9 - 10.3 mg/dL 9.3 8.9 9.1  Total Protein 6.5 - 8.1 g/dL 6.8 6.4(L) 7.2  Total Bilirubin 0.3 - 1.2 mg/dL 0.9 0.7 1.0  Alkaline Phos 38 - 126 U/L 93 66 75  AST 15 - 41 U/L 40 19 21  ALT 0 - 44 U/L _1 RADIOGRAPHIC STUDIES: I have personally reviewed the radiological images as listed and agreed with the findings in the report. No results found.    Orders Placed This Encounter  Procedures   CT CHEST ABDOMEN PELVIS W CONTRAST    Standing Status:   Future    Standing Expiration Date:   05/07/2022    Order Specific Question:   Preferred imaging location?    Answer:   [facility]    Order Specific Question:   Release to patient    Answer:   Immediate    Order Specific Question:   Is Oral Contrast requested for this exam?    Answer:   Yes, Per Radiology protocol   All questions were answered. The patient knows to call the clinic with any problems, questions or concerns. No barriers to learning was detected. The total time spent in the appointment was 30 minutes.     [name], MD 05/07/2021   I, Wilburn Mylar, am acting as scribe for [name], MD.   I have reviewed the above documentation for accuracy and completeness, and I agree with the above.

## 2021-05-07 NOTE — Patient Instructions (Signed)
[facility] MEDICAL ONCOLOGY   ?Discharge Instructions: ?Thank you for choosing [facility] to provide your oncology and hematology care.  ? ?If you have a lab appointment with the Cancer Center, please go directly to the Cancer Center and check in at the registration area. ?  ?Wear comfortable clothing and clothing appropriate for easy access to any Portacath or PICC line.  ? ?We strive to give you quality time with your provider. You may need to reschedule your appointment if you arrive late (15 or more minutes).  Arriving late affects you and other patients whose appointments are after yours.  Also, if you miss three or more appointments without notifying the office, you may be dismissed from the clinic at the provider?s discretion.    ?  ?For prescription refill requests, have your pharmacy contact our office and allow 72 hours for refills to be completed.   ? ?Today you received the following chemotherapy and/or immunotherapy agents: Nivolumab (Opdivo)    ?  ?To help prevent nausea and vomiting after your treatment, we encourage you to take your nausea medication as directed. ? ?BELOW ARE SYMPTOMS THAT SHOULD BE REPORTED IMMEDIATELY: ?*FEVER GREATER THAN 100.4 F (38 ?C) OR HIGHER ?*CHILLS OR SWEATING ?*NAUSEA AND VOMITING THAT IS NOT CONTROLLED WITH YOUR NAUSEA MEDICATION ?*UNUSUAL SHORTNESS OF BREATH ?*UNUSUAL BRUISING OR BLEEDING ?*URINARY PROBLEMS (pain or burning when urinating, or frequent urination) ?*BOWEL PROBLEMS (unusual diarrhea, constipation, pain near the anus) ?TENDERNESS IN MOUTH AND THROAT WITH OR WITHOUT PRESENCE OF ULCERS (sore throat, sores in mouth, or a toothache) ?UNUSUAL RASH, SWELLING OR PAIN  ?UNUSUAL VAGINAL DISCHARGE OR ITCHING  ? ?Items with * indicate a potential emergency and should be followed up as soon as possible or go to the Emergency Department if any problems should occur. ? ?Please show the CHEMOTHERAPY ALERT CARD or IMMUNOTHERAPY ALERT CARD at  check-in to the Emergency Department and triage nurse. ? ?Should you have questions after your visit or need to cancel or reschedule your appointment, please contact [facility]ICAL [facility]COL[facility]  Dept: [phone]  and follow the prompts.  Office hours are 8:00 a.m. to 4:30 p.m. Monday - Friday. Please note that voicemails left after 4:00 p.m. may not be returned until the following business day.  We are closed weekends and major holidays. You have access to a nurse at all times for urgent questions. Please call the main number to the clinic Dept: [phone] and follow the prompts. ? ? ?For any non-urgent questions, you may also contact your provider using MyChart. We now offer e-Visits for anyone 18 and older to request care online for non-urgent symptoms. For details visit [email or web address]. ?  ?Also download the MyChart app! Go to the app store, search "MyChart", open the app, select [facility], and log in with your MyChart username and password. ? ?Due to Covid, a mask is required upon entering the hospital/clinic. If you do not have a mask, one will be given to you upon arrival. For doctor visits, patients may have 1 support person aged 18 or older with them. For treatment visits, patients cannot have anyone with them due to current Covid guidelines and our immunocompromised population.  ? ?

## 2021-05-08 DIAGNOSIS — H26492 Other secondary cataract, left eye: Secondary | ICD-10-CM | POA: Diagnosis not present

## 2021-05-08 LAB — T4: T4, Total: 17.5 ug/dL — ABNORMAL HIGH (ref 4.5–12.0)

## 2021-05-21 ENCOUNTER — Other Ambulatory Visit: Payer: Self-pay

## 2021-05-21 ENCOUNTER — Telehealth: Payer: Self-pay

## 2021-05-21 DIAGNOSIS — E0590 Thyrotoxicosis, unspecified without thyrotoxic crisis or storm: Secondary | ICD-10-CM

## 2021-05-21 DIAGNOSIS — C155 Malignant neoplasm of lower third of esophagus: Secondary | ICD-10-CM

## 2021-05-21 MED ORDER — CAPECITABINE 500 MG PO TABS: ORAL_TABLET | ORAL | 2 refills | Status: DC

## 2021-05-21 NOTE — Telephone Encounter (Signed)
LVM stating Dr. Burr Medico has referred the pt to Dr. Cordelia Pen office with [facility] d/t the [facility] causing his Thyroid function/labs to be abnormal.  Informed pt that someone from Dr. Cordelia Pen office will be contacting him to get his scheduled to see Dr. Loanne Drilling.  Informed pt that Dr. Burr Medico will be holding his Opdivo in March 2023 but will keep his appt for lab and f/u with her.  Also stated, that Dr. Burr Medico would like for the pt to continue to take his Capecitabine and a refill was sent today.  Dr. Burr Medico is hoping the pt can resume Opdivo in April 2023 once his Thyroid function is being managed by Endocrinology.  Also spoke with pt's daughter again to inform her that Dr. Burr Medico would like for the pt to continue taking his Capecitabine and will see the pt in clinic on 06/05/2021 but pt will not get his Opdivo infusion.  Pt's daughter verbalized understanding of instructions and had no further questions or concerns.  Pt's daughter stated she will contact her father to make sure he understands what's going on regarding my voicemail message to the pt.  Instructed pt to contact Dr. Ernestina Penna office in the voicemail should he have additional questions or concerns.

## 2021-05-21 NOTE — Telephone Encounter (Signed)
Spoke with pt's daughter via telephone regarding referral to Dr. Renato Shin at [facility].  Explained that Dr. Burr Medico would like the pt's father to be seen by Dr. Loanne Drilling d/t the pt's new onset abnormal thyroid function which Dr. Burr Medico thinks it maybe related to pt's immunotherapy.  Informed pt's daughter that Dr. Burr Medico would like to cancel the pt's appointments in March 2023 and see them back in clinic in April 2023.  Pt's daughter verbalized understanding of explanation and asked if the pt should continue to take is Capecitabine.  Informed pt's daughter that this RN will consult with Dr. Burr Medico and will follow back-up with the pt and his daughter regarding the Capecitabine.

## 2021-05-21 NOTE — Progress Notes (Signed)
Placed order for referral to Dr. Renato Shin w/[facility] [phone]).  Faxed referral, pt demographics, and message from Dr. Burr Medico to Dr. Loanne Drilling to Dr. Cordelia Pen office.  Faxed confirmation confirmed.

## 2021-05-27 ENCOUNTER — Telehealth: Payer: Self-pay

## 2021-05-27 ENCOUNTER — Other Ambulatory Visit (HOSPITAL_COMMUNITY): Payer: Self-pay

## 2021-05-27 NOTE — Telephone Encounter (Signed)
VM left for patient to check in and offer a Palliative care visit. Call back information provided.

## 2021-05-27 NOTE — Telephone Encounter (Signed)
VM left for patient on cell phone to check in and offer to schedule visit with Palliative care NP

## 2021-06-02 ENCOUNTER — Encounter: Payer: Self-pay | Admitting: Hematology

## 2021-06-05 ENCOUNTER — Other Ambulatory Visit: Payer: Self-pay

## 2021-06-05 ENCOUNTER — Inpatient Hospital Stay (HOSPITAL_BASED_OUTPATIENT_CLINIC_OR_DEPARTMENT_OTHER): Payer: Medicare Other | Admitting: Hematology

## 2021-06-05 ENCOUNTER — Inpatient Hospital Stay: Payer: Medicare Other

## 2021-06-05 ENCOUNTER — Encounter: Payer: Self-pay | Admitting: Hematology

## 2021-06-05 ENCOUNTER — Inpatient Hospital Stay: Payer: Medicare Other | Attending: Nurse Practitioner

## 2021-06-05 VITALS — BP 137/55 | HR 78 | Temp 98.4°F | Resp 18 | Ht 71.0 in | Wt 169.0 lb

## 2021-06-05 DIAGNOSIS — C155 Malignant neoplasm of lower third of esophagus: Secondary | ICD-10-CM

## 2021-06-05 DIAGNOSIS — Z8042 Family history of malignant neoplasm of prostate: Secondary | ICD-10-CM | POA: Insufficient documentation

## 2021-06-05 DIAGNOSIS — I2510 Atherosclerotic heart disease of native coronary artery without angina pectoris: Secondary | ICD-10-CM | POA: Diagnosis not present

## 2021-06-05 DIAGNOSIS — E785 Hyperlipidemia, unspecified: Secondary | ICD-10-CM | POA: Insufficient documentation

## 2021-06-05 DIAGNOSIS — K7460 Unspecified cirrhosis of liver: Secondary | ICD-10-CM | POA: Diagnosis not present

## 2021-06-05 DIAGNOSIS — K219 Gastro-esophageal reflux disease without esophagitis: Secondary | ICD-10-CM | POA: Insufficient documentation

## 2021-06-05 DIAGNOSIS — M1990 Unspecified osteoarthritis, unspecified site: Secondary | ICD-10-CM | POA: Diagnosis not present

## 2021-06-05 DIAGNOSIS — E0590 Thyrotoxicosis, unspecified without thyrotoxic crisis or storm: Secondary | ICD-10-CM | POA: Diagnosis not present

## 2021-06-05 DIAGNOSIS — Z803 Family history of malignant neoplasm of breast: Secondary | ICD-10-CM | POA: Insufficient documentation

## 2021-06-05 DIAGNOSIS — Z8501 Personal history of malignant neoplasm of esophagus: Secondary | ICD-10-CM | POA: Diagnosis not present

## 2021-06-05 DIAGNOSIS — Z79899 Other long term (current) drug therapy: Secondary | ICD-10-CM | POA: Diagnosis not present

## 2021-06-05 DIAGNOSIS — J90 Pleural effusion, not elsewhere classified: Secondary | ICD-10-CM | POA: Diagnosis not present

## 2021-06-05 DIAGNOSIS — Z8041 Family history of malignant neoplasm of ovary: Secondary | ICD-10-CM | POA: Insufficient documentation

## 2021-06-05 LAB — IRON AND IRON BINDING CAPACITY (CC-WL,HP ONLY)
Iron: 61 ug/dL (ref 45–182)
Saturation Ratios: 16 % — ABNORMAL LOW (ref 17.9–39.5)
TIBC: 391 ug/dL (ref 250–450)
UIBC: 330 ug/dL (ref 117–376)

## 2021-06-05 LAB — CMP (CANCER CENTER ONLY)
ALT: 9 U/L (ref 0–44)
AST: 22 U/L (ref 15–41)
Albumin: 3.7 g/dL (ref 3.5–5.0)
Alkaline Phosphatase: 84 U/L (ref 38–126)
Anion gap: 6 (ref 5–15)
BUN: 20 mg/dL (ref 8–23)
CO2: 24 mmol/L (ref 22–32)
Calcium: 9.2 mg/dL (ref 8.9–10.3)
Chloride: 107 mmol/L (ref 98–111)
Creatinine: 1.01 mg/dL (ref 0.61–1.24)
GFR, Estimated: >60 mL/min (ref >60)
Glucose, Bld: 200 mg/dL — ABNORMAL HIGH (ref 70–99)
Potassium: 4.3 mmol/L (ref 3.5–5.1)
Sodium: 137 mmol/L (ref 135–145)
Total Bilirubin: 0.8 mg/dL (ref 0.3–1.2)
Total Protein: 6.4 g/dL — ABNORMAL LOW (ref 6.5–8.1)

## 2021-06-05 LAB — CBC WITH DIFFERENTIAL (CANCER CENTER ONLY)
Abs Immature Granulocytes: 0 10*3/uL (ref 0.00–0.07)
Basophils Absolute: 0 10*3/uL (ref 0.0–0.1)
Basophils Relative: 1 %
Eosinophils Absolute: 0.1 10*3/uL (ref 0.0–0.5)
Eosinophils Relative: 3 %
HCT: 33.6 % — ABNORMAL LOW (ref 39.0–52.0)
Hemoglobin: 11.3 g/dL — ABNORMAL LOW (ref 13.0–17.0)
Immature Granulocytes: 0 %
Lymphocytes Relative: 21 %
Lymphs Abs: 0.8 10*3/uL (ref 0.7–4.0)
MCH: 34.6 pg — ABNORMAL HIGH (ref 26.0–34.0)
MCHC: 33.6 g/dL (ref 30.0–36.0)
MCV: 102.8 fL — ABNORMAL HIGH (ref 80.0–100.0)
Monocytes Absolute: 0.4 10*3/uL (ref 0.1–1.0)
Monocytes Relative: 11 %
Neutro Abs: 2.4 10*3/uL (ref 1.7–7.7)
Neutrophils Relative %: 64 %
Platelet Count: 153 10*3/uL (ref 150–400)
RBC: 3.27 MIL/uL — ABNORMAL LOW (ref 4.22–5.81)
RDW: 18.3 % — ABNORMAL HIGH (ref 11.5–15.5)
WBC Count: 3.8 10*3/uL — ABNORMAL LOW (ref 4.0–10.5)
nRBC: 0 % (ref 0.0–0.2)

## 2021-06-05 LAB — TSH: TSH: 0.788 u[IU]/mL (ref 0.320–4.118)

## 2021-06-05 LAB — FERRITIN: Ferritin: 30 ng/mL (ref 24–336)

## 2021-06-05 NOTE — Progress Notes (Signed)
[place]   [phone] [phone]   Clinic Follow up Note   Patient Care Team: [name], [name], MD as PCP - General (Family Medicine) [name], MD as Consulting Physician (Gastroenterology) [name], MD as Consulting Physician (Hematology) [name], NP as Nurse Practitioner (Nurse Practitioner) [name], RN as Registered Nurse  Date of Service:  06/05/2021  CHIEF COMPLAINT: f/u of esophageal cancer  CURRENT THERAPY:  Xeloda 1500 mg q12h 1 week on/1 week off, starting 01/06/21 Nivolumab q2 weeks, starting 01/07/21. Change to q4 weeks starting 04/01/21, held on 3/9 due to abnormal thyroid function   ASSESSMENT & PLAN:  Randy Wood is a 81 y.o. male with   1. 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  -PET 12/03/20 showed: esophageal tumor with local perigastric infiltration; hepatic and nodal metastasis involving the chest and abdomen extending to lower retroperitoneum; burst fracture at L5 with increased metabolic activity -Molecular testing showed MMR normal, PD-L1 CPS 3% -He received palliative radiation to the primary tumor 9/15 - 12/27/20 -He began single agent Xeloda 1 week on/1 week off and nivolumab every 2 weeks on 01/07/21, tolerating well -Dysphagia has resolved, except pills, while on treatment, c/w clinical response -his thyroid function has became abnormal on nivolumab. We will hold for today, continue Xeloda, and he will meet Dr. Kelton Pillar next week.  -we will plan for restaging scan in about 4 weeks from now.  2. 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 he has not symptoms of hyperthyroidism  -referral placed to endocrinology, scheduled to meet Dr. Kelton Pillar on 06/11/21.   3. Cirrhosis, abdominal pain -felt to be alcohol-related. He has abstained from alcohol since Jun 03, 2020 -S/p paracentesis 05/09/20 yielding 4.4 liters, path showed reactive mesothelial cells -Compensated, hepatic function panel 09/17/20 WNL -Continue spironolactone.  Hold Lasix -followed by GI   4. Social support -He is widowed and lives alone, his wife died from [place] in 2015-06-04, she was treated here -2 daughters live out of town, [name] lives in [place] and is the closest, also his primary contact for healthcare -He has church community and some family friends who could help if needed -He has met outpatient palliative care -Patient is independent with ADLs but has a Secretary/administrator and yard service.    5. Family history  -Patient's mother had ovarian cancer, father had prostate cancer, and a daughter has breast cancer at age 90 -His daughter [name] without cancer underwent genetic testing and is negative -He was referred to genetics and underwent testing 12/09/20, results were negative     PLAN: -hold Nivo for one dose  today -continue Xeloda -restaging CT to be done in 3-4 weeks before next Nivo treatment in 4 weeks    No problem-specific Assessment & Plan notes found for this encounter.   SUMMARY OF ONCOLOGIC HISTORY: Oncology History Overview Note  Cancer Staging No matching staging information was found for the patient.    Primary adenocarcinoma of distal third of esophagus ([place])  11/11/2020 Procedure   EGD by Dr. Watt Climes - Normal larynx. -? Malignant-appearing esophageal stenosis. Biopsied. - Pills were found in the esophagus. Removal was successful. - Rule out malignancy,? gastric tumor in the  cardia difficult to say based on bleeding from passing the scope and unable to wash and suction for adequate visualization. - Normal ampulla, duodenal bulb,  first portion of the duodenum, second portion of the duodenum, major papilla and area of the papilla. - The examination was otherwise normal.   11/11/2020 Initial Biopsy   FINAL MICROSCOPIC DIAGNOSIS:  A. ESOPHAGUS, DISTAL, BIOPSY:  -  Poorly differentiated adenocarcinoma  -  See comment    11/11/2020 Initial Diagnosis   Primary adenocarcinoma of distal third of esophagus ([place])   11/11/2020 Cancer Staging   Staging form: Esophagus - Adenocarcinoma, AJCC 8th Edition - Clinical stage from 11/11/2020: Stage IVB (cTX, cN1, cM1) - Signed by [name], MD on 12/26/2020    11/14/2020 Imaging   CT CAPIMPRESSION: 1. Aggressive appearing new tumor of the gastroesophageal junction confluent with the irregular collection of right gastric lymph nodes, and with new associated metastatic retroperitoneal adenopathy. Small paraesophageal lymph nodes in the thorax are increased in size from prior exam and could also be involved, and there is a nonspecific 1.0 cm lesion inferiorly in the right hepatic lobe which could potentially be metastatic. 2. New subacute burst (AO type A4) fracture of L5, with mild posterior bony retropulsion and with a dominant coronally oriented fracture plane, and about 50% loss of height. Correlate with interval trauma. 3. Slight thickening along the left paracolic gutter and trace pelvic ascites, although the ascites is markedly reduced compared to 05/04/2020. 4. Other imaging findings of potential clinical significance: Aortic Atherosclerosis (ICD10-I70.0). Coronary atherosclerosis. Old compression fracture at L2.   12/03/2020 PET scan   IMPRESSION: Tumor at the GE junction with local perigastric infiltration, hepatic and nodal metastases.   Nodal involvement both in the chest and abdomen extending to the lower retroperitoneum in the abdomen.   Burst fracture at L5 shows signs of increased metabolic activity, nonspecific in the setting of fracture, in light of other  findings would correlate with any history of trauma, if no history of trauma consider MRI to exclude the possibility of pathologic fracture.   12/27/2020 Genetic Testing   Negative hereditary cancer genetic testing: no pathogenic variants detected in [facility] Multi-Cancer +RNA Panel.  The report date is December 27, 2020.   The Multi-Cancer + RNA Panel offered by [facility] includes sequencing and/or deletion/duplication analysis of the following 84 genes:  AIP*, ALK, APC*, ATM*, AXIN2*, BAP1*, BARD1*, BLM*, BMPR1A*, BRCA1*, BRCA2*, BRIP1*, CASR, CDC73*, CDH1*, CDK4, CDKN1B*, CDKN1C*, CDKN2A, CEBPA, CHEK2*, CTNNA1*, DICER1*, DIS3L2*, EGFR, EPCAM, FH*, FLCN*, GATA2*, GPC3, GREM1, HOXB13, HRAS, KIT, MAX*, MEN1*, MET, MITF, MLH1*, MSH2*, MSH3*, MSH6*, MUTYH*, NBN*, NF1*, NF2*, NTHL1*, PALB2*, PDGFRA, PHOX2B, PMS2*, POLD1*, POLE*, POT1*, PRKAR1A*, PTCH1*, PTEN*, RAD50*, RAD51C*, RAD51D*, RB1*, RECQL4, RET, RUNX1*, SDHA*, SDHAF2*, SDHB*, SDHC*, SDHD*, SMAD4*, SMARCA4*, SMARCB1*, SMARCE1*, STK11*, SUFU*, TERC, TERT, TMEM127*, Tp53*, TSC1*, TSC2*, VHL*, WRN*, and WT1.  RNA analysis is performed for * genes.   01/07/2021 -  Chemotherapy   Patient is on Treatment Plan : GASTROESOPHAGEAL Nivolumab q14d x 8 cycles / Nivolumab q28d     03/26/2021 Imaging   CT CAP IMPRESSION: 1. Evidence of treatment response. There is decreased size of the irregular infiltrative mass centered at the gastroesophageal junction as well as decreased size of numerous metastatic retroperitoneal and upper abdominal lymph nodes. 2. 2.1 cm hepatic metastasis at the inferior right hepatic lobe segment 6 is stable to slightly increased in size since previous PET-CT. No new hepatic mass visualized. 3. A few new adjacent small pulmonary nodules in  the posterior left lower lobe measuring up to 4 mm in size. Continued follow-up recommended. 4. Small right pleural effusion which is new since previous study. Small pericardial effusion increased  since previous study. 5. Other ancillary findings as described.        INTERVAL HISTORY:  Randy Wood is here for a follow up of esophageal cancer. He was last seen by me on 05/07/21. He presents to the clinic accompanied by his daughter. He reports he continues to tolerate treatment well.   All other systems were reviewed with the patient and are negative.  MEDICAL HISTORY:  Past Medical History:  Diagnosis Date   AKI (acute kidney injury) ([place])    Alcohol abuse    Arthritis    Cataract 2006   Diarrhea    Edema leg    Esophageal cancer ([place])    Family history of breast cancer 12/09/2020   Family history of ovarian cancer 12/09/2020   Family history of prostate cancer 12/09/2020   GERD (gastroesophageal reflux disease)    Glaucoma unknown   Hyperbilirubinemia    Hyperlipemia    Hypertension    Hypokalemia    Liver failure ([facility])    Macular degeneration, wet ([facility])    Microcytic anemia    Osteoarthritis    SOB (shortness of breath)    Weight gain     SURGICAL HISTORY: Past Surgical History:  Procedure Laterality Date   ANKLE SURGERY     BIOPSY  11/11/2020   Procedure: BIOPSY;  Surgeon: [name], MD;  Location: [facility];  Service: Gastroenterology;;   CATARACT EXTRACTION, BILATERAL     ESOPHAGOGASTRODUODENOSCOPY (EGD) WITH PROPOFOL N/A 11/11/2020   Procedure: ESOPHAGOGASTRODUODENOSCOPY (EGD) WITH PROPOFOL;  Surgeon: [name], MD;  Location: [facility];  Service: Gastroenterology;  Laterality: N/A;   EYE SURGERY  2006   FOREIGN BODY REMOVAL  11/11/2020   Procedure: FOREIGN BODY REMOVAL;  Surgeon: [name], MD;  Location: [facility];  Service: Gastroenterology;;   IR PARACENTESIS  05/09/2020   TONSILLECTOMY      I have reviewed the social history and family history with the patient and they are unchanged from previous note.  ALLERGIES:  is allergic to lisinopril.  MEDICATIONS:  Current Outpatient Medications  Medication Sig Dispense Refill    dorzolamide-timolol (COSOPT) 22.3-6.8 MG/ML ophthalmic solution Place 1 drop into the left eye 2 (two) times daily.     latanoprost (XALATAN) 0.005 % ophthalmic solution Place 1 drop into the left eye at bedtime.     azelastine (ASTELIN) 0.1 % nasal spray Place 2 sprays into both nostrils 2 (two) times daily. Use in each nostril as directed 30 mL 1   capecitabine (XELODA) 500 MG tablet Take 3 tabs in the morning and evening, 30 minutes after meal,  for 7 days on and 7 days off 84 tablet 2   cetirizine (ZYRTEC) 10 MG tablet Take 10 mg by mouth daily.     EPINEPHrine 0.3 mg/0.3 mL IJ SOAJ injection Inject 0.3 mg into the muscle as needed for anaphylaxis.     ezetimibe (ZETIA) 10 MG tablet Take 1 tablet (10 mg total) by mouth daily. 90 tablet 3   GLUCOSAMINE-CHONDROITIN PO Take 1 capsule by mouth daily.     lactulose (CHRONULAC) 10 GM/15ML solution Take 15 mLs (10 g total) by mouth daily as needed for mild constipation. 236 mL 0   moxifloxacin (VIGAMOX) 0.5 % ophthalmic solution Place one drop into the left eye 4 (four) times daily. Do not  take tonight.  Bring to office tomorrow.     Multiple Vitamins-Minerals (ONE-A-DAY MENS 50+) TABS Take 1 tablet by mouth daily.     Multiple Vitamins-Minerals (PRESERVISION AREDS 2 PO) Take 1 tablet by mouth 2 (two) times daily.     Netarsudil Dimesylate (RHOPRESSA) 0.02 % SOLN Place 1 drop into the left eye daily.     spironolactone (ALDACTONE) 50 MG tablet Take 1 tablet (50 mg total) by mouth daily. 30 tablet 2   sucralfate (CARAFATE) 1 g tablet Take 1 tablet (1 g total) by mouth 4 (four) times daily. Dissolve each tablet in 15 cc water before use. 120 tablet 2   No current facility-administered medications for this visit.    PHYSICAL EXAMINATION: ECOG PERFORMANCE STATUS: 1 - Symptomatic but completely ambulatory  Vitals:   06/05/21 1032  BP: (!) 137/55  Pulse: 78  Resp: 18  Temp: 98.4 F (36.9 C)  SpO2: 100%   Wt Readings from Last 3 Encounters:   06/05/21 169 lb (76.7 kg)  05/07/21 168 lb 11.2 oz (76.5 kg)  04/16/21 171 lb (77.6 kg)     GENERAL:alert, no distress and comfortable SKIN: skin color normal, no rashes or significant lesions EYES: normal, Conjunctiva are pink and non-injected, sclera clear  NEURO: alert & oriented x 3 with fluent speech  LABORATORY DATA:  I have reviewed the data as listed CBC Latest Ref Rng & Units 06/05/2021 05/07/2021 04/01/2021  WBC 4.0 - 10.5 K/uL 3.8(L) 4.0 3.7(L)  Hemoglobin 13.0 - 17.0 g/dL 11.3(L) 11.4(L) 11.3(L)  Hematocrit 39.0 - 52.0 % 33.6(L) 33.9(L) 34.3(L)  Platelets 150 - 400 K/uL 153 139(L) 120(L)     CMP Latest Ref Rng & Units 06/05/2021 05/07/2021 04/01/2021  Glucose 70 - 99 mg/dL 200(H) 150(H) 166(H)  BUN 8 - 23 mg/dL _0 Creatinine 0.61 - 1.24 mg/dL 1.01 0.86 0.92  Sodium 135 - 145 mmol/L 137 138 136  Potassium 3.5 - 5.1 mmol/L 4.3 4.1 3.8  Chloride 98 - 111 mmol/L 107 108 109  CO2 22 - 32 mmol/L 24 23 21(L)  Calcium 8.9 - 10.3 mg/dL 9.2 9.3 8.9  Total Protein 6.5 - 8.1 g/dL 6.4(L) 6.8 6.4(L)  Total Bilirubin 0.3 - 1.2 mg/dL 0.8 0.9 0.7  Alkaline Phos 38 - 126 U/L 84 93 66  AST 15 - 41 U/L 22 40 19  ALT 0 - 44 U/L _1 RADIOGRAPHIC STUDIES: I have personally reviewed the radiological images as listed and agreed with the findings in the report. No results found.    No orders of the defined types were placed in this encounter.  All questions were answered. The patient knows to call the clinic with any problems, questions or concerns. No barriers to learning was detected. The total time spent in the appointment was 30 minutes.     [name], MD 06/05/2021   I, Wilburn Mylar, am acting as scribe for [name], MD.   I have reviewed the above documentation for accuracy and completeness, and I agree with the above.

## 2021-06-06 LAB — T4: T4, Total: 11.5 ug/dL (ref 4.5–12.0)

## 2021-06-07 ENCOUNTER — Other Ambulatory Visit (HOSPITAL_BASED_OUTPATIENT_CLINIC_OR_DEPARTMENT_OTHER): Payer: Self-pay | Admitting: Family Medicine

## 2021-06-07 DIAGNOSIS — J3089 Other allergic rhinitis: Secondary | ICD-10-CM

## 2021-06-09 ENCOUNTER — Encounter (INDEPENDENT_AMBULATORY_CARE_PROVIDER_SITE_OTHER): Payer: Medicare Other | Admitting: Ophthalmology

## 2021-06-09 ENCOUNTER — Other Ambulatory Visit: Payer: Self-pay

## 2021-06-09 DIAGNOSIS — H35033 Hypertensive retinopathy, bilateral: Secondary | ICD-10-CM | POA: Diagnosis not present

## 2021-06-09 DIAGNOSIS — H353122 Nonexudative age-related macular degeneration, left eye, intermediate dry stage: Secondary | ICD-10-CM

## 2021-06-09 DIAGNOSIS — I10 Essential (primary) hypertension: Secondary | ICD-10-CM

## 2021-06-09 DIAGNOSIS — H353211 Exudative age-related macular degeneration, right eye, with active choroidal neovascularization: Secondary | ICD-10-CM

## 2021-06-09 DIAGNOSIS — Z20822 Contact with and (suspected) exposure to covid-19: Secondary | ICD-10-CM | POA: Diagnosis not present

## 2021-06-09 DIAGNOSIS — H43813 Vitreous degeneration, bilateral: Secondary | ICD-10-CM | POA: Diagnosis not present

## 2021-06-11 ENCOUNTER — Other Ambulatory Visit: Payer: Self-pay

## 2021-06-11 ENCOUNTER — Ambulatory Visit (INDEPENDENT_AMBULATORY_CARE_PROVIDER_SITE_OTHER): Payer: Medicare Other | Admitting: Internal Medicine

## 2021-06-11 ENCOUNTER — Encounter: Payer: Self-pay | Admitting: Internal Medicine

## 2021-06-11 VITALS — BP 110/56 | HR 76 | Ht 71.0 in | Wt 170.2 lb

## 2021-06-11 DIAGNOSIS — E0590 Thyrotoxicosis, unspecified without thyrotoxic crisis or storm: Secondary | ICD-10-CM | POA: Diagnosis not present

## 2021-06-11 NOTE — Progress Notes (Signed)
? ? ?Name: Randy Wood  ?MRN/ DOB: [id number], November 24, 1940    ?Age/ Sex: 81 y.o., male   ? ?PCP: [name], Raymond J, MD   ?Reason for Endocrinology Evaluation: Hyperthyroidism  ?   ?Date of Initial Endocrinology Evaluation: 06/11/2021   ? ? ?HPI: ?Randy Wood is a 81 y.o. male with a past medical history of HTN, GERD, IBS, cirrhosis and adenocarcinoma of the esophagus. The patient presented for initial endocrinology clinic visit on 06/11/2021 for consultative assistance with his Hyperthyroidism.  ? ?Patient has been noted with suppressed TSH on 05/07/2021 at < 0.080 uIU/mL and elevated total T4 at 17.5 UG/DL.  This was attributed to nivolumab which was held 06/05/2021 ? ?Of note, the patient follows with oncology for metastatic adenocarcinoma of the distal esophagus, which was diagnosed 10/2020.  He received palliative radiation to the primary tumor in 11/2020 ?He is on Xeloda(Capecitabine ) and nivolumab which was started 12/2020 ? ?He was on half a dose of nivolumab until 03/2021 when he went to a full dose and that is when his hyperthyroidism was noted ? ?Weight has been steady  ?Denies palpitations  ?Denies loose stools or diarrhea, takes stool softeners  ?Denies tremors  ?Denies local neck symptoms  ?Energy stable  ? ? ? ?No prior hx of thyroid disease  ?Daughter with Hashimoto's disease  ?Maternal grand mother with thyroid disease  ? ?HISTORY:  ?Past Medical History:  ?Past Medical History:  ?Diagnosis Date  ? AKI (acute kidney injury) ([place])   ? Alcohol abuse   ? Arthritis   ? Cataract 2006  ? Diarrhea   ? Edema leg   ? Esophageal cancer ([place])   ? Family history of breast cancer 12/09/2020  ? Family history of ovarian cancer 12/09/2020  ? Family history of prostate cancer 12/09/2020  ? GERD (gastroesophageal reflux disease)   ? Glaucoma unknown  ? Hyperbilirubinemia   ? Hyperlipemia   ? Hypertension   ? Hypokalemia   ? Liver failure ([place])   ? Macular degeneration, wet ([place])   ? Microcytic anemia   ?  Osteoarthritis   ? SOB (shortness of breath)   ? Weight gain   ? ?Past Surgical History:  ?Past Surgical History:  ?Procedure Laterality Date  ? ANKLE SURGERY    ? BIOPSY  11/11/2020  ? Procedure: BIOPSY;  Surgeon: [name], MD;  Location: [facility];  Service: Gastroenterology;;  ? CATARACT EXTRACTION, BILATERAL    ? ESOPHAGOGASTRODUODENOSCOPY (EGD) WITH PROPOFOL N/A 11/11/2020  ? Procedure: ESOPHAGOGASTRODUODENOSCOPY (EGD) WITH PROPOFOL;  Surgeon: [name], MD;  [facility];  Service: Gastroenterology;  Laterality: N/A;  ? EYE SURGERY  2006  ? FOREIGN BODY REMOVAL  11/11/2020  ? Procedure: FOREIGN BODY REMOVAL;  Surgeon: [name], MD;  Location: [facility];  Service: Gastroenterology;;  ? IR PARACENTESIS  05/09/2020  ? TONSILLECTOMY    ?  ?Social History:  reports that he quit smoking about 49 years ago. His smoking use included cigarettes. He has a 30.00 pack-year smoking history. He has never used smokeless tobacco. He reports that he does not currently use alcohol. He reports that he does not use drugs. ?Family History: family history includes Breast cancer (age of onset: 78) in his daughter; Cancer in his daughter, father, and mother; Colon cancer in his paternal grandmother; Hashimoto's thyroiditis in his daughter; Healthy in his brother; Ovarian cancer (age of onset: 60) in his mother; Prostate cancer in his father; Stomach cancer in his cousin. ? ? ?  HOME MEDICATIONS: ?Allergies as of 06/11/2021   ? ?   Reactions  ? Lisinopril Cough  ? ?  ? ?  ?Medication List  ?  ? ?  ? Accurate as of June 11, 2021  4:26 PM. If you have any questions, ask your nurse or doctor.  ?  ?  ? ?  ? ?Azelastine HCl 137 MCG/SPRAY Soln ?INSTILL 2 SPRAYS INTO EACH NOSTRIL TWICE A DAY AS DIRECTED ?  ?capecitabine 500 MG tablet ?Commonly known as: XELODA ?Take 3 tabs in the morning and evening, 30 minutes after meal,  for 7 days on and 7 days off ?  ?cetirizine 10 MG tablet ?Commonly known as: ZYRTEC ?Take 10 mg  by mouth daily. ?  ?dorzolamide-timolol 22.3-6.8 MG/ML ophthalmic solution ?Commonly known as: COSOPT ?Place 1 drop into the left eye 2 (two) times daily. ?  ?EPINEPHrine 0.3 mg/0.3 mL Soaj injection ?Commonly known as: EPI-PEN ?Inject 0.3 mg into the muscle as needed for anaphylaxis. ?  ?ezetimibe 10 MG tablet ?Commonly known as: ZETIA ?Take 1 tablet (10 mg total) by mouth daily. ?  ?GLUCOSAMINE-CHONDROITIN PO ?Take 1 capsule by mouth daily. ?  ?lactulose 10 GM/15ML solution ?Commonly known as: [place] ?Take 15 mLs (10 g total) by mouth daily as needed for mild constipation. ?  ?latanoprost 0.005 % ophthalmic solution ?Commonly known as: XALATAN ?Place 1 drop into the left eye at bedtime. ?  ?moxifloxacin 0.5 % ophthalmic solution ?Commonly known as: VIGAMOX ?Place one drop into the left eye 4 (four) times daily. Do not take tonight.  Bring to office tomorrow. ?  ?One-A-Day Mens 50+ Tabs ?Take 1 tablet by mouth daily. ?  ?PRESERVISION AREDS 2 PO ?Take 1 tablet by mouth 2 (two) times daily. ?  ?Rhopressa 0.02 % Soln ?Generic drug: Netarsudil Dimesylate ?Place 1 drop into the left eye daily. ?  ?spironolactone 50 MG tablet ?Commonly known as: ALDACTONE ?Take 1 tablet (50 mg total) by mouth daily. ?  ?sucralfate 1 g tablet ?Commonly known as: Carafate ?Take 1 tablet (1 g total) by mouth 4 (four) times daily. Dissolve each tablet in 15 cc water before use. ?  ? ?  ?  ? ? ?REVIEW OF SYSTEMS: ?A comprehensive ROS was conducted with the patient and is negative except as per HPI  ? ? ? ?OBJECTIVE:  ?VS: BP (!) 110/56   Pulse 76   Ht '5\' 11"'$  (1.803 m)   Wt 170 lb 3.2 oz (77.2 kg)   SpO2 97%   BMI 23.74 kg/m?   ? ?Wt Readings from Last 3 Encounters:  ?06/11/21 170 lb 3.2 oz (77.2 kg)  ?06/05/21 169 lb (76.7 kg)  ?05/07/21 168 lb 11.2 oz (76.5 kg)  ? ? ? ?EXAM: ?General: Pt appears well and is in NAD  ?Eyes: External eye exam normal without stare, lid lag or exophthalmos.  EOM intact.  PERRL.  ?Neck: General: Supple  without adenopathy. ?Thyroid: Thyroid size normal.  No goiter or nodules appreciated.  ?Lungs: Clear with good BS bilat with no rales, rhonchi, or wheezes  ?Heart: Auscultation: RRR.  ?Abdomen: Normoactive bowel sounds, soft, nontender, without masses or organomegaly palpable  ?Extremities:  ?BL LE: No pretibial edema normal ROM and strength.  ?Mental Status: Judgment, insight: Intact ?Orientation: Oriented to time, place, and person ?Mood and affect: No depression, anxiety, or agitation  ? ? ? ?DATA REVIEWED: ? Latest Reference Range & Units 06/05/21 10:14  ?Sodium 135 - 145 mmol/L 137  ?Potassium 3.5 - 5.1  mmol/L 4.3  ?Chloride 98 - 111 mmol/L 107  ?CO2 22 - 32 mmol/L 24  ?Glucose 70 - 99 mg/dL 200 (H)  ?BUN 8 - 23 mg/dL 20  ?Creatinine 0.61 - 1.24 mg/dL 1.01  ?Calcium 8.9 - 10.3 mg/dL 9.2  ?Anion gap 5 - 15  6  ?Alkaline Phosphatase 38 - 126 U/L 84  ?Albumin 3.5 - 5.0 g/dL 3.7  ?AST 15 - 41 U/L 22  ?ALT 0 - 44 U/L 9  ?Total Protein 6.5 - 8.1 g/dL 6.4 (L)  ?Total Bilirubin 0.3 - 1.2 mg/dL 0.8  ?GFR, Est Non African American >60 mL/min >60  ? ? Latest Reference Range & Units 06/05/21 10:14  ?Iron 45 - 182 ug/dL 61  ?UIBC 117 - 376 ug/dL 330  ?TIBC 250 - 450 ug/dL 391  ?Saturation Ratios 17.9 - 39.5 % 16 (L)  ?Ferritin 24 - 336 ng/mL 30  ? ? ? Latest Reference Range & Units 05/07/21 09:57 05/07/21 09:59 06/05/21 10:14  ?TSH 0.320 - 4.118 uIU/mL  <0.080 (L) 0.788  ?Thyroxine (T4) 4.5 - 12.0 ug/dL 17.5 (H)  11.5  ? ? ?  ? ?ASSESSMENT/PLAN/RECOMMENDATIONS:  ? ?Nivolumab induced hyperthyroidism: ? ?-Patient is biochemically euthyroid ?-No local neck symptoms ?-Given family history of thyroid disease, he is at high risk for developing immune mediated thyroid destruction. ?-He will restart nivolumab next month, we will monitor his TFTs and intervene when necessary ?-We discussed pathophysiology of  hyperthyroidism, we also discussed increased risk of cardiac arrhythmia as well as increased bone resorption ?- We discussed  with pt the benefits of methimazole in the Tx of hyperthyroidism, as well as the possible side effects/complications of anti-thyroid drug Tx (specifically detailing the rare, but serious side effect of agranulocytosis). H

## 2021-06-30 ENCOUNTER — Other Ambulatory Visit: Payer: Self-pay

## 2021-06-30 ENCOUNTER — Ambulatory Visit (HOSPITAL_COMMUNITY)
Admission: RE | Admit: 2021-06-30 | Discharge: 2021-06-30 | Disposition: A | Discharge status: Home / Self Care | Payer: Medicare Other | Source: Ambulatory Visit | Attending: Hematology | Admitting: Hematology

## 2021-06-30 ENCOUNTER — Other Ambulatory Visit: Payer: Medicare Other

## 2021-06-30 ENCOUNTER — Inpatient Hospital Stay: Payer: Medicare Other | Attending: Nurse Practitioner

## 2021-06-30 DIAGNOSIS — E0590 Thyrotoxicosis, unspecified without thyrotoxic crisis or storm: Secondary | ICD-10-CM | POA: Diagnosis not present

## 2021-06-30 DIAGNOSIS — C155 Malignant neoplasm of lower third of esophagus: Secondary | ICD-10-CM | POA: Insufficient documentation

## 2021-06-30 DIAGNOSIS — I700 Atherosclerosis of aorta: Secondary | ICD-10-CM | POA: Insufficient documentation

## 2021-06-30 DIAGNOSIS — Z5111 Encounter for antineoplastic chemotherapy: Secondary | ICD-10-CM | POA: Insufficient documentation

## 2021-06-30 DIAGNOSIS — R109 Unspecified abdominal pain: Secondary | ICD-10-CM | POA: Insufficient documentation

## 2021-06-30 DIAGNOSIS — C7972 Secondary malignant neoplasm of left adrenal gland: Secondary | ICD-10-CM | POA: Insufficient documentation

## 2021-06-30 DIAGNOSIS — R590 Localized enlarged lymph nodes: Secondary | ICD-10-CM | POA: Insufficient documentation

## 2021-06-30 DIAGNOSIS — I3139 Other pericardial effusion (noninflammatory): Secondary | ICD-10-CM | POA: Diagnosis not present

## 2021-06-30 DIAGNOSIS — C159 Malignant neoplasm of esophagus, unspecified: Secondary | ICD-10-CM | POA: Diagnosis not present

## 2021-06-30 DIAGNOSIS — Z79899 Other long term (current) drug therapy: Secondary | ICD-10-CM | POA: Insufficient documentation

## 2021-06-30 DIAGNOSIS — C787 Secondary malignant neoplasm of liver and intrahepatic bile duct: Secondary | ICD-10-CM | POA: Insufficient documentation

## 2021-06-30 DIAGNOSIS — H409 Unspecified glaucoma: Secondary | ICD-10-CM | POA: Insufficient documentation

## 2021-06-30 DIAGNOSIS — C778 Secondary and unspecified malignant neoplasm of lymph nodes of multiple regions: Secondary | ICD-10-CM | POA: Insufficient documentation

## 2021-06-30 DIAGNOSIS — K7460 Unspecified cirrhosis of liver: Secondary | ICD-10-CM | POA: Insufficient documentation

## 2021-06-30 LAB — CMP (CANCER CENTER ONLY)
ALT: 9 U/L (ref 0–44)
AST: 23 U/L (ref 15–41)
Albumin: 3.8 g/dL (ref 3.5–5.0)
Alkaline Phosphatase: 75 U/L (ref 38–126)
Anion gap: 6 (ref 5–15)
BUN: 21 mg/dL (ref 8–23)
CO2: 23 mmol/L (ref 22–32)
Calcium: 9 mg/dL (ref 8.9–10.3)
Chloride: 107 mmol/L (ref 98–111)
Creatinine: 1.11 mg/dL (ref 0.61–1.24)
GFR, Estimated: >60 mL/min (ref >60)
Glucose, Bld: 116 mg/dL — ABNORMAL HIGH (ref 70–99)
Potassium: 4.7 mmol/L (ref 3.5–5.1)
Sodium: 136 mmol/L (ref 135–145)
Total Bilirubin: 0.8 mg/dL (ref 0.3–1.2)
Total Protein: 7 g/dL (ref 6.5–8.1)

## 2021-06-30 LAB — IRON AND IRON BINDING CAPACITY (CC-WL,HP ONLY)
Iron: 96 ug/dL (ref 45–182)
Saturation Ratios: 23 % (ref 17.9–39.5)
TIBC: 421 ug/dL (ref 250–450)
UIBC: 325 ug/dL (ref 117–376)

## 2021-06-30 LAB — CBC WITH DIFFERENTIAL (CANCER CENTER ONLY)
Abs Immature Granulocytes: 0.01 10*3/uL (ref 0.00–0.07)
Basophils Absolute: 0.1 10*3/uL (ref 0.0–0.1)
Basophils Relative: 1 %
Eosinophils Absolute: 0.1 10*3/uL (ref 0.0–0.5)
Eosinophils Relative: 2 %
HCT: 35.9 % — ABNORMAL LOW (ref 39.0–52.0)
Hemoglobin: 11.9 g/dL — ABNORMAL LOW (ref 13.0–17.0)
Immature Granulocytes: 0 %
Lymphocytes Relative: 16 %
Lymphs Abs: 0.9 10*3/uL (ref 0.7–4.0)
MCH: 34.4 pg — ABNORMAL HIGH (ref 26.0–34.0)
MCHC: 33.1 g/dL (ref 30.0–36.0)
MCV: 103.8 fL — ABNORMAL HIGH (ref 80.0–100.0)
Monocytes Absolute: 0.5 10*3/uL (ref 0.1–1.0)
Monocytes Relative: 9 %
Neutro Abs: 3.9 10*3/uL (ref 1.7–7.7)
Neutrophils Relative %: 72 %
Platelet Count: 168 10*3/uL (ref 150–400)
RBC: 3.46 MIL/uL — ABNORMAL LOW (ref 4.22–5.81)
RDW: 19.2 % — ABNORMAL HIGH (ref 11.5–15.5)
WBC Count: 5.5 10*3/uL (ref 4.0–10.5)
nRBC: 0 % (ref 0.0–0.2)

## 2021-06-30 LAB — FERRITIN: Ferritin: 36 ng/mL (ref 24–336)

## 2021-06-30 MED ORDER — IOHEXOL 300 MG/ML  SOLN
100.0000 mL | Freq: Once | INTRAMUSCULAR | Status: AC | PRN
  Administered 2021-06-30: 100 mL via INTRAVENOUS

## 2021-06-30 MED ORDER — SODIUM CHLORIDE (PF) 0.9 % IJ SOLN
INTRAMUSCULAR | Status: AC
  Filled 2021-06-30: qty 50

## 2021-07-01 LAB — T4: T4, Total: 4.8 ug/dL (ref 4.5–12.0)

## 2021-07-02 ENCOUNTER — Other Ambulatory Visit: Payer: Self-pay

## 2021-07-02 ENCOUNTER — Inpatient Hospital Stay: Payer: Medicare Other

## 2021-07-02 ENCOUNTER — Inpatient Hospital Stay (HOSPITAL_BASED_OUTPATIENT_CLINIC_OR_DEPARTMENT_OTHER): Payer: Medicare Other | Admitting: Hematology

## 2021-07-02 VITALS — BP 131/57 | HR 76 | Temp 98.3°F | Resp 18 | Ht 71.0 in | Wt 176.0 lb

## 2021-07-02 DIAGNOSIS — K7460 Unspecified cirrhosis of liver: Secondary | ICD-10-CM | POA: Diagnosis not present

## 2021-07-02 DIAGNOSIS — C778 Secondary and unspecified malignant neoplasm of lymph nodes of multiple regions: Secondary | ICD-10-CM | POA: Diagnosis not present

## 2021-07-02 DIAGNOSIS — H409 Unspecified glaucoma: Secondary | ICD-10-CM | POA: Diagnosis not present

## 2021-07-02 DIAGNOSIS — R109 Unspecified abdominal pain: Secondary | ICD-10-CM | POA: Diagnosis not present

## 2021-07-02 DIAGNOSIS — Z5111 Encounter for antineoplastic chemotherapy: Secondary | ICD-10-CM | POA: Diagnosis not present

## 2021-07-02 DIAGNOSIS — C155 Malignant neoplasm of lower third of esophagus: Secondary | ICD-10-CM | POA: Diagnosis not present

## 2021-07-02 DIAGNOSIS — Z79899 Other long term (current) drug therapy: Secondary | ICD-10-CM | POA: Diagnosis not present

## 2021-07-02 DIAGNOSIS — C787 Secondary malignant neoplasm of liver and intrahepatic bile duct: Secondary | ICD-10-CM | POA: Diagnosis not present

## 2021-07-02 DIAGNOSIS — E0590 Thyrotoxicosis, unspecified without thyrotoxic crisis or storm: Secondary | ICD-10-CM | POA: Diagnosis not present

## 2021-07-02 NOTE — Progress Notes (Signed)
?[facility]   ?[phone] [phone]   ?Clinic Follow up Note  ? ?Patient Care Team: ?[name], [name], MD as PCP - General (Family Medicine) ?[name], MD as Consulting Physician (Gastroenterology) ?[name], MD as Consulting Physician (Hematology) ?[name], NP as Nurse Practitioner (Nurse Practitioner) ?[name], RN as Equities trader ? ?Date of Service:  07/02/2021 ? ?CHIEF COMPLAINT: f/u of esophageal cancer ? ?CURRENT THERAPY:  ?PENDING Taxol ? ?ASSESSMENT & PLAN:  ?Randy Wood is a 81 y.o. male with  ? ?1. 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  ?-PET 12/03/20 showed: esophageal tumor with local perigastric infiltration; hepatic and nodal metastasis involving the chest and abdomen extending to lower retroperitoneum; burst fracture at L5 with increased metabolic activity ?-Molecular testing showed MMR normal, PD-L1 CPS 3% ?-He received palliative radiation to the primary tumor 9/15 - 12/27/20 ?-He began single agent Xeloda 1 week on/1 week off and nivolumab every 2 weeks on 01/07/21, tolerating well ?-his thyroid function has became abnormal on nivolumab. We will hold for today, continue Xeloda, and he will meet Dr. Kelton Pillar next week.  ?-restaging CT on 06/30/21 showed worsening disease within nodes in chest and abdomen, liver, adrenal gland, and retroperitoneal nodes. I reviewed the results with them today. ?-I discussed switching treatment with them today, including FOLFOX or taxol and bevacizumab.  Given his advanced age and medical comorbidities, I recommend switching to weekly taxol and ramucirumab.  I also discussed port  placement. ?--Chemotherapy consent: Side effects including but does not not limited to, fatigue, nausea, vomiting, diarrhea, hair loss, neuropathy, fluid retention, renal and kidney dysfunction, neutropenic fever, needed for blood transfusion, bleeding, thrombosis, hypertension, proteinuria, small risk of bowel perforation, were discussed with patient in great detail. He agrees to proceed. ?-The goal of therapy is palliative, for disease control and prolong his life. ?-we will plan to start in 2 weeks. ?  ?2. Hyperthyroidism  ?-secondary to nivolumab ?-initially noted on 05/07/21 labs-- T4 17.5 and TSH <0.08. he has not symptoms of hyperthyroidism  ?-he established care with Dr. Kelton Pillar on 06/11/21. ?  ?3. Cirrhosis, abdominal pain ?-felt to be alcohol-related. He has abstained from alcohol since 2020/06/05 ?-S/p paracentesis 05/09/20 yielding 4.4 liters, path showed reactive mesothelial cells ?-Compensated, hepatic function panel 09/17/20 WNL ?-Continue spironolactone.  Hold Lasix ?-followed by GI ?  ?4. Social support ?-He is widowed and lives alone, his wife died from [place] in 06-06-15, she was treated here ?-2 daughters live out of town, [name] lives in [place] and is the closest, also his primary contact for healthcare ?-He has church community and some family friends who could help if needed ?-He has met outpatient palliative care ?-Patient is independent with ADLs but has a Secretary/administrator and yard service.  ?  ?5. Family history  ?-Patient's mother had ovarian cancer, father had prostate cancer, and a daughter has breast cancer at age 28 ?-His daughter [name] without cancer underwent genetic testing and is negative ?-He was referred to genetics and underwent testing 12/09/20, results were negative ?  ?  ?PLAN: ?-will discontinue Xeloda and Nivo ?-port placement in next 2 weeks ?-lab, flush, f/u, and start weekly taxol and ramucirumab in 2 weeks ? ? ?No problem-specific  Assessment & Plan notes found for this  encounter. ? ? ?SUMMARY OF ONCOLOGIC HISTORY: ?Oncology History Overview Note  ?Cancer Staging ?No matching staging information was found for the patient. ? ?  ?Primary adenocarcinoma of distal third of esophagus ([place])  ?11/11/2020 Procedure  ? EGD by Dr. Watt Climes ?- Normal larynx. ?-? Malignant-appearing esophageal stenosis. Biopsied. ?- Pills were found in the esophagus. Removal was successful. ?- Rule out malignancy,? gastric tumor in the cardia difficult to say based on bleeding from ?passing the scope and unable to wash and suction for adequate visualization. ?- Normal ampulla, duodenal bulb, first portion of the duodenum, second portion of the ?duodenum, major papilla and area of the papilla. ?- The examination was otherwise normal. ?  ?11/11/2020 Initial Biopsy  ? FINAL MICROSCOPIC DIAGNOSIS:  ?A. ESOPHAGUS, DISTAL, BIOPSY:  ?-  Poorly differentiated adenocarcinoma  ?-  See comment  ?  ?11/11/2020 Initial Diagnosis  ? Primary adenocarcinoma of distal third of esophagus ([place]) ?  ?11/11/2020 Cancer Staging  ? Staging form: Esophagus - Adenocarcinoma, AJCC 8th Edition ?- Clinical stage from 11/11/2020: Stage IVB (cTX, cN1, cM1) - Signed by [name], MD on 12/26/2020 ? ?  ?11/14/2020 Imaging  ? CT CAPIMPRESSION: ?1. Aggressive appearing new tumor of the gastroesophageal junction ?confluent with the irregular collection of right gastric lymph ?nodes, and with new associated metastatic retroperitoneal ?adenopathy. Small paraesophageal lymph nodes in the thorax are ?increased in size from prior exam and could also be involved, and ?there is a nonspecific 1.0 cm lesion inferiorly in the right hepatic ?lobe which could potentially be metastatic. ?2. New subacute burst (AO type A4) fracture of L5, with mild ?posterior bony retropulsion and with a dominant coronally oriented ?fracture plane, and about 50% loss of height. Correlate with ?interval trauma. ?3. Slight thickening along the left paracolic gutter and trace ?pelvic  ascites, although the ascites is markedly reduced compared to ?05/04/2020. ?4. Other imaging findings of potential clinical significance: Aortic ?Atherosclerosis (ICD10-I70.0). Coronary atherosclerosis. Old ?compression fracture at L2. ?  ?12/03/2020 PET scan  ? IMPRESSION: ?Tumor at the GE junction with local perigastric infiltration, ?hepatic and nodal metastases. ?  ?Nodal involvement both in the chest and abdomen extending to the ?lower retroperitoneum in the abdomen. ?  ?Burst fracture at L5 shows signs of increased metabolic activity, ?nonspecific in the setting of fracture, in light of other findings ?would correlate with any history of trauma, if no history of trauma ?consider MRI to exclude the possibility of pathologic fracture. ?  ?12/27/2020 Genetic Testing  ? Negative hereditary cancer genetic testing: no pathogenic variants detected in [facility] Multi-Cancer +RNA Panel.  The report date is December 27, 2020.  ? ?The Multi-Cancer + RNA Panel offered by [facility] includes sequencing and/or deletion/duplication analysis of the following 84 genes:  AIP*, ALK, APC*, ATM*, AXIN2*, BAP1*, BARD1*, BLM*, BMPR1A*, BRCA1*, BRCA2*, BRIP1*, CASR, CDC73*, CDH1*, CDK4, CDKN1B*, CDKN1C*, CDKN2A, CEBPA, CHEK2*, CTNNA1*, DICER1*, DIS3L2*, EGFR, EPCAM, FH*, FLCN*, GATA2*, GPC3, GREM1, HOXB13, HRAS, KIT, MAX*, MEN1*, MET, MITF, MLH1*, MSH2*, MSH3*, MSH6*, MUTYH*, NBN*, NF1*, NF2*, NTHL1*, PALB2*, PDGFRA, PHOX2B, PMS2*, POLD1*, POLE*, POT1*, PRKAR1A*, PTCH1*, PTEN*, RAD50*, RAD51C*, RAD51D*, RB1*, RECQL4, RET, RUNX1*, SDHA*, SDHAF2*, SDHB*, SDHC*, SDHD*, SMAD4*, SMARCA4*, SMARCB1*, SMARCE1*, STK11*, SUFU*, TERC, TERT, TMEM127*, Tp53*, TSC1*, TSC2*, VHL*, WRN*, and WT1.  RNA analysis is performed for * genes. ?  ?01/07/2021 - 05/07/2021 Chemotherapy  ? Patient is on Treatment Plan : GASTROESOPHAGEAL Nivolumab q14d x 8 cycles / Nivolumab q28d  ?   ?03/26/2021  Imaging  ? CT CAP IMPRESSION: ?1. Evidence of treatment response. There is  decreased size of the irregular infiltrative mass centered at the gastroesophageal junction as well as decreased size of numerous metastatic ?retroperitoneal and upper abdominal lymph nodes. ?2. 2.1 cm hepatic metastasis at the infer

## 2021-07-02 NOTE — Progress Notes (Signed)
DISCONTINUE OFF PATHWAY REGIMEN - Gastroesophageal ? ? ?[id number]:Nivolumab 240 mg IV D1 q14 Days x 8 Cycles Followed by Nivolumab 480 mg IV D1 q28 Days: ?  Cycles 1 through 8: A cycle is every 14 days: ?    Nivolumab  ?  Cycles 9 and beyond: A cycle is every 28 days: ?    Nivolumab  ? ?**Always confirm dose/schedule in your pharmacy ordering system** ? ?REASON: Disease Progression ?PRIOR TREATMENT: Off Pathway: Nivolumab 240 mg IV D1 q14 Days x 8 Cycles Followed by Nivolumab 480 mg IV D1 q28 Days ?TREATMENT RESPONSE: Partial Response (PR) ? ?START ON PATHWAY REGIMEN - Gastroesophageal ? ? ?  A cycle is every 28 days: ?    Ramucirumab  ?    Paclitaxel  ? ?**Always confirm dose/schedule in your pharmacy ordering system** ? ?Patient Characteristics: ?Distant Metastases (cM1/pM1) / Locally Recurrent Disease, Adenocarcinoma - Esophageal, GE Junction, and Gastric, Second Line, MSS/pMMR or MSI Unknown ?Histology: Adenocarcinoma ?Disease Classification: GE Junction ?Therapeutic Status: Distant Metastases (No Additional Staging) ?Line of Therapy: Second Line ?Microsatellite/Mismatch Repair Status: MSS/pMMR ?Intent of Therapy: ?Non-Curative / Palliative Intent, Discussed with Patient ?

## 2021-07-04 ENCOUNTER — Telehealth: Payer: Self-pay | Admitting: Hematology

## 2021-07-04 NOTE — Telephone Encounter (Signed)
Scheduled follow-up appointments per 4/5 los. Patient is aware. ?

## 2021-07-08 ENCOUNTER — Encounter (HOSPITAL_BASED_OUTPATIENT_CLINIC_OR_DEPARTMENT_OTHER): Payer: Self-pay | Admitting: Family Medicine

## 2021-07-08 ENCOUNTER — Ambulatory Visit (INDEPENDENT_AMBULATORY_CARE_PROVIDER_SITE_OTHER): Payer: Medicare Other | Admitting: Family Medicine

## 2021-07-08 VITALS — BP 139/68 | HR 70 | Temp 97.7°F | Ht 71.0 in | Wt 176.8 lb

## 2021-07-08 DIAGNOSIS — J3089 Other allergic rhinitis: Secondary | ICD-10-CM

## 2021-07-08 DIAGNOSIS — K5900 Constipation, unspecified: Secondary | ICD-10-CM | POA: Insufficient documentation

## 2021-07-08 DIAGNOSIS — Z860100 Personal history of colon polyps, unspecified: Secondary | ICD-10-CM | POA: Insufficient documentation

## 2021-07-08 DIAGNOSIS — R1310 Dysphagia, unspecified: Secondary | ICD-10-CM | POA: Insufficient documentation

## 2021-07-08 NOTE — Patient Instructions (Signed)
Postnasal Drip ?Postnasal drip is the feeling of mucus going down the back of your throat. Mucus is a slimy substance that moistens and cleans your nose and throat, as well as the air pockets in face bones near your forehead and cheeks (sinuses). Small amounts of mucus pass from your nose and sinuses down the back of your throat all the time. This is normal. When you produce too much mucus or the mucus gets too thick, you can feel it. ?Some common causes of postnasal drip include: ?Having more mucus because of: ?A cold or the flu. ?Allergies. ?Cold air. ?Certain medicines. ?Having more mucus that is thicker because of: ?A sinus or nasal infection. ?Dry air. ?A food allergy. ?Follow these instructions at home: ?Relieving discomfort ? ?Gargle with a salt-water mixture 3-4 times a day or as needed. To make a salt-water mixture, completely dissolve ?-1 tsp of salt in 1 cup of warm water. ?If the air in your home is dry, use a humidifier to add moisture to the air. ?Use a saline spray or container (neti pot) to flush out the nose (nasal irrigation). These methods can help clear away mucus and keep the nasal passages moist. ?General instructions ?Take over-the-counter and prescription medicines only as told by your health care provider. ?Follow instructions from your health care provider about eating or drinking restrictions. You may need to avoid caffeine. ?Avoid things that you know you are allergic to (allergens), like dust, mold, pollen, pets, or certain foods. ?Drink enough fluid to keep your urine pale yellow. ?Keep all follow-up visits as told by your health care provider. This is important. ?Contact a health care provider if: ?You have a fever. ?You have a sore throat. ?You have difficulty swallowing. ?You have headache. ?You have sinus pain. ?You have a cough that does not go away. ?The mucus from your nose becomes thick and is green or yellow in color. ?You have cold or flu symptoms that last more than 10  days. ?Summary ?Postnasal drip is the feeling of mucus going down the back of your throat. ?If your health care provider approves, use nasal irrigation or a nasal spray 2?4 times a day. ?Avoid things that you know you are allergic to (allergens), like dust, mold, pollen, pets, or certain foods. ?This information is not intended to replace advice given to you by your health care provider. Make sure you discuss any questions you have with your health care provider. ?Document Revised: 12/26/2019 Document Reviewed: 12/26/2019 ?Elsevier Patient Education ? 2022 Elsevier Inc. ? ?

## 2021-07-08 NOTE — Assessment & Plan Note (Signed)
Patient indicates that he has been doing well with use of Zyrtec and azelastine.  He was switched to different nasal spray from Flonase due to history of glaucoma.  Feels that symptoms are generally well controlled, still with occasional rhinorrhea but is currently satisfied with control of symptoms.  Not desiring referral to ENT today ?

## 2021-07-08 NOTE — Progress Notes (Signed)
? ? ?  Procedures performed today:   ? ?None. ? ?Independent interpretation of notes and tests performed by another provider:  ? ?None. ? ?Brief History, Exam, Impression, and Recommendations:   ? ?BP 139/68   Pulse 70   Temp 97.7 ?F (36.5 ?C)   Ht '5\' 11"'$  (1.803 m)   Wt 176 lb 12.8 oz (80.2 kg)   SpO2 100%   BMI 24.66 kg/m?  ? ?Perennial allergic rhinitis ?Patient indicates that he has been doing well with use of Zyrtec and azelastine.  He was switched to different nasal spray from Flonase due to history of glaucoma.  Feels that symptoms are generally well controlled, still with occasional rhinorrhea but is currently satisfied with control of symptoms.  Not desiring referral to ENT today ? ?Plan for follow-up in about 6 months for CPE.  We will review recent labs done with other providers and determine if any additional labs are needed at time of next visit. ? ? ?___________________________________________ ?Reality Dejonge [name], MD, ABFM, CAQSM ?Primary Care and Sports Medicine ?[facility] ?

## 2021-07-10 ENCOUNTER — Other Ambulatory Visit: Payer: Self-pay | Admitting: Hematology

## 2021-07-10 NOTE — Progress Notes (Signed)
Pharmacist Chemotherapy Monitoring - Initial Assessment   ? ?Anticipated start date: 07/17/21   ? ?The following has been reviewed per standard work regarding the patient's treatment regimen: ?The patient's diagnosis, treatment plan and drug doses, and organ/hematologic function ?Lab orders and baseline tests specific to treatment regimen  ?The treatment plan start date, drug sequencing, and pre-medications ?Prior authorization status  ?Patient's documented medication list, including drug-drug interaction screen and prescriptions for anti-emetics and supportive care specific to the treatment regimen ?The drug concentrations, fluid compatibility, administration routes, and timing of the medications to be used ?The patient's access for treatment and lifetime cumulative dose history, if applicable  ?The patient's medication allergies and previous infusion related reactions, if applicable  ? ?Changes made to treatment plan:  ?treatment plan date ? ?Follow up needed:  ?N/A ? ? ?Randy Wood, [place], ?07/10/2021  2:18 PM  ?

## 2021-07-14 ENCOUNTER — Encounter (INDEPENDENT_AMBULATORY_CARE_PROVIDER_SITE_OTHER): Payer: Medicare Other | Admitting: Ophthalmology

## 2021-07-14 DIAGNOSIS — H43813 Vitreous degeneration, bilateral: Secondary | ICD-10-CM

## 2021-07-14 DIAGNOSIS — I10 Essential (primary) hypertension: Secondary | ICD-10-CM

## 2021-07-14 DIAGNOSIS — H353211 Exudative age-related macular degeneration, right eye, with active choroidal neovascularization: Secondary | ICD-10-CM

## 2021-07-14 DIAGNOSIS — H35033 Hypertensive retinopathy, bilateral: Secondary | ICD-10-CM | POA: Diagnosis not present

## 2021-07-14 DIAGNOSIS — H353122 Nonexudative age-related macular degeneration, left eye, intermediate dry stage: Secondary | ICD-10-CM | POA: Diagnosis not present

## 2021-07-15 ENCOUNTER — Other Ambulatory Visit: Payer: Self-pay | Admitting: Radiology

## 2021-07-15 ENCOUNTER — Other Ambulatory Visit (INDEPENDENT_AMBULATORY_CARE_PROVIDER_SITE_OTHER): Payer: Medicare Other

## 2021-07-15 DIAGNOSIS — E0590 Thyrotoxicosis, unspecified without thyrotoxic crisis or storm: Secondary | ICD-10-CM | POA: Diagnosis not present

## 2021-07-15 LAB — TSH: TSH: 70.63 u[IU]/mL — ABNORMAL HIGH (ref 0.35–5.50)

## 2021-07-15 LAB — T4, FREE: Free T4: 0.31 ng/dL — ABNORMAL LOW (ref 0.60–1.60)

## 2021-07-15 NOTE — Progress Notes (Addendum)
[facility] ?OFFICE PROGRESS NOTE ? ?[name], [name], MD ?[phone] ?[place] [address] ? ?DIAGNOSIS: f/u of esophageal cancer ? ?Oncology History Overview Note  ?Cancer Staging ?No matching staging information was found for the patient. ? ?  ?Primary adenocarcinoma of distal third of esophagus ([place])  ?11/11/2020 Procedure  ? EGD by Dr. Watt Climes ?- Normal larynx. ?-? Malignant-appearing esophageal stenosis. Biopsied. ?- Pills were found in the esophagus. Removal was successful. ?- Rule out malignancy,? gastric tumor in the cardia difficult to say based on bleeding from ?passing the scope and unable to wash and suction for adequate visualization. ?- Normal ampulla, duodenal bulb, first portion of the duodenum, second portion of the ?duodenum, major papilla and area of the papilla. ?- The examination was otherwise normal. ?  ?11/11/2020 Initial Biopsy  ? FINAL MICROSCOPIC DIAGNOSIS:  ?A. ESOPHAGUS, DISTAL, BIOPSY:  ?-  Poorly differentiated adenocarcinoma  ?-  See comment  ?  ?11/11/2020 Initial Diagnosis  ? Primary adenocarcinoma of distal third of esophagus ([place]) ?  ?11/11/2020 Cancer Staging  ? Staging form: Esophagus - Adenocarcinoma, AJCC 8th Edition ?- Clinical stage from 11/11/2020: Stage IVB (cTX, cN1, cM1) - Signed by [name], MD on 12/26/2020 ? ?  ?11/14/2020 Imaging  ? CT CAPIMPRESSION: ?1. Aggressive appearing new tumor of the gastroesophageal junction ?confluent with the irregular collection of right gastric lymph ?nodes, and with new associated metastatic retroperitoneal ?adenopathy. Small paraesophageal lymph nodes in the thorax are ?increased in size from prior exam and could also be involved, and ?there is a nonspecific 1.0 cm lesion inferiorly in the right hepatic ?lobe which could potentially be metastatic. ?2. New subacute burst (AO type A4) fracture of L5, with mild ?posterior bony retropulsion and with a dominant coronally oriented ?fracture plane, and about 50% loss of  height. Correlate with ?interval trauma. ?3. Slight thickening along the left paracolic gutter and trace ?pelvic ascites, although the ascites is markedly reduced compared to ?05/04/2020. ?4. Other imaging findings of potential clinical significance: Aortic ?Atherosclerosis (ICD10-I70.0). Coronary atherosclerosis. Old ?compression fracture at L2. ?  ?12/03/2020 PET scan  ? IMPRESSION: ?Tumor at the GE junction with local perigastric infiltration, ?hepatic and nodal metastases. ?  ?Nodal involvement both in the chest and abdomen extending to the ?lower retroperitoneum in the abdomen. ?  ?Burst fracture at L5 shows signs of increased metabolic activity, ?nonspecific in the setting of fracture, in light of other findings ?would correlate with any history of trauma, if no history of trauma ?consider MRI to exclude the possibility of pathologic fracture. ?  ?12/27/2020 Genetic Testing  ? Negative hereditary cancer genetic testing: no pathogenic variants detected in [facility] Multi-Cancer +RNA Panel.  The report date is December 27, 2020.  ? ?The Multi-Cancer + RNA Panel offered by [facility] includes sequencing and/or deletion/duplication analysis of the following 84 genes:  AIP*, ALK, APC*, ATM*, AXIN2*, BAP1*, BARD1*, BLM*, BMPR1A*, BRCA1*, BRCA2*, BRIP1*, CASR, CDC73*, CDH1*, CDK4, CDKN1B*, CDKN1C*, CDKN2A, CEBPA, CHEK2*, CTNNA1*, DICER1*, DIS3L2*, EGFR, EPCAM, FH*, FLCN*, GATA2*, GPC3, GREM1, HOXB13, HRAS, KIT, MAX*, MEN1*, MET, MITF, MLH1*, MSH2*, MSH3*, MSH6*, MUTYH*, NBN*, NF1*, NF2*, NTHL1*, PALB2*, PDGFRA, PHOX2B, PMS2*, POLD1*, POLE*, POT1*, PRKAR1A*, PTCH1*, PTEN*, RAD50*, RAD51C*, RAD51D*, RB1*, RECQL4, RET, RUNX1*, SDHA*, SDHAF2*, SDHB*, SDHC*, SDHD*, SMAD4*, SMARCA4*, SMARCB1*, SMARCE1*, STK11*, SUFU*, TERC, TERT, TMEM127*, Tp53*, TSC1*, TSC2*, VHL*, WRN*, and WT1.  RNA analysis is performed for * genes. ?  ?01/07/2021 - 05/07/2021 Chemotherapy  ? Patient is on Treatment Plan : GASTROESOPHAGEAL Nivolumab q14d  x 8  cycles / Nivolumab q28d  ? ?  ?  ?03/26/2021 Imaging  ? CT CAP IMPRESSION: ?1. Evidence of treatment response. There is decreased size of the irregular infiltrative mass centered at the gastroesophageal junction as well as decreased size of numerous metastatic ?retroperitoneal and upper abdominal lymph nodes. ?2. 2.1 cm hepatic metastasis at the inferior right hepatic lobe segment 6 is stable to slightly increased in size since previous PET-CT. No new hepatic mass visualized. ?3. A few new adjacent small pulmonary nodules in the posterior left lower lobe measuring up to 4 mm in size. Continued follow-up recommended. ?4. Small right pleural effusion which is new since previous study. Small pericardial effusion increased since previous study. ?5. Other ancillary findings as described. ?  ?  ?06/30/2021 Imaging  ? EXAM: ?CT CHEST, ABDOMEN, AND PELVIS WITH CONTRAST ? ?IMPRESSION: ?1. Worsening nodal disease in the chest and abdomen more so within the abdomen as described. ?2. Increasing size of RIGHT pleural fluid and pericardial effusion. ?Now moderate. ?3. Increasing size of RIGHT hepatic lesion. ?4. New LEFT adrenal metastasis. ?5. Increasing size of retroperitoneal adenopathy. ?6. Obstruction of the RIGHT ureter may be related to a metastatic ?lesion to the RIGHT ureter, blood clot or primary urothelial lesion ?and is associated with mild hydronephrosis, moderate ureteral ?dilation and potential filling defect also in lower pole calyces of ?the RIGHT kidney. ?7. Trace perihepatic ascites. ?8. Aortic atherosclerosis. ?  ?07/17/2021 -  Chemotherapy  ? Patient is on Treatment Plan : GASTROESOPHAGEAL Ramucirumab D1, 15  / PACLitaxel D1,8,15 q28d  ? ?  ?  ? ? ?CURRENT THERAPY: weekly taxol and ramucirumab first dose expected today.  We will hold Cyramza from day 1 cycle 1 due to port placement on 07/16/2021.  ? ?INTERVAL HISTORY: ?Randy Wood 81 y.o. male returns to the clinic today for a follow-up visit accompanied by  his daughters.  The patient was recently found to have evidence of disease progression, therefore given his advanced age and medical comorbidities Dr. Burr Medico recommended changing his treatment to weekly Taxol and Cyramza.  He is here for his first cycle of this treatment today.  He does not have any questions but his daughters are wondering ifhe can be prescribed an anti-emetic. As we were discussing his pre-medications, his daughters mentioned that his Flonase was discontinued by his glaucoma specialist due to it increasing his intraocular pressure. They believe it is because proximity to his eye with the nasal spray. They are wondering if decadron is ok as a pre-medication. He just had his pressure checked which was normal and his follow up pressure check is on 08/18/21, which is every 5 weeks. He sees a Dr. Ander Slade at a [facility] practice in [place].  Overall, he denies any new concerns today.  Denies any fever, chills, or night sweats.  He reports he is eating well.  Denies any pain, bloating, or abdominal pain.  Denies any unexplained weight loss since last being seen.  Denies any dysphagia or odynophagia.  Denies any nausea, vomiting, diarrhea, or constipation.  Denies any chest pain, shortness of breath, or cough.  He is here today for evaluation and repeat blood work before starting cycle #1. ? ? ?MEDICAL HISTORY: ?Past Medical History:  ?Diagnosis Date  ? AKI (acute kidney injury) ([place])   ? Alcohol abuse   ? Arthritis   ? Cataract 2006  ? Diarrhea   ? Edema leg   ? Esophageal cancer ([place])   ? Family history of  breast cancer 12/09/2020  ? Family history of ovarian cancer 12/09/2020  ? Family history of prostate cancer 12/09/2020  ? GERD (gastroesophageal reflux disease)   ? Glaucoma unknown  ? Hyperbilirubinemia   ? Hyperlipemia   ? Hypertension   ? Hypokalemia   ? Liver failure ([place])   ? Macular degeneration, wet ([place])   ? Microcytic anemia   ? Osteoarthritis   ? SOB (shortness of breath)   ? Weight gain    ? ? ?ALLERGIES:  is allergic to lisinopril. ? ?MEDICATIONS:  ?Current Outpatient Medications  ?Medication Sig Dispense Refill  ? Azelastine HCl 137 MCG/SPRAY SOLN INSTILL 2 SPRAYS INTO EACH NOSTRIL TWICE A DAY AS D

## 2021-07-16 ENCOUNTER — Encounter (HOSPITAL_COMMUNITY): Payer: Self-pay

## 2021-07-16 ENCOUNTER — Ambulatory Visit (HOSPITAL_COMMUNITY)
Admission: RE | Admit: 2021-07-16 | Discharge: 2021-07-16 | Disposition: A | Discharge status: Home / Self Care | Payer: Medicare Other | Source: Ambulatory Visit | Attending: Hematology | Admitting: Hematology

## 2021-07-16 ENCOUNTER — Telehealth: Payer: Self-pay | Admitting: Internal Medicine

## 2021-07-16 ENCOUNTER — Ambulatory Visit (HOSPITAL_BASED_OUTPATIENT_CLINIC_OR_DEPARTMENT_OTHER): Payer: Medicare Other | Admitting: Family Medicine

## 2021-07-16 ENCOUNTER — Other Ambulatory Visit: Payer: Self-pay

## 2021-07-16 DIAGNOSIS — C155 Malignant neoplasm of lower third of esophagus: Secondary | ICD-10-CM | POA: Diagnosis not present

## 2021-07-16 DIAGNOSIS — Z452 Encounter for adjustment and management of vascular access device: Secondary | ICD-10-CM | POA: Diagnosis not present

## 2021-07-16 HISTORY — PX: IR IMAGING GUIDED PORT INSERTION: IMG5740

## 2021-07-16 MED ORDER — LIDOCAINE-EPINEPHRINE 1 %-1:100000 IJ SOLN
INTRAMUSCULAR | Status: AC | PRN
Start: 2021-07-16 — End: 2021-07-16
  Administered 2021-07-16: 20 mL

## 2021-07-16 MED ORDER — SODIUM CHLORIDE 0.9 % IV SOLN: INTRAVENOUS | Status: DC

## 2021-07-16 MED ORDER — MIDAZOLAM HCL 2 MG/2ML IJ SOLN
INTRAMUSCULAR | Status: AC | PRN
  Administered 2021-07-16 (×2): 1 mg via INTRAVENOUS

## 2021-07-16 MED ORDER — LEVOTHYROXINE SODIUM 50 MCG PO TABS: 50.0000 ug | ORAL_TABLET | Freq: Every day | ORAL | 6 refills | Status: DC

## 2021-07-16 MED ORDER — FENTANYL CITRATE (PF) 100 MCG/2ML IJ SOLN
INTRAMUSCULAR | Status: AC | PRN
  Administered 2021-07-16 (×2): 50 ug via INTRAVENOUS

## 2021-07-16 MED ORDER — HEPARIN SOD (PORK) LOCK FLUSH 100 UNIT/ML IV SOLN
INTRAVENOUS | Status: AC | PRN
  Administered 2021-07-16: 500 [IU] via INTRAVENOUS

## 2021-07-16 MED ORDER — MIDAZOLAM HCL 2 MG/2ML IJ SOLN
INTRAMUSCULAR | Status: AC
Start: 2021-07-16 — End: 2021-07-16
  Filled 2021-07-16: qty 4

## 2021-07-16 MED ORDER — LIDOCAINE HCL 1 % IJ SOLN
INTRAMUSCULAR | Status: AC
  Filled 2021-07-16: qty 20

## 2021-07-16 MED ORDER — FENTANYL CITRATE (PF) 100 MCG/2ML IJ SOLN
INTRAMUSCULAR | Status: AC
  Filled 2021-07-16: qty 2

## 2021-07-16 MED ORDER — HEPARIN SOD (PORK) LOCK FLUSH 100 UNIT/ML IV SOLN
INTRAVENOUS | Status: AC
Start: 2021-07-16 — End: 2021-07-16
  Filled 2021-07-16: qty 5

## 2021-07-16 NOTE — Procedures (Signed)
Interventional Radiology Procedure Note  Procedure: Placement of a right IJ approach single lumen PowerPort.  Tip is positioned at the superior cavoatrial junction and catheter is ready for immediate use.  Complications: None Recommendations:  - Ok to shower tomorrow - Do not submerge for 7 days - Routine line care   Signed,  Jahkari Maclin S. Adriene Padula, DO   

## 2021-07-16 NOTE — H&P (Signed)
? ? ?Referring Physician(s): ?Feng,Yan ? ?Supervising Physician: Corrie Mckusick ? ?Patient Status:  [facility] ? ?Chief Complaint: ?"I'm getting a port a cath" ? ? ?Subjective: ?Patient familiar to IR service from paracentesis x2 in 2022. He has a history of esophageal cancer diagnosed in 2022 and presents now with disease progression.  He is scheduled today for Port-A-Cath placement to assist with treatment.  He currently denies fever, headache, chest pain, dyspnea, cough, abd/back pain, nausea, vomiting or bleeding. Additional hx as below.  ? ?Past Medical History:  ?Diagnosis Date  ? AKI (acute kidney injury) ([place])   ? Alcohol abuse   ? Arthritis   ? Cataract 2006  ? Diarrhea   ? Edema leg   ? Esophageal cancer ([place])   ? Family history of breast cancer 12/09/2020  ? Family history of ovarian cancer 12/09/2020  ? Family history of prostate cancer 12/09/2020  ? GERD (gastroesophageal reflux disease)   ? Glaucoma unknown  ? Hyperbilirubinemia   ? Hyperlipemia   ? Hypertension   ? Hypokalemia   ? Liver failure ([place])   ? Macular degeneration, wet ([place])   ? Microcytic anemia   ? Osteoarthritis   ? SOB (shortness of breath)   ? Weight gain   ? ?Past Surgical History:  ?Procedure Laterality Date  ? ANKLE SURGERY    ? BIOPSY  11/11/2020  ? Procedure: BIOPSY;  Surgeon: [name], MD;  Location: [facility];  Service: Gastroenterology;;  ? CATARACT EXTRACTION, BILATERAL    ? ESOPHAGOGASTRODUODENOSCOPY (EGD) WITH PROPOFOL N/A 11/11/2020  ? Procedure: ESOPHAGOGASTRODUODENOSCOPY (EGD) WITH PROPOFOL;  Surgeon: [name], MD;  Location: [facility];  Service: Gastroenterology;  Laterality: N/A;  ? EYE SURGERY  2006  ? FOREIGN BODY REMOVAL  11/11/2020  ? Procedure: FOREIGN BODY REMOVAL;  Surgeon: [name], MD;  Location: [facility];  Service: Gastroenterology;;  ? IR PARACENTESIS  05/09/2020  ? TONSILLECTOMY    ? ? ? ? ?Allergies: ?Lisinopril ? ?Medications: ?Prior to Admission medications   ?Medication Sig Start Date End  Date Taking? Authorizing Provider  ?Azelastine HCl 137 MCG/SPRAY SOLN INSTILL 2 SPRAYS INTO EACH NOSTRIL TWICE A DAY AS DIRECTED 06/08/21  Yes [name], Raymond J, MD  ?cetirizine (ZYRTEC) 10 MG tablet Take 10 mg by mouth daily.   Yes [provider]  ?dorzolamide-timolol (COSOPT) 22.3-6.8 MG/ML ophthalmic solution Place 1 drop into the left eye 2 (two) times daily.   Yes [provider]  ?ezetimibe (ZETIA) 10 MG tablet Take 1 tablet (10 mg total) by mouth daily. 04/18/21  Yes Freada Bergeron, MD  ?GLUCOSAMINE-CHONDROITIN PO Take 1 capsule by mouth daily.   Yes [provider]  ?lactulose (CHRONULAC) 10 GM/15ML solution Take 15 mLs (10 g total) by mouth daily as needed for mild constipation. 05/11/20  Yes Regalado, Belkys A, MD  ?latanoprost (XALATAN) 0.005 % ophthalmic solution Place 1 drop into the left eye at bedtime.   Yes [provider]  ?Multiple Vitamins-Minerals (ONE-A-DAY MENS 50+) TABS Take 1 tablet by mouth daily.   Yes [provider]  ?Multiple Vitamins-Minerals (PRESERVISION AREDS 2 PO) Take 1 tablet by mouth 2 (two) times daily.   Yes [provider]  ?spironolactone (ALDACTONE) 50 MG tablet TAKE ONE TABLET BY MOUTH DAILY 07/10/21  Yes [name], MD  ?sucralfate (CARAFATE) 1 g tablet Take 1 tablet (1 g total) by mouth 4 (four) times daily. Dissolve each tablet in 15 cc water before use. 12/13/20  Yes Kyung Rudd, MD  ?  capecitabine (XELODA) 500 MG tablet Take 3 tabs in the morning and evening, 30 minutes after meal,  for 7 days on and 7 days off 05/21/21   [name], MD  ?EPINEPHrine 0.3 mg/0.3 mL IJ SOAJ injection Inject 0.3 mg into the muscle as needed for anaphylaxis.    [provider]  ?moxifloxacin (VIGAMOX) 0.5 % ophthalmic solution Place one drop into the left eye 4 (four) times daily. Do not take tonight.  Bring to office tomorrow. ?Patient not taking: Reported on 07/08/2021 02/26/21   [provider]  ? ? ? ?Vital  Signs: ?BP 136/69 (BP Location: Right Arm)   Pulse 70   Temp 97.8 ?F (36.6 ?C) (Oral)   Resp 15   Ht '5\' 11"'$  (1.803 m)   Wt 176 lb (79.8 kg)   SpO2 100%   BMI 24.55 kg/m?  ? ?Physical Exam awake/alert; chest- CTA bilat; heart- RRR; abd- soft,+BS,NT;no LE edema ? ?Imaging: ?No results found. ? ?Labs: ? ?CBC: ?Recent Labs  ?  04/01/21 ?1021 05/07/21 ?0957 06/05/21 ?1014 06/30/21 ?0923  ?WBC 3.7* 4.0 3.8* 5.5  ?HGB 11.3* 11.4* 11.3* 11.9*  ?HCT 34.3* 33.9* 33.6* 35.9*  ?PLT 120* 139* 153 168  ? ? ?COAGS: ?No results for input(s): INR, APTT in the last 8760 hours. ? ?BMP: ?Recent Labs  ?  04/01/21 ?1018 05/07/21 ?0957 06/05/21 ?1014 06/30/21 ?0923  ?NA 136 138 137 136  ?K 3.8 4.1 4.3 4.7  ?CL 109 108 107 107  ?CO2 21* '23 24 23  '$ ?GLUCOSE 166* 150* 200* 116*  ?BUN '16 18 20 21  '$ ?CALCIUM 8.9 9.3 9.2 9.0  ?CREATININE 0.92 0.86 1.01 1.11  ?GFRNONAA >60 >60 >60 >60  ? ? ?LIVER FUNCTION TESTS: ?Recent Labs  ?  04/01/21 ?1018 05/07/21 ?0957 06/05/21 ?1014 06/30/21 ?0923  ?BILITOT 0.7 0.9 0.8 0.8  ?AST 19 40 22 23  ?ALT '9 20 9 9  '$ ?ALKPHOS 66 93 84 75  ?PROT 6.4* 6.8 6.4* 7.0  ?ALBUMIN 3.5 3.8 3.7 3.8  ? ? ?Assessment and Plan: ?Patient familiar to IR service from paracentesis x2 in 2022. He has a history of esophageal cancer diagnosed in 2022 and presents now with disease progression.  He is scheduled today for Port-A-Cath placement to assist with treatment. Risks and benefits of image guided port-a-catheter placement was discussed with the patient including, but not limited to bleeding, infection, pneumothorax, or fibrin sheath development and need for additional procedures. ? ?All of the patient's questions were answered, patient is agreeable to proceed. ?Consent signed and in chart. ? ? ? ?Electronically Signed: ?Autumn Messing, PA-C ?07/16/2021, 8:30 AM ? ? ?I spent a total of 20 minutes at the the patient's bedside AND on the patient's hospital floor or unit, greater than 50% of which was counseling/coordinating care  for port a cath placement ? ? ? ? ? ?

## 2021-07-16 NOTE — Discharge Instructions (Signed)

## 2021-07-16 NOTE — Telephone Encounter (Signed)
Attempted to call the patient on 07/16/2021 at 10:15 AM, no answer ? ?Left a message to check the portal ? ?Patient will be started on levothyroxine 50 mcg daily ? ? ?Mack Guise, MD ? ?[facility]  ?[facility] ?[address] ?[place], [place] [address] ?Phone: [phone] ?FAX: [phone] ? ?

## 2021-07-17 ENCOUNTER — Encounter: Payer: Self-pay | Admitting: Physician Assistant

## 2021-07-17 ENCOUNTER — Telehealth: Payer: Self-pay | Admitting: Hematology

## 2021-07-17 ENCOUNTER — Telehealth: Payer: Self-pay

## 2021-07-17 ENCOUNTER — Inpatient Hospital Stay (HOSPITAL_BASED_OUTPATIENT_CLINIC_OR_DEPARTMENT_OTHER): Payer: Medicare Other | Admitting: Physician Assistant

## 2021-07-17 ENCOUNTER — Inpatient Hospital Stay: Payer: Medicare Other

## 2021-07-17 VITALS — BP 137/71 | HR 72 | Temp 97.7°F | Resp 17

## 2021-07-17 VITALS — BP 127/65 | HR 85 | Temp 97.8°F | Resp 18 | Ht 71.0 in | Wt 176.9 lb

## 2021-07-17 DIAGNOSIS — C787 Secondary malignant neoplasm of liver and intrahepatic bile duct: Secondary | ICD-10-CM | POA: Diagnosis not present

## 2021-07-17 DIAGNOSIS — K7460 Unspecified cirrhosis of liver: Secondary | ICD-10-CM | POA: Diagnosis not present

## 2021-07-17 DIAGNOSIS — R7989 Other specified abnormal findings of blood chemistry: Secondary | ICD-10-CM

## 2021-07-17 DIAGNOSIS — E0590 Thyrotoxicosis, unspecified without thyrotoxic crisis or storm: Secondary | ICD-10-CM | POA: Diagnosis not present

## 2021-07-17 DIAGNOSIS — C155 Malignant neoplasm of lower third of esophagus: Secondary | ICD-10-CM

## 2021-07-17 DIAGNOSIS — Z5111 Encounter for antineoplastic chemotherapy: Secondary | ICD-10-CM | POA: Diagnosis not present

## 2021-07-17 DIAGNOSIS — C778 Secondary and unspecified malignant neoplasm of lymph nodes of multiple regions: Secondary | ICD-10-CM | POA: Diagnosis not present

## 2021-07-17 LAB — COMPREHENSIVE METABOLIC PANEL
ALT: 9 U/L (ref 0–44)
AST: 24 U/L (ref 15–41)
Albumin: 3.6 g/dL (ref 3.5–5.0)
Alkaline Phosphatase: 91 U/L (ref 38–126)
Anion gap: 7 (ref 5–15)
BUN: 23 mg/dL (ref 8–23)
CO2: 20 mmol/L — ABNORMAL LOW (ref 22–32)
Calcium: 8.5 mg/dL — ABNORMAL LOW (ref 8.9–10.3)
Chloride: 106 mmol/L (ref 98–111)
Creatinine, Ser: 1.59 mg/dL — ABNORMAL HIGH (ref 0.61–1.24)
GFR, Estimated: 43 mL/min — ABNORMAL LOW (ref >60)
Glucose, Bld: 178 mg/dL — ABNORMAL HIGH (ref 70–99)
Potassium: 3.9 mmol/L (ref 3.5–5.1)
Sodium: 133 mmol/L — ABNORMAL LOW (ref 135–145)
Total Bilirubin: 0.7 mg/dL (ref 0.3–1.2)
Total Protein: 7 g/dL (ref 6.5–8.1)

## 2021-07-17 LAB — CBC WITH DIFFERENTIAL/PLATELET
Abs Immature Granulocytes: 0.02 10*3/uL (ref 0.00–0.07)
Basophils Absolute: 0.1 10*3/uL (ref 0.0–0.1)
Basophils Relative: 1 %
Eosinophils Absolute: 0.2 10*3/uL (ref 0.0–0.5)
Eosinophils Relative: 3 %
HCT: 34.5 % — ABNORMAL LOW (ref 39.0–52.0)
Hemoglobin: 11.7 g/dL — ABNORMAL LOW (ref 13.0–17.0)
Immature Granulocytes: 0 %
Lymphocytes Relative: 15 %
Lymphs Abs: 0.8 10*3/uL (ref 0.7–4.0)
MCH: 34.8 pg — ABNORMAL HIGH (ref 26.0–34.0)
MCHC: 33.9 g/dL (ref 30.0–36.0)
MCV: 102.7 fL — ABNORMAL HIGH (ref 80.0–100.0)
Monocytes Absolute: 0.4 10*3/uL (ref 0.1–1.0)
Monocytes Relative: 8 %
Neutro Abs: 3.8 10*3/uL (ref 1.7–7.7)
Neutrophils Relative %: 73 %
Platelets: 154 10*3/uL (ref 150–400)
RBC: 3.36 MIL/uL — ABNORMAL LOW (ref 4.22–5.81)
RDW: 18.8 % — ABNORMAL HIGH (ref 11.5–15.5)
WBC: 5.2 10*3/uL (ref 4.0–10.5)
nRBC: 0 % (ref 0.0–0.2)

## 2021-07-17 LAB — THYROID PEROXIDASE ANTIBODY: Thyroperoxidase Ab SerPl-aCnc: 10 IU/mL — ABNORMAL HIGH (ref <9)

## 2021-07-17 LAB — TRAB (TSH RECEPTOR BINDING ANTIBODY): TRAB: <1 IU/L (ref <=2.00)

## 2021-07-17 MED ORDER — DIPHENHYDRAMINE HCL 50 MG/ML IJ SOLN
25.0000 mg | Freq: Once | INTRAMUSCULAR | Status: AC
  Administered 2021-07-17: 25 mg via INTRAVENOUS
  Filled 2021-07-17: qty 1

## 2021-07-17 MED ORDER — SODIUM CHLORIDE 0.9 % IV SOLN
80.0000 mg/m2 | Freq: Once | INTRAVENOUS | Status: AC
  Administered 2021-07-17: 162 mg via INTRAVENOUS
  Filled 2021-07-17: qty 27

## 2021-07-17 MED ORDER — SODIUM CHLORIDE 0.9 % IV SOLN: INTRAVENOUS | Status: DC

## 2021-07-17 MED ORDER — SODIUM CHLORIDE 0.9 % IV SOLN
10.0000 mg | Freq: Once | INTRAVENOUS | Status: AC
  Administered 2021-07-17: 10 mg via INTRAVENOUS
  Filled 2021-07-17: qty 10

## 2021-07-17 MED ORDER — ONDANSETRON HCL 8 MG PO TABS: 8.0000 mg | ORAL_TABLET | Freq: Three times a day (TID) | ORAL | 2 refills | Status: DC | PRN

## 2021-07-17 MED ORDER — FAMOTIDINE IN NACL 20-0.9 MG/50ML-% IV SOLN
20.0000 mg | Freq: Once | INTRAVENOUS | Status: AC
  Administered 2021-07-17: 20 mg via INTRAVENOUS
  Filled 2021-07-17: qty 50

## 2021-07-17 MED ORDER — SODIUM CHLORIDE 0.9 % IV SOLN: Freq: Once | INTRAVENOUS | Status: AC

## 2021-07-17 MED ORDER — LIDOCAINE-PRILOCAINE 2.5-2.5 % EX CREA: 1.0000 "application " | TOPICAL_CREAM | CUTANEOUS | 0 refills | Status: DC | PRN

## 2021-07-17 MED ORDER — SODIUM CHLORIDE 0.9% FLUSH
10.0000 mL | INTRAVENOUS | Status: DC | PRN
  Administered 2021-07-17: 10 mL

## 2021-07-17 MED ORDER — HEPARIN SOD (PORK) LOCK FLUSH 100 UNIT/ML IV SOLN
500.0000 [IU] | Freq: Once | INTRAVENOUS | Status: AC | PRN
  Administered 2021-07-17: 500 [IU]

## 2021-07-17 MED ORDER — PROCHLORPERAZINE MALEATE 10 MG PO TABS: 10.0000 mg | ORAL_TABLET | Freq: Four times a day (QID) | ORAL | 2 refills | Status: DC | PRN

## 2021-07-17 NOTE — Progress Notes (Signed)
Holding cyramza today per PA Cassie.  ? ?Larene Beach, PharmD ? ?

## 2021-07-17 NOTE — Patient Instructions (Signed)

## 2021-07-17 NOTE — Patient Instructions (Signed)
[facility]  Discharge Instructions: ?Thank you for choosing [facility] to provide your oncology and hematology care.  ? ?If you have a lab appointment with the [facility], please go directly to the [facility] and check in at the registration area. ?  ?Wear comfortable clothing and clothing appropriate for easy access to any Portacath or PICC line.  ? ?We strive to give you quality time with your provider. You may need to reschedule your appointment if you arrive late (15 or more minutes).  Arriving late affects you and other patients whose appointments are after yours.  Also, if you miss three or more appointments without notifying the office, you may be dismissed from the clinic at the provider?s discretion.    ?  ?For prescription refill requests, have your pharmacy contact our office and allow 72 hours for refills to be completed.   ? ?Today you received the following chemotherapy and/or immunotherapy agents paclitaxel    ?  ?To help prevent nausea and vomiting after your treatment, we encourage you to take your nausea medication as directed. ? ?BELOW ARE SYMPTOMS THAT SHOULD BE REPORTED IMMEDIATELY: ?*FEVER GREATER THAN 100.4 F (38 ?C) OR HIGHER ?*CHILLS OR SWEATING ?*NAUSEA AND VOMITING THAT IS NOT CONTROLLED WITH YOUR NAUSEA MEDICATION ?*UNUSUAL SHORTNESS OF BREATH ?*UNUSUAL BRUISING OR BLEEDING ?*URINARY PROBLEMS (pain or burning when urinating, or frequent urination) ?*BOWEL PROBLEMS (unusual diarrhea, constipation, pain near the anus) ?TENDERNESS IN MOUTH AND THROAT WITH OR WITHOUT PRESENCE OF ULCERS (sore throat, sores in mouth, or a toothache) ?UNUSUAL RASH, SWELLING OR PAIN  ?UNUSUAL VAGINAL DISCHARGE OR ITCHING  ? ?Items with * indicate a potential emergency and should be followed up as soon as possible or go to the Emergency Department if any problems should occur. ? ?Please show the CHEMOTHERAPY ALERT CARD or IMMUNOTHERAPY ALERT CARD at check-in to  the Emergency Department and triage nurse. ? ?Should you have questions after your visit or need to cancel or reschedule your appointment, please contact [facility]  Dept: [phone]  and follow the prompts.  Office hours are 8:00 a.m. to 4:30 p.m. Monday - Friday. Please note that voicemails left after 4:00 p.m. may not be returned until the following business day.  We are closed weekends and major holidays. You have access to a nurse at all times for urgent questions. Please call the main number to the clinic Dept: [phone] and follow the prompts. ? ? ?For any non-urgent questions, you may also contact your provider using MyChart. We now offer e-Visits for anyone 67 and older to request care online for non-urgent symptoms. For details visit [email or web address]. ?  ?Also download the MyChart app! Go to the app store, search "MyChart", open the app, select [facility], and log in with your MyChart username and password. ? ?Due to Covid, a mask is required upon entering the hospital/clinic. If you do not have a mask, one will be given to you upon arrival. For doctor visits, patients may have 1 support person aged 30 or older with them. For treatment visits, patients cannot have anyone with them due to current Covid guidelines and our immunocompromised population.  ? ?Paclitaxel injection ?What is this medication? ?PACLITAXEL (PAK li TAX el) is a chemotherapy drug. It targets fast dividing cells, like cancer cells, and causes these cells to die. This medicine is used to treat ovarian cancer, breast cancer, lung cancer, Kaposi's sarcoma, and other cancers. ?This medicine may  be used for other purposes; ask your health care provider or pharmacist if you have questions. ?COMMON BRAND NAME(S): Onxol, Taxol ?What should I tell my care team before I take this medication? ?They need to know if you have any of these conditions: ?history of irregular heartbeat ?liver disease ?low blood  counts, like low white cell, platelet, or red cell counts ?lung or breathing disease, like asthma ?tingling of the fingers or toes, or other nerve disorder ?an unusual or allergic reaction to paclitaxel, alcohol, polyoxyethylated castor oil, other chemotherapy, other medicines, foods, dyes, or preservatives ?pregnant or trying to get pregnant ?breast-feeding ?How should I use this medication? ?This drug is given as an infusion into a vein. It is administered in a hospital or clinic by a specially trained health care professional. ?Talk to your pediatrician regarding the use of this medicine in children. Special care may be needed. ?Overdosage: If you think you have taken too much of this medicine contact a poison control center or emergency room at once. ?NOTE: This medicine is only for you. Do not share this medicine with others. ?What if I miss a dose? ?It is important not to miss your dose. Call your doctor or health care professional if you are unable to keep an appointment. ?What may interact with this medication? ?Do not take this medicine with any of the following medications: ?live virus vaccines ?This medicine may also interact with the following medications: ?antiviral medicines for hepatitis, HIV or AIDS ?certain antibiotics like erythromycin and clarithromycin ?certain medicines for fungal infections like ketoconazole and itraconazole ?certain medicines for seizures like carbamazepine, phenobarbital, phenytoin ?gemfibrozil ?nefazodone ?rifampin ?[place]. [place]'s wort ?This list may not describe all possible interactions. Give your health care provider a list of all the medicines, herbs, non-prescription drugs, or dietary supplements you use. Also tell them if you smoke, drink alcohol, or use illegal drugs. Some items may interact with your medicine. ?What should I watch for while using this medication? ?Your condition will be monitored carefully while you are receiving this medicine. You will need important  blood work done while you are taking this medicine. ?This medicine can cause serious allergic reactions. To reduce your risk you will need to take other medicine(s) before treatment with this medicine. If you experience allergic reactions like skin rash, itching or hives, swelling of the face, lips, or tongue, tell your doctor or health care professional right away. ?In some cases, you may be given additional medicines to help with side effects. Follow all directions for their use. ?This drug may make you feel generally unwell. This is not uncommon, as chemotherapy can affect healthy cells as well as cancer cells. Report any side effects. Continue your course of treatment even though you feel ill unless your doctor tells you to stop. ?Call your doctor or health care professional for advice if you get a fever, chills or sore throat, or other symptoms of a cold or flu. Do not treat yourself. This drug decreases your body's ability to fight infections. Try to avoid being around people who are sick. ?This medicine may increase your risk to bruise or bleed. Call your doctor or health care professional if you notice any unusual bleeding. ?Be careful brushing and flossing your teeth or using a toothpick because you may get an infection or bleed more easily. If you have any dental work done, tell your dentist you are receiving this medicine. ?Avoid taking products that contain aspirin, acetaminophen, ibuprofen, naproxen, or ketoprofen unless instructed by your  doctor. These medicines may hide a fever. ?Do not become pregnant while taking this medicine. Women should inform their doctor if they wish to become pregnant or think they might be pregnant. There is a potential for serious side effects to an unborn child. Talk to your health care professional or pharmacist for more information. Do not breast-feed an infant while taking this medicine. ?Men are advised not to father a child while receiving this medicine. ?This product  may contain alcohol. Ask your pharmacist or healthcare provider if this medicine contains alcohol. Be sure to tell all healthcare providers you are taking this medicine. Certain medicines, like metroni

## 2021-07-17 NOTE — Progress Notes (Signed)
Per C. Heilingoetter PA, ok to treat with creatnine of 1.59 today. ?

## 2021-07-17 NOTE — Telephone Encounter (Signed)
Left message with rescheduled upcoming appointment per patient's daughter's request. ?

## 2021-07-17 NOTE — Telephone Encounter (Signed)
Left message for Patient notifying him of prior authorization approval for Lidocaine-Prilocaine 2.5% Cream (EMLA). Medication is approved through 10/15/2021. ?

## 2021-07-18 ENCOUNTER — Ambulatory Visit: Payer: Medicare Other | Admitting: Physician Assistant

## 2021-07-18 ENCOUNTER — Telehealth: Payer: Self-pay

## 2021-07-18 ENCOUNTER — Other Ambulatory Visit: Payer: Medicare Other

## 2021-07-18 ENCOUNTER — Ambulatory Visit: Payer: Medicare Other

## 2021-07-18 NOTE — Telephone Encounter (Signed)
Mr Dauber states he his doing fine.  He was at his church doing a book study. ?He is eating, drinking, and urinating well. ?

## 2021-07-18 NOTE — Telephone Encounter (Signed)
-----   Message from Rolene Course, RN sent at 07/17/2021 12:54 PM EDT ----- ?Regarding: Feng 1st Tx F/U call - Taxol ?Feng 1st Tx F/U call - Taxol.  Patient tolerated well. ? ?

## 2021-07-22 ENCOUNTER — Encounter: Payer: Self-pay | Admitting: Hematology

## 2021-07-22 DIAGNOSIS — Z95828 Presence of other vascular implants and grafts: Secondary | ICD-10-CM | POA: Insufficient documentation

## 2021-07-23 ENCOUNTER — Inpatient Hospital Stay: Payer: Medicare Other

## 2021-07-23 ENCOUNTER — Other Ambulatory Visit: Payer: Self-pay

## 2021-07-23 VITALS — BP 135/70 | HR 83 | Temp 98.4°F | Resp 16 | Wt 175.2 lb

## 2021-07-23 DIAGNOSIS — E0590 Thyrotoxicosis, unspecified without thyrotoxic crisis or storm: Secondary | ICD-10-CM | POA: Diagnosis not present

## 2021-07-23 DIAGNOSIS — C155 Malignant neoplasm of lower third of esophagus: Secondary | ICD-10-CM | POA: Diagnosis not present

## 2021-07-23 DIAGNOSIS — E876 Hypokalemia: Secondary | ICD-10-CM

## 2021-07-23 DIAGNOSIS — Z95828 Presence of other vascular implants and grafts: Secondary | ICD-10-CM

## 2021-07-23 DIAGNOSIS — C778 Secondary and unspecified malignant neoplasm of lymph nodes of multiple regions: Secondary | ICD-10-CM | POA: Diagnosis not present

## 2021-07-23 DIAGNOSIS — K7460 Unspecified cirrhosis of liver: Secondary | ICD-10-CM | POA: Diagnosis not present

## 2021-07-23 DIAGNOSIS — Z5111 Encounter for antineoplastic chemotherapy: Secondary | ICD-10-CM | POA: Diagnosis not present

## 2021-07-23 DIAGNOSIS — C787 Secondary malignant neoplasm of liver and intrahepatic bile duct: Secondary | ICD-10-CM | POA: Diagnosis not present

## 2021-07-23 LAB — CBC WITH DIFFERENTIAL/PLATELET
Abs Immature Granulocytes: 0.03 10*3/uL (ref 0.00–0.07)
Basophils Absolute: 0.1 10*3/uL (ref 0.0–0.1)
Basophils Relative: 1 %
Eosinophils Absolute: 0.1 10*3/uL (ref 0.0–0.5)
Eosinophils Relative: 3 %
HCT: 30.8 % — ABNORMAL LOW (ref 39.0–52.0)
Hemoglobin: 10.6 g/dL — ABNORMAL LOW (ref 13.0–17.0)
Immature Granulocytes: 1 %
Lymphocytes Relative: 18 %
Lymphs Abs: 0.7 10*3/uL (ref 0.7–4.0)
MCH: 35.2 pg — ABNORMAL HIGH (ref 26.0–34.0)
MCHC: 34.4 g/dL (ref 30.0–36.0)
MCV: 102.3 fL — ABNORMAL HIGH (ref 80.0–100.0)
Monocytes Absolute: 0.1 10*3/uL (ref 0.1–1.0)
Monocytes Relative: 3 %
Neutro Abs: 2.8 10*3/uL (ref 1.7–7.7)
Neutrophils Relative %: 74 %
Platelets: 149 10*3/uL — ABNORMAL LOW (ref 150–400)
RBC: 3.01 MIL/uL — ABNORMAL LOW (ref 4.22–5.81)
RDW: 18.5 % — ABNORMAL HIGH (ref 11.5–15.5)
WBC: 3.8 10*3/uL — ABNORMAL LOW (ref 4.0–10.5)
nRBC: 0 % (ref 0.0–0.2)

## 2021-07-23 LAB — IRON AND IRON BINDING CAPACITY (CC-WL,HP ONLY)
Iron: 65 ug/dL (ref 45–182)
Saturation Ratios: 18 % (ref 17.9–39.5)
TIBC: 372 ug/dL (ref 250–450)
UIBC: 307 ug/dL (ref 117–376)

## 2021-07-23 LAB — COMPREHENSIVE METABOLIC PANEL
ALT: 12 U/L (ref 0–44)
AST: 27 U/L (ref 15–41)
Albumin: 3.7 g/dL (ref 3.5–5.0)
Alkaline Phosphatase: 77 U/L (ref 38–126)
Anion gap: 7 (ref 5–15)
BUN: 26 mg/dL — ABNORMAL HIGH (ref 8–23)
CO2: 20 mmol/L — ABNORMAL LOW (ref 22–32)
Calcium: 8.6 mg/dL — ABNORMAL LOW (ref 8.9–10.3)
Chloride: 110 mmol/L (ref 98–111)
Creatinine, Ser: 1.24 mg/dL (ref 0.61–1.24)
GFR, Estimated: 58 mL/min — ABNORMAL LOW (ref >60)
Glucose, Bld: 132 mg/dL — ABNORMAL HIGH (ref 70–99)
Potassium: 4.2 mmol/L (ref 3.5–5.1)
Sodium: 137 mmol/L (ref 135–145)
Total Bilirubin: 0.6 mg/dL (ref 0.3–1.2)
Total Protein: 6.9 g/dL (ref 6.5–8.1)

## 2021-07-23 LAB — FERRITIN: Ferritin: 42 ng/mL (ref 24–336)

## 2021-07-23 MED ORDER — SODIUM CHLORIDE 0.9 % IV SOLN
80.0000 mg/m2 | Freq: Once | INTRAVENOUS | Status: AC
  Administered 2021-07-23: 162 mg via INTRAVENOUS
  Filled 2021-07-23: qty 27

## 2021-07-23 MED ORDER — SODIUM CHLORIDE 0.9% FLUSH
10.0000 mL | Freq: Once | INTRAVENOUS | Status: AC
  Administered 2021-07-23: 10 mL

## 2021-07-23 MED ORDER — SODIUM CHLORIDE 0.9% FLUSH
10.0000 mL | INTRAVENOUS | Status: DC | PRN
  Administered 2021-07-23: 10 mL

## 2021-07-23 MED ORDER — HEPARIN SOD (PORK) LOCK FLUSH 100 UNIT/ML IV SOLN
500.0000 [IU] | Freq: Once | INTRAVENOUS | Status: AC | PRN
  Administered 2021-07-23: 500 [IU]

## 2021-07-23 MED ORDER — FAMOTIDINE IN NACL 20-0.9 MG/50ML-% IV SOLN
20.0000 mg | Freq: Once | INTRAVENOUS | Status: AC
  Administered 2021-07-23: 20 mg via INTRAVENOUS
  Filled 2021-07-23: qty 50

## 2021-07-23 MED ORDER — SODIUM CHLORIDE 0.9 % IV SOLN: Freq: Once | INTRAVENOUS | Status: AC

## 2021-07-23 MED ORDER — DIPHENHYDRAMINE HCL 50 MG/ML IJ SOLN
25.0000 mg | Freq: Once | INTRAMUSCULAR | Status: AC
  Administered 2021-07-23: 25 mg via INTRAVENOUS
  Filled 2021-07-23: qty 1

## 2021-07-23 MED ORDER — SODIUM CHLORIDE 0.9 % IV SOLN
10.0000 mg | Freq: Once | INTRAVENOUS | Status: AC
  Administered 2021-07-23: 10 mg via INTRAVENOUS
  Filled 2021-07-23: qty 10

## 2021-07-23 NOTE — Patient Instructions (Signed)
[facility]   Discharge Instructions: Thank you for choosing [facility] to provide your oncology and hematology care.   If you have a lab appointment with the Cancer Center, please go directly to the Cancer Center and check in at the registration area.   Wear comfortable clothing and clothing appropriate for easy access to any Portacath or PICC line.   We strive to give you quality time with your provider. You may need to reschedule your appointment if you arrive late (15 or more minutes).  Arriving late affects you and other patients whose appointments are after yours.  Also, if you miss three or more appointments without notifying the office, you may be dismissed from the clinic at the provider's discretion.      For prescription refill requests, have your pharmacy contact our office and allow 72 hours for refills to be completed.    Today you received the following chemotherapy and/or immunotherapy agents: paclitaxel.      To help prevent nausea and vomiting after your treatment, we encourage you to take your nausea medication as directed.  BELOW ARE SYMPTOMS THAT SHOULD BE REPORTED IMMEDIATELY: *FEVER GREATER THAN 100.4 F (38 C) OR HIGHER *CHILLS OR SWEATING *NAUSEA AND VOMITING THAT IS NOT CONTROLLED WITH YOUR NAUSEA MEDICATION *UNUSUAL SHORTNESS OF BREATH *UNUSUAL BRUISING OR BLEEDING *URINARY PROBLEMS (pain or burning when urinating, or frequent urination) *BOWEL PROBLEMS (unusual diarrhea, constipation, pain near the anus) TENDERNESS IN MOUTH AND THROAT WITH OR WITHOUT PRESENCE OF ULCERS (sore throat, sores in mouth, or a toothache) UNUSUAL RASH, SWELLING OR PAIN  UNUSUAL VAGINAL DISCHARGE OR ITCHING   Items with * indicate a potential emergency and should be followed up as soon as possible or go to the Emergency Department if any problems should occur.  Please show the CHEMOTHERAPY ALERT CARD or IMMUNOTHERAPY ALERT CARD at check-in  to the Emergency Department and triage nurse.  Should you have questions after your visit or need to cancel or reschedule your appointment, please contact [facility]  Dept: [phone]  and follow the prompts.  Office hours are 8:00 a.m. to 4:30 p.m. Monday - Friday. Please note that voicemails left after 4:00 p.m. may not be returned until the following business day.  We are closed weekends and major holidays. You have access to a nurse at all times for urgent questions. Please call the main number to the clinic Dept: [phone] and follow the prompts.   For any non-urgent questions, you may also contact your provider using MyChart. We now offer e-Visits for anyone 18 and older to request care online for non-urgent symptoms. For details visit [email or web address].   Also download the MyChart app! Go to the app store, search "MyChart", open the app, select [facility], and log in with your MyChart username and password.  Due to Covid, a mask is required upon entering the hospital/clinic. If you do not have a mask, one will be given to you upon arrival. For doctor visits, patients may have 1 support person aged 18 or older with them. For treatment visits, patients cannot have anyone with them due to current Covid guidelines and our immunocompromised population.   

## 2021-07-25 ENCOUNTER — Ambulatory Visit: Payer: Medicare Other

## 2021-07-25 ENCOUNTER — Other Ambulatory Visit: Payer: Medicare Other

## 2021-07-25 DIAGNOSIS — Z20822 Contact with and (suspected) exposure to covid-19: Secondary | ICD-10-CM | POA: Diagnosis not present

## 2021-07-25 NOTE — Patient Outreach (Signed)
Received a insurance referral notification for Mr.Oatis. ?I have assigned Valente David, RN to call for follow up and determine if there are any Case Management needs.  ?  ?[place] Care, CBCS, CMAA ?[facility] Management Assistant ?[facility] ?[phone]    ?

## 2021-07-26 DIAGNOSIS — Z20822 Contact with and (suspected) exposure to covid-19: Secondary | ICD-10-CM | POA: Diagnosis not present

## 2021-07-29 ENCOUNTER — Other Ambulatory Visit: Payer: Self-pay | Admitting: *Deleted

## 2021-07-29 NOTE — Patient Outreach (Signed)
[facility]) Care Management ? ?07/29/2021 ? ?Randy Wood ?30-Nov-1940 ?[id number] ? ? ? ?Referral Date: 4/28 ?Referral Source: Insurance ?Referral Reason: End-Stage Disease, Chronic care management ?Insurance: Traditional Medicare ? ? ?Outreach attempt #1, successful.  Identity verified.  This care manager introduced self and stated purpose of call.  [facility] care management services explained.  State he does not need additional assistance at this time.  Benefits of [facility] explained, he again declines stating he is active with the cancer clinic, palliative care, and has great family/church family support.  Agrees to receive [facility] information packet in the mail and will notify this care manager if needs change.  ? ? ?Plan: ?RN CM will send successful outreach letter and close case at this time as member declines participation. ? ?Randy David, RN, MSN, CCM ?[facility] Care Management  ?Community Care Manager ?[phone] ? ? ?

## 2021-07-30 MED FILL — Dexamethasone Sodium Phosphate Inj 100 MG/10ML: INTRAMUSCULAR | Qty: 1 | Status: AC

## 2021-07-31 ENCOUNTER — Inpatient Hospital Stay: Payer: Medicare Other | Attending: Nurse Practitioner

## 2021-07-31 ENCOUNTER — Inpatient Hospital Stay: Payer: Medicare Other

## 2021-07-31 ENCOUNTER — Other Ambulatory Visit: Payer: Self-pay

## 2021-07-31 ENCOUNTER — Inpatient Hospital Stay (HOSPITAL_BASED_OUTPATIENT_CLINIC_OR_DEPARTMENT_OTHER): Payer: Medicare Other | Admitting: Hematology

## 2021-07-31 VITALS — BP 117/65 | HR 80 | Temp 98.2°F | Resp 18 | Ht 71.0 in | Wt 173.4 lb

## 2021-07-31 DIAGNOSIS — Z79899 Other long term (current) drug therapy: Secondary | ICD-10-CM | POA: Insufficient documentation

## 2021-07-31 DIAGNOSIS — C7972 Secondary malignant neoplasm of left adrenal gland: Secondary | ICD-10-CM | POA: Diagnosis not present

## 2021-07-31 DIAGNOSIS — C155 Malignant neoplasm of lower third of esophagus: Secondary | ICD-10-CM

## 2021-07-31 DIAGNOSIS — E876 Hypokalemia: Secondary | ICD-10-CM

## 2021-07-31 DIAGNOSIS — Z95828 Presence of other vascular implants and grafts: Secondary | ICD-10-CM | POA: Diagnosis not present

## 2021-07-31 DIAGNOSIS — C787 Secondary malignant neoplasm of liver and intrahepatic bile duct: Secondary | ICD-10-CM | POA: Insufficient documentation

## 2021-07-31 DIAGNOSIS — Z5111 Encounter for antineoplastic chemotherapy: Secondary | ICD-10-CM | POA: Insufficient documentation

## 2021-07-31 LAB — CBC WITH DIFFERENTIAL/PLATELET
Abs Immature Granulocytes: 0.07 10*3/uL (ref 0.00–0.07)
Basophils Absolute: 0 10*3/uL (ref 0.0–0.1)
Basophils Relative: 1 %
Eosinophils Absolute: 0 10*3/uL (ref 0.0–0.5)
Eosinophils Relative: 2 %
HCT: 30.1 % — ABNORMAL LOW (ref 39.0–52.0)
Hemoglobin: 10.5 g/dL — ABNORMAL LOW (ref 13.0–17.0)
Immature Granulocytes: 4 %
Lymphocytes Relative: 42 %
Lymphs Abs: 0.7 10*3/uL (ref 0.7–4.0)
MCH: 35.5 pg — ABNORMAL HIGH (ref 26.0–34.0)
MCHC: 34.9 g/dL (ref 30.0–36.0)
MCV: 101.7 fL — ABNORMAL HIGH (ref 80.0–100.0)
Monocytes Absolute: 0.3 10*3/uL (ref 0.1–1.0)
Monocytes Relative: 16 %
Neutro Abs: 0.6 10*3/uL — ABNORMAL LOW (ref 1.7–7.7)
Neutrophils Relative %: 35 %
Platelets: 274 10*3/uL (ref 150–400)
RBC: 2.96 MIL/uL — ABNORMAL LOW (ref 4.22–5.81)
RDW: 17.5 % — ABNORMAL HIGH (ref 11.5–15.5)
WBC: 1.7 10*3/uL — ABNORMAL LOW (ref 4.0–10.5)
nRBC: 0 % (ref 0.0–0.2)

## 2021-07-31 LAB — COMPREHENSIVE METABOLIC PANEL
ALT: 11 U/L (ref 0–44)
AST: 20 U/L (ref 15–41)
Albumin: 3.8 g/dL (ref 3.5–5.0)
Alkaline Phosphatase: 65 U/L (ref 38–126)
Anion gap: 7 (ref 5–15)
BUN: 18 mg/dL (ref 8–23)
CO2: 21 mmol/L — ABNORMAL LOW (ref 22–32)
Calcium: 8.7 mg/dL — ABNORMAL LOW (ref 8.9–10.3)
Chloride: 105 mmol/L (ref 98–111)
Creatinine, Ser: 1.13 mg/dL (ref 0.61–1.24)
GFR, Estimated: >60 mL/min (ref >60)
Glucose, Bld: 138 mg/dL — ABNORMAL HIGH (ref 70–99)
Potassium: 4.1 mmol/L (ref 3.5–5.1)
Sodium: 133 mmol/L — ABNORMAL LOW (ref 135–145)
Total Bilirubin: 0.5 mg/dL (ref 0.3–1.2)
Total Protein: 7 g/dL (ref 6.5–8.1)

## 2021-07-31 LAB — IRON AND IRON BINDING CAPACITY (CC-WL,HP ONLY)
Iron: 42 ug/dL — ABNORMAL LOW (ref 45–182)
Saturation Ratios: 11 % — ABNORMAL LOW (ref 17.9–39.5)
TIBC: 371 ug/dL (ref 250–450)
UIBC: 329 ug/dL (ref 117–376)

## 2021-07-31 LAB — FERRITIN: Ferritin: 65 ng/mL (ref 24–336)

## 2021-07-31 MED ORDER — SODIUM CHLORIDE 0.9% FLUSH
10.0000 mL | Freq: Once | INTRAVENOUS | Status: AC
  Administered 2021-07-31: 10 mL

## 2021-07-31 MED ORDER — HEPARIN SOD (PORK) LOCK FLUSH 100 UNIT/ML IV SOLN
500.0000 [IU] | Freq: Once | INTRAVENOUS | Status: AC
  Administered 2021-07-31: 500 [IU]

## 2021-07-31 NOTE — Progress Notes (Signed)
?[facility]   ?[phone] [phone]   ?Clinic Follow up Note  ? ?Patient Care Team: ?[name], [name], MD as PCP - General (Family Medicine) ?[name], MD as Consulting Physician (Gastroenterology) ?[name], MD as Consulting Physician (Hematology) ?[name], NP as Nurse Practitioner (Nurse Practitioner) ?[name], RN as Equities trader ?Moya, Carlisle Beers, MD (Ophthalmology) ? ?Date of Service:  07/31/2021 ? ?CHIEF COMPLAINT: f/u of esophageal cancer ? ?CURRENT THERAPY:  ?Taxol and ramucirumab, days 1, 8, 15 q21d, starting 07/17/21 ? -ramucirumab held for cycle 1 D1 and D8 ? ?ASSESSMENT & PLAN:  ?Randy Wood is a 81 y.o. male with  ? ?1. 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  ?-PET 12/03/20 showed: esophageal tumor with local perigastric infiltration; hepatic and nodal metastasis involving the chest and abdomen extending to lower retroperitoneum; burst fracture at L5 with increased metabolic activity ?-Molecular testing showed MMR normal, PD-L1 CPS 3% ?-He received palliative radiation to the primary tumor 9/15 - 12/27/20 ?-He began single agent Xeloda 1 week on/1 week off and nivolumab every 2 weeks on 01/07/21, tolerating well ?-his thyroid function has became abnormal on nivolumab and was referred to Dr. Kelton Pillar.  ?-restaging CT on 06/30/21 showed worsening disease within nodes in chest and abdomen, liver, adrenal gland, and retroperitoneal nodes.  ?-we switched to taxol on 07/17/21, port placed the day before. He experienced abdominal cramps and diarrhea for 4-5 days after day 8. He has recovered well.  ?-He was scheduled to add ramucirumab  today. However, his WBC is down to 1.7 today, and he did not tolerate week 2 treatment well, so we will hold treatment for a week and move to infusion every other week. ?  ?2. Hyperthyroidism  ?-secondary to nivolumab ?-initially noted on 05/07/21 labs-- T4 17.5 and TSH <0.08. He is asymptomatic. ?-he established care with Dr. Kelton Pillar on 06/11/21. Repeat labs on 07/15/21 showed TSH 70.63 and free T4 0.31. He was started on synthroid on 07/16/21. ?  ?3. Cirrhosis, abdominal pain ?-felt to be alcohol-related. He has abstained from alcohol since May 17, 2020 ?-S/p paracentesis 05/09/20 yielding 4.4 liters, path showed reactive mesothelial cells ?-Compensated, hepatic function panel 09/17/20 WNL ?-Continue spironolactone.  Hold Lasix ?-followed by GI ?  ?4. Social support ?-He is widowed and lives alone, his wife died from [place] in 05-18-15, she was treated here ?-2 daughters live out of town, [name] lives in [place] and is the closest, also his primary contact for healthcare ?-He has church community and some family friends who could help if needed ?-He has met outpatient palliative care ?-Patient is independent with ADLs but has a Secretary/administrator and yard service.  ?  ?5. Family history  ?-Patient's mother had ovarian cancer, father had prostate cancer, and a daughter has breast cancer at age 59 ?-His daughter [name] without cancer underwent genetic testing and is negative ?-He was referred to genetics and underwent testing 12/09/20, results were negative ?  ?6. Glaucoma ?-The patient reports that Flonase was discontinued by his glaucoma specialist Dr. Ander Slade at the [facility] eye clinic in [place].  This was due to it contributing to increase in intraocular pressure. ?-his intraocular pressure is being checked by Dr. Ander Slade, next on 09/08/21. ?  ?  ?  PLAN: ?-hold chemo today due to neutropenia and slow recovery from last week of chemo ?-We will change his Taxol and romosozumab to every 2 weeks ?-lab, flush, and taxol and ramucizumab in 1, 3,  and 5 weeks. ?-f/u in 3 and 5 weeks ? ? ?No problem-specific Assessment & Plan notes found for this encounter. ? ? ?SUMMARY OF ONCOLOGIC HISTORY: ?Oncology History Overview Note  ?Cancer Staging ?No matching staging information was found for the patient. ? ?  ?Primary adenocarcinoma of distal third of esophagus ([facility])  ?11/11/2020 Procedure  ? EGD by Dr. Watt Climes ?- Normal larynx. ?-? Malignant-appearing esophageal stenosis. Biopsied. ?- Pills were found in the esophagus. Removal was successful. ?- Rule out malignancy,? gastric tumor in the cardia difficult to say based on bleeding from ?passing the scope and unable to wash and suction for adequate visualization. ?- Normal ampulla, duodenal bulb, first portion of the duodenum, second portion of the ?duodenum, major papilla and area of the papilla. ?- The examination was otherwise normal. ?  ?11/11/2020 Initial Biopsy  ? FINAL MICROSCOPIC DIAGNOSIS:  ?A. ESOPHAGUS, DISTAL, BIOPSY:  ?-  Poorly differentiated adenocarcinoma  ?-  See comment  ?  ?11/11/2020 Initial Diagnosis  ? Primary adenocarcinoma of distal third of esophagus ([facility]) ?  ?11/11/2020 Cancer Staging  ? Staging form: Esophagus - Adenocarcinoma, AJCC 8th Edition ?- Clinical stage from 11/11/2020: Stage IVB (cTX, cN1, cM1) - Signed by [name], MD on 12/26/2020 ? ?  ?11/14/2020 Imaging  ? CT CAPIMPRESSION: ?1. Aggressive appearing new tumor of the gastroesophageal junction ?confluent with the irregular collection of right gastric lymph ?nodes, and with new associated metastatic retroperitoneal ?adenopathy. Small paraesophageal lymph nodes in the thorax are ?increased in size from prior exam and could also be involved, and ?there is a nonspecific 1.0 cm lesion inferiorly in the right hepatic ?lobe which could potentially be metastatic. ?2. New subacute burst (AO type A4) fracture of L5, with mild ?posterior bony retropulsion and with a dominant coronally oriented ?fracture plane, and about 50% loss of height. Correlate  with ?interval trauma. ?3. Slight thickening along the left paracolic gutter and trace ?pelvic ascites, although the ascites is markedly reduced compared to ?05/04/2020. ?4. Other imaging findings of potential clinical significance: Aortic ?Atherosclerosis (ICD10-I70.0). Coronary atherosclerosis. Old ?compression fracture at L2. ?  ?12/03/2020 PET scan  ? IMPRESSION: ?Tumor at the GE junction with local perigastric infiltration, ?hepatic and nodal metastases. ?  ?Nodal involvement both in the chest and abdomen extending to the ?lower retroperitoneum in the abdomen. ?  ?Burst fracture at L5 shows signs of increased metabolic activity, ?nonspecific in the setting of fracture, in light of other findings ?would correlate with any history of trauma, if no history of trauma ?consider MRI to exclude the possibility of pathologic fracture. ?  ?12/27/2020 Genetic Testing  ? Negative hereditary cancer genetic testing: no pathogenic variants detected in [facility] Multi-Cancer +RNA Panel.  The report date is December 27, 2020.  ? ?The Multi-Cancer + RNA Panel offered by [facility] includes sequencing and/or deletion/duplication analysis of the following 84 genes:  AIP*, ALK, APC*, ATM*, AXIN2*, BAP1*, BARD1*, BLM*, BMPR1A*, BRCA1*, BRCA2*, BRIP1*, CASR, CDC73*, CDH1*, CDK4, CDKN1B*, CDKN1C*, CDKN2A, CEBPA, CHEK2*, CTNNA1*, DICER1*, DIS3L2*, EGFR, EPCAM, FH*, FLCN*, GATA2*, GPC3, GREM1, HOXB13, HRAS, KIT, MAX*, MEN1*, MET, MITF, MLH1*, MSH2*, MSH3*, MSH6*, MUTYH*, NBN*, NF1*, NF2*, NTHL1*, PALB2*, PDGFRA, PHOX2B, PMS2*, POLD1*, POLE*, POT1*, PRKAR1A*, PTCH1*, PTEN*, RAD50*, RAD51C*, RAD51D*, RB1*, RECQL4, RET, RUNX1*, SDHA*, SDHAF2*, SDHB*, SDHC*, SDHD*, SMAD4*, SMARCA4*, SMARCB1*, SMARCE1*,  STK11*, SUFU*, TERC, TERT, TMEM127*, Tp53*, TSC1*, TSC2*, VHL*, WRN*, and WT1.  RNA analysis is performed for * genes. ?  ?01/07/2021 - 05/07/2021 Chemotherapy  ? Patient is on Treatment Plan : GASTROESOPHAGEAL Nivolumab q14d x 8 cycles / Nivolumab  q28d  ? ?  ?  ?03/26/2021 Imaging  ? CT CAP IMPRESSION: ?1. Evidence of treatment response. There is decreased size of the irregular infiltrative mass centered at the gastroesophageal junction as well

## 2021-08-01 ENCOUNTER — Ambulatory Visit: Payer: Medicare Other | Admitting: Hematology

## 2021-08-01 ENCOUNTER — Ambulatory Visit: Payer: Medicare Other

## 2021-08-01 ENCOUNTER — Other Ambulatory Visit: Payer: Medicare Other

## 2021-08-02 ENCOUNTER — Encounter: Payer: Self-pay | Admitting: Hematology

## 2021-08-05 DIAGNOSIS — Z20822 Contact with and (suspected) exposure to covid-19: Secondary | ICD-10-CM | POA: Diagnosis not present

## 2021-08-07 ENCOUNTER — Inpatient Hospital Stay: Payer: Medicare Other

## 2021-08-07 ENCOUNTER — Other Ambulatory Visit: Payer: Self-pay

## 2021-08-07 ENCOUNTER — Telehealth: Payer: Self-pay | Admitting: Hematology

## 2021-08-07 VITALS — BP 132/64 | HR 80 | Temp 98.1°F | Resp 18 | Wt 176.5 lb

## 2021-08-07 DIAGNOSIS — Z95828 Presence of other vascular implants and grafts: Secondary | ICD-10-CM

## 2021-08-07 DIAGNOSIS — E876 Hypokalemia: Secondary | ICD-10-CM

## 2021-08-07 DIAGNOSIS — C155 Malignant neoplasm of lower third of esophagus: Secondary | ICD-10-CM

## 2021-08-07 DIAGNOSIS — Z5111 Encounter for antineoplastic chemotherapy: Secondary | ICD-10-CM | POA: Diagnosis not present

## 2021-08-07 DIAGNOSIS — Z79899 Other long term (current) drug therapy: Secondary | ICD-10-CM | POA: Diagnosis not present

## 2021-08-07 DIAGNOSIS — C7972 Secondary malignant neoplasm of left adrenal gland: Secondary | ICD-10-CM | POA: Diagnosis not present

## 2021-08-07 DIAGNOSIS — C787 Secondary malignant neoplasm of liver and intrahepatic bile duct: Secondary | ICD-10-CM | POA: Diagnosis not present

## 2021-08-07 LAB — COMPREHENSIVE METABOLIC PANEL
ALT: 14 U/L (ref 0–44)
AST: 22 U/L (ref 15–41)
Albumin: 3.7 g/dL (ref 3.5–5.0)
Alkaline Phosphatase: 63 U/L (ref 38–126)
Anion gap: 6 (ref 5–15)
BUN: 17 mg/dL (ref 8–23)
CO2: 22 mmol/L (ref 22–32)
Calcium: 8.8 mg/dL — ABNORMAL LOW (ref 8.9–10.3)
Chloride: 108 mmol/L (ref 98–111)
Creatinine, Ser: 1.03 mg/dL (ref 0.61–1.24)
GFR, Estimated: >60 mL/min (ref >60)
Glucose, Bld: 144 mg/dL — ABNORMAL HIGH (ref 70–99)
Potassium: 4.2 mmol/L (ref 3.5–5.1)
Sodium: 136 mmol/L (ref 135–145)
Total Bilirubin: 0.4 mg/dL (ref 0.3–1.2)
Total Protein: 6.9 g/dL (ref 6.5–8.1)

## 2021-08-07 LAB — FERRITIN: Ferritin: 22 ng/mL — ABNORMAL LOW (ref 24–336)

## 2021-08-07 LAB — CBC WITH DIFFERENTIAL/PLATELET
Abs Immature Granulocytes: 0.11 10*3/uL — ABNORMAL HIGH (ref 0.00–0.07)
Basophils Absolute: 0.1 10*3/uL (ref 0.0–0.1)
Basophils Relative: 1 %
Eosinophils Absolute: 0 10*3/uL (ref 0.0–0.5)
Eosinophils Relative: 1 %
HCT: 31.6 % — ABNORMAL LOW (ref 39.0–52.0)
Hemoglobin: 10.7 g/dL — ABNORMAL LOW (ref 13.0–17.0)
Immature Granulocytes: 3 %
Lymphocytes Relative: 21 %
Lymphs Abs: 0.9 10*3/uL (ref 0.7–4.0)
MCH: 34.7 pg — ABNORMAL HIGH (ref 26.0–34.0)
MCHC: 33.9 g/dL (ref 30.0–36.0)
MCV: 102.6 fL — ABNORMAL HIGH (ref 80.0–100.0)
Monocytes Absolute: 0.6 10*3/uL (ref 0.1–1.0)
Monocytes Relative: 14 %
Neutro Abs: 2.4 10*3/uL (ref 1.7–7.7)
Neutrophils Relative %: 60 %
Platelets: 193 10*3/uL (ref 150–400)
RBC: 3.08 MIL/uL — ABNORMAL LOW (ref 4.22–5.81)
RDW: 17.4 % — ABNORMAL HIGH (ref 11.5–15.5)
WBC: 4 10*3/uL (ref 4.0–10.5)
nRBC: 0 % (ref 0.0–0.2)

## 2021-08-07 LAB — IRON AND IRON BINDING CAPACITY (CC-WL,HP ONLY)
Iron: 65 ug/dL (ref 45–182)
Saturation Ratios: 17 % — ABNORMAL LOW (ref 17.9–39.5)
TIBC: 381 ug/dL (ref 250–450)
UIBC: 316 ug/dL (ref 117–376)

## 2021-08-07 LAB — TOTAL PROTEIN, URINE DIPSTICK: Protein, ur: NEGATIVE mg/dL

## 2021-08-07 MED ORDER — HEPARIN SOD (PORK) LOCK FLUSH 100 UNIT/ML IV SOLN
500.0000 [IU] | Freq: Once | INTRAVENOUS | Status: AC | PRN
  Administered 2021-08-07: 500 [IU]

## 2021-08-07 MED ORDER — FAMOTIDINE IN NACL 20-0.9 MG/50ML-% IV SOLN
20.0000 mg | Freq: Once | INTRAVENOUS | Status: AC
  Administered 2021-08-07: 20 mg via INTRAVENOUS
  Filled 2021-08-07: qty 50

## 2021-08-07 MED ORDER — SODIUM CHLORIDE 0.9 % IV SOLN
10.0000 mg | Freq: Once | INTRAVENOUS | Status: AC
  Administered 2021-08-07: 10 mg via INTRAVENOUS
  Filled 2021-08-07: qty 10

## 2021-08-07 MED ORDER — SODIUM CHLORIDE 0.9 % IV SOLN: Freq: Once | INTRAVENOUS | Status: AC

## 2021-08-07 MED ORDER — SODIUM CHLORIDE 0.9 % IV SOLN
8.0000 mg/kg | Freq: Once | INTRAVENOUS | Status: AC
  Administered 2021-08-07: 600 mg via INTRAVENOUS
  Filled 2021-08-07: qty 50

## 2021-08-07 MED ORDER — SODIUM CHLORIDE 0.9% FLUSH
10.0000 mL | Freq: Once | INTRAVENOUS | Status: AC
  Administered 2021-08-07: 10 mL

## 2021-08-07 MED ORDER — SODIUM CHLORIDE 0.9% FLUSH
10.0000 mL | INTRAVENOUS | Status: DC | PRN
  Administered 2021-08-07: 10 mL

## 2021-08-07 MED ORDER — DIPHENHYDRAMINE HCL 50 MG/ML IJ SOLN
25.0000 mg | Freq: Once | INTRAMUSCULAR | Status: AC
  Administered 2021-08-07: 25 mg via INTRAVENOUS
  Filled 2021-08-07: qty 1

## 2021-08-07 MED ORDER — SODIUM CHLORIDE 0.9 % IV SOLN
80.0000 mg/m2 | Freq: Once | INTRAVENOUS | Status: AC
  Administered 2021-08-07: 162 mg via INTRAVENOUS
  Filled 2021-08-07: qty 27

## 2021-08-07 NOTE — Progress Notes (Signed)
Per Dr. Burr Medico, ok to proceed with treatment without urine results today ?

## 2021-08-07 NOTE — Telephone Encounter (Signed)
Scheduled follow-up appointments per 5/4 los. Patient is aware. 

## 2021-08-07 NOTE — Patient Instructions (Signed)
[facility]  Discharge Instructions: ?Thank you for choosing [facility] to provide your oncology and hematology care.  ? ?If you have a lab appointment with the [facility], please go directly to the [facility] and check in at the registration area. ?  ?Wear comfortable clothing and clothing appropriate for easy access to any Portacath or PICC line.  ? ?We strive to give you quality time with your provider. You may need to reschedule your appointment if you arrive late (15 or more minutes).  Arriving late affects you and other patients whose appointments are after yours.  Also, if you miss three or more appointments without notifying the office, you may be dismissed from the clinic at the provider?s discretion.    ?  ?For prescription refill requests, have your pharmacy contact our office and allow 72 hours for refills to be completed.   ? ?Today you received the following chemotherapy and/or immunotherapy agents: Cyramza, Taxol    ?  ?To help prevent nausea and vomiting after your treatment, we encourage you to take your nausea medication as directed. ? ?BELOW ARE SYMPTOMS THAT SHOULD BE REPORTED IMMEDIATELY: ?*FEVER GREATER THAN 100.4 F (38 ?C) OR HIGHER ?*CHILLS OR SWEATING ?*NAUSEA AND VOMITING THAT IS NOT CONTROLLED WITH YOUR NAUSEA MEDICATION ?*UNUSUAL SHORTNESS OF BREATH ?*UNUSUAL BRUISING OR BLEEDING ?*URINARY PROBLEMS (pain or burning when urinating, or frequent urination) ?*BOWEL PROBLEMS (unusual diarrhea, constipation, pain near the anus) ?TENDERNESS IN MOUTH AND THROAT WITH OR WITHOUT PRESENCE OF ULCERS (sore throat, sores in mouth, or a toothache) ?UNUSUAL RASH, SWELLING OR PAIN  ?UNUSUAL VAGINAL DISCHARGE OR ITCHING  ? ?Items with * indicate a potential emergency and should be followed up as soon as possible or go to the Emergency Department if any problems should occur. ? ?Please show the CHEMOTHERAPY ALERT CARD or IMMUNOTHERAPY ALERT CARD at  check-in to the Emergency Department and triage nurse. ? ?Should you have questions after your visit or need to cancel or reschedule your appointment, please contact [facility]  Dept: [phone]  and follow the prompts.  Office hours are 8:00 a.m. to 4:30 p.m. Monday - Friday. Please note that voicemails left after 4:00 p.m. may not be returned until the following business day.  We are closed weekends and major holidays. You have access to a nurse at all times for urgent questions. Please call the main number to the clinic Dept: [phone] and follow the prompts. ? ? ?For any non-urgent questions, you may also contact your provider using MyChart. We now offer e-Visits for anyone 82 and older to request care online for non-urgent symptoms. For details visit [email or web address]. ?  ?Also download the MyChart app! Go to the app store, search "MyChart", open the app, select [facility], and log in with your MyChart username and password. ? ?Due to Covid, a mask is required upon entering the hospital/clinic. If you do not have a mask, one will be given to you upon arrival. For doctor visits, patients may have 1 support person aged 82 or older with them. For treatment visits, patients cannot have anyone with them due to current Covid guidelines and our immunocompromised population.  ? ?

## 2021-08-14 ENCOUNTER — Ambulatory Visit: Payer: Medicare Other

## 2021-08-14 ENCOUNTER — Other Ambulatory Visit: Payer: Medicare Other

## 2021-08-14 ENCOUNTER — Ambulatory Visit: Payer: Medicare Other | Admitting: Hematology

## 2021-08-18 ENCOUNTER — Encounter (INDEPENDENT_AMBULATORY_CARE_PROVIDER_SITE_OTHER): Payer: Medicare Other | Admitting: Ophthalmology

## 2021-08-18 DIAGNOSIS — H43813 Vitreous degeneration, bilateral: Secondary | ICD-10-CM

## 2021-08-18 DIAGNOSIS — H353122 Nonexudative age-related macular degeneration, left eye, intermediate dry stage: Secondary | ICD-10-CM | POA: Diagnosis not present

## 2021-08-18 DIAGNOSIS — H35033 Hypertensive retinopathy, bilateral: Secondary | ICD-10-CM

## 2021-08-18 DIAGNOSIS — I10 Essential (primary) hypertension: Secondary | ICD-10-CM

## 2021-08-18 DIAGNOSIS — H353211 Exudative age-related macular degeneration, right eye, with active choroidal neovascularization: Secondary | ICD-10-CM | POA: Diagnosis not present

## 2021-08-20 MED FILL — Dexamethasone Sodium Phosphate Inj 100 MG/10ML: INTRAMUSCULAR | Qty: 1 | Status: AC

## 2021-08-21 ENCOUNTER — Inpatient Hospital Stay (HOSPITAL_BASED_OUTPATIENT_CLINIC_OR_DEPARTMENT_OTHER): Payer: Medicare Other | Admitting: Adult Health

## 2021-08-21 ENCOUNTER — Other Ambulatory Visit: Payer: Self-pay

## 2021-08-21 ENCOUNTER — Inpatient Hospital Stay: Payer: Medicare Other

## 2021-08-21 ENCOUNTER — Other Ambulatory Visit: Payer: Medicare Other

## 2021-08-21 ENCOUNTER — Ambulatory Visit: Payer: Medicare Other

## 2021-08-21 ENCOUNTER — Encounter: Payer: Self-pay | Admitting: Adult Health

## 2021-08-21 VITALS — BP 131/67 | HR 78 | Temp 97.4°F | Resp 18 | Ht 71.0 in | Wt 181.2 lb

## 2021-08-21 DIAGNOSIS — C155 Malignant neoplasm of lower third of esophagus: Secondary | ICD-10-CM

## 2021-08-21 DIAGNOSIS — E876 Hypokalemia: Secondary | ICD-10-CM

## 2021-08-21 DIAGNOSIS — C7972 Secondary malignant neoplasm of left adrenal gland: Secondary | ICD-10-CM | POA: Diagnosis not present

## 2021-08-21 DIAGNOSIS — Z95828 Presence of other vascular implants and grafts: Secondary | ICD-10-CM

## 2021-08-21 DIAGNOSIS — Z5111 Encounter for antineoplastic chemotherapy: Secondary | ICD-10-CM | POA: Diagnosis not present

## 2021-08-21 DIAGNOSIS — Z79899 Other long term (current) drug therapy: Secondary | ICD-10-CM | POA: Diagnosis not present

## 2021-08-21 DIAGNOSIS — C787 Secondary malignant neoplasm of liver and intrahepatic bile duct: Secondary | ICD-10-CM | POA: Diagnosis not present

## 2021-08-21 LAB — IRON AND IRON BINDING CAPACITY (CC-WL,HP ONLY)
Iron: 59 ug/dL (ref 45–182)
Saturation Ratios: 15 % — ABNORMAL LOW (ref 17.9–39.5)
TIBC: 399 ug/dL (ref 250–450)
UIBC: 340 ug/dL (ref 117–376)

## 2021-08-21 LAB — COMPREHENSIVE METABOLIC PANEL
ALT: 17 U/L (ref 0–44)
AST: 27 U/L (ref 15–41)
Albumin: 3.7 g/dL (ref 3.5–5.0)
Alkaline Phosphatase: 72 U/L (ref 38–126)
Anion gap: 7 (ref 5–15)
BUN: 21 mg/dL (ref 8–23)
CO2: 20 mmol/L — ABNORMAL LOW (ref 22–32)
Calcium: 8.5 mg/dL — ABNORMAL LOW (ref 8.9–10.3)
Chloride: 113 mmol/L — ABNORMAL HIGH (ref 98–111)
Creatinine, Ser: 1.01 mg/dL (ref 0.61–1.24)
GFR, Estimated: >60 mL/min (ref >60)
Glucose, Bld: 115 mg/dL — ABNORMAL HIGH (ref 70–99)
Potassium: 3.9 mmol/L (ref 3.5–5.1)
Sodium: 140 mmol/L (ref 135–145)
Total Bilirubin: 0.6 mg/dL (ref 0.3–1.2)
Total Protein: 6.6 g/dL (ref 6.5–8.1)

## 2021-08-21 LAB — CBC WITH DIFFERENTIAL/PLATELET
Abs Immature Granulocytes: 0.01 10*3/uL (ref 0.00–0.07)
Basophils Absolute: 0.1 10*3/uL (ref 0.0–0.1)
Basophils Relative: 2 %
Eosinophils Absolute: 0.2 10*3/uL (ref 0.0–0.5)
Eosinophils Relative: 5 %
HCT: 30.6 % — ABNORMAL LOW (ref 39.0–52.0)
Hemoglobin: 10.5 g/dL — ABNORMAL LOW (ref 13.0–17.0)
Immature Granulocytes: 0 %
Lymphocytes Relative: 27 %
Lymphs Abs: 0.8 10*3/uL (ref 0.7–4.0)
MCH: 34.5 pg — ABNORMAL HIGH (ref 26.0–34.0)
MCHC: 34.3 g/dL (ref 30.0–36.0)
MCV: 100.7 fL — ABNORMAL HIGH (ref 80.0–100.0)
Monocytes Absolute: 0.4 10*3/uL (ref 0.1–1.0)
Monocytes Relative: 14 %
Neutro Abs: 1.5 10*3/uL — ABNORMAL LOW (ref 1.7–7.7)
Neutrophils Relative %: 52 %
Platelets: 148 10*3/uL — ABNORMAL LOW (ref 150–400)
RBC: 3.04 MIL/uL — ABNORMAL LOW (ref 4.22–5.81)
RDW: 16.4 % — ABNORMAL HIGH (ref 11.5–15.5)
WBC: 2.9 10*3/uL — ABNORMAL LOW (ref 4.0–10.5)
nRBC: 0 % (ref 0.0–0.2)

## 2021-08-21 LAB — FERRITIN: Ferritin: 22 ng/mL — ABNORMAL LOW (ref 24–336)

## 2021-08-21 MED ORDER — SODIUM CHLORIDE 0.9 % IV SOLN: Freq: Once | INTRAVENOUS | Status: AC

## 2021-08-21 MED ORDER — SODIUM CHLORIDE 0.9 % IV SOLN
8.0000 mg/kg | Freq: Once | INTRAVENOUS | Status: AC
  Administered 2021-08-21: 600 mg via INTRAVENOUS
  Filled 2021-08-21: qty 50

## 2021-08-21 MED ORDER — SODIUM CHLORIDE 0.9% FLUSH
10.0000 mL | Freq: Once | INTRAVENOUS | Status: AC
  Administered 2021-08-21: 10 mL

## 2021-08-21 MED ORDER — SODIUM CHLORIDE 0.9 % IV SOLN
10.0000 mg | Freq: Once | INTRAVENOUS | Status: AC
  Administered 2021-08-21: 10 mg via INTRAVENOUS
  Filled 2021-08-21: qty 10

## 2021-08-21 MED ORDER — FAMOTIDINE IN NACL 20-0.9 MG/50ML-% IV SOLN
20.0000 mg | Freq: Once | INTRAVENOUS | Status: AC
  Administered 2021-08-21: 20 mg via INTRAVENOUS
  Filled 2021-08-21: qty 50

## 2021-08-21 MED ORDER — HEPARIN SOD (PORK) LOCK FLUSH 100 UNIT/ML IV SOLN
500.0000 [IU] | Freq: Once | INTRAVENOUS | Status: AC | PRN
  Administered 2021-08-21: 500 [IU]

## 2021-08-21 MED ORDER — DIPHENHYDRAMINE HCL 50 MG/ML IJ SOLN
25.0000 mg | Freq: Once | INTRAMUSCULAR | Status: AC
  Administered 2021-08-21: 25 mg via INTRAVENOUS
  Filled 2021-08-21: qty 1

## 2021-08-21 MED ORDER — SODIUM CHLORIDE 0.9% FLUSH
10.0000 mL | INTRAVENOUS | Status: DC | PRN
  Administered 2021-08-21: 10 mL

## 2021-08-21 MED ORDER — SODIUM CHLORIDE 0.9 % IV SOLN
80.0000 mg/m2 | Freq: Once | INTRAVENOUS | Status: AC
  Administered 2021-08-21: 162 mg via INTRAVENOUS
  Filled 2021-08-21: qty 27

## 2021-08-21 MED ORDER — SODIUM CHLORIDE 0.9 % IV SOLN: INTRAVENOUS | Status: DC

## 2021-08-21 NOTE — Progress Notes (Signed)
**Note Randy-Identified via Obfuscation** [name] Follow up:    Randy Wood, Randy Reveal, MD [place] [address]   DIAGNOSIS:  Cancer Staging  Primary adenocarcinoma of distal third of esophagus [facility]) Staging form: Esophagus - Adenocarcinoma, AJCC 8th Edition - Clinical stage from 11/11/2020: Stage IVB (cTX, cN1, cM1) - Signed by Randy Merle, MD on 12/26/2020   SUMMARY OF ONCOLOGIC HISTORY: Oncology History Overview Note  Cancer Staging No matching staging information was found for the patient.    Primary adenocarcinoma of distal third of esophagus (Randy Wood)  11/11/2020 Procedure   EGD by Dr. Watt Wood - Normal larynx. -? Malignant-appearing esophageal stenosis. Biopsied. - Pills were found in the esophagus. Removal was successful. - Rule out malignancy,? gastric tumor in the cardia difficult to say based on bleeding from passing the scope and unable to wash and suction for adequate visualization. - Normal ampulla, duodenal bulb, first portion of the duodenum, second portion of the duodenum, major papilla and area of the papilla. - The examination was otherwise normal.   11/11/2020 Initial Biopsy   FINAL MICROSCOPIC DIAGNOSIS:  A. ESOPHAGUS, DISTAL, BIOPSY:  -  Poorly differentiated adenocarcinoma  -  See comment    11/11/2020 Initial Diagnosis   Primary adenocarcinoma of distal third of esophagus (Randy Wood)   11/11/2020 Cancer Staging   Staging form: Esophagus - Adenocarcinoma, AJCC 8th Edition - Clinical stage from 11/11/2020: Stage IVB (cTX, cN1, cM1) - Signed by Randy Merle, MD on 12/26/2020    11/14/2020 Imaging   CT CAPIMPRESSION: 1. Aggressive appearing new tumor of the gastroesophageal junction confluent with the irregular collection of right gastric lymph nodes, and with new associated metastatic retroperitoneal adenopathy. Small paraesophageal lymph nodes in the thorax are increased in size from prior exam and could also be involved, and there is a nonspecific 1.0 cm lesion  inferiorly in the right hepatic lobe which could potentially be metastatic. 2. New subacute burst (AO type A4) fracture of L5, with mild posterior bony retropulsion and with a dominant coronally oriented fracture plane, and about 50% loss of height. Correlate with interval trauma. 3. Slight thickening along the left paracolic gutter and trace pelvic ascites, although the ascites is markedly reduced compared to 05/04/2020. 4. Other imaging findings of potential clinical significance: Aortic Atherosclerosis (ICD10-I70.0). Coronary atherosclerosis. Old compression fracture at L2.   12/03/2020 PET scan   IMPRESSION: Tumor at the GE junction with local perigastric infiltration, hepatic and nodal metastases.   Nodal involvement both in the chest and abdomen extending to the lower retroperitoneum in the abdomen.   Burst fracture at L5 shows signs of increased metabolic activity, nonspecific in the setting of fracture, in light of other findings would correlate with any history of trauma, if no history of trauma consider MRI to exclude the possibility of pathologic fracture.   12/27/2020 Genetic Testing   Negative hereditary cancer genetic testing: no pathogenic variants detected in [facility] Multi-Cancer +RNA Panel.  The report date is December 27, 2020.   The Multi-Cancer + RNA Panel offered by [facility] includes sequencing and/or deletion/duplication analysis of the following 84 genes:  AIP*, ALK, APC*, ATM*, AXIN2*, BAP1*, BARD1*, BLM*, BMPR1A*, BRCA1*, BRCA2*, BRIP1*, CASR, CDC73*, CDH1*, CDK4, CDKN1B*, CDKN1C*, CDKN2A, CEBPA, CHEK2*, CTNNA1*, DICER1*, DIS3L2*, EGFR, EPCAM, FH*, FLCN*, GATA2*, GPC3, GREM1, HOXB13, HRAS, KIT, MAX*, MEN1*, MET, MITF, MLH1*, MSH2*, MSH3*, MSH6*, MUTYH*, NBN*, NF1*, NF2*, NTHL1*, PALB2*, PDGFRA, PHOX2B, PMS2*, POLD1*, POLE*, POT1*, PRKAR1A*, PTCH1*, PTEN*, RAD50*, RAD51C*, RAD51D*, RB1*, RECQL4, RET, RUNX1*, SDHA*, SDHAF2*, SDHB*, SDHC*,  SDHD*, SMAD4*, SMARCA4*,  SMARCB1*, SMARCE1*, STK11*, SUFU*, TERC, TERT, TMEM127*, Tp53*, TSC1*, TSC2*, VHL*, WRN*, and WT1.  RNA analysis is performed for * genes.   01/07/2021 - 05/07/2021 Chemotherapy   Patient is on Treatment Plan : GASTROESOPHAGEAL Nivolumab q14d x 8 cycles / Nivolumab q28d      03/26/2021 Imaging   CT CAP IMPRESSION: 1. Evidence of treatment response. There is decreased size of the irregular infiltrative mass centered at the gastroesophageal junction as well as decreased size of numerous metastatic retroperitoneal and upper abdominal lymph nodes. 2. 2.1 cm hepatic metastasis at the inferior right hepatic lobe segment 6 is stable to slightly increased in size since previous PET-CT. No new hepatic mass visualized. 3. A few new adjacent small pulmonary nodules in the posterior left lower lobe measuring up to 4 mm in size. Continued follow-up recommended. 4. Small right pleural effusion which is new since previous study. Small pericardial effusion increased since previous study. 5. Other ancillary findings as described.     06/30/2021 Imaging   EXAM: CT CHEST, ABDOMEN, AND PELVIS WITH CONTRAST  IMPRESSION: 1. Worsening nodal disease in the chest and abdomen more so within the abdomen as described. 2. Increasing size of RIGHT pleural fluid and pericardial effusion. Now moderate. 3. Increasing size of RIGHT hepatic lesion. 4. New LEFT adrenal metastasis. 5. Increasing size of retroperitoneal adenopathy. 6. Obstruction of the RIGHT ureter may be related to a metastatic lesion to the RIGHT ureter, blood clot or primary urothelial lesion and is associated with mild hydronephrosis, moderate ureteral dilation and potential filling defect also in lower pole calyces of the RIGHT kidney. 7. Trace perihepatic ascites. 8. Aortic atherosclerosis.   07/17/2021 -  Chemotherapy   Patient is on Treatment Plan : GASTROESOPHAGEAL Ramucirumab D1, 15  / PACLitaxel D1,8,15 q28d        CURRENT THERAPY:  Taxol/Cyramza  INTERVAL HISTORY: Randy Wood 81 y.o. male returns for follow-up prior to receiving Taxol and Cyramza.  He is feeling well today.  He has no significant issues.  He is eating and drinking well.  He denies any new concerns.  He has stable baseline peripheral neuropathy in the tips of the pads of his fingers.  This has not worsened since starting the Taxol.  He does struggle with his water intake and has an occasional leg cramp in the middle of the night that he experiences.   Patient Active Problem List   Diagnosis Date Noted   Port-A-Cath in place 07/22/2021   Constipation 07/08/2021   Dysphagia 07/08/2021   Personal history of colonic polyps 07/08/2021   Sinus disease 02/12/2021   Exudative age-related macular degeneration, right eye, with active choroidal neovascularization ([place]) 02/05/2021   Posterior capsular opacification of left eye, obscuring vision 02/05/2021   Primary open angle glaucoma (POAG) of left eye, severe stage 02/05/2021   Pseudophakia of both eyes 02/05/2021   Hypokalemia 01/06/2021   Genetic testing 12/30/2020   Family history of breast cancer 12/09/2020   Family history of ovarian cancer 12/09/2020   Family history of prostate cancer 12/09/2020   Primary adenocarcinoma of distal third of esophagus ([place]) 11/25/2020   Infection due to ESBL-producing Escherichia coli 05/08/2020   Cirrhosis ([place]) 05/08/2020   SBP (spontaneous bacterial peritonitis) ([place])    Anasarca 05/04/2020   Liver failure ([place]) 05/04/2020   Acute renal failure (ARF) ([place]) 05/04/2020   Dyspnea on exertion 05/04/2020   Macrocytic anemia 05/04/2020   Transaminitis 05/04/2020   Hyperbilirubinemia 05/04/2020   Acne  rosacea 03/20/2020   Adenomatous polyp of colon 03/20/2020   Body mass index (BMI) 31.0-31.9, adult 03/20/2020   Degenerative joint disease of ankle 03/20/2020   Erectile dysfunction 03/20/2020   Esophageal stricture 03/20/2020   Essential hypertension 03/20/2020    Essential tremor 03/20/2020   Gait abnormality 03/20/2020   Gastro-esophageal reflux disease without esophagitis 03/20/2020   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   Hypercholesterolemia 03/20/2020   Irritable bowel syndrome 03/20/2020   Male hypogonadism 03/20/2020   Osteoarthritis of knee 03/20/2020   Perennial allergic rhinitis 03/20/2020   Vasomotor rhinitis 03/20/2020    is allergic to lisinopril.  MEDICAL HISTORY: Past Medical History:  Diagnosis Date   AKI (acute kidney injury) ([place])    Alcohol abuse    Arthritis    Cataract 07/04/2004   Diarrhea    Edema leg    Esophageal cancer ([place])    Family history of breast cancer 12/09/2020   Family history of ovarian cancer 12/09/2020   Family history of prostate cancer 12/09/2020   GERD (gastroesophageal reflux disease)    Glaucoma unknown   Hyperbilirubinemia    Hyperlipemia    Hypertension    Hypokalemia    Liver failure ([facility])    Macular degeneration, wet ([facility])    Microcytic anemia    Osteoarthritis    SOB (shortness of breath)    Weight gain     SURGICAL HISTORY: Past Surgical History:  Procedure Laterality Date   ANKLE SURGERY     BIOPSY  11/11/2020   Procedure: BIOPSY;  Surgeon: [name], MD;  Location: [facility];  Service: Gastroenterology;;   CATARACT EXTRACTION, BILATERAL     ESOPHAGOGASTRODUODENOSCOPY (EGD) WITH PROPOFOL N/A 11/11/2020   Procedure: ESOPHAGOGASTRODUODENOSCOPY (EGD) WITH PROPOFOL;  Surgeon: [name], MD;  Location: [facility];  Service: Gastroenterology;  Laterality: N/A;   EYE SURGERY  July 04, 2004   FOREIGN BODY REMOVAL  11/11/2020   Procedure: FOREIGN BODY REMOVAL;  Surgeon: [name], MD;  Location: [facility];  Service: Gastroenterology;;   IR IMAGING GUIDED PORT INSERTION  07/16/2021   IR PARACENTESIS  05/09/2020   TONSILLECTOMY      SOCIAL HISTORY: Social History   Socioeconomic History   Marital status: Widowed    Spouse name: Not on file   Number of children: 2   Years of  education: Not on file   Highest education level: Bachelor's degree (e.g., BA, AB, BS)  Occupational History   Occupation: retired  Tobacco Use   Smoking status: Former    Packs/day: 2.00    Years: 15.00    Pack years: 30.00    Types: Cigarettes    Quit date: 03/30/1972    Years since quitting: 49.4   Smokeless tobacco: Never  Vaping Use   Vaping Use: Never used  Substance and Sexual Activity   Alcohol use: Not Currently    Comment: 2-3 drinks/night, none since 04/2020   Drug use: Never   Sexual activity: Not Currently    Birth control/protection: None  Other Topics Concern   Not on file  Social History Narrative   Widowed, wife died GBM 2015-07-05 treated by Dr. Alvy Bimler and Dr. Tammi Klippel    2 daughters, [name] (lives in [place], commutes to [place], primary health contact for pt) and [name]   Right handed   Retired   Drinks coffee 1 cup a day, NO Tea NO soda   Social Determinants of Radio broadcast assistant Strain: Not on file  Food Insecurity: Not on file  Transportation Needs: Not  on file  Physical Activity: Not on file  Stress: Not on file  Social Connections: Not on file  Intimate Partner Violence: Not on file    FAMILY HISTORY: Family History  Problem Relation Age of Onset   Ovarian cancer Mother 79   Cancer Mother    Prostate cancer Father        dx > 23   Cancer Father    Healthy Brother    Hashimoto's thyroiditis Daughter        youngest daughter   Breast cancer Daughter 33   Stomach cancer Cousin        d. 19; maternal male cousin   Colon cancer Paternal Grandmother        d. 94s   Cancer Daughter     Review of Systems  Constitutional:  Positive for fatigue (Mild). Negative for appetite change, chills, fever and unexpected weight change.  HENT:   Negative for hearing loss, lump/mass and trouble swallowing.   Eyes:  Negative for eye problems and icterus.  Respiratory:  Negative for chest tightness, cough and shortness of breath.    Cardiovascular:  Negative for chest pain, leg swelling and palpitations.  Gastrointestinal:  Negative for abdominal distention, abdominal pain, constipation, diarrhea, nausea and vomiting.  Endocrine: Negative for hot flashes.  Genitourinary:  Negative for difficulty urinating.   Musculoskeletal:  Negative for arthralgias.  Skin:  Negative for itching and rash.  Neurological:  Negative for dizziness, extremity weakness, headaches and numbness.  Hematological:  Negative for adenopathy. Does not bruise/bleed easily.  Psychiatric/Behavioral:  Negative for depression. The patient is not nervous/anxious.      PHYSICAL EXAMINATION  ECOG PERFORMANCE STATUS: 1 - Symptomatic but completely ambulatory  Vitals:   08/21/21 0833  BP: 131/67  Pulse: 78  Resp: 18  Temp: (!) 97.4 F (36.3 C)  SpO2: 100%    Physical Exam  LABORATORY DATA:  CBC    Component Value Date/Time   WBC 2.9 (L) 08/21/2021 0806   RBC 3.04 (L) 08/21/2021 0806   HGB 10.5 (L) 08/21/2021 0806   HGB 11.9 (L) 06/30/2021 0923   HCT 30.6 (L) 08/21/2021 0806   PLT 148 (L) 08/21/2021 0806   PLT 168 06/30/2021 0923   MCV 100.7 (H) 08/21/2021 0806   MCH 34.5 (H) 08/21/2021 0806   MCHC 34.3 08/21/2021 0806   RDW 16.4 (H) 08/21/2021 0806   LYMPHSABS 0.8 08/21/2021 0806   MONOABS 0.4 08/21/2021 0806   EOSABS 0.2 08/21/2021 0806   BASOSABS 0.1 08/21/2021 0806    CMP     Component Value Date/Time   NA 140 08/21/2021 0806   K 3.9 08/21/2021 0806   CL 113 (H) 08/21/2021 0806   CO2 20 (L) 08/21/2021 0806   GLUCOSE 115 (H) 08/21/2021 0806   BUN 21 08/21/2021 0806   CREATININE 1.01 08/21/2021 0806   CREATININE 1.11 06/30/2021 0923   CREATININE 0.99 09/17/2020 1050   CALCIUM 8.5 (L) 08/21/2021 0806   PROT 6.6 08/21/2021 0806   ALBUMIN 3.7 08/21/2021 0806   AST 27 08/21/2021 0806   AST 23 06/30/2021 0923   ALT 17 08/21/2021 0806   ALT 9 06/30/2021 0923   ALKPHOS 72 08/21/2021 0806   BILITOT 0.6 08/21/2021 0806    BILITOT 0.8 06/30/2021 0923   GFRNONAA >60 08/21/2021 0806   GFRNONAA >60 06/30/2021 0923   GFRAA >60 12/29/2018 2032     ASSESSMENT and THERAPY PLAN:   Primary adenocarcinoma of distal third of esophagus ([place]) Randy Wood  is an 81 year old male who is here today for follow-up and treatment of his stage IVb esophageal adenocarcinoma.  He is currently on treatment with Taxol and Cyramza.  His labs thus far are stable.  He does struggle with leaving urine first thing when he gets here, so his urine protein is not resulted since he has not been able to leave a sample.  I let him know that he can go ahead and start in the first part of his treatment with the Taxol and we can get the urine going prior to his Cyramza.    Randy Wood's weight is stable and he has no significant side effects at this point.  We did provide him with the necessary paperwork so he can obtain a handicap placard today.  We will see Randy Wood back in 2 weeks for labs, follow-up, and his next treatment.  He and his daughter verbalized understanding of the above plan.   All questions were answered. The patient knows to call the clinic with any problems, questions or concerns. We can certainly see the patient much sooner if necessary.  Total encounter time:20 minutes*in face-to-face visit time, chart review, lab review, care coordination, order entry, and documentation of the encounter time.  Wilber Bihari, NP 08/21/21 9:11 AM Medical Oncology and Hematology [facility] [place], [place] [address] Tel. [phone]    Fax. [phone]  *Total Encounter Time as defined by the Centers for Medicare and Medicaid Services includes, in addition to the face-to-face time of a patient visit (documented in the note above) non-face-to-face time: obtaining and reviewing outside history, ordering and reviewing medications, tests or procedures, care coordination (communications with other health care professionals or  caregivers) and documentation in the medical record.

## 2021-08-21 NOTE — Patient Instructions (Signed)
[facility]   Discharge Instructions: Thank you for choosing [facility] to provide your oncology and hematology care.   If you have a lab appointment with the [facility], please go directly to the [facility] and check in at the registration area.   Wear comfortable clothing and clothing appropriate for easy access to any Portacath or PICC line.   We strive to give you quality time with your provider. You may need to reschedule your appointment if you arrive late (15 or more minutes).  Arriving late affects you and other patients whose appointments are after yours.  Also, if you miss three or more appointments without notifying the office, you may be dismissed from the clinic at the provider's discretion.      For prescription refill requests, have your pharmacy contact our office and allow 72 hours for refills to be completed.    Today you received the following chemotherapy and/or immunotherapy agents: ramucirumab and paclitaxel      To help prevent nausea and vomiting after your treatment, we encourage you to take your nausea medication as directed.  BELOW ARE SYMPTOMS THAT SHOULD BE REPORTED IMMEDIATELY: *FEVER GREATER THAN 100.4 F (38 C) OR HIGHER *CHILLS OR SWEATING *NAUSEA AND VOMITING THAT IS NOT CONTROLLED WITH YOUR NAUSEA MEDICATION *UNUSUAL SHORTNESS OF BREATH *UNUSUAL BRUISING OR BLEEDING *URINARY PROBLEMS (pain or burning when urinating, or frequent urination) *BOWEL PROBLEMS (unusual diarrhea, constipation, pain near the anus) TENDERNESS IN MOUTH AND THROAT WITH OR WITHOUT PRESENCE OF ULCERS (sore throat, sores in mouth, or a toothache) UNUSUAL RASH, SWELLING OR PAIN  UNUSUAL VAGINAL DISCHARGE OR ITCHING   Items with * indicate a potential emergency and should be followed up as soon as possible or go to the Emergency Department if any problems should occur.  Please show the CHEMOTHERAPY ALERT CARD or IMMUNOTHERAPY ALERT  CARD at check-in to the Emergency Department and triage nurse.  Should you have questions after your visit or need to cancel or reschedule your appointment, please contact [facility]  Dept: [phone]  and follow the prompts.  Office hours are 8:00 a.m. to 4:30 p.m. Monday - Friday. Please note that voicemails left after 4:00 p.m. may not be returned until the following business day.  We are closed weekends and major holidays. You have access to a nurse at all times for urgent questions. Please call the main number to the clinic Dept: [phone] and follow the prompts.   For any non-urgent questions, you may also contact your provider using MyChart. We now offer e-Visits for anyone 99 and older to request care online for non-urgent symptoms. For details visit [email or web address].   Also download the MyChart app! Go to the app store, search "MyChart", open the app, select [facility], and log in with your MyChart username and password.  Due to Covid, a mask is required upon entering the hospital/clinic. If you do not have a mask, one will be given to you upon arrival. For doctor visits, patients may have 1 support person aged 76 or older with them. For treatment visits, patients cannot have anyone with them due to current Covid guidelines and our immunocompromised population.

## 2021-08-21 NOTE — Progress Notes (Signed)
Patient unable to provide urine specimen. Per Dr Burr Medico, ok to treat today with urine protein.

## 2021-08-21 NOTE — Assessment & Plan Note (Signed)
Randy Wood is an 81 year old male who is here today for follow-up and treatment of his stage IVb esophageal adenocarcinoma.  He is currently on treatment with Taxol and Cyramza.  His labs thus far are stable.  He does struggle with leaving urine first thing when he gets here, so his urine protein is not resulted since he has not been able to leave a sample.  I let him know that he can go ahead and start in the first part of his treatment with the Taxol and we can get the urine going prior to his Cyramza.    Randy Wood's weight is stable and he has no significant side effects at this point.  We did provide him with the necessary paperwork so he can obtain a handicap placard today.  We will see Randy Wood back in 2 weeks for labs, follow-up, and his next treatment.  He and his daughter verbalized understanding of the above plan.

## 2021-08-21 NOTE — Progress Notes (Signed)
Orders placed for IV fluids with treatment today per NP

## 2021-08-28 ENCOUNTER — Other Ambulatory Visit: Payer: Medicare Other

## 2021-08-28 ENCOUNTER — Ambulatory Visit: Payer: Medicare Other

## 2021-08-30 NOTE — Progress Notes (Signed)
[facility] OFFICE PROGRESS NOTE  [name], [name], [place] [address]  DIAGNOSIS: f/u of esophageal cancer  Oncology History Overview Note  Cancer Staging No matching staging information was found for the patient.    Primary adenocarcinoma of distal third of esophagus ([facility])  11/11/2020 Procedure   EGD by Dr. Watt Climes - Normal larynx. -? Malignant-appearing esophageal stenosis. Biopsied. - Pills were found in the esophagus. Removal was successful. - Rule out malignancy,? gastric tumor in the cardia difficult to say based on bleeding from passing the scope and unable to wash and suction for adequate visualization. - Normal ampulla, duodenal bulb, first portion of the duodenum, second portion of the duodenum, major papilla and area of the papilla. - The examination was otherwise normal.   11/11/2020 Initial Biopsy   FINAL MICROSCOPIC DIAGNOSIS:  A. ESOPHAGUS, DISTAL, BIOPSY:  -  Poorly differentiated adenocarcinoma  -  See comment    11/11/2020 Initial Diagnosis   Primary adenocarcinoma of distal third of esophagus ([facility])   11/11/2020 Cancer Staging   Staging form: Esophagus - Adenocarcinoma, AJCC 8th Edition - Clinical stage from 11/11/2020: Stage IVB (cTX, cN1, cM1) - Signed by [name], MD on 12/26/2020    11/14/2020 Imaging   CT CAPIMPRESSION: 1. Aggressive appearing new tumor of the gastroesophageal junction confluent with the irregular collection of right gastric lymph nodes, and with new associated metastatic retroperitoneal adenopathy. Small paraesophageal lymph nodes in the thorax are increased in size from prior exam and could also be involved, and there is a nonspecific 1.0 cm lesion inferiorly in the right hepatic lobe which could potentially be metastatic. 2. New subacute burst (AO type A4) fracture of L5, with mild posterior bony retropulsion and with a dominant coronally oriented fracture plane, and about 50% loss of  height. Correlate with interval trauma. 3. Slight thickening along the left paracolic gutter and trace pelvic ascites, although the ascites is markedly reduced compared to 05/04/2020. 4. Other imaging findings of potential clinical significance: Aortic Atherosclerosis (ICD10-I70.0). Coronary atherosclerosis. Old compression fracture at L2.   12/03/2020 PET scan   IMPRESSION: Tumor at the GE junction with local perigastric infiltration, hepatic and nodal metastases.   Nodal involvement both in the chest and abdomen extending to the lower retroperitoneum in the abdomen.   Burst fracture at L5 shows signs of increased metabolic activity, nonspecific in the setting of fracture, in light of other findings would correlate with any history of trauma, if no history of trauma consider MRI to exclude the possibility of pathologic fracture.   12/27/2020 Genetic Testing   Negative hereditary cancer genetic testing: no pathogenic variants detected in [facility] Multi-Cancer +RNA Panel.  The report date is December 27, 2020.   The Multi-Cancer + RNA Panel offered by [facility] includes sequencing and/or deletion/duplication analysis of the following 84 genes:  AIP*, ALK, APC*, ATM*, AXIN2*, BAP1*, BARD1*, BLM*, BMPR1A*, BRCA1*, BRCA2*, BRIP1*, CASR, CDC73*, CDH1*, CDK4, CDKN1B*, CDKN1C*, CDKN2A, CEBPA, CHEK2*, CTNNA1*, DICER1*, DIS3L2*, EGFR, EPCAM, FH*, FLCN*, GATA2*, GPC3, GREM1, HOXB13, HRAS, KIT, MAX*, MEN1*, MET, MITF, MLH1*, MSH2*, MSH3*, MSH6*, MUTYH*, NBN*, NF1*, NF2*, NTHL1*, PALB2*, PDGFRA, PHOX2B, PMS2*, POLD1*, POLE*, POT1*, PRKAR1A*, PTCH1*, PTEN*, RAD50*, RAD51C*, RAD51D*, RB1*, RECQL4, RET, RUNX1*, SDHA*, SDHAF2*, SDHB*, SDHC*, SDHD*, SMAD4*, SMARCA4*, SMARCB1*, SMARCE1*, STK11*, SUFU*, TERC, TERT, TMEM127*, Tp53*, TSC1*, TSC2*, VHL*, WRN*, and WT1.  RNA analysis is performed for * genes.   01/07/2021 - 05/07/2021 Chemotherapy   Patient is on Treatment Plan : GASTROESOPHAGEAL Nivolumab q14d  x 8  cycles / Nivolumab q28d      03/26/2021 Imaging   CT CAP IMPRESSION: 1. Evidence of treatment response. There is decreased size of the irregular infiltrative mass centered at the gastroesophageal junction as well as decreased size of numerous metastatic retroperitoneal and upper abdominal lymph nodes. 2. 2.1 cm hepatic metastasis at the inferior right hepatic lobe segment 6 is stable to slightly increased in size since previous PET-CT. No new hepatic mass visualized. 3. A few new adjacent small pulmonary nodules in the posterior left lower lobe measuring up to 4 mm in size. Continued follow-up recommended. 4. Small right pleural effusion which is new since previous study. Small pericardial effusion increased since previous study. 5. Other ancillary findings as described.     06/30/2021 Imaging   EXAM: CT CHEST, ABDOMEN, AND PELVIS WITH CONTRAST  IMPRESSION: 1. Worsening nodal disease in the chest and abdomen more so within the abdomen as described. 2. Increasing size of RIGHT pleural fluid and pericardial effusion. Now moderate. 3. Increasing size of RIGHT hepatic lesion. 4. New LEFT adrenal metastasis. 5. Increasing size of retroperitoneal adenopathy. 6. Obstruction of the RIGHT ureter may be related to a metastatic lesion to the RIGHT ureter, blood clot or primary urothelial lesion and is associated with mild hydronephrosis, moderate ureteral dilation and potential filling defect also in lower pole calyces of the RIGHT kidney. 7. Trace perihepatic ascites. 8. Aortic atherosclerosis.   07/17/2021 -  Chemotherapy   Patient is on Treatment Plan : GASTROESOPHAGEAL Ramucirumab D1, 15  / PACLitaxel D1,8,15 q28d        CURRENT THERAPY: Taxol and ramucirumab, days 1, 8, 15 q21d, starting 07/17/21             -ramucirumab held for cycle 1 D1 and D8. Treatment changed to every other week due to intolerance.   INTERVAL HISTORY: Randy Wood 81 y.o. male returns to the clinic today for  a follow-up visit accompanied by his daughter. His treatment with taxol and cyramza was changed to being every 2 weeks due to intolerance. He has tolerated this much better. Overall, he denies any new concerns today except starting on Sunday, his scalp is itchy with a several small scabs. These scabs are tender to the touch.   Otherwise, his daughter mentions his exercise tolerance has decreased. She notes when he walks from one place to the other, she believes he seems more short of breath. The patient denies any significant shortness of breath. He denies sick contacts, calf swelling or erythema, fevers, or sore throat. He states he has chronic nasal congestion in one nostril for which he uses azelastin. This is unchanged. He also has a baseline "deep cough" which is also not different from his baseline. He does not feel that he needs an xray today.  Otherwise, he denies night sweats or weight loss. He has a good appetite. He reports he is eating well. He denies any pain, bloating, or abdominal pain. He states he has a lot of gas at baseline but nothing unusual recently. Denies any dysphagia or odynophagia. Denies any dysuria. Denies any nausea, vomiting, diarrhea, or constipation.  Denies any chest pain. He mentioned at his last appointment he had some lower extremity muscle cramping. He has been trying to hydrate more. He had some hypocalcemia at that time as well per chart review.  He is here today for evaluation and repeat blood work before starting cycle #3.      MEDICAL HISTORY: Past Medical History:  Diagnosis Date   AKI (acute kidney injury) ([facility])    Alcohol abuse    Arthritis    Cataract 2006   Diarrhea    Edema leg    Esophageal cancer ([facility])    Family history of breast cancer 12/09/2020   Family history of ovarian cancer 12/09/2020   Family history of prostate cancer 12/09/2020   GERD (gastroesophageal reflux disease)    Glaucoma unknown   Hyperbilirubinemia    Hyperlipemia     Hypertension    Hypokalemia    Liver failure ([facility])    Macular degeneration, wet ([facility])    Microcytic anemia    Osteoarthritis    SOB (shortness of breath)    Weight gain     ALLERGIES:  is allergic to lisinopril.  MEDICATIONS:  Current Outpatient Medications  Medication Sig Dispense Refill   Azelastine HCl 137 MCG/SPRAY SOLN INSTILL 2 SPRAYS INTO EACH NOSTRIL TWICE A DAY AS DIRECTED 30 mL 1   cetirizine (ZYRTEC) 10 MG tablet Take 10 mg by mouth daily.     dorzolamide-timolol (COSOPT) 22.3-6.8 MG/ML ophthalmic solution Place 1 drop into the left eye 2 (two) times daily.     EPINEPHrine 0.3 mg/0.3 mL IJ SOAJ injection Inject 0.3 mg into the muscle as needed for anaphylaxis. (Patient not taking: Reported on 07/17/2021)     ezetimibe (ZETIA) 10 MG tablet Take 1 tablet (10 mg total) by mouth daily. 90 tablet 3   GLUCOSAMINE-CHONDROITIN PO Take 1 capsule by mouth daily.     lactulose (CHRONULAC) 10 GM/15ML solution Take 15 mLs (10 g total) by mouth daily as needed for mild constipation. 236 mL 0   latanoprost (XALATAN) 0.005 % ophthalmic solution Place 1 drop into the left eye at bedtime.     levothyroxine (SYNTHROID) 50 MCG tablet Take 1 tablet (50 mcg total) by mouth daily. 30 tablet 6   lidocaine-prilocaine (EMLA) cream Apply 1 application. topically as needed. (Patient not taking: Reported on 07/17/2021) 30 g 0   moxifloxacin (VIGAMOX) 0.5 % ophthalmic solution      Multiple Vitamins-Minerals (ONE-A-DAY MENS 50+) TABS Take 1 tablet by mouth daily.     Multiple Vitamins-Minerals (PRESERVISION AREDS 2 PO) Take 1 tablet by mouth 2 (two) times daily.     ondansetron (ZOFRAN) 8 MG tablet Take 1 tablet (8 mg total) by mouth every 8 (eight) hours as needed for nausea or vomiting. 30 tablet 2   prochlorperazine (COMPAZINE) 10 MG tablet Take 1 tablet (10 mg total) by mouth every 6 (six) hours as needed. 30 tablet 2   spironolactone (ALDACTONE) 50 MG tablet TAKE ONE TABLET BY MOUTH DAILY 90 tablet 0    sucralfate (CARAFATE) 1 g tablet Take 1 tablet (1 g total) by mouth 4 (four) times daily. Dissolve each tablet in 15 cc water before use. 120 tablet 2   No current facility-administered medications for this visit.    SURGICAL HISTORY:  Past Surgical History:  Procedure Laterality Date   ANKLE SURGERY     BIOPSY  11/11/2020   Procedure: BIOPSY;  Surgeon: [name], MD;  Location: [facility];  Service: Gastroenterology;;   CATARACT EXTRACTION, BILATERAL     ESOPHAGOGASTRODUODENOSCOPY (EGD) WITH PROPOFOL N/A 11/11/2020   Procedure: ESOPHAGOGASTRODUODENOSCOPY (EGD) WITH PROPOFOL;  Surgeon: [name], MD;  Location: [facility];  Service: Gastroenterology;  Laterality: N/A;   EYE SURGERY  2006   FOREIGN BODY REMOVAL  11/11/2020   Procedure: FOREIGN BODY REMOVAL;  Surgeon: [name], MD;  Location: [facility];  Service: Gastroenterology;;   IR IMAGING GUIDED PORT INSERTION  07/16/2021   IR PARACENTESIS  05/09/2020   TONSILLECTOMY      REVIEW OF SYSTEMS:   Review of Systems  Constitutional: Negative for appetite change, chills, fatigue, fever and unexpected weight change.  HENT: Positive for chronic nasal congestion.  Negative for mouth sores, nosebleeds, sore throat and trouble swallowing.   Eyes: Negative for eye problems and icterus.  Respiratory: Positive for baseline "deep" cough.  Negative for hemoptysis, shortness of breath and wheezing.   Cardiovascular: Negative for chest pain and leg swelling.  Gastrointestinal: Negative for abdominal pain, constipation, diarrhea, nausea and vomiting.  Genitourinary: Negative for bladder incontinence, difficulty urinating, dysuria, frequency and hematuria.   Musculoskeletal: Positive for muscle cramps (improved compared to prior). negative for back pain, gait problem, neck pain and neck stiffness.  Skin: Positive for scabs and itching on scalp. Neurological: Negative for dizziness, extremity weakness, gait problem, headaches,  light-headedness and seizures.  Hematological: Negative for adenopathy. Does not bruise/bleed easily.  Psychiatric/Behavioral: Negative for confusion, depression and sleep disturbance. The patient is not nervous/anxious.     PHYSICAL EXAMINATION:  There were no vitals taken for this visit.  ECOG PERFORMANCE STATUS: 1  Physical Exam  Constitutional: Oriented to person, place, and time and well-developed, well-nourished, and in no distress.  HENT:  Head: Normocephalic and atraumatic.  Mouth/Throat: Oropharynx is clear and moist. No oropharyngeal exudate.  Eyes: Conjunctivae are normal. Right eye exhibits no discharge. Left eye exhibits no discharge. No scleral icterus.  Neck: Normal range of motion. Neck supple.  Cardiovascular: Normal rate, regular rhythm, normal heart sounds and intact distal pulses.   Pulmonary/Chest: Effort normal and breath sounds normal. No respiratory distress. No wheezes. No rales.  Abdominal: Soft. Bowel sounds are normal. Exhibits no distension and no mass. There is no tenderness.  Musculoskeletal: Normal range of motion. Exhibits no edema.  Lymphadenopathy:    No cervical adenopathy.  Neurological: Alert and oriented to person, place, and time. Exhibits normal muscle tone. Gait normal. Coordination normal.  Skin: Mild folliculitis around scalp with small sores/scabs. Skin is warm and dry. No Not diaphoretic. No erythema. No pallor.  Psychiatric: Mood, memory and judgment normal.  Vitals reviewed.  LABORATORY DATA: Lab Results  Component Value Date   WBC 2.9 (L) 08/21/2021   HGB 10.5 (L) 08/21/2021   HCT 30.6 (L) 08/21/2021   MCV 100.7 (H) 08/21/2021   PLT 148 (L) 08/21/2021      Chemistry      Component Value Date/Time   NA 140 08/21/2021 0806   K 3.9 08/21/2021 0806   CL 113 (H) 08/21/2021 0806   CO2 20 (L) 08/21/2021 0806   BUN 21 08/21/2021 0806   CREATININE 1.01 08/21/2021 0806   CREATININE 1.11 06/30/2021 0923   CREATININE 0.99 09/17/2020  1050      Component Value Date/Time   CALCIUM 8.5 (L) 08/21/2021 0806   ALKPHOS 72 08/21/2021 0806   AST 27 08/21/2021 0806   AST 23 06/30/2021 0923   ALT 17 08/21/2021 0806   ALT 9 06/30/2021 0923   BILITOT 0.6 08/21/2021 0806   BILITOT 0.8 06/30/2021 0923       RADIOGRAPHIC STUDIES:  No results found.   ASSESSMENT/PLAN:  Randy Wood is a 81 y.o. male with    1. Locally advanced adenocarcinoma of the distal esophagus/GE junction, with liver and nodal metastasis, HER2 negative, MMR normal, PD-L1 3% -He presented with progressive dysphagia and weight  loss, work-up showed moderate stenosis in the distal third of the esophagus invading the gastric cardia, path 11/25/20 confirmed poorly differentiated adenocarcinoma.  -PET 12/03/20 showed: esophageal tumor with local perigastric infiltration; hepatic and nodal metastasis involving the chest and abdomen extending to lower retroperitoneum; burst fracture at L5 with increased metabolic activity -Molecular testing showed MMR normal, PD-L1 CPS 3% -He received palliative radiation to the primary tumor 9/15 - 12/27/20 -He began single agent Xeloda 1 week on/1 week off and nivolumab every 2 weeks on 01/07/21, tolerating well -his thyroid function has became abnormal on nivolumab and was referred to Dr. Kelton Pillar.  -restaging CT on 06/30/21 showed worsening disease within nodes in chest and abdomen, liver, adrenal gland, and retroperitoneal nodes.  -we switched to taxol on 07/17/21, port placed the day before. He experienced abdominal cramps and diarrhea for 4-5 days after day 8. Treatment changed to every other week due to intolerance.  -He is here for day 1 cycle #3 today. Labs were reviewed.  His labs show neutropenia today with a total white blood cell count low at 2.0.  His ANC is 0.7.  Reviewed with my colleague [name], who works closely with Dr. Burr Medico.  Lacie does not recommend any short acting G-CSF at this time.  We will cancel treatment today  and delay his infusion by 1 week.   -We will see him back for follow-up visit and repeat lab work in 1 week.  If improvement in his neutropenia, we will discuss resuming treatment that day.    2. Folliculitis -Patient reports itchy scalp with small tender sores.  This started earlier this week on around 09/01/2021 -Given that the sores are across his entire scalp, would not recommend topical antibiotic as he would need a large quantity of topical to cover this region -Given that he is neutropenic today we will send a prescription for doxycycline 100 milligrams p.o. for 10 days.  The patient is well versed in the adverse side effects of doxycycline as he was previously taking this for years for rosacea.  He knows to stay out of the sun while he is undergoing treatment with this. -I also encouraged him to use either Selsun Blue or head and shoulders for the itchy scalp.  3.  Neutropenia Labs today show total white blood cell count of 2.0 and ANC at 0.7 -He denies any new signs or symptoms of infection at this time except for the sores on his scalp mentioned in #2 -We reviewed neutropenic precautions today.  Advised patient to call us if he develops any fever, upper respiratory infection, shortness of breath, worsening cough, skin infections, dysuria, abdominal pain, diarrhea, etc. encouraged him to avoid large gatherings and to wear his mask and wash his hands thoroughly. -Reviewed his labs with my colleague [name], who works closely with Dr. Burr Medico who does not recommend G-CSF injections. -The patient's daughter mentioned that the patient seems to have more dyspnea/deconditioning with exertion.  The patient denies any significant changes with his breathing.  I did offer a chest x-ray today but he declined.  4.  Side effects (anemia, muscle cramps, electrolyte derangements, etc. -Patient's iron studies show low ferritin and low iron and saturation today. -Patient is not taking iron supplement at this  time. -Called the patient encouraged him to take iron over-the-counter. -The patient mentions that he had muscle cramps at his last appointment.  He did have some worsening hypocalcemia at this time.  He has mild hypocalcemia today.  Encouraged to increase his  intake of calcium rich foods and increase his hydration which will likely help with muscle cramps.  They have improved compared to his last appointment.   4. Hyperthyroidism  -secondary to nivolumab -initially noted on 05/07/21 labs-- T4 17.5 and TSH <0.08. He is asymptomatic. -he established care with Dr. Kelton Pillar on 06/11/21. Repeat labs on 07/15/21 showed TSH 70.63 and free T4 0.31. He was started on synthroid on 07/16/21.   5. Cirrhosis, abdominal pain -felt to be alcohol-related. He has abstained from alcohol since Jun 03, 2020 -S/p paracentesis 05/09/20 yielding 4.4 liters, path showed reactive mesothelial cells -Compensated, hepatic function panel 09/17/20 WNL -Continue spironolactone.  Hold Lasix -followed by GI   6. Social support -He is widowed and lives alone, his wife died from [place] in 04-Jun-2015, she was treated here -2 daughters live out of town, [name] lives in [place] and is the closest, also his primary contact for healthcare -He has church community and some family friends who could help if needed -He has met outpatient palliative care -Patient is independent with ADLs but has a Secretary/administrator and yard service.    25. Family history  -Patient's mother had ovarian cancer, father had prostate cancer, and a daughter has breast cancer at age 24 -His daughter [name] without cancer underwent genetic testing and is negative -He was referred to genetics and underwent testing 12/09/20, results were negative   7. Glaucoma -The patient reports that Flonase was discontinued by his glaucoma specialist Dr. Ander Slade at the [facility] eye clinic in [place].  This was due to it contributing to increase in intraocular pressure. -his intraocular pressure  is being checked by Dr. Ander Slade, next on 09/08/21.     PLAN: -hold chemo today due to neutropenia -Delay treatment by 1 week -Doxycycline 100 mg twice daily sent to pharmacy for folliculitis -Neutropenic precautions reviewed -Encouraged to start taking iron supplement -Advised to use head and shoulders or Selsun Blue -Call if any new or worsening symptoms of infection -lab, flush, and taxol and ramucizumab in 1, 3, and 5 weeks.   No orders of the defined types were placed in this encounter.     The total time spent in the appointment was 30-39 minutes.   Damean Poffenberger L Tanaka Gillen, PA-C 08/30/21

## 2021-09-01 NOTE — Progress Notes (Signed)
Cardiology Office Note:    Date:  09/05/2021   ID:  Randy Wood, DOB 12/29/1940, MRN [id number]  PCP:  [name], Raymond J, MD   [place]  Cardiologist:  None  Advanced Practice Provider:  No care team member to display Electrophysiologist:  None    Referring MD: [name], [name], MD    History of Present Illness:    Randy Wood is a 81 y.o. male with a hx of HTN, HLD, alcohol abuse and alcoholic cirrhosis who returns to clinic for follow-up.  Patient admitted to [facility] from 05/03/20-05/11/20 for worsening SOB, anasarca and leg pain/wekness found to have decompensated liver disease with significant ascites and renal failure. During admission, patient underwent paracentesis with 5.2L of fluid removed. Peritoneal fluid with ESBL E.Coli for which he was started on meropenem. Was diuresed with IV lasix later transitioned to PO. Renal function improved with fluid removal and diuresis. TTE with normal LVEF, G1DD. LE doppler negative for LE DVT.  Seen in clinic on 05/24/20 where he was feeling much better with improved LE edema. Blood pressure controlled and patient was compliant with low Na diet. No anginal symptoms. No changes to meds made at that time.  Last seen in clinic in 02/2021 where he developed progressive difficulties with swallowing and suffered from profound weight loss (70lbs). He was subsequently diagnosed with esophageal adenocarcinoma and is currently undergoing chemo (xeloda), XRT, and immunologic therapy with Dr. Burr Medico. He was otherwise stable from a CV standpoint. TTE with strain 03/2021 with normal LVEF >75%, GLS -26%, normal RV, no significant valve disease.  Today, the patient overall feels okay. He feels a little wiped out with the chemotherapy. His swallowing is significantly improved. Has gained weight back. No chest pain, lightheadedness, dizziness, LE edema or orthopnea. Has some mild dyspnea on exertion. Notably CT abdomen/pelvis that was  obtained for serial monitoring demonstrated a moderate pericardial effusion as well as aortic atherosclerosis.     Past Medical History:  Diagnosis Date   AKI (acute kidney injury) ([place])    Alcohol abuse    Arthritis    Cataract 2006   Diarrhea    Edema leg    Esophageal cancer ([place])    Family history of breast cancer 12/09/2020   Family history of ovarian cancer 12/09/2020   Family history of prostate cancer 12/09/2020   GERD (gastroesophageal reflux disease)    Glaucoma unknown   Hyperbilirubinemia    Hyperlipemia    Hypertension    Hypokalemia    Liver failure ([facility])    Macular degeneration, wet ([facility])    Microcytic anemia    Osteoarthritis    SOB (shortness of breath)    Weight gain     Past Surgical History:  Procedure Laterality Date   ANKLE SURGERY     BIOPSY  11/11/2020   Procedure: BIOPSY;  Surgeon: [name], MD;  Location: [facility];  Service: Gastroenterology;;   CATARACT EXTRACTION, BILATERAL     ESOPHAGOGASTRODUODENOSCOPY (EGD) WITH PROPOFOL N/A 11/11/2020   Procedure: ESOPHAGOGASTRODUODENOSCOPY (EGD) WITH PROPOFOL;  Surgeon: [name], MD;  Location: [facility];  Service: Gastroenterology;  Laterality: N/A;   EYE SURGERY  2006   FOREIGN BODY REMOVAL  11/11/2020   Procedure: FOREIGN BODY REMOVAL;  Surgeon: [name], MD;  Location: [facility];  Service: Gastroenterology;;   IR IMAGING GUIDED PORT INSERTION  07/16/2021   IR PARACENTESIS  05/09/2020   TONSILLECTOMY      Current Medications: Current  Meds  Medication Sig   Azelastine HCl 137 MCG/SPRAY SOLN INSTILL 2 SPRAYS INTO EACH NOSTRIL TWICE A DAY AS DIRECTED   cetirizine (ZYRTEC) 10 MG tablet Take 10 mg by mouth daily.   dorzolamide-timolol (COSOPT) 22.3-6.8 MG/ML ophthalmic solution Place 1 drop into the left eye 2 (two) times daily.   doxycycline (VIBRA-TABS) 100 MG tablet Take 1 tablet (100 mg total) by mouth 2 (two) times daily.   EPINEPHrine 0.3 mg/0.3 mL IJ SOAJ injection Inject 0.3  mg into the muscle as needed for anaphylaxis.   ezetimibe (ZETIA) 10 MG tablet Take 1 tablet (10 mg total) by mouth daily.   Ferrous Sulfate (IRON SLOW RELEASE) 142 (45 Fe) MG TBCR Take 1 tablet by mouth daily.   GLUCOSAMINE-CHONDROITIN PO Take 1 capsule by mouth daily.   lactulose (CHRONULAC) 10 GM/15ML solution Take 15 mLs (10 g total) by mouth daily as needed for mild constipation.   latanoprost (XALATAN) 0.005 % ophthalmic solution Place 1 drop into the left eye at bedtime.   levothyroxine (SYNTHROID) 50 MCG tablet Take 1 tablet (50 mcg total) by mouth daily.   lidocaine-prilocaine (EMLA) cream Apply 1 application. topically as needed.   moxifloxacin (VIGAMOX) 0.5 % ophthalmic solution    Multiple Vitamins-Minerals (ONE-A-DAY MENS 50+) TABS Take 1 tablet by mouth daily.   Multiple Vitamins-Minerals (PRESERVISION AREDS 2 PO) Take 1 tablet by mouth 2 (two) times daily.   ondansetron (ZOFRAN) 8 MG tablet Take 1 tablet (8 mg total) by mouth every 8 (eight) hours as needed for nausea or vomiting.   prochlorperazine (COMPAZINE) 10 MG tablet Take 1 tablet (10 mg total) by mouth every 6 (six) hours as needed.   spironolactone (ALDACTONE) 50 MG tablet TAKE ONE TABLET BY MOUTH DAILY   sucralfate (CARAFATE) 1 g tablet Take 1 tablet (1 g total) by mouth 4 (four) times daily. Dissolve each tablet in 15 cc water before use.     Allergies:   Lisinopril   Social History   Socioeconomic History   Marital status: Widowed    Spouse name: Not on file   Number of children: 2   Years of education: Not on file   Highest education level: Bachelor's degree (e.g., BA, AB, BS)  Occupational History   Occupation: retired  Tobacco Use   Smoking status: Former    Packs/day: 2.00    Years: 15.00    Total pack years: 30.00    Types: Cigarettes    Quit date: 03/30/1972    Years since quitting: 49.4   Smokeless tobacco: Never  Vaping Use   Vaping Use: Never used  Substance and Sexual Activity   Alcohol use:  Not Currently    Comment: 2-3 drinks/night, none since 04/2020   Drug use: Never   Sexual activity: Not Currently    Birth control/protection: None  Other Topics Concern   Not on file  Social History Narrative   Widowed, wife died GBM 06-19-2015 treated by Dr. Alvy Bimler and Dr. Tammi Klippel    2 daughters, [name] (lives in [place], commutes to [place], primary health contact for pt) and [name]   Right handed   Retired   Drinks coffee 1 cup a day, NO Tea NO soda   Social Determinants of Radio broadcast assistant Strain: Not on file  Food Insecurity: Not on file  Transportation Needs: Not on file  Physical Activity: Not on file  Stress: Not on file  Social Connections: Not on file     Family History: The  patient's family history includes Breast cancer (age of onset: 24) in his daughter; Cancer in his daughter, father, and mother; Colon cancer in his paternal grandmother; Hashimoto's thyroiditis in his daughter; Healthy in his brother; Ovarian cancer (age of onset: 37) in his mother; Prostate cancer in his father; Stomach cancer in his cousin.  ROS:   Please see the history of present illness.    Review of Systems  Constitutional:  Positive for malaise/fatigue. Negative for chills, fever and weight loss.  HENT:  Negative for hearing loss.   Eyes:  Negative for blurred vision and redness.  Respiratory:  Positive for shortness of breath.   Cardiovascular:  Negative for chest pain, palpitations, orthopnea, claudication, leg swelling and PND.  Gastrointestinal:  Negative for abdominal pain, melena, nausea and vomiting.  Genitourinary:  Negative for dysuria and hematuria.  Musculoskeletal:  Positive for myalgias.  Skin:  Negative for rash.  Neurological:  Negative for dizziness and loss of consciousness.    EKGs/Labs/Other Studies Reviewed:    The following studies were reviewed today: CT abd/pelvis 06/2021: FINDINGS: CT CHEST FINDINGS   Cardiovascular: Calcified aortic  atherosclerosis and noncalcified aortic atherosclerosis, no signs of aneurysm. Central pulmonary vessels are unremarkable on venous phase. Heart size is stable with increase in now small to moderate pericardial effusion measuring approximately 11 mm greatest thickness as compared to 5-6 mm on the previous exam. No pericardial nodularity.   Mediastinum/Nodes: RIGHT hilar adenopathy, bulky hilar adenopathy measuring 2.6 cm short axis increased from previous image (image 30/2) no thoracic inlet or axillary lymphadenopathy. LEFT hilum with mild prominence of a LEFT hilar lymph node at 1 cm previously 5 mm (image 32/2) no mediastinal adenopathy. Esophagus with mild thickening and adjacent stranding. Thickening at the GE junction is similar grossly to the prior study. No visible discrete mass.   Lungs/Pleura: Azygous fissure RIGHT upper lobe. Increasing RIGHT-sided pleural effusion now moderate layering dependently. RIGHT basilar atelectasis. Airways are patent.   Musculoskeletal: See below for full musculoskeletal details. Gynecomastia.   CT ABDOMEN PELVIS FINDINGS   Hepatobiliary: Heterogeneity in hepatic subsegment VI appears larger than on the previous study measuring up to 4.4 cm (image 80/2) this previously measured approximately 2.1 cm. No additional lesion is visible in the liver. No pericholecystic stranding. SMV is patent. No biliary duct distension.   Pancreas: Normal, without mass, inflammation or ductal dilatation.   Spleen: Normal.   Adrenals/Urinary Tract: New LEFT adrenal metastasis measuring 26 mm (image 68/2) RIGHT adrenal is normal.   Slightly delayed enhancement and excretion on the RIGHT. Question of filling defect in the lower pole calices of the RIGHT kidney and filling defect along the course of the RIGHT ureter (image 97/2) 10 mm. Urinary bladder with smooth contours. LEFT ureter without signs of dilation. LEFT kidney and RIGHT kidney without  discrete suspicious parenchymal lesion.   Stomach/Bowel: No acute gastrointestinal findings. Normal appendix. Stranding in the upper abdomen in the retroperitoneum and about the GE junction is similar to previous imaging.   Vascular/Lymphatic: Calcified and noncalcified plaque of the abdominal aorta.   Increasing size of retroperitoneal adenopathy   (Image 66/2) 16 mm intra-aortocaval lymph node previously less than a cm.   LEFT common iliac lymph node (image 92/2) 12 mm previously less than a cm. Numerous other lymph nodes with similar enlargement throughout the LEFT para-aortic, intra-aortocaval and retrocaval regions.   Reproductive: Unremarkable by CT.   Other: Trace perihepatic ascites.   Musculoskeletal: Fracture at L5 with similar appearance to the most  recent comparison imaging study. Spinal degenerative changes.   IMPRESSION: 1. Worsening nodal disease in the chest and abdomen more so within the abdomen as described. 2. Increasing size of RIGHT pleural fluid and pericardial effusion. Now moderate. 3. Increasing size of RIGHT hepatic lesion. 4. New LEFT adrenal metastasis. 5. Increasing size of retroperitoneal adenopathy. 6. Obstruction of the RIGHT ureter may be related to a metastatic lesion to the RIGHT ureter, blood clot or primary urothelial lesion and is associated with mild hydronephrosis, moderate ureteral dilation and potential filling defect also in lower pole calyces of the RIGHT kidney. 7. Trace perihepatic ascites. 8. Aortic atherosclerosis.   TTE 04/02/21: IMPRESSIONS     1. Left ventricular ejection fraction, by estimation, is >75%. The left  ventricle has hyperdynamic function. The left ventricle has no regional  wall motion abnormalities. Left ventricular diastolic parameters are  consistent with Grade I diastolic  dysfunction (impaired relaxation). The average left ventricular global  longitudinal strain is 26.0 %. The global longitudinal  strain is normal.   2. Right ventricular systolic function is normal. The right ventricular  size is normal.   3. The mitral valve is normal in structure. No evidence of mitral valve  regurgitation. No evidence of mitral stenosis.   4. The aortic valve is normal in structure. Aortic valve regurgitation is  not visualized. No aortic stenosis is present.   5. The inferior vena cava is normal in size with greater than 50%  respiratory variability, suggesting right atrial pressure of 3 mmHg.   Comparison(s): No significant change from prior study. Prior images  reviewed side by side.  TTE 05/10/20:  1. Left ventricular ejection fraction, by estimation, is 60 to 65%. The  left ventricle has normal function. The left ventricle has no regional  wall motion abnormalities. Left ventricular diastolic parameters are  consistent with Grade I diastolic  dysfunction (impaired relaxation).   2. Right ventricular systolic function is normal. The right ventricular  size is normal. Tricuspid regurgitation signal is inadequate for assessing  PA pressure.   3. The mitral valve is grossly normal. Trivial mitral valve  regurgitation.   4. The aortic valve is tricuspid. Aortic valve regurgitation is not  visualized. Mild aortic valve sclerosis is present, with no evidence of  aortic valve stenosis.   5. The inferior vena cava is normal in size with greater than 50%  respiratory variability, suggesting right atrial pressure of 3 mmHg.   LE Doppler ultrasound 05/02/20: Summary:  RIGHT:  - There is no evidence of deep vein thrombosis in the lower extremity.  - There is no evidence of superficial venous thrombosis.    - No cystic structure found in the popliteal fossa.  LEFT:  - There is no evidence of deep vein thrombosis in the lower extremity.  - There is no evidence of superficial venous thrombosis.  EKG: EKG shows NSR with PACs, LAD   Recent Labs: 07/15/2021: TSH 70.63 09/04/2021: ALT 14; BUN 16;  Creatinine, Ser 0.99; Hemoglobin 10.5; Platelets 155; Potassium 4.0; Sodium 136  Recent Lipid Panel    Component Value Date/Time   CHOL 145 04/01/2021 1034   CHOL 175 05/28/2020 0754   TRIG 64 04/01/2021 1034   HDL 54 04/01/2021 1034   HDL 27 (L) 05/28/2020 0754   CHOLHDL 2.7 04/01/2021 1034   VLDL 13 04/01/2021 1034   LDLCALC 78 04/01/2021 1034   LDLCALC 129 (H) 05/28/2020 0754     Physical Exam:    VS:  BP 116/60  Pulse 83   Ht '5\' 11"'$  (1.803 m)   Wt 185 lb 12.8 oz (84.3 kg)   SpO2 97%   BMI 25.91 kg/m     Wt Readings from Last 3 Encounters:  09/05/21 185 lb 12.8 oz (84.3 kg)  09/04/21 185 lb 1 oz (83.9 kg)  08/21/21 181 lb 3.2 oz (82.2 kg)     GEN:  Well nourished, well developed in no acute distress HEENT: Normal NECK: No JVD; No carotid bruits CARDIAC: RRR, no murmurs, rubs, gallops RESPIRATORY:  Clear to auscultation without rales, wheezing or rhonchi  ABDOMEN: Soft, non-tender, non-distended MUSCULOSKELETAL:  No edema; No deformity  SKIN: Warm and dry NEUROLOGIC:  Alert and oriented x 3 PSYCHIATRIC:  Normal affect   ASSESSMENT:    1. Pericardial effusion   2. History of chemotherapy     PLAN:    In order of problems listed above:  #Alcoholic Cirrhosis: #DOE #LE Edema: Patient with hospitalization in 04/2020 for anasarca, LE edema and dyspnea found to have decompensated liver cirrhosis secondary to alcohol. Was successfully diuresed with IV lasix and underwent paracentesis with significant improvement. TTE with normal EF, G1DD, no significant valvular disease. Suspect symptoms were driven by alcoholic cirrhosis which have now significantly improved. Appears euvolemic today. -Continue management of cirrhosis per hepatology -Continue spironolactone '50mg'$  daily -Off lasix due to low K; maintaining good fluid status off lasix -LE dopplers negative; TTE with normal BiV function; no significant valvular disease -Continue low Na diet -Monitoring daily  weights -Continue alcohol cessation  #Moderate Pericardial Effusion: Measured 88m on CT. Asymptomatic. Will check TTE for monitoring. -Check TTE for monitoring   #HTN: Stable and well controlled.  -Continue spironolactone '50mg'$  daily -Monitor for hypotension at home and adjust as needed    #HLD: LDL 129 in 05/2020 prior to 70lbs weight loss. -Continue zetia '10mg'$  daily -Will have him repeat lipid panel with Oncologist to minimize lab draws   #Adenocarcinoma of the distal esophagus with liver and node mets: #Weight Loss  #Dysphagia Patient with progressive dysphagia and weight loss found to have adenocarcinoma at the GE junction. Had 70lbs unintentional weight loss. Followed by Dr. FBurr Medico -He received palliative radiation therapy to primary tumor 9/15-9/30/22 -On taxol and cyramza -TTE with GLS normal; will continue monitoring with taxol    Medication Adjustments/Labs and Tests Ordered: Current medicines are reviewed at length with the patient today.  Concerns regarding medicines are outlined above.  Orders Placed This Encounter  Procedures   EKG 12-Lead   ECHOCARDIOGRAM LIMITED    No orders of the defined types were placed in this encounter.   Patient Instructions  Medication Instructions:   Your physician recommends that you continue on your current medications as directed. Please refer to the Current Medication list given to you today.  *If you need a refill on your cardiac medications before your next appointment, please call your pharmacy*   Lab Work:  [facility]COLOGIST CHECK LIPIDS ON YOU NEXT TIME [facility]--DR. Laportia Carley WANTS TO GET AN UPDATE LIPID PANEL ON YOU.  SHE WILL MAKE NOTATION OF THIS IN HER NOTE FROM TODAY--JUST LET [place] WHEN THEY DRAW THIS SO WE CAN BE ON THE LOOKOUT FOR YOUR RESULTS.  If you have labs (blood work) drawn today and your tests are completely normal, you will receive your results only by: [place](if you  have MyChart) OR A paper copy in the mail If you have any lab test that is abnormal or  we need to change your treatment, we will call you to review the results.   Testing/Procedures:  Your physician has requested that you have an LIMITED echocardiogram. Echocardiography is a painless test that uses sound waves to create images of your heart. It provides your doctor with information about the size and shape of your heart and how well your heart's chambers and valves are working. This procedure takes approximately one hour. There are no restrictions for this procedure.  DO ECHO WITH STRAIN    Follow-Up: At [facility], you and your health needs are our priority.  As part of our continuing mission to provide you with exceptional heart care, we have created designated Provider Care Teams.  These Care Teams include your primary Cardiologist (physician) and Advanced Practice Providers (APPs -  Physician Assistants and Nurse Practitioners) who all work together to provide you with the care you need, when you need it.  We recommend signing up for the patient portal called "MyChart".  Sign up information is provided on this After Visit Summary.  MyChart is used to connect with patients for Virtual Visits (Telemedicine).  Patients are able to view lab/test results, encounter notes, upcoming appointments, etc.  Non-urgent messages can be sent to your provider as well.   To learn more about what you can do with MyChart, go to [email or web address].    Your next appointment:   6 month(s)  The format for your next appointment:   In Person  Provider:   DR. Johney Frame   Important Information About Sugar          Signed, Freada Bergeron, MD  09/05/2021 11:01 AM    [place]

## 2021-09-03 MED FILL — Dexamethasone Sodium Phosphate Inj 100 MG/10ML: INTRAMUSCULAR | Qty: 1 | Status: AC

## 2021-09-04 ENCOUNTER — Inpatient Hospital Stay: Payer: Medicare Other

## 2021-09-04 ENCOUNTER — Other Ambulatory Visit: Payer: Self-pay

## 2021-09-04 ENCOUNTER — Inpatient Hospital Stay: Payer: Medicare Other | Attending: Nurse Practitioner

## 2021-09-04 ENCOUNTER — Other Ambulatory Visit: Payer: Self-pay | Admitting: Physician Assistant

## 2021-09-04 ENCOUNTER — Inpatient Hospital Stay (HOSPITAL_BASED_OUTPATIENT_CLINIC_OR_DEPARTMENT_OTHER): Payer: Medicare Other | Admitting: Physician Assistant

## 2021-09-04 VITALS — BP 124/62 | HR 77 | Temp 97.3°F | Resp 17 | Wt 185.1 lb

## 2021-09-04 DIAGNOSIS — Z79899 Other long term (current) drug therapy: Secondary | ICD-10-CM | POA: Diagnosis not present

## 2021-09-04 DIAGNOSIS — K7460 Unspecified cirrhosis of liver: Secondary | ICD-10-CM | POA: Diagnosis not present

## 2021-09-04 DIAGNOSIS — Z95828 Presence of other vascular implants and grafts: Secondary | ICD-10-CM

## 2021-09-04 DIAGNOSIS — E0590 Thyrotoxicosis, unspecified without thyrotoxic crisis or storm: Secondary | ICD-10-CM | POA: Diagnosis not present

## 2021-09-04 DIAGNOSIS — C155 Malignant neoplasm of lower third of esophagus: Secondary | ICD-10-CM

## 2021-09-04 DIAGNOSIS — D709 Neutropenia, unspecified: Secondary | ICD-10-CM | POA: Diagnosis not present

## 2021-09-04 DIAGNOSIS — Z5111 Encounter for antineoplastic chemotherapy: Secondary | ICD-10-CM | POA: Insufficient documentation

## 2021-09-04 DIAGNOSIS — E876 Hypokalemia: Secondary | ICD-10-CM

## 2021-09-04 DIAGNOSIS — Z923 Personal history of irradiation: Secondary | ICD-10-CM | POA: Diagnosis not present

## 2021-09-04 DIAGNOSIS — L739 Follicular disorder, unspecified: Secondary | ICD-10-CM

## 2021-09-04 DIAGNOSIS — C787 Secondary malignant neoplasm of liver and intrahepatic bile duct: Secondary | ICD-10-CM | POA: Diagnosis not present

## 2021-09-04 LAB — CBC WITH DIFFERENTIAL/PLATELET
Abs Immature Granulocytes: 0.01 10*3/uL (ref 0.00–0.07)
Basophils Absolute: 0 10*3/uL (ref 0.0–0.1)
Basophils Relative: 2 %
Eosinophils Absolute: 0.1 10*3/uL (ref 0.0–0.5)
Eosinophils Relative: 4 %
HCT: 31.1 % — ABNORMAL LOW (ref 39.0–52.0)
Hemoglobin: 10.5 g/dL — ABNORMAL LOW (ref 13.0–17.0)
Immature Granulocytes: 1 %
Lymphocytes Relative: 35 %
Lymphs Abs: 0.7 10*3/uL (ref 0.7–4.0)
MCH: 33.8 pg (ref 26.0–34.0)
MCHC: 33.8 g/dL (ref 30.0–36.0)
MCV: 100 fL (ref 80.0–100.0)
Monocytes Absolute: 0.4 10*3/uL (ref 0.1–1.0)
Monocytes Relative: 22 %
Neutro Abs: 0.7 10*3/uL — ABNORMAL LOW (ref 1.7–7.7)
Neutrophils Relative %: 36 %
Platelets: 155 10*3/uL (ref 150–400)
RBC: 3.11 MIL/uL — ABNORMAL LOW (ref 4.22–5.81)
RDW: 15.8 % — ABNORMAL HIGH (ref 11.5–15.5)
WBC: 2 10*3/uL — ABNORMAL LOW (ref 4.0–10.5)
nRBC: 0 % (ref 0.0–0.2)

## 2021-09-04 LAB — COMPREHENSIVE METABOLIC PANEL
ALT: 14 U/L (ref 0–44)
AST: 24 U/L (ref 15–41)
Albumin: 3.6 g/dL (ref 3.5–5.0)
Alkaline Phosphatase: 70 U/L (ref 38–126)
Anion gap: 6 (ref 5–15)
BUN: 16 mg/dL (ref 8–23)
CO2: 20 mmol/L — ABNORMAL LOW (ref 22–32)
Calcium: 8.8 mg/dL — ABNORMAL LOW (ref 8.9–10.3)
Chloride: 110 mmol/L (ref 98–111)
Creatinine, Ser: 0.99 mg/dL (ref 0.61–1.24)
GFR, Estimated: >60 mL/min (ref >60)
Glucose, Bld: 174 mg/dL — ABNORMAL HIGH (ref 70–99)
Potassium: 4 mmol/L (ref 3.5–5.1)
Sodium: 136 mmol/L (ref 135–145)
Total Bilirubin: 0.5 mg/dL (ref 0.3–1.2)
Total Protein: 6.3 g/dL — ABNORMAL LOW (ref 6.5–8.1)

## 2021-09-04 LAB — IRON AND IRON BINDING CAPACITY (CC-WL,HP ONLY)
Iron: 42 ug/dL — ABNORMAL LOW (ref 45–182)
Saturation Ratios: 11 % — ABNORMAL LOW (ref 17.9–39.5)
TIBC: 378 ug/dL (ref 250–450)
UIBC: 336 ug/dL (ref 117–376)

## 2021-09-04 LAB — FERRITIN: Ferritin: 21 ng/mL — ABNORMAL LOW (ref 24–336)

## 2021-09-04 LAB — TOTAL PROTEIN, URINE DIPSTICK: Protein, ur: NEGATIVE mg/dL

## 2021-09-04 MED ORDER — SODIUM CHLORIDE 0.9% FLUSH
10.0000 mL | Freq: Once | INTRAVENOUS | Status: AC | PRN
  Administered 2021-09-04: 10 mL

## 2021-09-04 MED ORDER — DOXYCYCLINE HYCLATE 100 MG PO TABS: 100.0000 mg | ORAL_TABLET | Freq: Two times a day (BID) | ORAL | 0 refills | Status: DC

## 2021-09-04 MED ORDER — SODIUM CHLORIDE 0.9% FLUSH
10.0000 mL | Freq: Once | INTRAVENOUS | Status: AC
  Administered 2021-09-04: 10 mL

## 2021-09-04 MED ORDER — HEPARIN SOD (PORK) LOCK FLUSH 100 UNIT/ML IV SOLN
500.0000 [IU] | Freq: Once | INTRAVENOUS | Status: AC | PRN
  Administered 2021-09-04: 500 [IU]

## 2021-09-05 ENCOUNTER — Ambulatory Visit (INDEPENDENT_AMBULATORY_CARE_PROVIDER_SITE_OTHER): Payer: Medicare Other | Admitting: Cardiology

## 2021-09-05 ENCOUNTER — Encounter: Payer: Self-pay | Admitting: Cardiology

## 2021-09-05 VITALS — BP 116/60 | HR 83 | Ht 71.0 in | Wt 185.8 lb

## 2021-09-05 DIAGNOSIS — E7800 Pure hypercholesterolemia, unspecified: Secondary | ICD-10-CM

## 2021-09-05 DIAGNOSIS — I10 Essential (primary) hypertension: Secondary | ICD-10-CM

## 2021-09-05 DIAGNOSIS — Z9221 Personal history of antineoplastic chemotherapy: Secondary | ICD-10-CM

## 2021-09-05 DIAGNOSIS — C159 Malignant neoplasm of esophagus, unspecified: Secondary | ICD-10-CM

## 2021-09-05 DIAGNOSIS — K7031 Alcoholic cirrhosis of liver with ascites: Secondary | ICD-10-CM

## 2021-09-05 DIAGNOSIS — I3139 Other pericardial effusion (noninflammatory): Secondary | ICD-10-CM

## 2021-09-05 NOTE — Patient Instructions (Signed)
Medication Instructions:   Your physician recommends that you continue on your current medications as directed. Please refer to the Current Medication list given to you today.  *If you need a refill on your cardiac medications before your next appointment, please call your pharmacy*   Lab Work:  [place] ONCOLOGIST CHECK LIPIDS ON YOU NEXT TIME [place] [facility]--DR. PEMBERTON WANTS TO GET AN UPDATE LIPID PANEL ON YOU.  SHE WILL MAKE NOTATION OF THIS IN HER NOTE FROM TODAY--JUST LET us KNOW WHEN THEY DRAW THIS SO WE CAN BE ON THE LOOKOUT FOR YOUR RESULTS.  If you have labs (blood work) drawn today and your tests are completely normal, you will receive your results only by: [place] (if you have MyChart) OR A paper copy in the mail If you have any lab test that is abnormal or we need to change your treatment, we will call you to review the results.   Testing/Procedures:  Your physician has requested that you have an LIMITED echocardiogram. Echocardiography is a painless test that uses sound waves to create images of your heart. It provides your doctor with information about the size and shape of your heart and how well your heart's chambers and valves are working. This procedure takes approximately one hour. There are no restrictions for this procedure.  DO ECHO WITH STRAIN    Follow-Up: At [facility], you and your health needs are our priority.  As part of our continuing mission to provide you with exceptional heart care, we have created designated Provider Care Teams.  These Care Teams include your primary Cardiologist (physician) and Advanced Practice Providers (APPs -  Physician Assistants and Nurse Practitioners) who all work together to provide you with the care you need, when you need it.  We recommend signing up for the patient portal called "MyChart".  Sign up information is provided on this After Visit Summary.  MyChart is used to connect with patients for  Virtual Visits (Telemedicine).  Patients are able to view lab/test results, encounter notes, upcoming appointments, etc.  Non-urgent messages can be sent to your provider as well.   To learn more about what you can do with MyChart, go to [email or web address].    Your next appointment:   6 month(s)  The format for your next appointment:   In Person  Provider:   DR. Johney Frame   Important Information About Sugar

## 2021-09-08 ENCOUNTER — Other Ambulatory Visit: Payer: Self-pay

## 2021-09-08 MED ORDER — IRON SLOW RELEASE 142 (45 FE) MG PO TBCR: 1.0000 | EXTENDED_RELEASE_TABLET | Freq: Every day | ORAL | 3 refills | Status: DC

## 2021-09-09 NOTE — Progress Notes (Signed)
[facility] OFFICE PROGRESS NOTE  [name], [name], [place] [address]  DIAGNOSIS:  f/u of esophageal cancer  Oncology History Overview Note  Cancer Staging No matching staging information was found for the patient.    Primary adenocarcinoma of distal third of esophagus ([facility])  11/11/2020 Procedure   EGD by Dr. Watt Climes - Normal larynx. -? Malignant-appearing esophageal stenosis. Biopsied. - Pills were found in the esophagus. Removal was successful. - Rule out malignancy,? gastric tumor in the cardia difficult to say based on bleeding from passing the scope and unable to wash and suction for adequate visualization. - Normal ampulla, duodenal bulb, first portion of the duodenum, second portion of the duodenum, major papilla and area of the papilla. - The examination was otherwise normal.   11/11/2020 Initial Biopsy   FINAL MICROSCOPIC DIAGNOSIS:  A. ESOPHAGUS, DISTAL, BIOPSY:  -  Poorly differentiated adenocarcinoma  -  See comment    11/11/2020 Initial Diagnosis   Primary adenocarcinoma of distal third of esophagus ([facility])   11/11/2020 Cancer Staging   Staging form: Esophagus - Adenocarcinoma, AJCC 8th Edition - Clinical stage from 11/11/2020: Stage IVB (cTX, cN1, cM1) - Signed by [name], MD on 12/26/2020   11/14/2020 Imaging   CT CAPIMPRESSION: 1. Aggressive appearing new tumor of the gastroesophageal junction confluent with the irregular collection of right gastric lymph nodes, and with new associated metastatic retroperitoneal adenopathy. Small paraesophageal lymph nodes in the thorax are increased in size from prior exam and could also be involved, and there is a nonspecific 1.0 cm lesion inferiorly in the right hepatic lobe which could potentially be metastatic. 2. New subacute burst (AO type A4) fracture of L5, with mild posterior bony retropulsion and with a dominant coronally oriented fracture plane, and about 50% loss of  height. Correlate with interval trauma. 3. Slight thickening along the left paracolic gutter and trace pelvic ascites, although the ascites is markedly reduced compared to 05/04/2020. 4. Other imaging findings of potential clinical significance: Aortic Atherosclerosis (ICD10-I70.0). Coronary atherosclerosis. Old compression fracture at L2.   12/03/2020 PET scan   IMPRESSION: Tumor at the GE junction with local perigastric infiltration, hepatic and nodal metastases.   Nodal involvement both in the chest and abdomen extending to the lower retroperitoneum in the abdomen.   Burst fracture at L5 shows signs of increased metabolic activity, nonspecific in the setting of fracture, in light of other findings would correlate with any history of trauma, if no history of trauma consider MRI to exclude the possibility of pathologic fracture.   12/27/2020 Genetic Testing   Negative hereditary cancer genetic testing: no pathogenic variants detected in [facility] Multi-Cancer +RNA Panel.  The report date is December 27, 2020.   The Multi-Cancer + RNA Panel offered by [facility] includes sequencing and/or deletion/duplication analysis of the following 84 genes:  AIP*, ALK, APC*, ATM*, AXIN2*, BAP1*, BARD1*, BLM*, BMPR1A*, BRCA1*, BRCA2*, BRIP1*, CASR, CDC73*, CDH1*, CDK4, CDKN1B*, CDKN1C*, CDKN2A, CEBPA, CHEK2*, CTNNA1*, DICER1*, DIS3L2*, EGFR, EPCAM, FH*, FLCN*, GATA2*, GPC3, GREM1, HOXB13, HRAS, KIT, MAX*, MEN1*, MET, MITF, MLH1*, MSH2*, MSH3*, MSH6*, MUTYH*, NBN*, NF1*, NF2*, NTHL1*, PALB2*, PDGFRA, PHOX2B, PMS2*, POLD1*, POLE*, POT1*, PRKAR1A*, PTCH1*, PTEN*, RAD50*, RAD51C*, RAD51D*, RB1*, RECQL4, RET, RUNX1*, SDHA*, SDHAF2*, SDHB*, SDHC*, SDHD*, SMAD4*, SMARCA4*, SMARCB1*, SMARCE1*, STK11*, SUFU*, TERC, TERT, TMEM127*, Tp53*, TSC1*, TSC2*, VHL*, WRN*, and WT1.  RNA analysis is performed for * genes.   01/07/2021 - 05/07/2021 Chemotherapy   Patient is on Treatment Plan : GASTROESOPHAGEAL Nivolumab q14d  x 8  cycles / Nivolumab q28d     03/26/2021 Imaging   CT CAP IMPRESSION: 1. Evidence of treatment response. There is decreased size of the irregular infiltrative mass centered at the gastroesophageal junction as well as decreased size of numerous metastatic retroperitoneal and upper abdominal lymph nodes. 2. 2.1 cm hepatic metastasis at the inferior right hepatic lobe segment 6 is stable to slightly increased in size since previous PET-CT. No new hepatic mass visualized. 3. A few new adjacent small pulmonary nodules in the posterior left lower lobe measuring up to 4 mm in size. Continued follow-up recommended. 4. Small right pleural effusion which is new since previous study. Small pericardial effusion increased since previous study. 5. Other ancillary findings as described.     06/30/2021 Imaging   EXAM: CT CHEST, ABDOMEN, AND PELVIS WITH CONTRAST  IMPRESSION: 1. Worsening nodal disease in the chest and abdomen more so within the abdomen as described. 2. Increasing size of RIGHT pleural fluid and pericardial effusion. Now moderate. 3. Increasing size of RIGHT hepatic lesion. 4. New LEFT adrenal metastasis. 5. Increasing size of retroperitoneal adenopathy. 6. Obstruction of the RIGHT ureter may be related to a metastatic lesion to the RIGHT ureter, blood clot or primary urothelial lesion and is associated with mild hydronephrosis, moderate ureteral dilation and potential filling defect also in lower pole calyces of the RIGHT kidney. 7. Trace perihepatic ascites. 8. Aortic atherosclerosis.   07/17/2021 -  Chemotherapy   Patient is on Treatment Plan : GASTROESOPHAGEAL Ramucirumab D1, 15  / PACLitaxel D1,8,15 q28d       CURRENT THERAPY: Taxol and ramucirumab, days 1, 8, 15 q21d, starting 07/17/21             -ramucirumab held for cycle 1 D1 and D8. Treatment changed to every other week due to intolerance. Taxol dose reduced due 60 mg/m2 to neutropenia  INTERVAL HISTORY: Randy Wood 81  y.o. male returns to the clinic today for a follow-up visit. His treatment with taxol and cyramza was changed to being every 2 weeks due to intolerance. He has tolerated this much better.  He was last seen in the clinic last week on 09/04/2021.  At that point time, the patient had neutropenia and his treatment was delayed by 1 week.  When the patient was seen last week, he had new small scabs across his scalp that were pruritic and he was reporting tender/sore hair follicles.  He was given a prescription of doxycycline for skin infection/folliculitis in the setting of neutropenia. He also has been using head and shoulders. He reports the tenderness has improved as well as the pruritis.   He also had iron deficiency on labs last week. He picked up supplemental iron and tolerated this well without any GI side effects. He also saw his cardiologist provider last week who requested to have his lipids checked at the clinic today to avoid multiple lab sticks.   Otherwise since last being seen he denies any new concerning complaints today.  Denies any fevers, chills, or night sweats. He has good appetite but he lost 5 lbs since last being seen. He thinks this is because he did not eat well one day this past week. He is aware nutrition may check on him while in the infusion room in the future since he lost 5 lbs in a week.  He denies any abdominal pain or bloating.  He actually feels less bloated today than prior and feels like his stomach is "softer". He  denies any dysphagia or odynophagia.  Denies any dysuria.  Denies any nausea, vomiting, diarrhea, or constipation.  Denies any chest pain.  He reports his baseline cough secondary to nasal congestion.  He denies any significant shortness of breath.  He is here today for evaluation and repeat blood work before starting day 1 cycle 3   MEDICAL HISTORY: Past Medical History:  Diagnosis Date   AKI (acute kidney injury) ([facility])    Alcohol abuse    Arthritis     Cataract 2006   Diarrhea    Edema leg    Esophageal cancer ([facility])    Family history of breast cancer 12/09/2020   Family history of ovarian cancer 12/09/2020   Family history of prostate cancer 12/09/2020   GERD (gastroesophageal reflux disease)    Glaucoma unknown   Hyperbilirubinemia    Hyperlipemia    Hypertension    Hypokalemia    Liver failure ([facility])    Macular degeneration, wet ([facility])    Microcytic anemia    Osteoarthritis    SOB (shortness of breath)    Weight gain     ALLERGIES:  is allergic to lisinopril.  MEDICATIONS:  Current Outpatient Medications  Medication Sig Dispense Refill   Azelastine HCl 137 MCG/SPRAY SOLN INSTILL 2 SPRAYS INTO EACH NOSTRIL TWICE A DAY AS DIRECTED 30 mL 1   cetirizine (ZYRTEC) 10 MG tablet Take 10 mg by mouth daily.     dorzolamide-timolol (COSOPT) 22.3-6.8 MG/ML ophthalmic solution Place 1 drop into the left eye 2 (two) times daily.     doxycycline (VIBRA-TABS) 100 MG tablet Take 1 tablet (100 mg total) by mouth 2 (two) times daily. 20 tablet 0   EPINEPHrine 0.3 mg/0.3 mL IJ SOAJ injection Inject 0.3 mg into the muscle as needed for anaphylaxis.     ezetimibe (ZETIA) 10 MG tablet Take 1 tablet (10 mg total) by mouth daily. 90 tablet 3   Ferrous Sulfate (IRON SLOW RELEASE) 142 (45 Fe) MG TBCR Take 1 tablet by mouth daily. 30 tablet 3   GLUCOSAMINE-CHONDROITIN PO Take 1 capsule by mouth daily.     lactulose (CHRONULAC) 10 GM/15ML solution Take 15 mLs (10 g total) by mouth daily as needed for mild constipation. 236 mL 0   latanoprost (XALATAN) 0.005 % ophthalmic solution Place 1 drop into the left eye at bedtime.     levothyroxine (SYNTHROID) 50 MCG tablet Take 1 tablet (50 mcg total) by mouth daily. 30 tablet 6   lidocaine-prilocaine (EMLA) cream Apply 1 application. topically as needed. 30 g 0   moxifloxacin (VIGAMOX) 0.5 % ophthalmic solution      Multiple Vitamins-Minerals (ONE-A-DAY MENS 50+) TABS Take 1 tablet by mouth daily.     Multiple  Vitamins-Minerals (PRESERVISION AREDS 2 PO) Take 1 tablet by mouth 2 (two) times daily.     ondansetron (ZOFRAN) 8 MG tablet Take 1 tablet (8 mg total) by mouth every 8 (eight) hours as needed for nausea or vomiting. 30 tablet 2   prochlorperazine (COMPAZINE) 10 MG tablet Take 1 tablet (10 mg total) by mouth every 6 (six) hours as needed. 30 tablet 2   spironolactone (ALDACTONE) 50 MG tablet TAKE ONE TABLET BY MOUTH DAILY 90 tablet 0   sucralfate (CARAFATE) 1 g tablet Take 1 tablet (1 g total) by mouth 4 (four) times daily. Dissolve each tablet in 15 cc water before use. 120 tablet 2   No current facility-administered medications for this visit.    SURGICAL HISTORY:  Past Surgical  History:  Procedure Laterality Date   ANKLE SURGERY     BIOPSY  11/11/2020   Procedure: BIOPSY;  Surgeon: [name], MD;  Location: [facility];  Service: Gastroenterology;;   CATARACT EXTRACTION, BILATERAL     ESOPHAGOGASTRODUODENOSCOPY (EGD) WITH PROPOFOL N/A 11/11/2020   Procedure: ESOPHAGOGASTRODUODENOSCOPY (EGD) WITH PROPOFOL;  Surgeon: [name], MD;  Location: [facility];  Service: Gastroenterology;  Laterality: N/A;   EYE SURGERY  2006   FOREIGN BODY REMOVAL  11/11/2020   Procedure: FOREIGN BODY REMOVAL;  Surgeon: [name], MD;  Location: [facility];  Service: Gastroenterology;;   IR IMAGING GUIDED PORT INSERTION  07/16/2021   IR PARACENTESIS  05/09/2020   TONSILLECTOMY      REVIEW OF SYSTEMS:   Review of Systems  Constitutional: Positive for weight loss. Negative for appetite change, chills, fatigue, and fever.  HENT:   Negative for mouth sores, nosebleeds, sore throat and trouble swallowing.   Eyes: Negative for eye problems and icterus.  Respiratory: Positive for baseline "deep" cough.  Negative for hemoptysis, shortness of breath and wheezing.   Cardiovascular: Negative for chest pain and leg swelling.  Gastrointestinal: Negative for abdominal pain, constipation, diarrhea, nausea and  vomiting.  Genitourinary: Negative for bladder incontinence, difficulty urinating, dysuria, frequency and hematuria.   Musculoskeletal: Negative for back pain, gait problem, neck pain and neck stiffness.  Skin: Positive for scabs on scalp (improving).  Neurological: Negative for dizziness, extremity weakness, gait problem, headaches, light-headedness and seizures.  Hematological: Negative for adenopathy. Does not bruise/bleed easily.  Psychiatric/Behavioral: Negative for confusion, depression and sleep disturbance. The patient is not nervous/anxious.     PHYSICAL EXAMINATION:  Blood pressure 114/74, pulse 77, temperature 98.3 F (36.8 C), temperature source Tympanic, resp. rate 17, height _0  (1.803 m), weight 180 lb 1.6 oz (81.7 kg), SpO2 100 %.  ECOG PERFORMANCE STATUS: 1  Physical Exam  Constitutional: Oriented to person, place, and time and well-developed, well-nourished, and in no distress.  HENT:  Head: Normocephalic and atraumatic.  Mouth/Throat: Oropharynx is clear and moist. No oropharyngeal exudate.  Eyes: Conjunctivae are normal. Right eye exhibits no discharge. Left eye exhibits no discharge. No scleral icterus.  Neck: Normal range of motion. Neck supple.  Cardiovascular: Normal rate, regular rhythm, normal heart sounds and intact distal pulses.   Pulmonary/Chest: Effort normal and breath sounds normal. No respiratory distress. No wheezes. No rales.  Abdominal: Soft. Bowel sounds are normal. Exhibits no distension and no mass. There is no tenderness.  Musculoskeletal: Normal range of motion. Exhibits no edema.  Lymphadenopathy:    No cervical adenopathy.  Neurological: Alert and oriented to person, place, and time. Exhibits normal muscle tone. Gait normal. Coordination normal.  Skin: Persistent scalp scabs with less pruritis/tenderness per patient report compared to last week. Skin is warm and dry. No Not diaphoretic. No pallor.  Psychiatric: Mood, memory and judgment  normal.  Vitals reviewed.    LABORATORY DATA: Lab Results  Component Value Date   WBC 5.8 09/11/2021   HGB 11.0 (L) 09/11/2021   HCT 32.3 (L) 09/11/2021   MCV 99.7 09/11/2021   PLT 165 09/11/2021      Chemistry      Component Value Date/Time   NA 137 09/11/2021 0746   K 4.0 09/11/2021 0746   CL 109 09/11/2021 0746   CO2 20 (L) 09/11/2021 0746   BUN 16 09/11/2021 0746   CREATININE 1.10 09/11/2021 0746   CREATININE 1.11 06/30/2021 0923   CREATININE 0.99 09/17/2020 1050  Component Value Date/Time   CALCIUM 8.9 09/11/2021 0746   ALKPHOS 69 09/11/2021 0746   AST 22 09/11/2021 0746   AST 23 06/30/2021 0923   ALT 12 09/11/2021 0746   ALT 9 06/30/2021 0923   BILITOT 0.5 09/11/2021 0746   BILITOT 0.8 06/30/2021 0923       RADIOGRAPHIC STUDIES:  No results found.   ASSESSMENT/PLAN:  KAMSIYOCHUKWU SPICKLER is a 81 y.o. male with    1. 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  -PET 12/03/20 showed: esophageal tumor with local perigastric infiltration; hepatic and nodal metastasis involving the chest and abdomen extending to lower retroperitoneum; burst fracture at L5 with increased metabolic activity -Molecular testing showed MMR normal, PD-L1 CPS 3% -He received palliative radiation to the primary tumor 9/15 - 12/27/20 -He began single agent Xeloda 1 week on/1 week off and nivolumab every 2 weeks on 01/07/21, tolerating well -his thyroid function has became abnormal on nivolumab and was referred to Dr. Kelton Pillar.  -restaging CT on 06/30/21 showed worsening disease within nodes in chest and abdomen, liver, adrenal gland, and retroperitoneal nodes.  -we switched to taxol on  07/17/21, port placed the day before. He experienced abdominal cramps and diarrhea for 4-5 days after day 8. Treatment changed to every other week due to intolerance.  -Cycle #3 delayed due to neutropenia -He is here for day 1 cycle #3 today. Labs were reviewed.  I discussed the patient's labs from last week with Dr. Burr Medico today.  Moving forward, Dr. Burr Medico is going to reduce the dose of his Taxol to 60 mg per metered squared due to neutropenia.  He will proceed with his third dose of treatment today as scheduled. -We will see him back for follow-up visit and repeat lab work in 2 weeks.    2. Folliculitis  -Patient reports itchy scalp with small tender sores.  This started around 09/01/2021 -Given that he is neutropenic on the day of that visit, I send a prescription for doxycycline 100 milligrams p.o. for 10 days.  The patient is well versed in the adverse side effects of doxycycline as he was previously taking this for years for rosacea.  He knows to stay out of the sun while he is undergoing treatment with this. -I also encouraged him to use either S[place] Blue or head and shoulders for the itchy scalp. -Today (09/11/21) he still has some scabs but he states the soreness and pruritis has improved greatly since last being seen.    3.  Side effects (anemia, muscle cramps, electrolyte derangements, etc. -Patient's iron studies show low ferritin and low iron and saturation on 09/04/21. Instructed to start iron supplement. He started this and has been tolerating it well without any GI side effects. He reports he actually feels better.  -The patient mentions that he had muscle cramps two visits ago.  He did have some worsening hypocalcemia at this time.  Encouraged to increase his intake of calcium rich foods and increase his hydration which will likely help with muscle cramps.  They have improved compared to his last appointment.Was not mentioned at today's visit.    4. Hyperthyroidism  -secondary to  nivolumab -initially noted on 05/07/21 labs-- T4 17.5 and TSH <0.08. He is asymptomatic. -he established  care with Dr. Kelton Pillar on 06/11/21. Repeat labs on 07/15/21 showed TSH 70.63 and free T4 0.31. He was started on synthroid on 07/16/21.   5. Cirrhosis, abdominal pain -felt to be alcohol-related. He has abstained from alcohol since 2020/05/15 -S/p paracentesis 05/09/20 yielding 4.4 liters, path showed reactive mesothelial cells -Compensated, hepatic function panel 09/17/20 WNL -Continue spironolactone.  Hold Lasix -followed by GI   6. Social support -He is widowed and lives alone, his wife died from Willis in 16-May-2015, she was treated here -2 daughters live out of town, [name] lives in [place] and is the closest, also his primary contact for healthcare -He has church community and some family friends who could help if needed -He has met outpatient palliative care -Patient is independent with ADLs but has a Secretary/administrator and yard service.    73. Family history  -Patient's mother had ovarian cancer, father had prostate cancer, and a daughter has breast cancer at age 3 -His daughter [name] without cancer underwent genetic testing and is negative -He was referred to genetics and underwent testing 12/09/20, results were negative   7. Glaucoma, hyperlipidemia, comorbitities.  -The patient reports that Flonase was discontinued by his glaucoma specialist Dr. Ander Slade at the [facility] eye clinic in [place].  This was due to it contributing to increase in intraocular pressure. -his intraocular pressure is being checked by Dr. Ander Slade, next on 09/08/21. -Patient saw his cardiologist last week on 09/05/21, they are requesting lipid panel at the cancer center to avoid lab draws. Will order today and route results to cardiologist.      PLAN: -lab, flush, and taxol and ramucizumab in 2,4, and 6 weeks. -Dr. Burr Medico to reduce dose of taxol due to neutropenia last week -He lost weight but reports good appetite. Will continue to  monitor -Lipid panel ordered and will route to cardiologist per request       Orders Placed This Encounter  Procedures   Lipid panel    Standing Status:   Future    Standing Expiration Date:   09/11/2022    Order Specific Question:   CC Results    Answer:   Freada Bergeron [[id number]]     The total time spent in the appointment was 20-29 minutes  [place], PA-C 09/11/21

## 2021-09-10 ENCOUNTER — Other Ambulatory Visit: Payer: Medicare Other

## 2021-09-10 ENCOUNTER — Ambulatory Visit: Payer: Medicare Other | Admitting: Physician Assistant

## 2021-09-10 ENCOUNTER — Ambulatory Visit: Payer: Medicare Other

## 2021-09-10 ENCOUNTER — Other Ambulatory Visit: Payer: Self-pay

## 2021-09-10 DIAGNOSIS — D539 Nutritional anemia, unspecified: Secondary | ICD-10-CM

## 2021-09-10 MED ORDER — IRON SLOW RELEASE 142 (45 FE) MG PO TBCR: 1.0000 | EXTENDED_RELEASE_TABLET | Freq: Every day | ORAL | 3 refills | Status: DC

## 2021-09-11 ENCOUNTER — Inpatient Hospital Stay (HOSPITAL_BASED_OUTPATIENT_CLINIC_OR_DEPARTMENT_OTHER): Payer: Medicare Other | Admitting: Physician Assistant

## 2021-09-11 ENCOUNTER — Inpatient Hospital Stay: Payer: Medicare Other

## 2021-09-11 ENCOUNTER — Encounter: Payer: Self-pay | Admitting: Physician Assistant

## 2021-09-11 ENCOUNTER — Other Ambulatory Visit: Payer: Self-pay

## 2021-09-11 VITALS — BP 114/74 | HR 77 | Temp 98.3°F | Resp 17 | Ht 71.0 in | Wt 180.1 lb

## 2021-09-11 DIAGNOSIS — K7460 Unspecified cirrhosis of liver: Secondary | ICD-10-CM | POA: Diagnosis not present

## 2021-09-11 DIAGNOSIS — C155 Malignant neoplasm of lower third of esophagus: Secondary | ICD-10-CM | POA: Diagnosis not present

## 2021-09-11 DIAGNOSIS — E785 Hyperlipidemia, unspecified: Secondary | ICD-10-CM | POA: Diagnosis not present

## 2021-09-11 DIAGNOSIS — Z5111 Encounter for antineoplastic chemotherapy: Secondary | ICD-10-CM | POA: Diagnosis not present

## 2021-09-11 DIAGNOSIS — C787 Secondary malignant neoplasm of liver and intrahepatic bile duct: Secondary | ICD-10-CM | POA: Diagnosis not present

## 2021-09-11 DIAGNOSIS — E876 Hypokalemia: Secondary | ICD-10-CM

## 2021-09-11 DIAGNOSIS — E0590 Thyrotoxicosis, unspecified without thyrotoxic crisis or storm: Secondary | ICD-10-CM | POA: Diagnosis not present

## 2021-09-11 DIAGNOSIS — Z923 Personal history of irradiation: Secondary | ICD-10-CM | POA: Diagnosis not present

## 2021-09-11 DIAGNOSIS — Z95828 Presence of other vascular implants and grafts: Secondary | ICD-10-CM

## 2021-09-11 LAB — COMPREHENSIVE METABOLIC PANEL
ALT: 12 U/L (ref 0–44)
AST: 22 U/L (ref 15–41)
Albumin: 3.6 g/dL (ref 3.5–5.0)
Alkaline Phosphatase: 69 U/L (ref 38–126)
Anion gap: 8 (ref 5–15)
BUN: 16 mg/dL (ref 8–23)
CO2: 20 mmol/L — ABNORMAL LOW (ref 22–32)
Calcium: 8.9 mg/dL (ref 8.9–10.3)
Chloride: 109 mmol/L (ref 98–111)
Creatinine, Ser: 1.1 mg/dL (ref 0.61–1.24)
GFR, Estimated: >60 mL/min (ref >60)
Glucose, Bld: 135 mg/dL — ABNORMAL HIGH (ref 70–99)
Potassium: 4 mmol/L (ref 3.5–5.1)
Sodium: 137 mmol/L (ref 135–145)
Total Bilirubin: 0.5 mg/dL (ref 0.3–1.2)
Total Protein: 6.5 g/dL (ref 6.5–8.1)

## 2021-09-11 LAB — CBC WITH DIFFERENTIAL/PLATELET
Abs Immature Granulocytes: 0.02 10*3/uL (ref 0.00–0.07)
Basophils Absolute: 0.1 10*3/uL (ref 0.0–0.1)
Basophils Relative: 1 %
Eosinophils Absolute: 0.1 10*3/uL (ref 0.0–0.5)
Eosinophils Relative: 2 %
HCT: 32.3 % — ABNORMAL LOW (ref 39.0–52.0)
Hemoglobin: 11 g/dL — ABNORMAL LOW (ref 13.0–17.0)
Immature Granulocytes: 0 %
Lymphocytes Relative: 19 %
Lymphs Abs: 1.1 10*3/uL (ref 0.7–4.0)
MCH: 34 pg (ref 26.0–34.0)
MCHC: 34.1 g/dL (ref 30.0–36.0)
MCV: 99.7 fL (ref 80.0–100.0)
Monocytes Absolute: 0.8 10*3/uL (ref 0.1–1.0)
Monocytes Relative: 13 %
Neutro Abs: 3.8 10*3/uL (ref 1.7–7.7)
Neutrophils Relative %: 65 %
Platelets: 165 10*3/uL (ref 150–400)
RBC: 3.24 MIL/uL — ABNORMAL LOW (ref 4.22–5.81)
RDW: 15.9 % — ABNORMAL HIGH (ref 11.5–15.5)
WBC: 5.8 10*3/uL (ref 4.0–10.5)
nRBC: 0 % (ref 0.0–0.2)

## 2021-09-11 LAB — IRON AND IRON BINDING CAPACITY (CC-WL,HP ONLY)
Iron: 52 ug/dL (ref 45–182)
Saturation Ratios: 14 % — ABNORMAL LOW (ref 17.9–39.5)
TIBC: 361 ug/dL (ref 250–450)
UIBC: 309 ug/dL (ref 117–376)

## 2021-09-11 LAB — TOTAL PROTEIN, URINE DIPSTICK: Protein, ur: <30 mg/dL — AB

## 2021-09-11 LAB — FERRITIN: Ferritin: 24 ng/mL (ref 24–336)

## 2021-09-11 MED ORDER — SODIUM CHLORIDE 0.9% FLUSH
10.0000 mL | INTRAVENOUS | Status: DC | PRN
  Administered 2021-09-11: 10 mL

## 2021-09-11 MED ORDER — SODIUM CHLORIDE 0.9 % IV SOLN
10.0000 mg | Freq: Once | INTRAVENOUS | Status: AC
  Administered 2021-09-11: 10 mg via INTRAVENOUS
  Filled 2021-09-11: qty 1
  Filled 2021-09-11: qty 10

## 2021-09-11 MED ORDER — DIPHENHYDRAMINE HCL 50 MG/ML IJ SOLN
25.0000 mg | Freq: Once | INTRAMUSCULAR | Status: AC
  Administered 2021-09-11: 25 mg via INTRAVENOUS
  Filled 2021-09-11: qty 1

## 2021-09-11 MED ORDER — HEPARIN SOD (PORK) LOCK FLUSH 100 UNIT/ML IV SOLN
500.0000 [IU] | Freq: Once | INTRAVENOUS | Status: AC | PRN
  Administered 2021-09-11: 500 [IU]

## 2021-09-11 MED ORDER — SODIUM CHLORIDE 0.9 % IV SOLN
8.0000 mg/kg | Freq: Once | INTRAVENOUS | Status: AC
  Administered 2021-09-11: 600 mg via INTRAVENOUS
  Filled 2021-09-11: qty 50

## 2021-09-11 MED ORDER — SODIUM CHLORIDE 0.9 % IV SOLN
60.0000 mg/m2 | Freq: Once | INTRAVENOUS | Status: AC
  Administered 2021-09-11: 120 mg via INTRAVENOUS
  Filled 2021-09-11: qty 20

## 2021-09-11 MED ORDER — SODIUM CHLORIDE 0.9 % IV SOLN: Freq: Once | INTRAVENOUS | Status: AC

## 2021-09-11 MED ORDER — SODIUM CHLORIDE 0.9% FLUSH
10.0000 mL | Freq: Once | INTRAVENOUS | Status: AC
  Administered 2021-09-11: 10 mL

## 2021-09-11 MED ORDER — FAMOTIDINE IN NACL 20-0.9 MG/50ML-% IV SOLN
20.0000 mg | Freq: Once | INTRAVENOUS | Status: AC
  Administered 2021-09-11: 20 mg via INTRAVENOUS
  Filled 2021-09-11: qty 50

## 2021-09-11 NOTE — Patient Instructions (Signed)
[facility] ONCOL[facility]   Discharge Instructions: Thank you for choosing [facility] to provide your oncology and hematology care.   If you have a lab appointment with the [facility], please go directly to the [facility] and check in at the registration area.   Wear comfortable clothing and clothing appropriate for easy access to any Portacath or PICC line.   We strive to give you quality time with your provider. You may need to reschedule your appointment if you arrive late (15 or more minutes).  Arriving late affects you and other patients whose appointments are after yours.  Also, if you miss three or more appointments without notifying the office, you may be dismissed from the clinic at the provider's discretion.      For prescription refill requests, have your pharmacy contact our office and allow 72 hours for refills to be completed.    Today you received the following chemotherapy and/or immunotherapy agents: ramucirumab and paclitaxel      To help prevent nausea and vomiting after your treatment, we encourage you to take your nausea medication as directed.  BELOW ARE SYMPTOMS THAT SHOULD BE REPORTED IMMEDIATELY: *FEVER GREATER THAN 100.4 F (38 C) OR HIGHER *CHILLS OR SWEATING *NAUSEA AND VOMITING THAT IS NOT CONTROLLED WITH YOUR NAUSEA MEDICATION *UNUSUAL SHORTNESS OF BREATH *UNUSUAL BRUISING OR BLEEDING *URINARY PROBLEMS (pain or burning when urinating, or frequent urination) *BOWEL PROBLEMS (unusual diarrhea, constipation, pain near the anus) TENDERNESS IN MOUTH AND THROAT WITH OR WITHOUT PRESENCE OF ULCERS (sore throat, sores in mouth, or a toothache) UNUSUAL RASH, SWELLING OR PAIN  UNUSUAL VAGINAL DISCHARGE OR ITCHING   Items with * indicate a potential emergency and should be followed up as soon as possible or go to the Emergency Department if any problems should occur.  Please show the CHEMOTHERAPY ALERT CARD or IMMUNOTHERAPY ALERT  CARD at check-in to the Emergency Department and triage nurse.  Should you have questions after your visit or need to cancel or reschedule your appointment, please contact [facility]  Dept: [phone]  and follow the prompts.  Office hours are 8:00 a.m. to 4:30 p.m. Monday - Friday. Please note that voicemails left after 4:00 p.m. may not be returned until the following business day.  We are closed weekends and major holidays. You have access to a nurse at all times for urgent questions. Please call the main number to the clinic Dept: [phone] and follow the prompts.   For any non-urgent questions, you may also contact your provider using MyChart. We now offer e-Visits for anyone 35 and older to request care online for non-urgent symptoms. For details visit [email or web address].   Also download the MyChart app! Go to the app store, search "MyChart", open the app, select [facility], and log in with your MyChart username and password.  Masks are optional in the cancer centers. If you would like for your care team to wear a mask while they are taking care of you, please let them know. For doctor visits, patients may have with them one support person who is at least 82 years old. At this time, visitors are not allowed in the infusion area.

## 2021-09-16 ENCOUNTER — Ambulatory Visit (INDEPENDENT_AMBULATORY_CARE_PROVIDER_SITE_OTHER): Payer: Medicare Other | Admitting: Internal Medicine

## 2021-09-16 ENCOUNTER — Encounter: Payer: Self-pay | Admitting: Internal Medicine

## 2021-09-16 VITALS — BP 140/80 | HR 82 | Ht 71.0 in | Wt 183.2 lb

## 2021-09-16 DIAGNOSIS — E032 Hypothyroidism due to medicaments and other exogenous substances: Secondary | ICD-10-CM | POA: Diagnosis not present

## 2021-09-16 LAB — T4, FREE: Free T4: 0.57 ng/dL — ABNORMAL LOW (ref 0.60–1.60)

## 2021-09-16 LAB — TSH: TSH: 52.56 u[IU]/mL — ABNORMAL HIGH (ref 0.35–5.50)

## 2021-09-16 NOTE — Patient Instructions (Addendum)
You will need thyroid check in 6-8 weeks     You are on levothyroxine - which is your thyroid hormone supplement. You MUST take this consistently.  You should take this first thing in the morning on an empty stomach with water. You should not take it with other medications. Wait 78mn to 1hr prior to eating. If you are taking any vitamins - please take these in the evening.   If you miss a dose, please take your missed dose the following day (double the dose for that day). You should have a pill box for ONLY levothyroxine on your bedside table to help you remember to take your medications.

## 2021-09-16 NOTE — Progress Notes (Unsigned)
Name: Randy Wood  MRN/ DOB: [id number], 10/05/1940    Age/ Sex: 81 y.o., male    PCP: [name], [name], MD   Reason for Endocrinology Evaluation: Hyperthyroidism     Date of Initial Endocrinology Evaluation: 06/11/2021    HPI: Randy Wood is a 81 y.o. male with a past medical history of HTN, GERD, IBS, cirrhosis and adenocarcinoma of the esophagus. The patient presented for initial endocrinology clinic visit on 06/11/2021 for consultative assistance with his Hyperthyroidism.   Patient has been noted with suppressed TSH on 05/07/2021 at < 0.080 uIU/mL and elevated total T4 at 17.5 UG/DL.  This was attributed to nivolumab which was held 06/05/2021  Of note, the patient follows with oncology for metastatic adenocarcinoma of the distal esophagus, which was diagnosed 10/2020.  He received palliative radiation to the primary tumor in 11/2020 He is on Xeloda(Capecitabine ) and nivolumab which was started 12/2020    He was on half a dose of nivolumab until 03/2021 when he went to a full dose and that is when his hyperthyroidism was noted.  Nivolumab was held with normalization of TFTs.  But by April 2023 his TSH was elevated at 70.63 u IU/mL and he was started on levothyroxine  Chemotherapeutic agents were changed to Taxol and Ramucizumab 06/2021  No prior hx of thyroid disease  Daughter with Hashimoto's disease  Maternal grand mother with thyroid disease      SUBJECTIVE:    Today (09/16/21):  Randy Wood is here for follow-up on thyroid disease.  Weight has been steady  Denies palpitations  Has chronic constipation Denies tremors  Denies local neck symptoms  Appetite is stable  He has been sleepy and taking naps  Has chemotherapy related neuropathy    Scheduled for routine echo ekg through cardiology    He continues to follow-up with oncology for esophageal adenocarcinoma.  Recent CT scan 06/2021 showed worsening disease .   Levothyroxine 50 mcg daily    HISTORY:   Past Medical History:  Past Medical History:  Diagnosis Date   AKI (acute kidney injury) ([place])    Alcohol abuse    Arthritis    Cataract 2006   Diarrhea    Edema leg    Esophageal cancer ([place])    Family history of breast cancer 12/09/2020   Family history of ovarian cancer 12/09/2020   Family history of prostate cancer 12/09/2020   GERD (gastroesophageal reflux disease)    Glaucoma unknown   Hyperbilirubinemia    Hyperlipemia    Hypertension    Hypokalemia    Liver failure ([facility])    Macular degeneration, wet ([facility])    Microcytic anemia    Osteoarthritis    SOB (shortness of breath)    Weight gain    Past Surgical History:  Past Surgical History:  Procedure Laterality Date   ANKLE SURGERY     BIOPSY  11/11/2020   Procedure: BIOPSY;  Surgeon: [name], MD;  Location: [facility];  Service: Gastroenterology;;   CATARACT EXTRACTION, BILATERAL     ESOPHAGOGASTRODUODENOSCOPY (EGD) WITH PROPOFOL N/A 11/11/2020   Procedure: ESOPHAGOGASTRODUODENOSCOPY (EGD) WITH PROPOFOL;  Surgeon: [name], MD;  Location: [facility];  Service: Gastroenterology;  Laterality: N/A;   EYE SURGERY  2006   FOREIGN BODY REMOVAL  11/11/2020   Procedure: FOREIGN BODY REMOVAL;  Surgeon: [name], MD;  Location: [facility];  Service: Gastroenterology;;   IR IMAGING GUIDED PORT INSERTION  07/16/2021   IR PARACENTESIS  05/09/2020  TONSILLECTOMY      Social History:  reports that he quit smoking about 49 years ago. His smoking use included cigarettes. He has a 30.00 pack-year smoking history. He has never used smokeless tobacco. He reports that he does not currently use alcohol. He reports that he does not use drugs. Family History: family history includes Breast cancer (age of onset: 45) in his daughter; Cancer in his daughter, father, and mother; Colon cancer in his paternal grandmother; Hashimoto's thyroiditis in his daughter; Healthy in his brother; Ovarian cancer (age of onset: 97) in his  mother; Prostate cancer in his father; Stomach cancer in his cousin.   HOME MEDICATIONS: Allergies as of 09/16/2021       Reactions   Lisinopril Cough        Medication List        Accurate as of September 16, 2021 12:56 PM. If you have any questions, ask your nurse or doctor.          Azelastine HCl 137 MCG/SPRAY Soln INSTILL 2 SPRAYS INTO EACH NOSTRIL TWICE A DAY AS DIRECTED   cetirizine 10 MG tablet Commonly known as: ZYRTEC Take 10 mg by mouth daily.   dorzolamide-timolol 22.3-6.8 MG/ML ophthalmic solution Commonly known as: COSOPT Place 1 drop into the left eye 2 (two) times daily.   doxycycline 100 MG tablet Commonly known as: VIBRA-TABS Take 1 tablet (100 mg total) by mouth 2 (two) times daily.   EPINEPHrine 0.3 mg/0.3 mL Soaj injection Commonly known as: EPI-PEN Inject 0.3 mg into the muscle as needed for anaphylaxis.   ezetimibe 10 MG tablet Commonly known as: ZETIA Take 1 tablet (10 mg total) by mouth daily.   GLUCOSAMINE-CHONDROITIN PO Take 1 capsule by mouth daily.   Iron Slow Release 142 (45 Fe) MG Tbcr Generic drug: Ferrous Sulfate Take 1 tablet by mouth daily.   lactulose 10 GM/15ML solution Commonly known as: CHRONULAC Take 15 mLs (10 g total) by mouth daily as needed for mild constipation.   latanoprost 0.005 % ophthalmic solution Commonly known as: XALATAN Place 1 drop into the left eye at bedtime.   levothyroxine 50 MCG tablet Commonly known as: SYNTHROID Take 1 tablet (50 mcg total) by mouth daily.   lidocaine-prilocaine cream Commonly known as: EMLA Apply 1 application. topically as needed.   moxifloxacin 0.5 % ophthalmic solution Commonly known as: VIGAMOX   ondansetron 8 MG tablet Commonly known as: ZOFRAN Take 1 tablet (8 mg total) by mouth every 8 (eight) hours as needed for nausea or vomiting.   One-A-Day Mens 50+ Tabs Take 1 tablet by mouth daily.   PRESERVISION AREDS 2 PO Take 1 tablet by mouth 2 (two) times daily.    prochlorperazine 10 MG tablet Commonly known as: COMPAZINE Take 1 tablet (10 mg total) by mouth every 6 (six) hours as needed.   spironolactone 50 MG tablet Commonly known as: ALDACTONE TAKE ONE TABLET BY MOUTH DAILY   sucralfate 1 g tablet Commonly known as: Carafate Take 1 tablet (1 g total) by mouth 4 (four) times daily. Dissolve each tablet in 15 cc water before use.          REVIEW OF SYSTEMS: A comprehensive ROS was conducted with the patient and is negative except as per HPI     OBJECTIVE:  VS: There were no vitals taken for this visit.   Wt Readings from Last 3 Encounters:  09/11/21 180 lb 1.6 oz (81.7 kg)  09/05/21 185 lb 12.8 oz (84.3 kg)  09/04/21 185 lb 1 oz (83.9 kg)     EXAM: General: Pt appears well and is in NAD  Eyes: External eye exam normal without stare, lid lag or exophthalmos.  EOM intact.  PERRL.  Neck: General: Supple without adenopathy. Thyroid: Thyroid size normal.  No goiter or nodules appreciated.  Lungs: Clear with good BS bilat with no rales, rhonchi, or wheezes  Heart: Auscultation: RRR.  Extremities:  BL LE: No pretibial edema normal ROM and strength.  Mental Status: Judgment, insight: Intact Orientation: Oriented to time, place, and person Mood and affect: No depression, anxiety, or agitation     DATA REVIEWED:    ASSESSMENT/PLAN/RECOMMENDATIONS:   Nivolumab induced Thyroid disease :  -Initially had hyperthyroidism followed by Hypothyroidism  - Has been on LT-4 replacement since 06/2021 - He takes it appropriately  - He will have TFT's checked in 6-8 weeks through oncology   Follow-up in 4 months  Signed electronically by: Mack Guise, MD  [facility] Endocrinology  [facility] [address] [place],  [address] Phone: [phone] FAX: [phone]   CC: [name], Craig Beach, MD [place] [address] Phone: [phone] Fax: [phone]   Return to  Endocrinology clinic as below: Future Appointments  Date Time Provider Grill  09/16/2021  1:40 PM Kinzley Savell, Melanie Crazier, MD [facility] None  09/22/2021  1:00 PM Hayden Pedro, MD [facility] None  09/23/2021  8:30 AM [facility] [place] [facility] None  09/23/2021  9:00 AM [name], MD [facility] None  09/23/2021 10:00 AM [facility] INFUSION [facility] None  09/26/2021 12:45 PM [facility] ECHO 3 [facility]  10/09/2021  8:30 AM [facility] [place] [facility] None  10/09/2021  9:00 AM [name], NP [facility] None  10/09/2021 10:00 AM [facility] INFUSION [facility] None  10/23/2021  8:45 AM [facility] [place] [facility] None  10/23/2021  9:20 AM [name], MD [facility] None  10/23/2021 10:30 AM [facility] INFUSION [facility] None  11/06/2021  8:30 AM [facility] [place] FLUSH [facility] None  11/06/2021  9:00 AM [name], NP [facility] None  11/06/2021 10:00 AM [facility] INFUSION [facility] None  11/20/2021  9:30 AM [facility] [place] FLUSH [facility] None  11/20/2021 10:00 AM [name], MD [facility] None  11/20/2021 11:00 AM [facility] INFUSION [facility] None  01/07/2022 10:10 AM [name], Raymond J, MD [facility]  03/06/2022 10:40 AM Freada Bergeron, MD [facility]

## 2021-09-17 MED ORDER — LEVOTHYROXINE SODIUM 88 MCG PO TABS: 88.0000 ug | ORAL_TABLET | Freq: Every day | ORAL | 1 refills | Status: DC

## 2021-09-18 ENCOUNTER — Inpatient Hospital Stay: Payer: Medicare Other

## 2021-09-18 ENCOUNTER — Inpatient Hospital Stay: Payer: Medicare Other | Admitting: Hematology

## 2021-09-22 ENCOUNTER — Encounter (INDEPENDENT_AMBULATORY_CARE_PROVIDER_SITE_OTHER): Payer: Medicare Other | Admitting: Ophthalmology

## 2021-09-22 DIAGNOSIS — H353211 Exudative age-related macular degeneration, right eye, with active choroidal neovascularization: Secondary | ICD-10-CM

## 2021-09-22 DIAGNOSIS — H353122 Nonexudative age-related macular degeneration, left eye, intermediate dry stage: Secondary | ICD-10-CM

## 2021-09-22 DIAGNOSIS — H35033 Hypertensive retinopathy, bilateral: Secondary | ICD-10-CM | POA: Diagnosis not present

## 2021-09-22 DIAGNOSIS — I10 Essential (primary) hypertension: Secondary | ICD-10-CM

## 2021-09-22 DIAGNOSIS — H43813 Vitreous degeneration, bilateral: Secondary | ICD-10-CM | POA: Diagnosis not present

## 2021-09-23 ENCOUNTER — Encounter: Payer: Self-pay | Admitting: Hematology

## 2021-09-23 ENCOUNTER — Inpatient Hospital Stay: Payer: Medicare Other

## 2021-09-23 ENCOUNTER — Other Ambulatory Visit: Payer: Self-pay

## 2021-09-23 ENCOUNTER — Inpatient Hospital Stay (HOSPITAL_BASED_OUTPATIENT_CLINIC_OR_DEPARTMENT_OTHER): Payer: Medicare Other | Admitting: Hematology

## 2021-09-23 VITALS — BP 115/60 | HR 69 | Temp 97.7°F | Resp 18 | Ht 71.0 in | Wt 184.0 lb

## 2021-09-23 VITALS — BP 124/61 | HR 66 | Resp 18

## 2021-09-23 DIAGNOSIS — C155 Malignant neoplasm of lower third of esophagus: Secondary | ICD-10-CM

## 2021-09-23 DIAGNOSIS — Z923 Personal history of irradiation: Secondary | ICD-10-CM | POA: Diagnosis not present

## 2021-09-23 DIAGNOSIS — E0590 Thyrotoxicosis, unspecified without thyrotoxic crisis or storm: Secondary | ICD-10-CM | POA: Diagnosis not present

## 2021-09-23 DIAGNOSIS — Z95828 Presence of other vascular implants and grafts: Secondary | ICD-10-CM

## 2021-09-23 DIAGNOSIS — E876 Hypokalemia: Secondary | ICD-10-CM

## 2021-09-23 DIAGNOSIS — C787 Secondary malignant neoplasm of liver and intrahepatic bile duct: Secondary | ICD-10-CM | POA: Diagnosis not present

## 2021-09-23 DIAGNOSIS — K7460 Unspecified cirrhosis of liver: Secondary | ICD-10-CM | POA: Diagnosis not present

## 2021-09-23 DIAGNOSIS — Z5111 Encounter for antineoplastic chemotherapy: Secondary | ICD-10-CM | POA: Diagnosis not present

## 2021-09-23 DIAGNOSIS — E785 Hyperlipidemia, unspecified: Secondary | ICD-10-CM

## 2021-09-23 LAB — CBC WITH DIFFERENTIAL/PLATELET
Abs Immature Granulocytes: 0.01 10*3/uL (ref 0.00–0.07)
Basophils Absolute: 0 10*3/uL (ref 0.0–0.1)
Basophils Relative: 1 %
Eosinophils Absolute: 0.1 10*3/uL (ref 0.0–0.5)
Eosinophils Relative: 3 %
HCT: 30.6 % — ABNORMAL LOW (ref 39.0–52.0)
Hemoglobin: 10.2 g/dL — ABNORMAL LOW (ref 13.0–17.0)
Immature Granulocytes: 0 %
Lymphocytes Relative: 29 %
Lymphs Abs: 0.9 10*3/uL (ref 0.7–4.0)
MCH: 32.8 pg (ref 26.0–34.0)
MCHC: 33.3 g/dL (ref 30.0–36.0)
MCV: 98.4 fL (ref 80.0–100.0)
Monocytes Absolute: 0.3 10*3/uL (ref 0.1–1.0)
Monocytes Relative: 11 %
Neutro Abs: 1.7 10*3/uL (ref 1.7–7.7)
Neutrophils Relative %: 56 %
Platelets: 152 10*3/uL (ref 150–400)
RBC: 3.11 MIL/uL — ABNORMAL LOW (ref 4.22–5.81)
RDW: 15.4 % (ref 11.5–15.5)
WBC: 3 10*3/uL — ABNORMAL LOW (ref 4.0–10.5)
nRBC: 0 % (ref 0.0–0.2)

## 2021-09-23 LAB — COMPREHENSIVE METABOLIC PANEL
ALT: 17 U/L (ref 0–44)
AST: 27 U/L (ref 15–41)
Albumin: 3.4 g/dL — ABNORMAL LOW (ref 3.5–5.0)
Alkaline Phosphatase: 64 U/L (ref 38–126)
Anion gap: 5 (ref 5–15)
BUN: 21 mg/dL (ref 8–23)
CO2: 20 mmol/L — ABNORMAL LOW (ref 22–32)
Calcium: 8.4 mg/dL — ABNORMAL LOW (ref 8.9–10.3)
Chloride: 112 mmol/L — ABNORMAL HIGH (ref 98–111)
Creatinine, Ser: 0.98 mg/dL (ref 0.61–1.24)
GFR, Estimated: >60 mL/min (ref >60)
Glucose, Bld: 151 mg/dL — ABNORMAL HIGH (ref 70–99)
Potassium: 3.9 mmol/L (ref 3.5–5.1)
Sodium: 137 mmol/L (ref 135–145)
Total Bilirubin: 0.7 mg/dL (ref 0.3–1.2)
Total Protein: 6.1 g/dL — ABNORMAL LOW (ref 6.5–8.1)

## 2021-09-23 LAB — LIPID PANEL
Cholesterol: 139 mg/dL (ref 0–200)
HDL: 40 mg/dL — ABNORMAL LOW (ref >40)
LDL Cholesterol: 81 mg/dL (ref 0–99)
Total CHOL/HDL Ratio: 3.5 RATIO
Triglycerides: 90 mg/dL (ref <150)
VLDL: 18 mg/dL (ref 0–40)

## 2021-09-23 LAB — IRON AND IRON BINDING CAPACITY (CC-WL,HP ONLY)
Iron: 65 ug/dL (ref 45–182)
Saturation Ratios: 20 % (ref 17.9–39.5)
TIBC: 323 ug/dL (ref 250–450)
UIBC: 258 ug/dL (ref 117–376)

## 2021-09-23 LAB — TOTAL PROTEIN, URINE DIPSTICK: Protein, ur: <30 mg/dL — AB

## 2021-09-23 LAB — FERRITIN: Ferritin: 32 ng/mL (ref 24–336)

## 2021-09-23 MED ORDER — SODIUM CHLORIDE 0.9 % IV SOLN
8.0000 mg/kg | Freq: Once | INTRAVENOUS | Status: AC
  Administered 2021-09-23: 600 mg via INTRAVENOUS
  Filled 2021-09-23: qty 50

## 2021-09-23 MED ORDER — SODIUM CHLORIDE 0.9% FLUSH
10.0000 mL | INTRAVENOUS | Status: DC | PRN
  Administered 2021-09-23: 10 mL

## 2021-09-23 MED ORDER — SODIUM CHLORIDE 0.9 % IV SOLN
10.0000 mg | Freq: Once | INTRAVENOUS | Status: AC
  Administered 2021-09-23: 10 mg via INTRAVENOUS
  Filled 2021-09-23: qty 10

## 2021-09-23 MED ORDER — SODIUM CHLORIDE 0.9% FLUSH
10.0000 mL | Freq: Once | INTRAVENOUS | Status: AC
  Administered 2021-09-23: 10 mL

## 2021-09-23 MED ORDER — DIPHENHYDRAMINE HCL 50 MG/ML IJ SOLN
25.0000 mg | Freq: Once | INTRAMUSCULAR | Status: AC
  Administered 2021-09-23: 25 mg via INTRAVENOUS
  Filled 2021-09-23: qty 1

## 2021-09-23 MED ORDER — FAMOTIDINE IN NACL 20-0.9 MG/50ML-% IV SOLN
20.0000 mg | Freq: Once | INTRAVENOUS | Status: AC
  Administered 2021-09-23: 20 mg via INTRAVENOUS
  Filled 2021-09-23: qty 50

## 2021-09-23 MED ORDER — SODIUM CHLORIDE 0.9 % IV SOLN: Freq: Once | INTRAVENOUS | Status: AC

## 2021-09-23 MED ORDER — HEPARIN SOD (PORK) LOCK FLUSH 100 UNIT/ML IV SOLN
500.0000 [IU] | Freq: Once | INTRAVENOUS | Status: AC | PRN
  Administered 2021-09-23: 500 [IU]

## 2021-09-23 MED ORDER — SODIUM CHLORIDE 0.9 % IV SOLN
60.0000 mg/m2 | Freq: Once | INTRAVENOUS | Status: AC
  Administered 2021-09-23: 120 mg via INTRAVENOUS
  Filled 2021-09-23: qty 20

## 2021-09-26 ENCOUNTER — Ambulatory Visit (HOSPITAL_COMMUNITY): Payer: Medicare Other | Attending: Cardiology

## 2021-09-26 DIAGNOSIS — Z9221 Personal history of antineoplastic chemotherapy: Secondary | ICD-10-CM | POA: Diagnosis not present

## 2021-09-26 DIAGNOSIS — I3139 Other pericardial effusion (noninflammatory): Secondary | ICD-10-CM | POA: Diagnosis not present

## 2021-09-26 LAB — ECHOCARDIOGRAM LIMITED
Area-P 1/2: 2.21 cm2
S' Lateral: 2.6 cm

## 2021-10-01 ENCOUNTER — Other Ambulatory Visit: Payer: Self-pay

## 2021-10-01 DIAGNOSIS — D539 Nutritional anemia, unspecified: Secondary | ICD-10-CM

## 2021-10-01 DIAGNOSIS — C155 Malignant neoplasm of lower third of esophagus: Secondary | ICD-10-CM

## 2021-10-02 ENCOUNTER — Inpatient Hospital Stay: Payer: Medicare Other | Admitting: Hematology

## 2021-10-02 ENCOUNTER — Inpatient Hospital Stay: Payer: Medicare Other

## 2021-10-03 DIAGNOSIS — Z961 Presence of intraocular lens: Secondary | ICD-10-CM | POA: Diagnosis not present

## 2021-10-03 DIAGNOSIS — H401111 Primary open-angle glaucoma, right eye, mild stage: Secondary | ICD-10-CM | POA: Diagnosis not present

## 2021-10-03 DIAGNOSIS — H401122 Primary open-angle glaucoma, left eye, moderate stage: Secondary | ICD-10-CM | POA: Diagnosis not present

## 2021-10-05 NOTE — Progress Notes (Unsigned)
[place]   [phone] [phone]   Clinic Follow up Note   Patient Care Team: [name], [name], MD as PCP - General (Family Medicine) [name], MD as Consulting Physician (Gastroenterology) [name], MD as Consulting Physician (Hematology) [name], NP as Nurse Practitioner (Nurse Practitioner) [name], RN as Registered Nurse Moya, Carlisle Beers, MD (Ophthalmology) 10/05/2021  CHIEF COMPLAINT: Follow up esophageal cancer   SUMMARY OF ONCOLOGIC HISTORY: Oncology History Overview Note  Cancer Staging No matching staging information was found for the patient.    Primary adenocarcinoma of distal third of esophagus ([place])  11/11/2020 Procedure   EGD by Dr. Watt Climes - Normal larynx. -? Malignant-appearing esophageal stenosis. Biopsied. - Pills were found in the esophagus. Removal was successful. - Rule out malignancy,? gastric tumor in the cardia difficult to say based on bleeding from passing the scope and unable to wash and suction for adequate visualization. - Normal ampulla, duodenal bulb, first portion of the duodenum, second portion of the duodenum, major papilla and area of the papilla. - The examination was otherwise normal.   11/11/2020 Initial Biopsy   FINAL MICROSCOPIC DIAGNOSIS:  A. ESOPHAGUS, DISTAL, BIOPSY:  -  Poorly differentiated adenocarcinoma  -  See comment    11/11/2020 Initial Diagnosis   Primary adenocarcinoma of distal third of esophagus ([place])   11/11/2020 Cancer Staging   Staging form: Esophagus - Adenocarcinoma, AJCC 8th Edition - Clinical stage from 11/11/2020: Stage IVB (cTX, cN1, cM1) - Signed by [name], MD on 12/26/2020   11/14/2020 Imaging   CT CAPIMPRESSION: 1. Aggressive appearing new tumor of the gastroesophageal junction confluent with the irregular collection of right gastric lymph nodes, and with new associated metastatic retroperitoneal adenopathy. Small paraesophageal lymph nodes in  the thorax are increased in size from prior exam and could also be involved, and there is a nonspecific 1.0 cm lesion inferiorly in the right hepatic lobe which could potentially be metastatic. 2. New subacute burst (AO type A4) fracture of L5, with mild posterior bony retropulsion and with a dominant coronally oriented fracture plane, and about 50% loss of height. Correlate with interval trauma. 3. Slight thickening along the left paracolic gutter and trace pelvic ascites, although the ascites is markedly reduced compared to 05/04/2020. 4. Other imaging findings of potential clinical significance: Aortic Atherosclerosis (ICD10-I70.0). Coronary atherosclerosis. Old compression fracture at L2.   12/03/2020 PET scan   IMPRESSION: Tumor at the GE junction with local perigastric infiltration, hepatic and nodal metastases.   Nodal involvement both in the chest and abdomen extending to the lower retroperitoneum in the abdomen.   Burst fracture at L5 shows signs of increased metabolic activity, nonspecific in the setting of fracture, in light of other findings would correlate with any history of trauma, if no history of trauma consider MRI to exclude the possibility of pathologic fracture.   12/27/2020 Genetic Testing   Negative hereditary cancer genetic testing: no pathogenic variants detected in [facility] Multi-Cancer +RNA Panel.  The report date is December 27, 2020.   The Multi-Cancer + RNA Panel offered by [facility] includes sequencing and/or deletion/duplication analysis of the following 84 genes:  AIP*, ALK, APC*, ATM*, AXIN2*, BAP1*, BARD1*, BLM*, BMPR1A*, BRCA1*, BRCA2*, BRIP1*, CASR, CDC73*, CDH1*, CDK4, CDKN1B*, CDKN1C*, CDKN2A, CEBPA, CHEK2*, CTNNA1*, DICER1*, DIS3L2*, EGFR, EPCAM, FH*, FLCN*, GATA2*, GPC3, GREM1, HOXB13, HRAS, KIT, MAX*, MEN1*, MET, MITF, MLH1*, MSH2*, MSH3*, MSH6*, MUTYH*, NBN*, NF1*, NF2*, NTHL1*, PALB2*, PDGFRA, PHOX2B, PMS2*, POLD1*, POLE*, POT1*, PRKAR1A*, PTCH1*,  PTEN*, RAD50*, RAD51C*, RAD51D*, RB1*, RECQL4, RET, RUNX1*, SDHA*, SDHAF2*, SDHB*, SDHC*, SDHD*, SMAD4*, SMARCA4*, SMARCB1*, SMARCE1*, STK11*, SUFU*, TERC, TERT, TMEM127*, Tp53*, TSC1*, TSC2*, VHL*, WRN*, and WT1.  RNA analysis is performed for * genes.   01/07/2021 - 05/07/2021 Chemotherapy   Patient is on Treatment Plan : GASTROESOPHAGEAL Nivolumab q14d x 8 cycles / Nivolumab q28d     03/26/2021 Imaging   CT CAP IMPRESSION: 1. Evidence of treatment response. There is decreased size of the irregular infiltrative mass centered at the gastroesophageal junction as well as decreased size of numerous metastatic retroperitoneal and upper abdominal lymph nodes. 2. 2.1 cm hepatic metastasis at the inferior right hepatic lobe segment 6 is stable to slightly increased in size since previous PET-CT. No new hepatic mass visualized. 3. A few new adjacent small pulmonary nodules in the posterior left lower lobe measuring up to 4 mm in size. Continued follow-up recommended. 4. Small right pleural effusion which is new since previous study. Small pericardial effusion increased since previous study. 5. Other ancillary findings as described.     06/30/2021 Imaging   EXAM: CT CHEST, ABDOMEN, AND PELVIS WITH CONTRAST  IMPRESSION: 1. Worsening nodal disease in the chest and abdomen more so within the abdomen as described. 2. Increasing size of RIGHT pleural fluid and pericardial effusion. Now moderate. 3. Increasing size of RIGHT hepatic lesion. 4. New LEFT adrenal metastasis. 5. Increasing size of retroperitoneal adenopathy. 6. Obstruction of the RIGHT ureter may be related to a metastatic lesion to the RIGHT ureter, blood clot or primary urothelial lesion and is associated with mild hydronephrosis, moderate ureteral dilation and potential filling defect also in lower pole calyces of the RIGHT kidney. 7. Trace perihepatic ascites. 8. Aortic atherosclerosis.   07/17/2021 -  Chemotherapy   Patient is on  Treatment Plan : GASTROESOPHAGEAL Ramucirumab D1, 15  / PACLitaxel D1,8,15 q28d       CURRENT THERAPY: Taxol and Ramucirumab, starting 07/17/21  INTERVAL HISTORY: Randy Wood returns for follow up and treatment as scheduled. Last seen by Dr. Mosetta Putt 09/23/21 and completed another cycle of taxol/cyramza. He is scheduled for restaging CT on 7/25.    REVIEW OF SYSTEMS:   Constitutional: Denies fevers, chills or abnormal weight loss Eyes: Denies blurriness of vision Ears, nose, mouth, throat, and face: Denies mucositis or sore throat Respiratory: Denies cough, dyspnea or wheezes Cardiovascular: Denies palpitation, chest discomfort or lower extremity swelling Gastrointestinal:  Denies nausea, heartburn or change in bowel habits Skin: Denies abnormal skin rashes Lymphatics: Denies new lymphadenopathy or easy bruising Neurological:Denies numbness, tingling or new weaknesses Behavioral/Psych: Mood is stable, no new changes  All other systems were reviewed with the patient and are negative.  MEDICAL HISTORY:  Past Medical History:  Diagnosis Date   AKI (acute kidney injury) ([facility])    Alcohol abuse    Arthritis    Cataract 2006   Diarrhea    Edema leg    Esophageal cancer ([facility])    Family history of breast cancer 12/09/2020   Family history of ovarian cancer 12/09/2020   Family history of prostate cancer 12/09/2020   GERD (gastroesophageal reflux disease)    Glaucoma unknown   Hyperbilirubinemia    Hyperlipemia    Hypertension    Hypokalemia    Liver failure ([facility])    Macular degeneration, wet ([facility])    Microcytic anemia    Osteoarthritis    SOB (shortness of breath)    Weight gain     SURGICAL HISTORY: Past Surgical History:  Procedure Laterality Date   ANKLE SURGERY     BIOPSY  11/11/2020   Procedure: BIOPSY;  Surgeon: [name], MD;  [facility];  Service: Gastroenterology;;   CATARACT EXTRACTION, BILATERAL     ESOPHAGOGASTRODUODENOSCOPY (EGD) WITH PROPOFOL N/A  11/11/2020   Procedure: ESOPHAGOGASTRODUODENOSCOPY (EGD) WITH PROPOFOL;  Surgeon: [name], MD;  [facility];  Service: Gastroenterology;  Laterality: N/A;   EYE SURGERY  2006   FOREIGN BODY REMOVAL  11/11/2020   Procedure: FOREIGN BODY REMOVAL;  Surgeon: [name], MD;  [facility];  Service: Gastroenterology;;   IR IMAGING GUIDED PORT INSERTION  07/16/2021   IR PARACENTESIS  05/09/2020   TONSILLECTOMY      I have reviewed the social history and family history with the patient and they are unchanged from previous note.  ALLERGIES:  is allergic to lisinopril.  MEDICATIONS:  Current Outpatient Medications  Medication Sig Dispense Refill   Azelastine HCl 137 MCG/SPRAY SOLN INSTILL 2 SPRAYS INTO EACH NOSTRIL TWICE A DAY AS DIRECTED 30 mL 1   cetirizine (ZYRTEC) 10 MG tablet Take 10 mg by mouth daily.     dorzolamide-timolol (COSOPT) 22.3-6.8 MG/ML ophthalmic solution Place 1 drop into the left eye 2 (two) times daily.     EPINEPHrine 0.3 mg/0.3 mL IJ SOAJ injection Inject 0.3 mg into the muscle as needed for anaphylaxis.     ezetimibe (ZETIA) 10 MG tablet Take 1 tablet (10 mg total) by mouth daily. 90 tablet 3   Ferrous Sulfate (IRON SLOW RELEASE) 142 (45 Fe) MG TBCR Take 1 tablet by mouth daily. 30 tablet 3   GLUCOSAMINE-CHONDROITIN PO Take 1 capsule by mouth daily.     lactulose (CHRONULAC) 10 GM/15ML solution Take 15 mLs (10 g total) by mouth daily as needed for mild constipation. 236 mL 0   latanoprost (XALATAN) 0.005 % ophthalmic solution Place 1 drop into the left eye at bedtime.     levothyroxine (SYNTHROID) 88 MCG tablet Take 1 tablet (88 mcg total) by mouth daily. 90 tablet 1   lidocaine-prilocaine (EMLA) cream Apply 1 application. topically as needed. 30 g 0   moxifloxacin (VIGAMOX) 0.5 % ophthalmic solution      Multiple Vitamins-Minerals (ONE-A-DAY MENS 50+) TABS Take 1 tablet by mouth daily.     Multiple Vitamins-Minerals (PRESERVISION AREDS 2 PO)  Take 1 tablet by mouth 2 (two) times daily.     ondansetron (ZOFRAN) 8 MG tablet Take 1 tablet (8 mg total) by mouth every 8 (eight) hours as needed for nausea or vomiting. 30 tablet 2   prochlorperazine (COMPAZINE) 10 MG tablet Take 1 tablet (10 mg total) by mouth every 6 (six) hours as needed. 30 tablet 2   spironolactone (ALDACTONE) 50 MG tablet TAKE ONE TABLET BY MOUTH DAILY 90 tablet 0   No current facility-administered medications for this visit.    PHYSICAL EXAMINATION: ECOG PERFORMANCE STATUS: {CHL ONC ECOG PS:[phone]}  There were no vitals filed for this visit. There were no vitals filed for this visit.  GENERAL:alert, no distress and comfortable SKIN: skin color, texture, turgor are normal, no rashes or significant lesions EYES: normal, Conjunctiva are pink and non-injected, sclera clear OROPHARYNX:no exudate, no erythema and lips, buccal mucosa, and tongue normal  NECK: supple, thyroid normal size, non-tender, without nodularity LYMPH:  no palpable lymphadenopathy in the cervical, axillary or inguinal LUNGS: clear to auscultation and percussion with normal breathing effort HEART: regular rate & rhythm and no murmurs and no lower extremity edema ABDOMEN:abdomen  soft, non-tender and normal bowel sounds Musculoskeletal:no cyanosis of digits and no clubbing  NEURO: alert & oriented x 3 with fluent speech, no focal motor/sensory deficits  LABORATORY DATA:  I have reviewed the data as listed    Latest Ref Rng & Units 09/23/2021    9:05 AM 09/11/2021    7:46 AM 09/04/2021    9:45 AM  CBC  WBC 4.0 - 10.5 K/uL 3.0  5.8  2.0   Hemoglobin 13.0 - 17.0 g/dL 10.2  11.0  10.5   Hematocrit 39.0 - 52.0 % 30.6  32.3  31.1   Platelets 150 - 400 K/uL 152  165  155         Latest Ref Rng & Units 09/23/2021    9:05 AM 09/11/2021    7:46 AM 09/04/2021    9:45 AM  CMP  Glucose 70 - 99 mg/dL 151  135  174   BUN 8 - 23 mg/dL $Remove'21  16  16   'CVzifXG$ Creatinine 0.61 - 1.24 mg/dL 0.98  1.10  0.99    Sodium 135 - 145 mmol/L 137  137  136   Potassium 3.5 - 5.1 mmol/L 3.9  4.0  4.0   Chloride 98 - 111 mmol/L 112  109  110   CO2 22 - 32 mmol/L $RemoveB'20  20  20   'BqSrJIlf$ Calcium 8.9 - 10.3 mg/dL 8.4  8.9  8.8   Total Protein 6.5 - 8.1 g/dL 6.1  6.5  6.3   Total Bilirubin 0.3 - 1.2 mg/dL 0.7  0.5  0.5   Alkaline Phos 38 - 126 U/L 64  69  70   AST 15 - 41 U/L $Remo'27  22  24   'dkhJb$ ALT 0 - 44 U/L $Remo'17  12  14       'SiuNA$ RADIOGRAPHIC STUDIES: I have personally reviewed the radiological images as listed and agreed with the findings in the report. No results found.   ASSESSMENT & PLAN:  No problem-specific Assessment & Plan notes found for this encounter.   No orders of the defined types were placed in this encounter.  All questions were answered. The patient knows to call the clinic with any problems, questions or concerns. No barriers to learning was detected. I spent {CHL ONC TIME VISIT - YQIHK:[id number]} counseling the patient face to face. The total time spent in the appointment was {CHL ONC TIME VISIT - FIEPP:[id number]} and more than 50% was on counseling and review of test results     [name], NP 10/05/21

## 2021-10-06 ENCOUNTER — Other Ambulatory Visit: Payer: Self-pay | Admitting: Hematology

## 2021-10-07 ENCOUNTER — Ambulatory Visit: Payer: Medicare Other

## 2021-10-07 ENCOUNTER — Ambulatory Visit: Payer: Medicare Other | Admitting: Nurse Practitioner

## 2021-10-07 ENCOUNTER — Other Ambulatory Visit: Payer: Medicare Other

## 2021-10-08 MED FILL — Dexamethasone Sodium Phosphate Inj 100 MG/10ML: INTRAMUSCULAR | Qty: 1 | Status: AC

## 2021-10-09 ENCOUNTER — Other Ambulatory Visit: Payer: Self-pay

## 2021-10-09 ENCOUNTER — Inpatient Hospital Stay: Payer: Medicare Other

## 2021-10-09 ENCOUNTER — Encounter: Payer: Self-pay | Admitting: Nurse Practitioner

## 2021-10-09 ENCOUNTER — Inpatient Hospital Stay (HOSPITAL_BASED_OUTPATIENT_CLINIC_OR_DEPARTMENT_OTHER): Payer: Medicare Other | Admitting: Nurse Practitioner

## 2021-10-09 ENCOUNTER — Inpatient Hospital Stay: Payer: Medicare Other | Attending: Nurse Practitioner

## 2021-10-09 VITALS — BP 121/58 | HR 72 | Temp 97.6°F | Resp 15 | Wt 184.9 lb

## 2021-10-09 DIAGNOSIS — Z923 Personal history of irradiation: Secondary | ICD-10-CM | POA: Insufficient documentation

## 2021-10-09 DIAGNOSIS — C7972 Secondary malignant neoplasm of left adrenal gland: Secondary | ICD-10-CM | POA: Diagnosis not present

## 2021-10-09 DIAGNOSIS — K7460 Unspecified cirrhosis of liver: Secondary | ICD-10-CM | POA: Insufficient documentation

## 2021-10-09 DIAGNOSIS — Z79890 Hormone replacement therapy (postmenopausal): Secondary | ICD-10-CM | POA: Insufficient documentation

## 2021-10-09 DIAGNOSIS — E0590 Thyrotoxicosis, unspecified without thyrotoxic crisis or storm: Secondary | ICD-10-CM | POA: Diagnosis not present

## 2021-10-09 DIAGNOSIS — Z95828 Presence of other vascular implants and grafts: Secondary | ICD-10-CM

## 2021-10-09 DIAGNOSIS — D539 Nutritional anemia, unspecified: Secondary | ICD-10-CM

## 2021-10-09 DIAGNOSIS — Z79899 Other long term (current) drug therapy: Secondary | ICD-10-CM | POA: Diagnosis not present

## 2021-10-09 DIAGNOSIS — C155 Malignant neoplasm of lower third of esophagus: Secondary | ICD-10-CM

## 2021-10-09 DIAGNOSIS — E876 Hypokalemia: Secondary | ICD-10-CM

## 2021-10-09 DIAGNOSIS — C787 Secondary malignant neoplasm of liver and intrahepatic bile duct: Secondary | ICD-10-CM | POA: Diagnosis not present

## 2021-10-09 DIAGNOSIS — Z5111 Encounter for antineoplastic chemotherapy: Secondary | ICD-10-CM | POA: Diagnosis not present

## 2021-10-09 LAB — COMPREHENSIVE METABOLIC PANEL
ALT: 23 U/L (ref 0–44)
AST: 32 U/L (ref 15–41)
Albumin: 3.4 g/dL — ABNORMAL LOW (ref 3.5–5.0)
Alkaline Phosphatase: 83 U/L (ref 38–126)
Anion gap: 8 (ref 5–15)
BUN: 15 mg/dL (ref 8–23)
CO2: 19 mmol/L — ABNORMAL LOW (ref 22–32)
Calcium: 8.6 mg/dL — ABNORMAL LOW (ref 8.9–10.3)
Chloride: 112 mmol/L — ABNORMAL HIGH (ref 98–111)
Creatinine, Ser: 1.02 mg/dL (ref 0.61–1.24)
GFR, Estimated: >60 mL/min (ref >60)
Glucose, Bld: 178 mg/dL — ABNORMAL HIGH (ref 70–99)
Potassium: 4 mmol/L (ref 3.5–5.1)
Sodium: 139 mmol/L (ref 135–145)
Total Bilirubin: 0.7 mg/dL (ref 0.3–1.2)
Total Protein: 6 g/dL — ABNORMAL LOW (ref 6.5–8.1)

## 2021-10-09 LAB — CBC WITH DIFFERENTIAL/PLATELET
Abs Immature Granulocytes: 0.01 10*3/uL (ref 0.00–0.07)
Basophils Absolute: 0 10*3/uL (ref 0.0–0.1)
Basophils Relative: 2 %
Eosinophils Absolute: 0 10*3/uL (ref 0.0–0.5)
Eosinophils Relative: 2 %
HCT: 30.5 % — ABNORMAL LOW (ref 39.0–52.0)
Hemoglobin: 10.2 g/dL — ABNORMAL LOW (ref 13.0–17.0)
Immature Granulocytes: 0 %
Lymphocytes Relative: 35 %
Lymphs Abs: 0.9 10*3/uL (ref 0.7–4.0)
MCH: 32.6 pg (ref 26.0–34.0)
MCHC: 33.4 g/dL (ref 30.0–36.0)
MCV: 97.4 fL (ref 80.0–100.0)
Monocytes Absolute: 0.5 10*3/uL (ref 0.1–1.0)
Monocytes Relative: 20 %
Neutro Abs: 1 10*3/uL — ABNORMAL LOW (ref 1.7–7.7)
Neutrophils Relative %: 41 %
Platelets: 129 10*3/uL — ABNORMAL LOW (ref 150–400)
RBC: 3.13 MIL/uL — ABNORMAL LOW (ref 4.22–5.81)
RDW: 15.2 % (ref 11.5–15.5)
WBC: 2.4 10*3/uL — ABNORMAL LOW (ref 4.0–10.5)
nRBC: 0 % (ref 0.0–0.2)

## 2021-10-09 LAB — FERRITIN: Ferritin: 29 ng/mL (ref 24–336)

## 2021-10-09 LAB — IRON AND IRON BINDING CAPACITY (CC-WL,HP ONLY)
Iron: 116 ug/dL (ref 45–182)
Saturation Ratios: 37 % (ref 17.9–39.5)
TIBC: 316 ug/dL (ref 250–450)
UIBC: 200 ug/dL (ref 117–376)

## 2021-10-09 LAB — TOTAL PROTEIN, URINE DIPSTICK: Protein, ur: NEGATIVE mg/dL

## 2021-10-09 MED ORDER — SODIUM CHLORIDE 0.9 % IV SOLN
8.0000 mg/kg | Freq: Once | INTRAVENOUS | Status: AC
  Administered 2021-10-09: 600 mg via INTRAVENOUS
  Filled 2021-10-09: qty 50

## 2021-10-09 MED ORDER — SODIUM CHLORIDE 0.9 % IV SOLN
10.0000 mg | Freq: Once | INTRAVENOUS | Status: AC
  Administered 2021-10-09: 10 mg via INTRAVENOUS
  Filled 2021-10-09: qty 10

## 2021-10-09 MED ORDER — DIPHENHYDRAMINE HCL 50 MG/ML IJ SOLN
25.0000 mg | Freq: Once | INTRAMUSCULAR | Status: AC
  Administered 2021-10-09: 25 mg via INTRAVENOUS
  Filled 2021-10-09: qty 1

## 2021-10-09 MED ORDER — SODIUM CHLORIDE 0.9% FLUSH
10.0000 mL | INTRAVENOUS | Status: DC | PRN
  Administered 2021-10-09: 10 mL

## 2021-10-09 MED ORDER — SODIUM CHLORIDE 0.9 % IV SOLN: Freq: Once | INTRAVENOUS | Status: AC

## 2021-10-09 MED ORDER — HEPARIN SOD (PORK) LOCK FLUSH 100 UNIT/ML IV SOLN
500.0000 [IU] | Freq: Once | INTRAVENOUS | Status: AC | PRN
  Administered 2021-10-09: 500 [IU]

## 2021-10-09 MED ORDER — SODIUM CHLORIDE 0.9 % IV SOLN
60.0000 mg/m2 | Freq: Once | INTRAVENOUS | Status: AC
  Administered 2021-10-09: 120 mg via INTRAVENOUS
  Filled 2021-10-09: qty 20

## 2021-10-09 MED ORDER — FAMOTIDINE IN NACL 20-0.9 MG/50ML-% IV SOLN
20.0000 mg | Freq: Once | INTRAVENOUS | Status: AC
  Administered 2021-10-09: 20 mg via INTRAVENOUS
  Filled 2021-10-09: qty 50

## 2021-10-09 MED ORDER — SODIUM CHLORIDE 0.9% FLUSH
10.0000 mL | Freq: Once | INTRAVENOUS | Status: AC
  Administered 2021-10-09: 10 mL

## 2021-10-09 NOTE — Progress Notes (Signed)
Per Lacie, okay to treat with ANC of 1.0 today.

## 2021-10-09 NOTE — Progress Notes (Signed)
Cyramza calculate dose is slightly outside of 10%. Will continue 600 mg of Cyramza and evaluate dosing prior to the next treatment   Larene Beach, PharmD

## 2021-10-09 NOTE — Patient Instructions (Signed)
[facility]  Discharge Instructions: Thank you for choosing [facility] to provide your oncology and hematology care.   If you have a lab appointment with the [facility], please go directly to the [facility] and check in at the registration area.   Wear comfortable clothing and clothing appropriate for easy access to any Portacath or PICC line.   We strive to give you quality time with your provider. You may need to reschedule your appointment if you arrive late (15 or more minutes).  Arriving late affects you and other patients whose appointments are after yours.  Also, if you miss three or more appointments without notifying the office, you may be dismissed from the clinic at the provider's discretion.      For prescription refill requests, have your pharmacy contact our office and allow 72 hours for refills to be completed.    Today you received the following chemotherapy and/or immunotherapy agents: ramucirumab, paclitaxel      To help prevent nausea and vomiting after your treatment, we encourage you to take your nausea medication as directed.  BELOW ARE SYMPTOMS THAT SHOULD BE REPORTED IMMEDIATELY: *FEVER GREATER THAN 100.4 F (38 C) OR HIGHER *CHILLS OR SWEATING *NAUSEA AND VOMITING THAT IS NOT CONTROLLED WITH YOUR NAUSEA MEDICATION *UNUSUAL SHORTNESS OF BREATH *UNUSUAL BRUISING OR BLEEDING *URINARY PROBLEMS (pain or burning when urinating, or frequent urination) *BOWEL PROBLEMS (unusual diarrhea, constipation, pain near the anus) TENDERNESS IN MOUTH AND THROAT WITH OR WITHOUT PRESENCE OF ULCERS (sore throat, sores in mouth, or a toothache) UNUSUAL RASH, SWELLING OR PAIN  UNUSUAL VAGINAL DISCHARGE OR ITCHING   Items with * indicate a potential emergency and should be followed up as soon as possible or go to the Emergency Department if any problems should occur.  Please show the CHEMOTHERAPY ALERT CARD or IMMUNOTHERAPY ALERT CARD  at check-in to the Emergency Department and triage nurse.  Should you have questions after your visit or need to cancel or reschedule your appointment, please contact [facility]  Dept: [phone]  and follow the prompts.  Office hours are 8:00 a.m. to 4:30 p.m. Monday - Friday. Please note that voicemails left after 4:00 p.m. may not be returned until the following business day.  We are closed weekends and major holidays. You have access to a nurse at all times for urgent questions. Please call the main number to the clinic Dept: [phone] and follow the prompts.   For any non-urgent questions, you may also contact your provider using MyChart. We now offer e-Visits for anyone 72 and older to request care online for non-urgent symptoms. For details visit [email or web address].   Also download the MyChart app! Go to the app store, search "MyChart", open the app, select [facility], and log in with your MyChart username and password.  Masks are optional in the cancer centers. If you would like for your care team to wear a mask while they are taking care of you, please let them know. For doctor visits, patients may have with them one support person who is at least 82 years old. At this time, visitors are not allowed in the infusion area.

## 2021-10-20 ENCOUNTER — Other Ambulatory Visit: Payer: Self-pay

## 2021-10-20 ENCOUNTER — Encounter (INDEPENDENT_AMBULATORY_CARE_PROVIDER_SITE_OTHER): Payer: Medicare Other | Admitting: Ophthalmology

## 2021-10-20 DIAGNOSIS — I10 Essential (primary) hypertension: Secondary | ICD-10-CM | POA: Diagnosis not present

## 2021-10-20 DIAGNOSIS — H43813 Vitreous degeneration, bilateral: Secondary | ICD-10-CM | POA: Diagnosis not present

## 2021-10-20 DIAGNOSIS — H353122 Nonexudative age-related macular degeneration, left eye, intermediate dry stage: Secondary | ICD-10-CM | POA: Diagnosis not present

## 2021-10-20 DIAGNOSIS — H353211 Exudative age-related macular degeneration, right eye, with active choroidal neovascularization: Secondary | ICD-10-CM

## 2021-10-20 DIAGNOSIS — H35033 Hypertensive retinopathy, bilateral: Secondary | ICD-10-CM

## 2021-10-21 ENCOUNTER — Other Ambulatory Visit: Payer: Medicare Other

## 2021-10-21 ENCOUNTER — Other Ambulatory Visit: Payer: Self-pay

## 2021-10-21 ENCOUNTER — Ambulatory Visit (HOSPITAL_COMMUNITY)
Admission: RE | Admit: 2021-10-21 | Discharge: 2021-10-21 | Disposition: A | Discharge status: Home / Self Care | Payer: Medicare Other | Source: Ambulatory Visit | Attending: Hematology | Admitting: Hematology

## 2021-10-21 ENCOUNTER — Inpatient Hospital Stay: Payer: Medicare Other

## 2021-10-21 ENCOUNTER — Ambulatory Visit: Payer: Medicare Other | Admitting: Hematology

## 2021-10-21 ENCOUNTER — Ambulatory Visit: Payer: Medicare Other

## 2021-10-21 VITALS — BP 128/70 | HR 75 | Temp 97.7°F | Resp 16

## 2021-10-21 DIAGNOSIS — J9811 Atelectasis: Secondary | ICD-10-CM | POA: Diagnosis not present

## 2021-10-21 DIAGNOSIS — Z923 Personal history of irradiation: Secondary | ICD-10-CM | POA: Diagnosis not present

## 2021-10-21 DIAGNOSIS — Z95828 Presence of other vascular implants and grafts: Secondary | ICD-10-CM

## 2021-10-21 DIAGNOSIS — R590 Localized enlarged lymph nodes: Secondary | ICD-10-CM | POA: Diagnosis not present

## 2021-10-21 DIAGNOSIS — Z5111 Encounter for antineoplastic chemotherapy: Secondary | ICD-10-CM | POA: Diagnosis not present

## 2021-10-21 DIAGNOSIS — K3189 Other diseases of stomach and duodenum: Secondary | ICD-10-CM | POA: Diagnosis not present

## 2021-10-21 DIAGNOSIS — C155 Malignant neoplasm of lower third of esophagus: Secondary | ICD-10-CM | POA: Insufficient documentation

## 2021-10-21 DIAGNOSIS — C787 Secondary malignant neoplasm of liver and intrahepatic bile duct: Secondary | ICD-10-CM | POA: Diagnosis not present

## 2021-10-21 DIAGNOSIS — E876 Hypokalemia: Secondary | ICD-10-CM

## 2021-10-21 DIAGNOSIS — E0590 Thyrotoxicosis, unspecified without thyrotoxic crisis or storm: Secondary | ICD-10-CM | POA: Diagnosis not present

## 2021-10-21 DIAGNOSIS — C7972 Secondary malignant neoplasm of left adrenal gland: Secondary | ICD-10-CM | POA: Diagnosis not present

## 2021-10-21 DIAGNOSIS — R188 Other ascites: Secondary | ICD-10-CM | POA: Diagnosis not present

## 2021-10-21 DIAGNOSIS — J90 Pleural effusion, not elsewhere classified: Secondary | ICD-10-CM | POA: Diagnosis not present

## 2021-10-21 DIAGNOSIS — I3139 Other pericardial effusion (noninflammatory): Secondary | ICD-10-CM | POA: Diagnosis not present

## 2021-10-21 DIAGNOSIS — C159 Malignant neoplasm of esophagus, unspecified: Secondary | ICD-10-CM | POA: Diagnosis not present

## 2021-10-21 MED ORDER — SODIUM CHLORIDE (PF) 0.9 % IJ SOLN
INTRAMUSCULAR | Status: AC
  Filled 2021-10-21: qty 50

## 2021-10-21 MED ORDER — SODIUM CHLORIDE 0.9 % IV SOLN: Freq: Once | INTRAVENOUS | Status: DC

## 2021-10-21 MED ORDER — ALTEPLASE 2 MG IJ SOLR: 2.0000 mg | Freq: Once | INTRAMUSCULAR | Status: DC | PRN

## 2021-10-21 MED ORDER — SODIUM CHLORIDE 0.9% FLUSH: 10.0000 mL | Freq: Once | INTRAVENOUS | Status: DC | PRN

## 2021-10-21 MED ORDER — SODIUM CHLORIDE 0.9% FLUSH: 3.0000 mL | Freq: Once | INTRAVENOUS | Status: DC | PRN

## 2021-10-21 MED ORDER — TBO-FILGRASTIM 480 MCG/0.8ML ~~LOC~~ SOSY
480.0000 ug | PREFILLED_SYRINGE | Freq: Once | SUBCUTANEOUS | Status: DC
  Filled 2021-10-21: qty 0.8

## 2021-10-21 MED ORDER — HEPARIN SOD (PORK) LOCK FLUSH 100 UNIT/ML IV SOLN: 500.0000 [IU] | Freq: Once | INTRAVENOUS | Status: DC | PRN

## 2021-10-21 MED ORDER — SODIUM CHLORIDE 0.9 % IV SOLN: 20.0000 meq | Freq: Once | INTRAVENOUS | Status: DC

## 2021-10-21 MED ORDER — IOHEXOL 300 MG/ML  SOLN
100.0000 mL | Freq: Once | INTRAMUSCULAR | Status: AC | PRN
  Administered 2021-10-21: 100 mL via INTRAVENOUS

## 2021-10-21 MED ORDER — FILGRASTIM-SNDZ 480 MCG/0.8ML IJ SOSY
480.0000 ug | PREFILLED_SYRINGE | Freq: Once | INTRAMUSCULAR | Status: AC
  Administered 2021-10-21: 480 ug via SUBCUTANEOUS
  Filled 2021-10-21: qty 0.8

## 2021-10-21 MED ORDER — HEPARIN SOD (PORK) LOCK FLUSH 100 UNIT/ML IV SOLN: 250.0000 [IU] | Freq: Once | INTRAVENOUS | Status: DC | PRN

## 2021-10-22 MED FILL — Dexamethasone Sodium Phosphate Inj 100 MG/10ML: INTRAMUSCULAR | Qty: 1 | Status: AC

## 2021-10-23 ENCOUNTER — Encounter: Payer: Self-pay | Admitting: Hematology

## 2021-10-23 ENCOUNTER — Inpatient Hospital Stay: Payer: Medicare Other

## 2021-10-23 ENCOUNTER — Inpatient Hospital Stay (HOSPITAL_BASED_OUTPATIENT_CLINIC_OR_DEPARTMENT_OTHER): Payer: Medicare Other | Admitting: Hematology

## 2021-10-23 VITALS — BP 101/60 | HR 70 | Temp 97.7°F | Resp 18 | Ht 71.0 in | Wt 178.6 lb

## 2021-10-23 VITALS — BP 125/67 | HR 68 | Temp 97.6°F | Resp 18

## 2021-10-23 DIAGNOSIS — C787 Secondary malignant neoplasm of liver and intrahepatic bile duct: Secondary | ICD-10-CM | POA: Diagnosis not present

## 2021-10-23 DIAGNOSIS — C155 Malignant neoplasm of lower third of esophagus: Secondary | ICD-10-CM

## 2021-10-23 DIAGNOSIS — Z95828 Presence of other vascular implants and grafts: Secondary | ICD-10-CM

## 2021-10-23 DIAGNOSIS — Z923 Personal history of irradiation: Secondary | ICD-10-CM | POA: Diagnosis not present

## 2021-10-23 DIAGNOSIS — Z5111 Encounter for antineoplastic chemotherapy: Secondary | ICD-10-CM | POA: Diagnosis not present

## 2021-10-23 DIAGNOSIS — E0590 Thyrotoxicosis, unspecified without thyrotoxic crisis or storm: Secondary | ICD-10-CM | POA: Diagnosis not present

## 2021-10-23 DIAGNOSIS — C7972 Secondary malignant neoplasm of left adrenal gland: Secondary | ICD-10-CM | POA: Diagnosis not present

## 2021-10-23 DIAGNOSIS — E876 Hypokalemia: Secondary | ICD-10-CM

## 2021-10-23 LAB — COMPREHENSIVE METABOLIC PANEL
ALT: 20 U/L (ref 0–44)
AST: 33 U/L (ref 15–41)
Albumin: 3.4 g/dL — ABNORMAL LOW (ref 3.5–5.0)
Alkaline Phosphatase: 90 U/L (ref 38–126)
Anion gap: 7 (ref 5–15)
BUN: 16 mg/dL (ref 8–23)
CO2: 20 mmol/L — ABNORMAL LOW (ref 22–32)
Calcium: 8.3 mg/dL — ABNORMAL LOW (ref 8.9–10.3)
Chloride: 112 mmol/L — ABNORMAL HIGH (ref 98–111)
Creatinine, Ser: 1.05 mg/dL (ref 0.61–1.24)
GFR, Estimated: >60 mL/min (ref >60)
Glucose, Bld: 101 mg/dL — ABNORMAL HIGH (ref 70–99)
Potassium: 4.1 mmol/L (ref 3.5–5.1)
Sodium: 139 mmol/L (ref 135–145)
Total Bilirubin: 0.7 mg/dL (ref 0.3–1.2)
Total Protein: 6.2 g/dL — ABNORMAL LOW (ref 6.5–8.1)

## 2021-10-23 LAB — CBC WITH DIFFERENTIAL/PLATELET
Abs Immature Granulocytes: 0.02 10*3/uL (ref 0.00–0.07)
Basophils Absolute: 0.1 10*3/uL (ref 0.0–0.1)
Basophils Relative: 1 %
Eosinophils Absolute: 0.1 10*3/uL (ref 0.0–0.5)
Eosinophils Relative: 2 %
HCT: 32.2 % — ABNORMAL LOW (ref 39.0–52.0)
Hemoglobin: 10.7 g/dL — ABNORMAL LOW (ref 13.0–17.0)
Immature Granulocytes: 1 %
Lymphocytes Relative: 23 %
Lymphs Abs: 1 10*3/uL (ref 0.7–4.0)
MCH: 31.6 pg (ref 26.0–34.0)
MCHC: 33.2 g/dL (ref 30.0–36.0)
MCV: 95 fL (ref 80.0–100.0)
Monocytes Absolute: 0.9 10*3/uL (ref 0.1–1.0)
Monocytes Relative: 21 %
Neutro Abs: 2.2 10*3/uL (ref 1.7–7.7)
Neutrophils Relative %: 52 %
Platelets: 141 10*3/uL — ABNORMAL LOW (ref 150–400)
RBC: 3.39 MIL/uL — ABNORMAL LOW (ref 4.22–5.81)
RDW: 14.9 % (ref 11.5–15.5)
WBC: 4.3 10*3/uL (ref 4.0–10.5)
nRBC: 0.5 % — ABNORMAL HIGH (ref 0.0–0.2)

## 2021-10-23 LAB — IRON AND IRON BINDING CAPACITY (CC-WL,HP ONLY)
Iron: 119 ug/dL (ref 45–182)
Saturation Ratios: 39 % (ref 17.9–39.5)
TIBC: 308 ug/dL (ref 250–450)
UIBC: 189 ug/dL (ref 117–376)

## 2021-10-23 LAB — TOTAL PROTEIN, URINE DIPSTICK: Protein, ur: <30 mg/dL — AB

## 2021-10-23 LAB — FERRITIN: Ferritin: 47 ng/mL (ref 24–336)

## 2021-10-23 MED ORDER — SODIUM CHLORIDE 0.9% FLUSH
10.0000 mL | INTRAVENOUS | Status: DC | PRN
  Administered 2021-10-23: 10 mL

## 2021-10-23 MED ORDER — SODIUM CHLORIDE 0.9% FLUSH
10.0000 mL | Freq: Once | INTRAVENOUS | Status: AC
  Administered 2021-10-23: 10 mL

## 2021-10-23 MED ORDER — SODIUM CHLORIDE 0.9 % IV SOLN
60.0000 mg/m2 | Freq: Once | INTRAVENOUS | Status: AC
  Administered 2021-10-23: 120 mg via INTRAVENOUS
  Filled 2021-10-23: qty 20

## 2021-10-23 MED ORDER — FAMOTIDINE IN NACL 20-0.9 MG/50ML-% IV SOLN
20.0000 mg | Freq: Once | INTRAVENOUS | Status: AC
  Administered 2021-10-23: 20 mg via INTRAVENOUS
  Filled 2021-10-23: qty 50

## 2021-10-23 MED ORDER — HEPARIN SOD (PORK) LOCK FLUSH 100 UNIT/ML IV SOLN
500.0000 [IU] | Freq: Once | INTRAVENOUS | Status: AC | PRN
  Administered 2021-10-23: 500 [IU]

## 2021-10-23 MED ORDER — SODIUM CHLORIDE 0.9 % IV SOLN
8.0000 mg/kg | Freq: Once | INTRAVENOUS | Status: AC
  Administered 2021-10-23: 600 mg via INTRAVENOUS
  Filled 2021-10-23: qty 50

## 2021-10-23 MED ORDER — SODIUM CHLORIDE 0.9 % IV SOLN
10.0000 mg | Freq: Once | INTRAVENOUS | Status: AC
  Administered 2021-10-23: 10 mg via INTRAVENOUS
  Filled 2021-10-23: qty 10

## 2021-10-23 MED ORDER — SODIUM CHLORIDE 0.9 % IV SOLN: Freq: Once | INTRAVENOUS | Status: AC

## 2021-10-23 MED ORDER — DIPHENHYDRAMINE HCL 50 MG/ML IJ SOLN
25.0000 mg | Freq: Once | INTRAMUSCULAR | Status: AC
  Administered 2021-10-23: 25 mg via INTRAVENOUS
  Filled 2021-10-23: qty 1

## 2021-10-23 NOTE — Patient Instructions (Signed)
[facility]  Discharge Instructions: Thank you for choosing [facility] to provide your oncology and hematology care.   If you have a lab appointment with the [facility], please go directly to the [facility] and check in at the registration area.   Wear comfortable clothing and clothing appropriate for easy access to any Portacath or PICC line.   We strive to give you quality time with your provider. You may need to reschedule your appointment if you arrive late (15 or more minutes).  Arriving late affects you and other patients whose appointments are after yours.  Also, if you miss three or more appointments without notifying the office, you may be dismissed from the clinic at the provider's discretion.      For prescription refill requests, have your pharmacy contact our office and allow 72 hours for refills to be completed.    Today you received the following chemotherapy and/or immunotherapy agents: Ramucirumab, Paclitaxel      To help prevent nausea and vomiting after your treatment, we encourage you to take your nausea medication as directed.  BELOW ARE SYMPTOMS THAT SHOULD BE REPORTED IMMEDIATELY: *FEVER GREATER THAN 100.4 F (38 C) OR HIGHER *CHILLS OR SWEATING *NAUSEA AND VOMITING THAT IS NOT CONTROLLED WITH YOUR NAUSEA MEDICATION *UNUSUAL SHORTNESS OF BREATH *UNUSUAL BRUISING OR BLEEDING *URINARY PROBLEMS (pain or burning when urinating, or frequent urination) *BOWEL PROBLEMS (unusual diarrhea, constipation, pain near the anus) TENDERNESS IN MOUTH AND THROAT WITH OR WITHOUT PRESENCE OF ULCERS (sore throat, sores in mouth, or a toothache) UNUSUAL RASH, SWELLING OR PAIN  UNUSUAL VAGINAL DISCHARGE OR ITCHING   Items with * indicate a potential emergency and should be followed up as soon as possible or go to the Emergency Department if any problems should occur.  Please show the CHEMOTHERAPY ALERT CARD or IMMUNOTHERAPY ALERT CARD  at check-in to the Emergency Department and triage nurse.  Should you have questions after your visit or need to cancel or reschedule your appointment, please contact [facility]  Dept: [phone]  and follow the prompts.  Office hours are 8:00 a.m. to 4:30 p.m. Monday - Friday. Please note that voicemails left after 4:00 p.m. may not be returned until the following business day.  We are closed weekends and major holidays. You have access to a nurse at all times for urgent questions. Please call the main number to the clinic Dept: [phone] and follow the prompts.   For any non-urgent questions, you may also contact your provider using MyChart. We now offer e-Visits for anyone 75 and older to request care online for non-urgent symptoms. For details visit [email or web address].   Also download the MyChart app! Go to the app store, search "MyChart", open the app, select [facility], and log in with your MyChart username and password.  Masks are optional in the cancer centers. If you would like for your care team to wear a mask while they are taking care of you, please let them know. For doctor visits, patients may have with them one support person who is at least 82 years old. At this time, visitors are not allowed in the infusion area.

## 2021-10-23 NOTE — Progress Notes (Signed)
[facility]   Telephone:(336) [phone] Fax:(336) [phone]   Clinic Follow up Note   Patient Care Team: [name], [name], MD as PCP - General (Family Medicine) [name], MD as Consulting Physician (Gastroenterology) [name], MD as Consulting Physician (Hematology) [name], NP as Nurse Practitioner (Nurse Practitioner) [name], RN as Registered Nurse Moya, Carlisle Beers, MD (Ophthalmology)  Date of Service:  10/23/2021  CHIEF COMPLAINT: f/u of esophageal cancer  CURRENT THERAPY:  Taxol and ramucirumab, starting 07/17/21             -currently q14d  ASSESSMENT & PLAN:  Randy Wood is a 81 y.o. male with   1. 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 Molecular testing showed MMR normal, PD-L1 CPS 3%. -PET 12/03/20 showed: esophageal tumor with local perigastric infiltration; hepatic and nodal metastasis involving the chest and abdomen extending to lower retroperitoneum; burst fracture at L5 with increased metabolic activity -He received palliative radiation to the primary tumor 9/15 - 12/27/20 -He began single agent Xeloda 1 week on/1 week off and nivolumab every 2 weeks on 01/07/21 -restaging CT on 06/30/21 showed worsening disease within nodes in chest and abdomen, liver, adrenal gland, and retroperitoneal nodes.  -we switched to taxol on 07/17/21, port placed the day before. He experienced abdominal cramps and diarrhea for 4-5 days after day 8. Treatment changed to every other week due to intolerance.  -restaging CT CAP on 10/21/21 showed: similar esophageal thickening; improvement to lymphadenopathy and left adrenal lesion; favored treatment response  in hepatic metastasis; stable pelvic free fluid and pericardial and pleural effusions. I reviewed the images and discussed the results with them today.  Given a good partial response, will continue current treatment. -labs reviewed, hgb 10.7. Urine protein <30. Adequate to continue treatment.   2. Side effects (anemia, muscle cramps, electrolyte derangements, etc.) -Patient's iron studies show low ferritin and low iron and saturation on 09/04/21. Instructed to start iron supplement. He started this and has been tolerating it well without any GI side effects.  -Encouraged to increase his intake of calcium rich foods and increase his hydration which will likely help with muscle cramps.   3. Hyperthyroidism  -secondary to nivolumab -initially noted on 05/07/21 labs-- T4 17.5 and TSH <0.08. He is asymptomatic. -he established care with Dr. Kelton Pillar on 06/11/21. Repeat labs on 07/15/21 showed TSH 70.63 and free T4 0.31. He was started on synthroid on 07/16/21.   4. Cirrhosis, abdominal pain -felt to be alcohol-related. He has abstained from alcohol since 04/2020 -S/p paracentesis 05/09/20 yielding 4.4 liters, path showed reactive mesothelial cells -Compensated, hepatic function panel 09/17/20 WNL -Continue spironolactone.  Hold Lasix -followed by GI   5. Social support -He is widowed and lives alone, his wife died from [place] in 05/20/2015, she was treated here -2 daughters live out of town, [name] lives in [place] and is the closest, also his primary contact for healthcare -He has church community and some family friends who could help if needed -He has met outpatient palliative care -Patient is independent with ADLs but has a Secretary/administrator and yard service.    6. Family history  -Patient's mother had ovarian cancer, father had prostate cancer, and a daughter has breast cancer at age  47 -His daughter [name] without cancer underwent genetic testing and is negative -He was referred to genetics and underwent  testing 12/09/20, results were negative   7. Glaucoma, hyperlipidemia, comorbitities.  -The patient reports that Flonase was discontinued by his glaucoma specialist Dr. Ander Slade at the [facility] eye clinic in [place].  This was due to it contributing to increase in intraocular pressure. -his intraocular pressure is being checked by Dr. Ander Slade     PLAN: -proceed with taxol and ramucizumab at same reduced dose today -lab, flush, f/u, and taxol and ramucizumab every 2 weeks as scheduled   No problem-specific Assessment & Plan notes found for this encounter.   SUMMARY OF ONCOLOGIC HISTORY: Oncology History Overview Note  Cancer Staging No matching staging information was found for the patient.    Primary adenocarcinoma of distal third of esophagus ([facility])  11/11/2020 Procedure   EGD by Dr. Watt Climes - Normal larynx. -? Malignant-appearing esophageal stenosis. Biopsied. - Pills were found in the esophagus. Removal was successful. - Rule out malignancy,? gastric tumor in the cardia difficult to say based on bleeding from passing the scope and unable to wash and suction for adequate visualization. - Normal ampulla, duodenal bulb, first portion of the duodenum, second portion of the duodenum, major papilla and area of the papilla. - The examination was otherwise normal.   11/11/2020 Initial Biopsy   FINAL MICROSCOPIC DIAGNOSIS:  A. ESOPHAGUS, DISTAL, BIOPSY:  -  Poorly differentiated adenocarcinoma  -  See comment    11/11/2020 Initial Diagnosis   Primary adenocarcinoma of distal third of esophagus ([facility])   11/11/2020 Cancer Staging   Staging form: Esophagus - Adenocarcinoma, AJCC 8th Edition - Clinical stage from 11/11/2020: Stage IVB (cTX, cN1, cM1) - Signed by [name], MD on 12/26/2020   11/14/2020 Imaging   CT CAPIMPRESSION: 1. Aggressive appearing new tumor of the gastroesophageal junction confluent with the irregular collection of right gastric lymph nodes, and with new associated  metastatic retroperitoneal adenopathy. Small paraesophageal lymph nodes in the thorax are increased in size from prior exam and could also be involved, and there is a nonspecific 1.0 cm lesion inferiorly in the right hepatic lobe which could potentially be metastatic. 2. New subacute burst (AO type A4) fracture of L5, with mild posterior bony retropulsion and with a dominant coronally oriented fracture plane, and about 50% loss of height. Correlate with interval trauma. 3. Slight thickening along the left paracolic gutter and trace pelvic ascites, although the ascites is markedly reduced compared to 05/04/2020. 4. Other imaging findings of potential clinical significance: Aortic Atherosclerosis (ICD10-I70.0). Coronary atherosclerosis. Old compression fracture at L2.   12/03/2020 PET scan   IMPRESSION: Tumor at the GE junction with local perigastric infiltration, hepatic and nodal metastases.   Nodal involvement both in the chest and abdomen extending to the lower retroperitoneum in the abdomen.   Burst fracture at L5 shows signs of increased metabolic activity, nonspecific in the setting of fracture, in light of other findings would correlate with any history of trauma, if no history of trauma consider MRI to exclude the possibility of pathologic fracture.   12/27/2020 Genetic Testing   Negative hereditary cancer genetic testing: no pathogenic variants detected in [facility] Multi-Cancer +RNA Panel.  The report date is December 27, 2020.   The Multi-Cancer + RNA Panel offered by [facility] includes sequencing and/or deletion/duplication analysis of the following 84 genes:  AIP*, ALK, APC*, ATM*, AXIN2*, BAP1*, BARD1*, BLM*, BMPR1A*, BRCA1*, BRCA2*, BRIP1*, CASR, CDC73*, CDH1*, CDK4, CDKN1B*, CDKN1C*, CDKN2A,  CEBPA, CHEK2*, CTNNA1*, DICER1*, DIS3L2*, EGFR, EPCAM, FH*, FLCN*, GATA2*, GPC3, GREM1, HOXB13, HRAS, KIT, MAX*, MEN1*, MET, MITF, MLH1*, MSH2*, MSH3*, MSH6*, MUTYH*, NBN*, NF1*, NF2*,  NTHL1*, PALB2*, PDGFRA, PHOX2B, PMS2*, POLD1*, POLE*, POT1*, PRKAR1A*, PTCH1*, PTEN*, RAD50*, RAD51C*, RAD51D*, RB1*, RECQL4, RET, RUNX1*, SDHA*, SDHAF2*, SDHB*, SDHC*, SDHD*, SMAD4*, SMARCA4*, SMARCB1*, SMARCE1*, STK11*, SUFU*, TERC, TERT, TMEM127*, Tp53*, TSC1*, TSC2*, VHL*, WRN*, and WT1.  RNA analysis is performed for * genes.   01/07/2021 - 05/07/2021 Chemotherapy   Patient is on Treatment Plan : GASTROESOPHAGEAL Nivolumab q14d x 8 cycles / Nivolumab q28d     03/26/2021 Imaging   CT CAP IMPRESSION: 1. Evidence of treatment response. There is decreased size of the irregular infiltrative mass centered at the gastroesophageal junction as well as decreased size of numerous metastatic retroperitoneal and upper abdominal lymph nodes. 2. 2.1 cm hepatic metastasis at the inferior right hepatic lobe segment 6 is stable to slightly increased in size since previous PET-CT. No new hepatic mass visualized. 3. A few new adjacent small pulmonary nodules in the posterior left lower lobe measuring up to 4 mm in size. Continued follow-up recommended. 4. Small right pleural effusion which is new since previous study. Small pericardial effusion increased since previous study. 5. Other ancillary findings as described.     06/30/2021 Imaging   EXAM: CT CHEST, ABDOMEN, AND PELVIS WITH CONTRAST  IMPRESSION: 1. Worsening nodal disease in the chest and abdomen more so within the abdomen as described. 2. Increasing size of RIGHT pleural fluid and pericardial effusion. Now moderate. 3. Increasing size of RIGHT hepatic lesion. 4. New LEFT adrenal metastasis. 5. Increasing size of retroperitoneal adenopathy. 6. Obstruction of the RIGHT ureter may be related to a metastatic lesion to the RIGHT ureter, blood clot or primary urothelial lesion and is associated with mild hydronephrosis, moderate ureteral dilation and potential filling defect also in lower pole calyces of the RIGHT kidney. 7. Trace perihepatic  ascites. 8. Aortic atherosclerosis.   07/17/2021 -  Chemotherapy   Patient is on Treatment Plan : GASTROESOPHAGEAL Ramucirumab D1, 15  / PACLitaxel D1,8,15 q28d        INTERVAL HISTORY:  Randy Wood is here for a follow up of esophageal cancer. He was last seen by NP Lacie on 10/09/21. He presents to the clinic accompanied by his daughters. He reports he is cold. He notes he otherwise tolerated treatment well.   All other systems were reviewed with the patient and are negative.  MEDICAL HISTORY:  Past Medical History:  Diagnosis Date   AKI (acute kidney injury) ([facility])    Alcohol abuse    Arthritis    Cataract 2006   Diarrhea    Edema leg    Esophageal cancer ([facility])    Family history of breast cancer 12/09/2020   Family history of ovarian cancer 12/09/2020   Family history of prostate cancer 12/09/2020   GERD (gastroesophageal reflux disease)    Glaucoma unknown   Hyperbilirubinemia    Hyperlipemia    Hypertension    Hypokalemia    Liver failure (HCC)    Macular degeneration, wet ([facility])    Microcytic anemia    Osteoarthritis    SOB (shortness of breath)    Weight gain     SURGICAL HISTORY: Past Surgical History:  Procedure Laterality Date   ANKLE SURGERY     BIOPSY  11/11/2020   Procedure: BIOPSY;  Surgeon: [name], MD;  Location: [facility];  Service: Gastroenterology;;   CATARACT EXTRACTION, BILATERAL  ESOPHAGOGASTRODUODENOSCOPY (EGD) WITH PROPOFOL N/A 11/11/2020   Procedure: ESOPHAGOGASTRODUODENOSCOPY (EGD) WITH PROPOFOL;  Surgeon: [name], MD;  Location: [facility];  Service: Gastroenterology;  Laterality: N/A;   EYE SURGERY  2006   FOREIGN BODY REMOVAL  11/11/2020   Procedure: FOREIGN BODY REMOVAL;  Surgeon: [name], MD;  Location: [facility];  Service: Gastroenterology;;   IR IMAGING GUIDED PORT INSERTION  07/16/2021   IR PARACENTESIS  05/09/2020   TONSILLECTOMY      I have reviewed the social history and family history with the  patient and they are unchanged from previous note.  ALLERGIES:  is allergic to lisinopril.  MEDICATIONS:  Current Outpatient Medications  Medication Sig Dispense Refill   Azelastine HCl 137 MCG/SPRAY SOLN INSTILL 2 SPRAYS INTO EACH NOSTRIL TWICE A DAY AS DIRECTED 30 mL 1   cetirizine (ZYRTEC) 10 MG tablet Take 10 mg by mouth daily.     dorzolamide-timolol (COSOPT) 22.3-6.8 MG/ML ophthalmic solution Place 1 drop into the left eye 2 (two) times daily.     EPINEPHrine 0.3 mg/0.3 mL IJ SOAJ injection Inject 0.3 mg into the muscle as needed for anaphylaxis.     ezetimibe (ZETIA) 10 MG tablet Take 1 tablet (10 mg total) by mouth daily. 90 tablet 3   Ferrous Sulfate (IRON SLOW RELEASE) 142 (45 Fe) MG TBCR Take 1 tablet by mouth daily. 30 tablet 3   GLUCOSAMINE-CHONDROITIN PO Take 1 capsule by mouth daily.     lactulose (CHRONULAC) 10 GM/15ML solution Take 15 mLs (10 g total) by mouth daily as needed for mild constipation. 236 mL 0   latanoprost (XALATAN) 0.005 % ophthalmic solution Place 1 drop into the left eye at bedtime.     levothyroxine (SYNTHROID) 88 MCG tablet Take 1 tablet (88 mcg total) by mouth daily. 90 tablet 1   lidocaine-prilocaine (EMLA) cream Apply 1 application. topically as needed. 30 g 0   moxifloxacin (VIGAMOX) 0.5 % ophthalmic solution      Multiple Vitamins-Minerals (ONE-A-DAY MENS 50+) TABS Take 1 tablet by mouth daily.     Multiple Vitamins-Minerals (PRESERVISION AREDS 2 PO) Take 1 tablet by mouth 2 (two) times daily.     ondansetron (ZOFRAN) 8 MG tablet Take 1 tablet (8 mg total) by mouth every 8 (eight) hours as needed for nausea or vomiting. 30 tablet 2   prochlorperazine (COMPAZINE) 10 MG tablet Take 1 tablet (10 mg total) by mouth every 6 (six) hours as needed. 30 tablet 2   spironolactone (ALDACTONE) 50 MG tablet TAKE ONE TABLET BY MOUTH DAILY 90 tablet 0   No current facility-administered medications for this visit.    PHYSICAL EXAMINATION: ECOG PERFORMANCE  STATUS: 2 - Symptomatic, <50% confined to bed  Vitals:   10/23/21 0903  BP: 101/60  Pulse: 70  Resp: 18  Temp: 97.7 F (36.5 C)  SpO2: 100%   Wt Readings from Last 3 Encounters:  10/23/21 178 lb 9.6 oz (81 kg)  10/09/21 184 lb 14.4 oz (83.9 kg)  09/23/21 184 lb (83.5 kg)     GENERAL:alert, no distress and comfortable SKIN: skin color normal, no rashes or significant lesions EYES: normal, Conjunctiva are pink and non-injected, sclera clear  NEURO: alert & oriented x 3 with fluent speech  LABORATORY DATA:  I have reviewed the data as listed    Latest Ref Rng & Units 10/23/2021    8:39 AM 10/09/2021    9:04 AM 09/23/2021    9:05 AM  CBC  WBC 4.0 - 10.5  K/uL 4.3  2.4  3.0   Hemoglobin 13.0 - 17.0 g/dL 10.7  10.2  10.2   Hematocrit 39.0 - 52.0 % 32.2  30.5  30.6   Platelets 150 - 400 K/uL 141  129  152         Latest Ref Rng & Units 10/23/2021    8:39 AM 10/09/2021    9:04 AM 09/23/2021    9:05 AM  CMP  Glucose 70 - 99 mg/dL 101  178  151   BUN 8 - 23 mg/dL _0 Creatinine 0.61 - 1.24 mg/dL 1.05  1.02  0.98   Sodium 135 - 145 mmol/L 139  139  137   Potassium 3.5 - 5.1 mmol/L 4.1  4.0  3.9   Chloride 98 - 111 mmol/L 112  112  112   CO2 22 - 32 mmol/L _1 Calcium 8.9 - 10.3 mg/dL 8.3  8.6  8.4   Total Protein 6.5 - 8.1 g/dL 6.2  6.0  6.1   Total Bilirubin 0.3 - 1.2 mg/dL 0.7  0.7  0.7   Alkaline Phos 38 - 126 U/L 90  83  64   AST 15 - 41 U/L 33  32  27   ALT 0 - 44 U/L _2 RADIOGRAPHIC STUDIES: I have personally reviewed the radiological images as listed and agreed with the findings in the report. No results found.    No orders of the defined types were placed in this encounter.  All questions were answered. The patient knows to call the clinic with any problems, questions or concerns. No barriers to learning was detected. The total time spent in the appointment was 30 minutes.     [name], MD 10/23/2021   I, Wilburn Mylar, am acting as scribe for [name], MD.   I have reviewed the above documentation for accuracy and completeness, and I agree with the above.

## 2021-10-23 NOTE — Progress Notes (Signed)
Ok to treat today before the [facility] results per Dr. Burr Medico. WBC 4.3

## 2021-11-03 NOTE — Progress Notes (Unsigned)
[facility]   [phone] [phone]   Clinic Follow up Note   Patient Care Team: [name], [name], MD as PCP - General (Family Medicine) [name], MD as Consulting Physician (Gastroenterology) [name], MD as Consulting Physician (Hematology) [name], NP as Nurse Practitioner (Nurse Practitioner) [name], RN as Registered Nurse Moya, Carlisle Beers, MD (Ophthalmology) 11/03/2021  CHIEF COMPLAINT: Follow-up esophagus cancer  SUMMARY OF ONCOLOGIC HISTORY: Oncology History Overview Note  Cancer Staging No matching staging information was found for the patient.    Primary adenocarcinoma of distal third of esophagus ([place])  11/11/2020 Procedure   EGD by Dr. Watt Climes - Normal larynx. -? Malignant-appearing esophageal stenosis. Biopsied. - Pills were found in the esophagus. Removal was successful. - Rule out malignancy,? gastric tumor in the cardia difficult to say based on bleeding from passing the scope and unable to wash and suction for adequate visualization. - Normal ampulla, duodenal bulb, first portion of the duodenum, second portion of the duodenum, major papilla and area of the papilla. - The examination was otherwise normal.   11/11/2020 Initial Biopsy   FINAL MICROSCOPIC DIAGNOSIS:  A. ESOPHAGUS, DISTAL, BIOPSY:  -  Poorly differentiated adenocarcinoma  -  See comment    11/11/2020 Initial Diagnosis   Primary adenocarcinoma of distal third of esophagus ([place])   11/11/2020 Cancer Staging   Staging form: Esophagus - Adenocarcinoma, AJCC 8th Edition - Clinical stage from 11/11/2020: Stage IVB (cTX, cN1, cM1) - Signed by [name], MD on 12/26/2020   11/14/2020 Imaging   CT CAPIMPRESSION: 1. Aggressive appearing new tumor of the gastroesophageal junction confluent with the irregular collection of right gastric lymph nodes, and with new associated metastatic retroperitoneal adenopathy. Small paraesophageal lymph nodes in the  thorax are increased in size from prior exam and could also be involved, and there is a nonspecific 1.0 cm lesion inferiorly in the right hepatic lobe which could potentially be metastatic. 2. New subacute burst (AO type A4) fracture of L5, with mild posterior bony retropulsion and with a dominant coronally oriented fracture plane, and about 50% loss of height. Correlate with interval trauma. 3. Slight thickening along the left paracolic gutter and trace pelvic ascites, although the ascites is markedly reduced compared to 05/04/2020. 4. Other imaging findings of potential clinical significance: Aortic Atherosclerosis (ICD10-I70.0). Coronary atherosclerosis. Old compression fracture at L2.   12/03/2020 PET scan   IMPRESSION: Tumor at the GE junction with local perigastric infiltration, hepatic and nodal metastases.   Nodal involvement both in the chest and abdomen extending to the lower retroperitoneum in the abdomen.   Burst fracture at L5 shows signs of increased metabolic activity, nonspecific in the setting of fracture, in light of other findings would correlate with any history of trauma, if no history of trauma consider MRI to exclude the possibility of pathologic fracture.   12/27/2020 Genetic Testing   Negative hereditary cancer genetic testing: no pathogenic variants detected in [facility] Multi-Cancer +RNA Panel.  The report date is December 27, 2020.   The Multi-Cancer + RNA Panel offered by [facility] includes sequencing and/or deletion/duplication analysis of the following 84 genes:  AIP*, ALK, APC*, ATM*, AXIN2*, BAP1*, BARD1*, BLM*, BMPR1A*, BRCA1*, BRCA2*, BRIP1*, CASR, CDC73*, CDH1*, CDK4, CDKN1B*, CDKN1C*, CDKN2A, CEBPA, CHEK2*, CTNNA1*, DICER1*, DIS3L2*, EGFR, EPCAM, FH*, FLCN*, GATA2*, GPC3, GREM1, HOXB13, HRAS, KIT, MAX*, MEN1*, MET, MITF, MLH1*, MSH2*, MSH3*, MSH6*, MUTYH*, NBN*, NF1*, NF2*, NTHL1*, PALB2*, PDGFRA, PHOX2B, PMS2*, POLD1*, POLE*, POT1*, PRKAR1A*, PTCH1*,  PTEN*,  RAD50*, RAD51C*, RAD51D*, RB1*, RECQL4, RET, RUNX1*, SDHA*, SDHAF2*, SDHB*, SDHC*, SDHD*, SMAD4*, SMARCA4*, SMARCB1*, SMARCE1*, STK11*, SUFU*, TERC, TERT, TMEM127*, Tp53*, TSC1*, TSC2*, VHL*, WRN*, and WT1.  RNA analysis is performed for * genes.   01/07/2021 - 05/07/2021 Chemotherapy   Patient is on Treatment Plan : GASTROESOPHAGEAL Nivolumab q14d x 8 cycles / Nivolumab q28d     03/26/2021 Imaging   CT CAP IMPRESSION: 1. Evidence of treatment response. There is decreased size of the irregular infiltrative mass centered at the gastroesophageal junction as well as decreased size of numerous metastatic retroperitoneal and upper abdominal lymph nodes. 2. 2.1 cm hepatic metastasis at the inferior right hepatic lobe segment 6 is stable to slightly increased in size since previous PET-CT. No new hepatic mass visualized. 3. A few new adjacent small pulmonary nodules in the posterior left lower lobe measuring up to 4 mm in size. Continued follow-up recommended. 4. Small right pleural effusion which is new since previous study. Small pericardial effusion increased since previous study. 5. Other ancillary findings as described.     06/30/2021 Imaging   EXAM: CT CHEST, ABDOMEN, AND PELVIS WITH CONTRAST  IMPRESSION: 1. Worsening nodal disease in the chest and abdomen more so within the abdomen as described. 2. Increasing size of RIGHT pleural fluid and pericardial effusion. Now moderate. 3. Increasing size of RIGHT hepatic lesion. 4. New LEFT adrenal metastasis. 5. Increasing size of retroperitoneal adenopathy. 6. Obstruction of the RIGHT ureter may be related to a metastatic lesion to the RIGHT ureter, blood clot or primary urothelial lesion and is associated with mild hydronephrosis, moderate ureteral dilation and potential filling defect also in lower pole calyces of the RIGHT kidney. 7. Trace perihepatic ascites. 8. Aortic atherosclerosis.   07/17/2021 -  Chemotherapy   Patient is on  Treatment Plan : GASTROESOPHAGEAL Ramucirumab D1, 15  / PACLitaxel D1,8,15 q28d       CURRENT THERAPY:  Taxol and ramucirumab, starting 07/17/21             -currently q14d  INTERVAL HISTORY: Mr. Randy Wood returns for follow-up and treatment as scheduled, last seen by Dr. Burr Medico 10/23/2021 and completed another cycle of Taxol/ramucirumab   REVIEW OF SYSTEMS:   Constitutional: Denies fevers, chills or abnormal weight loss Eyes: Denies blurriness of vision Ears, nose, mouth, throat, and face: Denies mucositis or sore throat Respiratory: Denies cough, dyspnea or wheezes Cardiovascular: Denies palpitation, chest discomfort or lower extremity swelling Gastrointestinal:  Denies nausea, heartburn or change in bowel habits Skin: Denies abnormal skin rashes Lymphatics: Denies new lymphadenopathy or easy bruising Neurological:Denies numbness, tingling or new weaknesses Behavioral/Psych: Mood is stable, no new changes  All other systems were reviewed with the patient and are negative.  MEDICAL HISTORY:  Past Medical History:  Diagnosis Date   AKI (acute kidney injury) ([place])    Alcohol abuse    Arthritis    Cataract 2006   Diarrhea    Edema leg    Esophageal cancer ([place])    Family history of breast cancer 12/09/2020   Family history of ovarian cancer 12/09/2020   Family history of prostate cancer 12/09/2020   GERD (gastroesophageal reflux disease)    Glaucoma unknown   Hyperbilirubinemia    Hyperlipemia    Hypertension    Hypokalemia    Liver failure ([facility])    Macular degeneration, wet ([facility])    Microcytic anemia    Osteoarthritis    SOB (shortness of breath)    Weight gain     SURGICAL HISTORY:  Past Surgical History:  Procedure Laterality Date   ANKLE SURGERY     BIOPSY  11/11/2020   Procedure: BIOPSY;  Surgeon: [name], MD;  Location: [facility];  Service: Gastroenterology;;   CATARACT EXTRACTION, BILATERAL     ESOPHAGOGASTRODUODENOSCOPY (EGD) WITH PROPOFOL N/A  11/11/2020   Procedure: ESOPHAGOGASTRODUODENOSCOPY (EGD) WITH PROPOFOL;  Surgeon: [name], MD;  Location: [facility];  Service: Gastroenterology;  Laterality: N/A;   EYE SURGERY  2006   FOREIGN BODY REMOVAL  11/11/2020   Procedure: FOREIGN BODY REMOVAL;  Surgeon: [name], MD;  Location: [facility];  Service: Gastroenterology;;   IR IMAGING GUIDED PORT INSERTION  07/16/2021   IR PARACENTESIS  05/09/2020   TONSILLECTOMY      I have reviewed the social history and family history with the patient and they are unchanged from previous note.  ALLERGIES:  is allergic to lisinopril.  MEDICATIONS:  Current Outpatient Medications  Medication Sig Dispense Refill   Azelastine HCl 137 MCG/SPRAY SOLN INSTILL 2 SPRAYS INTO EACH NOSTRIL TWICE A DAY AS DIRECTED 30 mL 1   cetirizine (ZYRTEC) 10 MG tablet Take 10 mg by mouth daily.     dorzolamide-timolol (COSOPT) 22.3-6.8 MG/ML ophthalmic solution Place 1 drop into the left eye 2 (two) times daily.     EPINEPHrine 0.3 mg/0.3 mL IJ SOAJ injection Inject 0.3 mg into the muscle as needed for anaphylaxis.     ezetimibe (ZETIA) 10 MG tablet Take 1 tablet (10 mg total) by mouth daily. 90 tablet 3   Ferrous Sulfate (IRON SLOW RELEASE) 142 (45 Fe) MG TBCR Take 1 tablet by mouth daily. 30 tablet 3   GLUCOSAMINE-CHONDROITIN PO Take 1 capsule by mouth daily.     lactulose (CHRONULAC) 10 GM/15ML solution Take 15 mLs (10 g total) by mouth daily as needed for mild constipation. 236 mL 0   latanoprost (XALATAN) 0.005 % ophthalmic solution Place 1 drop into the left eye at bedtime.     levothyroxine (SYNTHROID) 88 MCG tablet Take 1 tablet (88 mcg total) by mouth daily. 90 tablet 1   lidocaine-prilocaine (EMLA) cream Apply 1 application. topically as needed. 30 g 0   moxifloxacin (VIGAMOX) 0.5 % ophthalmic solution      Multiple Vitamins-Minerals (ONE-A-DAY MENS 50+) TABS Take 1 tablet by mouth daily.     Multiple Vitamins-Minerals (PRESERVISION AREDS 2 PO)  Take 1 tablet by mouth 2 (two) times daily.     ondansetron (ZOFRAN) 8 MG tablet Take 1 tablet (8 mg total) by mouth every 8 (eight) hours as needed for nausea or vomiting. 30 tablet 2   prochlorperazine (COMPAZINE) 10 MG tablet Take 1 tablet (10 mg total) by mouth every 6 (six) hours as needed. 30 tablet 2   spironolactone (ALDACTONE) 50 MG tablet TAKE ONE TABLET BY MOUTH DAILY 90 tablet 0   No current facility-administered medications for this visit.    PHYSICAL EXAMINATION: ECOG PERFORMANCE STATUS: {CHL ONC ECOG PS:[id number]}  There were no vitals filed for this visit. There were no vitals filed for this visit.  GENERAL:alert, no distress and comfortable SKIN: skin color, texture, turgor are normal, no rashes or significant lesions EYES: normal, Conjunctiva are pink and non-injected, sclera clear OROPHARYNX:no exudate, no erythema and lips, buccal mucosa, and tongue normal  NECK: supple, thyroid normal size, non-tender, without nodularity LYMPH:  no palpable lymphadenopathy in the cervical, axillary or inguinal LUNGS: clear to auscultation and percussion with normal breathing effort HEART: regular rate & rhythm and no murmurs and no  lower extremity edema ABDOMEN:abdomen soft, non-tender and normal bowel sounds Musculoskeletal:no cyanosis of digits and no clubbing  NEURO: alert & oriented x 3 with fluent speech, no focal motor/sensory deficits  LABORATORY DATA:  I have reviewed the data as listed    Latest Ref Rng & Units 10/23/2021    8:39 AM 10/09/2021    9:04 AM 09/23/2021    9:05 AM  CBC  WBC 4.0 - 10.5 K/uL 4.3  2.4  3.0   Hemoglobin 13.0 - 17.0 g/dL 10.7  10.2  10.2   Hematocrit 39.0 - 52.0 % 32.2  30.5  30.6   Platelets 150 - 400 K/uL 141  129  152         Latest Ref Rng & Units 10/23/2021    8:39 AM 10/09/2021    9:04 AM 09/23/2021    9:05 AM  CMP  Glucose 70 - 99 mg/dL 101  178  151   BUN 8 - 23 mg/dL $Remove'16  15  21   'hLkuClN$ Creatinine 0.61 - 1.24 mg/dL 1.05  1.02  0.98    Sodium 135 - 145 mmol/L 139  139  137   Potassium 3.5 - 5.1 mmol/L 4.1  4.0  3.9   Chloride 98 - 111 mmol/L 112  112  112   CO2 22 - 32 mmol/L $RemoveB'20  19  20   'QCzmEBTF$ Calcium 8.9 - 10.3 mg/dL 8.3  8.6  8.4   Total Protein 6.5 - 8.1 g/dL 6.2  6.0  6.1   Total Bilirubin 0.3 - 1.2 mg/dL 0.7  0.7  0.7   Alkaline Phos 38 - 126 U/L 90  83  64   AST 15 - 41 U/L 33  32  27   ALT 0 - 44 U/L $Remo'20  23  17       'dFYjL$ RADIOGRAPHIC STUDIES: I have personally reviewed the radiological images as listed and agreed with the findings in the report. No results found.   ASSESSMENT & PLAN:  No problem-specific Assessment & Plan notes found for this encounter.   No orders of the defined types were placed in this encounter.  All questions were answered. The patient knows to call the clinic with any problems, questions or concerns. No barriers to learning was detected. I spent {CHL ONC TIME VISIT - VCBSW:[id number]} counseling the patient face to face. The total time spent in the appointment was {CHL ONC TIME VISIT - YKZLD:[id number]} and more than 50% was on counseling and review of test results     [name], NP 11/03/21

## 2021-11-04 ENCOUNTER — Ambulatory Visit: Payer: Medicare Other | Admitting: Nurse Practitioner

## 2021-11-04 ENCOUNTER — Other Ambulatory Visit: Payer: Medicare Other

## 2021-11-04 ENCOUNTER — Ambulatory Visit: Payer: Medicare Other

## 2021-11-05 MED FILL — Dexamethasone Sodium Phosphate Inj 100 MG/10ML: INTRAMUSCULAR | Qty: 1 | Status: AC

## 2021-11-06 ENCOUNTER — Inpatient Hospital Stay (HOSPITAL_BASED_OUTPATIENT_CLINIC_OR_DEPARTMENT_OTHER): Payer: Medicare Other | Admitting: Nurse Practitioner

## 2021-11-06 ENCOUNTER — Inpatient Hospital Stay: Payer: Medicare Other

## 2021-11-06 ENCOUNTER — Inpatient Hospital Stay: Payer: Medicare Other | Attending: Nurse Practitioner

## 2021-11-06 ENCOUNTER — Encounter: Payer: Self-pay | Admitting: Nurse Practitioner

## 2021-11-06 ENCOUNTER — Other Ambulatory Visit: Payer: Self-pay

## 2021-11-06 VITALS — BP 116/63 | HR 71 | Temp 97.7°F | Resp 16 | Wt 176.2 lb

## 2021-11-06 DIAGNOSIS — Z5111 Encounter for antineoplastic chemotherapy: Secondary | ICD-10-CM | POA: Insufficient documentation

## 2021-11-06 DIAGNOSIS — E876 Hypokalemia: Secondary | ICD-10-CM

## 2021-11-06 DIAGNOSIS — C155 Malignant neoplasm of lower third of esophagus: Secondary | ICD-10-CM

## 2021-11-06 DIAGNOSIS — E0590 Thyrotoxicosis, unspecified without thyrotoxic crisis or storm: Secondary | ICD-10-CM | POA: Insufficient documentation

## 2021-11-06 DIAGNOSIS — Z95828 Presence of other vascular implants and grafts: Secondary | ICD-10-CM

## 2021-11-06 DIAGNOSIS — Z79899 Other long term (current) drug therapy: Secondary | ICD-10-CM | POA: Diagnosis not present

## 2021-11-06 DIAGNOSIS — C787 Secondary malignant neoplasm of liver and intrahepatic bile duct: Secondary | ICD-10-CM | POA: Diagnosis not present

## 2021-11-06 DIAGNOSIS — Z79890 Hormone replacement therapy (postmenopausal): Secondary | ICD-10-CM | POA: Insufficient documentation

## 2021-11-06 DIAGNOSIS — K7460 Unspecified cirrhosis of liver: Secondary | ICD-10-CM | POA: Insufficient documentation

## 2021-11-06 DIAGNOSIS — D649 Anemia, unspecified: Secondary | ICD-10-CM | POA: Diagnosis not present

## 2021-11-06 LAB — CBC WITH DIFFERENTIAL/PLATELET
Abs Immature Granulocytes: 0.01 10*3/uL (ref 0.00–0.07)
Basophils Absolute: 0.1 10*3/uL (ref 0.0–0.1)
Basophils Relative: 3 %
Eosinophils Absolute: 0.1 10*3/uL (ref 0.0–0.5)
Eosinophils Relative: 3 %
HCT: 31.2 % — ABNORMAL LOW (ref 39.0–52.0)
Hemoglobin: 10.4 g/dL — ABNORMAL LOW (ref 13.0–17.0)
Immature Granulocytes: 1 %
Lymphocytes Relative: 42 %
Lymphs Abs: 0.8 10*3/uL (ref 0.7–4.0)
MCH: 30.7 pg (ref 26.0–34.0)
MCHC: 33.3 g/dL (ref 30.0–36.0)
MCV: 92 fL (ref 80.0–100.0)
Monocytes Absolute: 0.6 10*3/uL (ref 0.1–1.0)
Monocytes Relative: 32 %
Neutro Abs: 0.4 10*3/uL — CL (ref 1.7–7.7)
Neutrophils Relative %: 19 %
Platelets: 160 10*3/uL (ref 150–400)
RBC: 3.39 MIL/uL — ABNORMAL LOW (ref 4.22–5.81)
RDW: 15.1 % (ref 11.5–15.5)
WBC: 1.9 10*3/uL — ABNORMAL LOW (ref 4.0–10.5)
nRBC: 0 % (ref 0.0–0.2)

## 2021-11-06 LAB — IRON AND IRON BINDING CAPACITY (CC-WL,HP ONLY)
Iron: 148 ug/dL (ref 45–182)
Saturation Ratios: 52 % — ABNORMAL HIGH (ref 17.9–39.5)
TIBC: 283 ug/dL (ref 250–450)
UIBC: 135 ug/dL (ref 117–376)

## 2021-11-06 LAB — COMPREHENSIVE METABOLIC PANEL
ALT: 16 U/L (ref 0–44)
AST: 29 U/L (ref 15–41)
Albumin: 3.4 g/dL — ABNORMAL LOW (ref 3.5–5.0)
Alkaline Phosphatase: 83 U/L (ref 38–126)
Anion gap: 7 (ref 5–15)
BUN: 14 mg/dL (ref 8–23)
CO2: 19 mmol/L — ABNORMAL LOW (ref 22–32)
Calcium: 8.2 mg/dL — ABNORMAL LOW (ref 8.9–10.3)
Chloride: 110 mmol/L (ref 98–111)
Creatinine, Ser: 1.12 mg/dL (ref 0.61–1.24)
GFR, Estimated: >60 mL/min (ref >60)
Glucose, Bld: 111 mg/dL — ABNORMAL HIGH (ref 70–99)
Potassium: 4.1 mmol/L (ref 3.5–5.1)
Sodium: 136 mmol/L (ref 135–145)
Total Bilirubin: 0.6 mg/dL (ref 0.3–1.2)
Total Protein: 6.2 g/dL — ABNORMAL LOW (ref 6.5–8.1)

## 2021-11-06 LAB — TOTAL PROTEIN, URINE DIPSTICK: Protein, ur: <30 mg/dL — AB

## 2021-11-06 LAB — FERRITIN: Ferritin: 49 ng/mL (ref 24–336)

## 2021-11-06 MED ORDER — SODIUM CHLORIDE 0.9% FLUSH
10.0000 mL | Freq: Once | INTRAVENOUS | Status: AC
  Administered 2021-11-06: 10 mL

## 2021-11-06 MED ORDER — FILGRASTIM-SNDZ 480 MCG/0.8ML IJ SOSY
480.0000 ug | PREFILLED_SYRINGE | Freq: Once | INTRAMUSCULAR | Status: AC
  Administered 2021-11-06: 480 ug via SUBCUTANEOUS

## 2021-11-06 MED ORDER — FILGRASTIM-SNDZ 480 MCG/0.8ML IJ SOSY
PREFILLED_SYRINGE | INTRAMUSCULAR | Status: DC
Start: 2021-11-06 — End: 2021-11-06
  Filled 2021-11-06: qty 0.8

## 2021-11-06 MED ORDER — HEPARIN SOD (PORK) LOCK FLUSH 100 UNIT/ML IV SOLN
500.0000 [IU] | Freq: Once | INTRAVENOUS | Status: AC
  Administered 2021-11-06: 500 [IU]

## 2021-11-06 NOTE — Patient Instructions (Signed)
Filgrastim Injection What is this medication? FILGRASTIM (fil GRA stim) lowers the risk of infection in people who are receiving chemotherapy. It works by helping your body make more white blood cells, which protects your body from infection. It may also be used to help people who have been exposed to high doses of radiation. It can be used to help prepare your body before a stem cell transplant. It works by helping your bone marrow make and release stem cells into the blood. This medicine may be used for other purposes; ask your health care provider or pharmacist if you have questions. COMMON BRAND NAME(S): Neupogen, Nivestym, Releuko, Zarxio What should I tell my care team before I take this medication? They need to know if you have any of these conditions: History of blood diseases, such as sickle cell anemia Kidney disease Recent or ongoing radiation An unusual or allergic reaction to filgrastim, pegfilgrastim, latex, rubber, other medications, foods, dyes, or preservatives Pregnant or trying to get pregnant Breast-feeding How should I use this medication? This medication is injected under the skin or into a vein. It is usually given by your care team in a hospital or clinic setting. It may be given at home. If you get this medication at home, you will be taught how to prepare and give it. Use exactly as directed. Take it as directed on the prescription label at the same time every day. Keep taking it unless your care team tells you to stop. It is important that you put your used needles and syringes in a special sharps container. Do not put them in a trash can. If you do not have a sharps container, call your pharmacist or care team to get one. This medication comes with INSTRUCTIONS FOR USE. Ask your pharmacist for directions on how to use this medication. Read the information carefully. Talk to your pharmacist or care team if you have questions. Talk to your care team about the use of this  medication in children. While it may be prescribed for children for selected conditions, precautions do apply. Overdosage: If you think you have taken too much of this medicine contact a poison control center or emergency room at once. NOTE: This medicine is only for you. Do not share this medicine with others. What if I miss a dose? It is important not to miss any doses. Talk to your care team about what to do if you miss a dose. What may interact with this medication? Medications that may cause a release of neutrophils, such as lithium This list may not describe all possible interactions. Give your health care provider a list of all the medicines, herbs, non-prescription drugs, or dietary supplements you use. Also tell them if you smoke, drink alcohol, or use illegal drugs. Some items may interact with your medicine. What should I watch for while using this medication? Your condition will be monitored carefully while you are receiving this medication. You may need bloodwork while taking this medication. Talk to your care team about your risk of cancer. You may be more at risk for certain types of cancer if you take this medication. What side effects may I notice from receiving this medication? Side effects that you should report to your care team as soon as possible: Allergic reactions--skin rash, itching, hives, swelling of the face, lips, tongue, or throat Capillary leak syndrome--stomach or muscle pain, unusual weakness or fatigue, feeling faint or lightheaded, decrease in the amount of urine, swelling of the ankles, hands, or   feet, trouble breathing High white blood cell level--fever, fatigue, trouble breathing, night sweats, change in vision, weight loss Inflammation of the aorta--fever, fatigue, back, chest, or stomach pain, severe headache Kidney injury (glomerulonephritis)--decrease in the amount of urine, red or dark brown urine, foamy or bubbly urine, swelling of the ankles, hands, or  feet Shortness of breath or trouble breathing Spleen injury--pain in upper left stomach or shoulder Unusual bruising or bleeding Side effects that usually do not require medical attention (report to your care team if they continue or are bothersome): Back pain Bone pain Fatigue Fever Headache Nausea This list may not describe all possible side effects. Call your doctor for medical advice about side effects. You may report side effects to FDA at 1-800-FDA-1088. Where should I keep my medication? Keep out of the reach of children and pets. Keep this medication in the original packaging until you are ready to take it. Protect from light. See product for storage information. Each product may have different instructions. Get rid of any unused medication after the expiration date. To get rid of medications that are no longer needed or have expired: Take the medication to a medications take-back program. Check with your pharmacy or law enforcement to find a location. If you cannot return the medication, ask your pharmacist or care team how to get rid of this medication safely. NOTE: This sheet is a summary. It may not cover all possible information. If you have questions about this medicine, talk to your doctor, pharmacist, or health care provider.  2023 Elsevier/Gold Standard (2021-06-24 00:00:00)  

## 2021-11-07 ENCOUNTER — Telehealth: Payer: Self-pay | Admitting: Hematology

## 2021-11-07 NOTE — Telephone Encounter (Signed)
Scheduled follow-up appointments per 8/10 los. Patient is aware. 

## 2021-11-17 ENCOUNTER — Encounter (INDEPENDENT_AMBULATORY_CARE_PROVIDER_SITE_OTHER): Payer: Medicare Other | Admitting: Ophthalmology

## 2021-11-17 DIAGNOSIS — I10 Essential (primary) hypertension: Secondary | ICD-10-CM

## 2021-11-17 DIAGNOSIS — H43813 Vitreous degeneration, bilateral: Secondary | ICD-10-CM | POA: Diagnosis not present

## 2021-11-17 DIAGNOSIS — H353211 Exudative age-related macular degeneration, right eye, with active choroidal neovascularization: Secondary | ICD-10-CM | POA: Diagnosis not present

## 2021-11-17 DIAGNOSIS — H353122 Nonexudative age-related macular degeneration, left eye, intermediate dry stage: Secondary | ICD-10-CM

## 2021-11-17 DIAGNOSIS — H35033 Hypertensive retinopathy, bilateral: Secondary | ICD-10-CM

## 2021-11-18 ENCOUNTER — Inpatient Hospital Stay: Payer: Medicare Other

## 2021-11-18 ENCOUNTER — Other Ambulatory Visit: Payer: Self-pay | Admitting: *Deleted

## 2021-11-18 ENCOUNTER — Other Ambulatory Visit: Payer: Self-pay

## 2021-11-18 NOTE — Patient Outreach (Signed)
  Care Coordination   11/18/2021 Name: Randy Wood MRN: [id number] DOB: August 31, 1940   Care Coordination Outreach Attempts:  An unsuccessful telephone outreach was attempted today to offer the patient information about available care coordination services as a benefit of their health plan.   Follow Up Plan:  Additional outreach attempts will be made to offer the patient care coordination information and services.   Encounter Outcome:  No Answer  Care Coordination Interventions Activated:  No   Care Coordination Interventions:  No, not indicated    SIG Lawana Hartzell C. Myrtie Neither, MSN, [facility] Ger[facility] Nurse Practitioner [facility] Care Management [phone]

## 2021-11-18 NOTE — Progress Notes (Signed)
Pt came in for injection today.  Per [name], NP last note, pt does not need injection prior to 8/24 treatment.  Pt verbalized understanding.

## 2021-11-19 ENCOUNTER — Ambulatory Visit: Payer: Medicare Other

## 2021-11-19 ENCOUNTER — Other Ambulatory Visit: Payer: Medicare Other

## 2021-11-19 ENCOUNTER — Ambulatory Visit: Payer: Medicare Other | Admitting: Hematology

## 2021-11-19 MED FILL — Dexamethasone Sodium Phosphate Inj 100 MG/10ML: INTRAMUSCULAR | Qty: 1 | Status: AC

## 2021-11-20 ENCOUNTER — Inpatient Hospital Stay: Payer: Medicare Other

## 2021-11-20 ENCOUNTER — Inpatient Hospital Stay (HOSPITAL_BASED_OUTPATIENT_CLINIC_OR_DEPARTMENT_OTHER): Payer: Medicare Other | Admitting: Hematology

## 2021-11-20 ENCOUNTER — Other Ambulatory Visit: Payer: Self-pay

## 2021-11-20 ENCOUNTER — Encounter: Payer: Self-pay | Admitting: Hematology

## 2021-11-20 VITALS — BP 121/63 | HR 74 | Temp 97.5°F | Resp 18 | Ht 71.0 in | Wt 173.1 lb

## 2021-11-20 VITALS — BP 121/61 | HR 69 | Resp 18

## 2021-11-20 DIAGNOSIS — C787 Secondary malignant neoplasm of liver and intrahepatic bile duct: Secondary | ICD-10-CM

## 2021-11-20 DIAGNOSIS — C159 Malignant neoplasm of esophagus, unspecified: Secondary | ICD-10-CM

## 2021-11-20 DIAGNOSIS — Z79890 Hormone replacement therapy (postmenopausal): Secondary | ICD-10-CM | POA: Diagnosis not present

## 2021-11-20 DIAGNOSIS — E0590 Thyrotoxicosis, unspecified without thyrotoxic crisis or storm: Secondary | ICD-10-CM | POA: Diagnosis not present

## 2021-11-20 DIAGNOSIS — Z5111 Encounter for antineoplastic chemotherapy: Secondary | ICD-10-CM | POA: Diagnosis not present

## 2021-11-20 DIAGNOSIS — C155 Malignant neoplasm of lower third of esophagus: Secondary | ICD-10-CM

## 2021-11-20 DIAGNOSIS — E876 Hypokalemia: Secondary | ICD-10-CM

## 2021-11-20 DIAGNOSIS — D649 Anemia, unspecified: Secondary | ICD-10-CM | POA: Diagnosis not present

## 2021-11-20 DIAGNOSIS — Z95828 Presence of other vascular implants and grafts: Secondary | ICD-10-CM

## 2021-11-20 LAB — FERRITIN: Ferritin: 42 ng/mL (ref 24–336)

## 2021-11-20 LAB — CBC WITH DIFFERENTIAL/PLATELET
Abs Immature Granulocytes: 0.02 10*3/uL (ref 0.00–0.07)
Basophils Absolute: 0.1 10*3/uL (ref 0.0–0.1)
Basophils Relative: 1 %
Eosinophils Absolute: 0.1 10*3/uL (ref 0.0–0.5)
Eosinophils Relative: 2 %
HCT: 30.4 % — ABNORMAL LOW (ref 39.0–52.0)
Hemoglobin: 10.1 g/dL — ABNORMAL LOW (ref 13.0–17.0)
Immature Granulocytes: 0 %
Lymphocytes Relative: 19 %
Lymphs Abs: 1.1 10*3/uL (ref 0.7–4.0)
MCH: 31.3 pg (ref 26.0–34.0)
MCHC: 33.2 g/dL (ref 30.0–36.0)
MCV: 94.1 fL (ref 80.0–100.0)
Monocytes Absolute: 0.5 10*3/uL (ref 0.1–1.0)
Monocytes Relative: 8 %
Neutro Abs: 4 10*3/uL (ref 1.7–7.7)
Neutrophils Relative %: 70 %
Platelets: 142 10*3/uL — ABNORMAL LOW (ref 150–400)
RBC: 3.23 MIL/uL — ABNORMAL LOW (ref 4.22–5.81)
RDW: 17.8 % — ABNORMAL HIGH (ref 11.5–15.5)
WBC: 5.7 10*3/uL (ref 4.0–10.5)
nRBC: 0 % (ref 0.0–0.2)

## 2021-11-20 LAB — COMPREHENSIVE METABOLIC PANEL
ALT: 23 U/L (ref 0–44)
AST: 37 U/L (ref 15–41)
Albumin: 3.4 g/dL — ABNORMAL LOW (ref 3.5–5.0)
Alkaline Phosphatase: 97 U/L (ref 38–126)
Anion gap: 5 (ref 5–15)
BUN: 18 mg/dL (ref 8–23)
CO2: 21 mmol/L — ABNORMAL LOW (ref 22–32)
Calcium: 8.9 mg/dL (ref 8.9–10.3)
Chloride: 112 mmol/L — ABNORMAL HIGH (ref 98–111)
Creatinine, Ser: 0.85 mg/dL (ref 0.61–1.24)
GFR, Estimated: >60 mL/min (ref >60)
Glucose, Bld: 106 mg/dL — ABNORMAL HIGH (ref 70–99)
Potassium: 4.3 mmol/L (ref 3.5–5.1)
Sodium: 138 mmol/L (ref 135–145)
Total Bilirubin: 0.7 mg/dL (ref 0.3–1.2)
Total Protein: 5.8 g/dL — ABNORMAL LOW (ref 6.5–8.1)

## 2021-11-20 LAB — TOTAL PROTEIN, URINE DIPSTICK: Protein, ur: <30 mg/dL

## 2021-11-20 LAB — IRON AND IRON BINDING CAPACITY (CC-WL,HP ONLY)
Iron: 185 ug/dL — ABNORMAL HIGH (ref 45–182)
Saturation Ratios: 63 % — ABNORMAL HIGH (ref 17.9–39.5)
TIBC: 295 ug/dL (ref 250–450)
UIBC: 110 ug/dL — ABNORMAL LOW (ref 117–376)

## 2021-11-20 MED ORDER — SODIUM CHLORIDE 0.9 % IV SOLN
10.0000 mg | Freq: Once | INTRAVENOUS | Status: AC
  Administered 2021-11-20: 10 mg via INTRAVENOUS
  Filled 2021-11-20: qty 1

## 2021-11-20 MED ORDER — DIPHENHYDRAMINE HCL 50 MG/ML IJ SOLN
25.0000 mg | Freq: Once | INTRAMUSCULAR | Status: AC
  Administered 2021-11-20: 25 mg via INTRAVENOUS
  Filled 2021-11-20: qty 1

## 2021-11-20 MED ORDER — SODIUM CHLORIDE 0.9 % IV SOLN: Freq: Once | INTRAVENOUS | Status: AC

## 2021-11-20 MED ORDER — SODIUM CHLORIDE 0.9 % IV SOLN
60.0000 mg/m2 | Freq: Once | INTRAVENOUS | Status: AC
  Administered 2021-11-20: 120 mg via INTRAVENOUS
  Filled 2021-11-20: qty 20

## 2021-11-20 MED ORDER — SODIUM CHLORIDE 0.9% FLUSH
10.0000 mL | Freq: Once | INTRAVENOUS | Status: AC
  Administered 2021-11-20: 10 mL

## 2021-11-20 MED ORDER — SODIUM CHLORIDE 0.9% FLUSH
10.0000 mL | INTRAVENOUS | Status: DC | PRN
  Administered 2021-11-20: 10 mL

## 2021-11-20 MED ORDER — HEPARIN SOD (PORK) LOCK FLUSH 100 UNIT/ML IV SOLN
500.0000 [IU] | Freq: Once | INTRAVENOUS | Status: AC | PRN
  Administered 2021-11-20: 500 [IU]

## 2021-11-20 MED ORDER — SODIUM CHLORIDE 0.9 % IV SOLN
8.0000 mg/kg | Freq: Once | INTRAVENOUS | Status: AC
  Administered 2021-11-20: 600 mg via INTRAVENOUS
  Filled 2021-11-20: qty 50

## 2021-11-20 MED ORDER — FAMOTIDINE IN NACL 20-0.9 MG/50ML-% IV SOLN
20.0000 mg | Freq: Once | INTRAVENOUS | Status: AC
  Administered 2021-11-20: 20 mg via INTRAVENOUS
  Filled 2021-11-20: qty 50

## 2021-11-20 NOTE — Progress Notes (Signed)
Randy Wood   [phone] [phone]   Clinic Follow up Note   Patient Care Team: [name], [name], MD as PCP - General (Family Medicine) [name], MD as Consulting Physician (Gastroenterology) [name], MD as Consulting Physician (Hematology) [name], NP as Nurse Practitioner (Nurse Practitioner) [name], RN as Registered Nurse Moya, Carlisle Beers, MD (Ophthalmology)  Date of Service:  11/20/2021  CHIEF COMPLAINT: f/u of esophageal cancer  CURRENT THERAPY:  Taxol and ramucirumab, starting 07/17/21             -currently q14d  ASSESSMENT & PLAN:  Randy Wood is a 81 y.o. male with   1. 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 Molecular testing showed MMR normal, PD-L1 CPS 3%. -PET 12/03/20 showed: esophageal tumor with local perigastric infiltration; hepatic and nodal metastasis involving the chest and abdomen extending to lower retroperitoneum; burst fracture at L5 with increased metabolic activity -He received palliative radiation to the primary tumor 9/15 - 12/27/20 -he took Xeloda and nivolumab q14d 01/07/21 - 05/2021. He was switched to taxol 07/17/21 due to worsening nodal disease on CT. -restaging CT CAP on 10/21/21 showed treatment response, will continue current treatment. -treatment held two weeks ago due to neutropenia. Labs reviewed, hgb stable at 10.1, WBC improved to 5.7. Urine protein negative. Adequate to continue treatment.   2. Anemia -Patient's iron studies show low ferritin and low iron and saturation on 09/04/21. On oral iron.   3. Hyperthyroidism  -secondary to nivolumab -started on synthroid on  07/16/21.   4. Cirrhosis, abdominal pain -felt to be alcohol-related. He has abstained from alcohol since 04/2020 -Compensated, hepatic function panel 09/17/20 WNL -Continue spironolactone.  Hold Lasix -followed by GI     PLAN: -proceed with taxol and ramucizumab at same reduced dose today -lab, flush, f/u, and taxol and ramucizumab every 2 weeks with GCSF injection 2 days before chemo as scheduled   No problem-specific Assessment & Plan notes found for this encounter.   SUMMARY OF ONCOLOGIC HISTORY: Oncology History Overview Note  Cancer Staging No matching staging information was found for the patient.    Primary adenocarcinoma of distal third of esophagus ([place])  11/11/2020 Procedure   EGD by Dr. Watt Climes - Normal larynx. -? Malignant-appearing esophageal stenosis. Biopsied. - Pills were found in the esophagus. Removal was successful. - Rule out malignancy,? gastric tumor in the cardia difficult to say based on bleeding from passing the scope and unable to wash and suction for adequate visualization. - Normal ampulla, duodenal bulb, first portion of the duodenum, second portion of the duodenum, major papilla and area of the papilla. - The examination was otherwise normal.   11/11/2020 Initial Biopsy   FINAL MICROSCOPIC DIAGNOSIS:  A. ESOPHAGUS, DISTAL, BIOPSY:  -  Poorly differentiated adenocarcinoma  -  See comment    11/11/2020 Initial Diagnosis   Primary adenocarcinoma of distal third of esophagus ([place])   11/11/2020 Cancer Staging   Staging form: Esophagus - Adenocarcinoma, AJCC 8th Edition - Clinical stage from 11/11/2020: Stage IVB (cTX, cN1, cM1) - Signed by [name], MD on 12/26/2020   11/14/2020 Imaging   CT CAPIMPRESSION: 1. Aggressive appearing new tumor of the gastroesophageal junction confluent with the irregular collection of  right gastric lymph nodes, and with new associated metastatic retroperitoneal adenopathy. Small paraesophageal lymph nodes in the thorax  are increased in size from prior exam and could also be involved, and there is a nonspecific 1.0 cm lesion inferiorly in the right hepatic lobe which could potentially be metastatic. 2. New subacute burst (AO type A4) fracture of L5, with mild posterior bony retropulsion and with a dominant coronally oriented fracture plane, and about 50% loss of height. Correlate with interval trauma. 3. Slight thickening along the left paracolic gutter and trace pelvic ascites, although the ascites is markedly reduced compared to 05/04/2020. 4. Other imaging findings of potential clinical significance: Aortic Atherosclerosis (ICD10-I70.0). Coronary atherosclerosis. Old compression fracture at L2.   12/03/2020 PET scan   IMPRESSION: Tumor at the GE junction with local perigastric infiltration, hepatic and nodal metastases.   Nodal involvement both in the chest and abdomen extending to the lower retroperitoneum in the abdomen.   Burst fracture at L5 shows signs of increased metabolic activity, nonspecific in the setting of fracture, in light of other findings would correlate with any history of trauma, if no history of trauma consider MRI to exclude the possibility of pathologic fracture.   12/27/2020 Genetic Testing   Negative hereditary cancer genetic testing: no pathogenic variants detected in [facility] Multi-Cancer +RNA Panel.  The report date is December 27, 2020.   The Multi-Cancer + RNA Panel offered by [facility] includes sequencing and/or deletion/duplication analysis of the following 84 genes:  AIP*, ALK, APC*, ATM*, AXIN2*, BAP1*, BARD1*, BLM*, BMPR1A*, BRCA1*, BRCA2*, BRIP1*, CASR, CDC73*, CDH1*, CDK4, CDKN1B*, CDKN1C*, CDKN2A, CEBPA, CHEK2*, CTNNA1*, DICER1*, DIS3L2*, EGFR, EPCAM, FH*, FLCN*, GATA2*, GPC3, GREM1, HOXB13, HRAS, KIT, MAX*, MEN1*, MET, MITF, MLH1*, MSH2*, MSH3*, MSH6*, MUTYH*, NBN*, NF1*, NF2*, NTHL1*, PALB2*, PDGFRA, PHOX2B, PMS2*, POLD1*, POLE*, POT1*, PRKAR1A*, PTCH1*, PTEN*,  RAD50*, RAD51C*, RAD51D*, RB1*, RECQL4, RET, RUNX1*, SDHA*, SDHAF2*, SDHB*, SDHC*, SDHD*, SMAD4*, SMARCA4*, SMARCB1*, SMARCE1*, STK11*, SUFU*, TERC, TERT, TMEM127*, Tp53*, TSC1*, TSC2*, VHL*, WRN*, and WT1.  RNA analysis is performed for * genes.   01/07/2021 - 05/07/2021 Chemotherapy   Patient is on Treatment Plan : GASTROESOPHAGEAL Nivolumab q14d x 8 cycles / Nivolumab q28d     03/26/2021 Imaging   CT CAP IMPRESSION: 1. Evidence of treatment response. There is decreased size of the irregular infiltrative mass centered at the gastroesophageal junction as well as decreased size of numerous metastatic retroperitoneal and upper abdominal lymph nodes. 2. 2.1 cm hepatic metastasis at the inferior right hepatic lobe segment 6 is stable to slightly increased in size since previous PET-CT. No new hepatic mass visualized. 3. A few new adjacent small pulmonary nodules in the posterior left lower lobe measuring up to 4 mm in size. Continued follow-up recommended. 4. Small right pleural effusion which is new since previous study. Small pericardial effusion increased since previous study. 5. Other ancillary findings as described.     06/30/2021 Imaging   EXAM: CT CHEST, ABDOMEN, AND PELVIS WITH CONTRAST  IMPRESSION: 1. Worsening nodal disease in the chest and abdomen more so within the abdomen as described. 2. Increasing size of RIGHT pleural fluid and pericardial effusion. Now moderate. 3. Increasing size of RIGHT hepatic lesion. 4. New LEFT adrenal metastasis. 5. Increasing size of retroperitoneal adenopathy. 6. Obstruction of the RIGHT ureter may be related to a metastatic lesion to the RIGHT ureter, blood clot or primary urothelial lesion and is associated with mild hydronephrosis, moderate ureteral dilation and potential filling defect also in lower pole calyces of the RIGHT  kidney. 7. Trace perihepatic ascites. 8. Aortic atherosclerosis.   07/17/2021 - 10/23/2021 Chemotherapy   Patient is on  Treatment Plan : GASTROESOPHAGEAL Ramucirumab D1, 15  / PACLitaxel D1,8,15 q28d     10/09/2021 -  Chemotherapy   Patient is on Treatment Plan : GASTROESOPHAGEAL Ramucirumab D1, 15 + Paclitaxel D1,8,15 q28d        INTERVAL HISTORY:  Randy Wood is here for a follow up of esophageal cancer. He was last seen by NP Lacie on 11/06/21. He presents to the clinic accompanied by his daughter. He reports he is doing well overall, improved with break.   All other systems were reviewed with the patient and are negative.  MEDICAL HISTORY:  Past Medical History:  Diagnosis Date   AKI (acute kidney injury) ([place])    Alcohol abuse    Arthritis    Cataract 2006   Diarrhea    Edema leg    Esophageal cancer ([place])    Family history of breast cancer 12/09/2020   Family history of ovarian cancer 12/09/2020   Family history of prostate cancer 12/09/2020   GERD (gastroesophageal reflux disease)    Glaucoma unknown   Hyperbilirubinemia    Hyperlipemia    Hypertension    Hypokalemia    Liver failure ([facility])    Macular degeneration, wet ([facility])    Microcytic anemia    Osteoarthritis    SOB (shortness of breath)    Weight gain     SURGICAL HISTORY: Past Surgical History:  Procedure Laterality Date   ANKLE SURGERY     BIOPSY  11/11/2020   Procedure: BIOPSY;  Surgeon: [name], MD;  Location: [facility];  Service: Gastroenterology;;   CATARACT EXTRACTION, BILATERAL     ESOPHAGOGASTRODUODENOSCOPY (EGD) WITH PROPOFOL N/A 11/11/2020   Procedure: ESOPHAGOGASTRODUODENOSCOPY (EGD) WITH PROPOFOL;  Surgeon: [name], MD;  Location: [facility];  Service: Gastroenterology;  Laterality: N/A;   EYE SURGERY  2006   FOREIGN BODY REMOVAL  11/11/2020   Procedure: FOREIGN BODY REMOVAL;  Surgeon: [name], MD;  Location: [facility];  Service: Gastroenterology;;   IR IMAGING GUIDED PORT INSERTION  07/16/2021   IR PARACENTESIS  05/09/2020   TONSILLECTOMY      I have reviewed the social history and  family history with the patient and they are unchanged from previous note.  ALLERGIES:  is allergic to lisinopril.  MEDICATIONS:  Current Outpatient Medications  Medication Sig Dispense Refill   Azelastine HCl 137 MCG/SPRAY SOLN INSTILL 2 SPRAYS INTO EACH NOSTRIL TWICE A DAY AS DIRECTED 30 mL 1   cetirizine (ZYRTEC) 10 MG tablet Take 10 mg by mouth daily.     dorzolamide-timolol (COSOPT) 22.3-6.8 MG/ML ophthalmic solution Place 1 drop into the left eye 2 (two) times daily.     EPINEPHrine 0.3 mg/0.3 mL IJ SOAJ injection Inject 0.3 mg into the muscle as needed for anaphylaxis.     ezetimibe (ZETIA) 10 MG tablet Take 1 tablet (10 mg total) by mouth daily. 90 tablet 3   Ferrous Sulfate (IRON SLOW RELEASE) 142 (45 Fe) MG TBCR Take 1 tablet by mouth daily. 30 tablet 3   GLUCOSAMINE-CHONDROITIN PO Take 1 capsule by mouth daily.     lactulose (CHRONULAC) 10 GM/15ML solution Take 15 mLs (10 g total) by mouth daily as needed for mild constipation. 236 mL 0   latanoprost (XALATAN) 0.005 % ophthalmic solution Place 1 drop into the left eye at bedtime.     levothyroxine (SYNTHROID) 88 MCG tablet Take 1  tablet (88 mcg total) by mouth daily. 90 tablet 1   lidocaine-prilocaine (EMLA) cream Apply 1 application. topically as needed. 30 g 0   moxifloxacin (VIGAMOX) 0.5 % ophthalmic solution      Multiple Vitamins-Minerals (ONE-A-DAY MENS 50+) TABS Take 1 tablet by mouth daily.     Multiple Vitamins-Minerals (PRESERVISION AREDS 2 PO) Take 1 tablet by mouth 2 (two) times daily.     ondansetron (ZOFRAN) 8 MG tablet Take 1 tablet (8 mg total) by mouth every 8 (eight) hours as needed for nausea or vomiting. 30 tablet 2   prochlorperazine (COMPAZINE) 10 MG tablet Take 1 tablet (10 mg total) by mouth every 6 (six) hours as needed. 30 tablet 2   spironolactone (ALDACTONE) 50 MG tablet TAKE ONE TABLET BY MOUTH DAILY 90 tablet 0   No current facility-administered medications for this visit.   Facility-Administered  Medications Ordered in Other Visits  Medication Dose Route Frequency Provider Last Rate Last Admin   sodium chloride flush (NS) 0.9 % injection 10 mL  10 mL Intracatheter PRN [name], MD   10 mL at 11/20/21 1400    PHYSICAL EXAMINATION: ECOG PERFORMANCE STATUS: 2 - Symptomatic, <50% confined to bed  Vitals:   11/20/21 1019  BP: 121/63  Pulse: 74  Resp: 18  Temp: (!) 97.5 F (36.4 C)  SpO2: 100%   Wt Readings from Last 3 Encounters:  11/20/21 173 lb 1.6 oz (78.5 kg)  11/06/21 176 lb 4 oz (79.9 kg)  10/23/21 178 lb 9.6 oz (81 kg)     GENERAL:alert, no distress and comfortable SKIN: skin color normal, no rashes or significant lesions EYES: normal, Conjunctiva are pink and non-injected, sclera clear  NEURO: alert & oriented x 3 with fluent speech  LABORATORY DATA:  I have reviewed the data as listed    Latest Ref Rng & Units 11/20/2021   10:01 AM 11/06/2021    8:52 AM 10/23/2021    8:39 AM  CBC  WBC 4.0 - 10.5 K/uL 5.7  1.9  4.3   Hemoglobin 13.0 - 17.0 g/dL 10.1  10.4  10.7   Hematocrit 39.0 - 52.0 % 30.4  31.2  32.2   Platelets 150 - 400 K/uL 142  160  141         Latest Ref Rng & Units 11/20/2021   10:01 AM 11/06/2021    8:52 AM 10/23/2021    8:39 AM  CMP  Glucose 70 - 99 mg/dL 106  111  101   BUN 8 - 23 mg/dL _0 Creatinine 0.61 - 1.24 mg/dL 0.85  1.12  1.05   Sodium 135 - 145 mmol/L 138  136  139   Potassium 3.5 - 5.1 mmol/L 4.3  4.1  4.1   Chloride 98 - 111 mmol/L 112  110  112   CO2 22 - 32 mmol/L _1 Calcium 8.9 - 10.3 mg/dL 8.9  8.2  8.3   Total Protein 6.5 - 8.1 g/dL 5.8  6.2  6.2   Total Bilirubin 0.3 - 1.2 mg/dL 0.7  0.6  0.7   Alkaline Phos 38 - 126 U/L 97  83  90   AST 15 - 41 U/L 37  29  33   ALT 0 - 44 U/L _2 RADIOGRAPHIC STUDIES: I have personally reviewed the radiological images as listed and agreed with the findings in the  report. No results found.    Orders Placed This Encounter  Procedures   CBC with  Differential ([facility] Only)    Standing Status:   Future    Standing Expiration Date:   11/21/2022   CMP ([facility] only)    Standing Status:   Future    Standing Expiration Date:   11/21/2022   CBC with Differential ([facility] Only)    Standing Status:   Future    Standing Expiration Date:   12/05/2022   CMP ([facility] only)    Standing Status:   Future    Standing Expiration Date:   12/05/2022   CBC with Differential ([facility] Only)    Standing Status:   Future    Standing Expiration Date:   12/19/2022   CMP ([facility] only)    Standing Status:   Future    Standing Expiration Date:   12/19/2022   T4    Standing Status:   Future    Standing Expiration Date:   12/19/2022   TSH    Standing Status:   Future    Standing Expiration Date:   12/19/2022   Total Protein, Urine dipstick    Standing Status:   Future    Standing Expiration Date:   12/19/2022   CBC with Differential ([facility] Only)    Standing Status:   Future    Standing Expiration Date:   01/02/2023   CMP ([facility] only)    Standing Status:   Future    Standing Expiration Date:   01/02/2023   All questions were answered. The patient knows to call the clinic with any problems, questions or concerns. No barriers to learning was detected. The total time spent in the appointment was 30 minutes.     [name], MD 11/20/2021   I, Wilburn Mylar, am acting as scribe for [name], MD.   I have reviewed the above documentation for accuracy and completeness, and I agree with the above.

## 2021-11-20 NOTE — Patient Instructions (Signed)
[facility] ONCOLOGY  Discharge Instructions: Thank you for choosing [facility] to provide your oncology and hematology care.   If you have a lab appointment with the [facility], please go directly to the [facility] and check in at the registration area.   Wear comfortable clothing and clothing appropriate for easy access to any Portacath or PICC line.   We strive to give you quality time with your provider. You may need to reschedule your appointment if you arrive late (15 or more minutes).  Arriving late affects you and other patients whose appointments are after yours.  Also, if you miss three or more appointments without notifying the office, you may be dismissed from the clinic at the provider's discretion.      For prescription refill requests, have your pharmacy contact our office and allow 72 hours for refills to be completed.    Today you received the following chemotherapy and/or immunotherapy agents: Cyramza and Paclitaxel       To help prevent nausea and vomiting after your treatment, we encourage you to take your nausea medication as directed.  BELOW ARE SYMPTOMS THAT SHOULD BE REPORTED IMMEDIATELY: *FEVER GREATER THAN 100.4 F (38 C) OR HIGHER *CHILLS OR SWEATING *NAUSEA AND VOMITING THAT IS NOT CONTROLLED WITH YOUR NAUSEA MEDICATION *UNUSUAL SHORTNESS OF BREATH *UNUSUAL BRUISING OR BLEEDING *URINARY PROBLEMS (pain or burning when urinating, or frequent urination) *BOWEL PROBLEMS (unusual diarrhea, constipation, pain near the anus) TENDERNESS IN MOUTH AND THROAT WITH OR WITHOUT PRESENCE OF ULCERS (sore throat, sores in mouth, or a toothache) UNUSUAL RASH, SWELLING OR PAIN  UNUSUAL VAGINAL DISCHARGE OR ITCHING   Items with * indicate a potential emergency and should be followed up as soon as possible or go to the Emergency Department if any problems should occur.  Please show the CHEMOTHERAPY ALERT CARD or IMMUNOTHERAPY ALERT CARD  at check-in to the Emergency Department and triage nurse.  Should you have questions after your visit or need to cancel or reschedule your appointment, please contact [facility]  Dept: [phone]  and follow the prompts.  Office hours are 8:00 a.m. to 4:30 p.m. Monday - Friday. Please note that voicemails left after 4:00 p.m. may not be returned until the following business day.  We are closed weekends and major holidays. You have access to a nurse at all times for urgent questions. Please call the main number to the clinic Dept: [phone] and follow the prompts.   For any non-urgent questions, you may also contact your provider using MyChart. We now offer e-Visits for anyone 14 and older to request care online for non-urgent symptoms. For details visit [email or web address].   Also download the MyChart app! Go to the app store, search "MyChart", open the app, select [facility], and log in with your MyChart username and password.  Masks are optional in the cancer centers. If you would like for your care team to wear a mask while they are taking care of you, please let them know. You may have one support person who is at least 82 years old accompany you for your appointments.

## 2021-11-25 ENCOUNTER — Telehealth: Payer: Self-pay | Admitting: Hematology

## 2021-11-25 NOTE — Telephone Encounter (Signed)
Scheduled follow-up appointments per 8/24 los. Patient's daughter is aware.

## 2021-12-02 ENCOUNTER — Inpatient Hospital Stay: Payer: Medicare Other

## 2021-12-02 ENCOUNTER — Other Ambulatory Visit: Payer: Self-pay

## 2021-12-02 ENCOUNTER — Inpatient Hospital Stay (HOSPITAL_BASED_OUTPATIENT_CLINIC_OR_DEPARTMENT_OTHER): Payer: Medicare Other | Admitting: Hematology

## 2021-12-02 ENCOUNTER — Inpatient Hospital Stay: Payer: Medicare Other | Attending: Nurse Practitioner

## 2021-12-02 VITALS — BP 97/56 | HR 66 | Resp 16 | Ht 71.0 in | Wt 172.7 lb

## 2021-12-02 DIAGNOSIS — C779 Secondary and unspecified malignant neoplasm of lymph node, unspecified: Secondary | ICD-10-CM | POA: Diagnosis not present

## 2021-12-02 DIAGNOSIS — Z79890 Hormone replacement therapy (postmenopausal): Secondary | ICD-10-CM | POA: Insufficient documentation

## 2021-12-02 DIAGNOSIS — C787 Secondary malignant neoplasm of liver and intrahepatic bile duct: Secondary | ICD-10-CM | POA: Diagnosis not present

## 2021-12-02 DIAGNOSIS — Z79899 Other long term (current) drug therapy: Secondary | ICD-10-CM | POA: Diagnosis not present

## 2021-12-02 DIAGNOSIS — Z5111 Encounter for antineoplastic chemotherapy: Secondary | ICD-10-CM | POA: Insufficient documentation

## 2021-12-02 DIAGNOSIS — D649 Anemia, unspecified: Secondary | ICD-10-CM | POA: Insufficient documentation

## 2021-12-02 DIAGNOSIS — E876 Hypokalemia: Secondary | ICD-10-CM

## 2021-12-02 DIAGNOSIS — E0590 Thyrotoxicosis, unspecified without thyrotoxic crisis or storm: Secondary | ICD-10-CM | POA: Insufficient documentation

## 2021-12-02 DIAGNOSIS — C155 Malignant neoplasm of lower third of esophagus: Secondary | ICD-10-CM | POA: Insufficient documentation

## 2021-12-02 DIAGNOSIS — Z95828 Presence of other vascular implants and grafts: Secondary | ICD-10-CM

## 2021-12-02 DIAGNOSIS — K7460 Unspecified cirrhosis of liver: Secondary | ICD-10-CM | POA: Diagnosis not present

## 2021-12-02 LAB — CBC WITH DIFFERENTIAL/PLATELET
Abs Immature Granulocytes: 0.01 10*3/uL (ref 0.00–0.07)
Basophils Absolute: 0 10*3/uL (ref 0.0–0.1)
Basophils Relative: 1 %
Eosinophils Absolute: 0.2 10*3/uL (ref 0.0–0.5)
Eosinophils Relative: 6 %
HCT: 24.3 % — ABNORMAL LOW (ref 39.0–52.0)
Hemoglobin: 8.1 g/dL — ABNORMAL LOW (ref 13.0–17.0)
Immature Granulocytes: 0 %
Lymphocytes Relative: 40 %
Lymphs Abs: 1 10*3/uL (ref 0.7–4.0)
MCH: 31.2 pg (ref 26.0–34.0)
MCHC: 33.3 g/dL (ref 30.0–36.0)
MCV: 93.5 fL (ref 80.0–100.0)
Monocytes Absolute: 0.4 10*3/uL (ref 0.1–1.0)
Monocytes Relative: 16 %
Neutro Abs: 1 10*3/uL — ABNORMAL LOW (ref 1.7–7.7)
Neutrophils Relative %: 37 %
Platelets: 168 10*3/uL (ref 150–400)
RBC: 2.6 MIL/uL — ABNORMAL LOW (ref 4.22–5.81)
RDW: 17.9 % — ABNORMAL HIGH (ref 11.5–15.5)
WBC: 2.6 10*3/uL — ABNORMAL LOW (ref 4.0–10.5)
nRBC: 0 % (ref 0.0–0.2)

## 2021-12-02 LAB — COMPREHENSIVE METABOLIC PANEL
ALT: 25 U/L (ref 0–44)
AST: 39 U/L (ref 15–41)
Albumin: 3.2 g/dL — ABNORMAL LOW (ref 3.5–5.0)
Alkaline Phosphatase: 85 U/L (ref 38–126)
Anion gap: 5 (ref 5–15)
BUN: 17 mg/dL (ref 8–23)
CO2: 20 mmol/L — ABNORMAL LOW (ref 22–32)
Calcium: 8.4 mg/dL — ABNORMAL LOW (ref 8.9–10.3)
Chloride: 110 mmol/L (ref 98–111)
Creatinine, Ser: 1.01 mg/dL (ref 0.61–1.24)
GFR, Estimated: >60 mL/min (ref >60)
Glucose, Bld: 107 mg/dL — ABNORMAL HIGH (ref 70–99)
Potassium: 4.1 mmol/L (ref 3.5–5.1)
Sodium: 135 mmol/L (ref 135–145)
Total Bilirubin: 0.8 mg/dL (ref 0.3–1.2)
Total Protein: 5.5 g/dL — ABNORMAL LOW (ref 6.5–8.1)

## 2021-12-02 LAB — TOTAL PROTEIN, URINE DIPSTICK: Protein, ur: NEGATIVE mg/dL

## 2021-12-02 LAB — IRON AND IRON BINDING CAPACITY (CC-WL,HP ONLY)
Iron: 213 ug/dL — ABNORMAL HIGH (ref 45–182)
Saturation Ratios: 75 % — ABNORMAL HIGH (ref 17.9–39.5)
TIBC: 286 ug/dL (ref 250–450)
UIBC: 73 ug/dL — ABNORMAL LOW (ref 117–376)

## 2021-12-02 LAB — FERRITIN: Ferritin: 38 ng/mL (ref 24–336)

## 2021-12-02 MED ORDER — FILGRASTIM-SNDZ 480 MCG/0.8ML IJ SOSY
480.0000 ug | PREFILLED_SYRINGE | Freq: Once | INTRAMUSCULAR | Status: AC
  Administered 2021-12-02: 480 ug via SUBCUTANEOUS
  Filled 2021-12-02: qty 0.8

## 2021-12-02 MED ORDER — HEPARIN SOD (PORK) LOCK FLUSH 100 UNIT/ML IV SOLN
500.0000 [IU] | Freq: Once | INTRAVENOUS | Status: AC
  Administered 2021-12-02: 500 [IU]

## 2021-12-02 MED ORDER — SODIUM CHLORIDE 0.9% FLUSH
10.0000 mL | Freq: Once | INTRAVENOUS | Status: AC
  Administered 2021-12-02: 10 mL

## 2021-12-02 NOTE — Progress Notes (Signed)
[place]   [phone] [phone]   Clinic Follow up Note   Patient Care Team: [name], [name], MD as PCP - General (Family Medicine) [name], MD as Consulting Physician (Gastroenterology) [name], MD as Consulting Physician (Hematology) [name], NP as Nurse Practitioner (Nurse Practitioner) [name], RN as Registered Nurse Moya, Carlisle Beers, MD (Ophthalmology)  Date of Service:  12/02/2021  CHIEF COMPLAINT: f/u of esophageal cancer  CURRENT THERAPY:  Taxol and ramucirumab, starting 07/17/21             -currently q14d  ASSESSMENT & PLAN:  Randy Wood is a 81 y.o. male with   1. 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 Molecular testing showed MMR normal, PD-L1 CPS 3%. -PET 12/03/20 showed: esophageal tumor with local perigastric infiltration; hepatic and nodal metastasis involving the chest and abdomen extending to lower retroperitoneum; burst fracture at L5 with increased metabolic activity -He received palliative radiation to the primary tumor 9/15 - 12/27/20 -he took Xeloda and nivolumab q14d 01/07/21 - 05/2021. He was switched to taxol 07/17/21 due to worsening nodal disease on CT. -restaging CT CAP on 10/21/21 showed treatment response, will continue current treatment. He is tolerating well  -labs reviewed, hgb down to 8.1, WBC 2.6/ANC 1. Urine protein negative. Adequate to continue treatment.   2. Anemia -Patient's iron studies show low ferritin and low iron and saturation on 09/04/21. On oral iron. -hgb dropped to 8.1 today (12/02/21)   3. Hyperthyroidism  -secondary to nivolumab -started on synthroid on  07/16/21.   4. Cirrhosis, abdominal pain -felt to be alcohol-related. He has abstained from alcohol since 04/2020 -Compensated, hepatic function panel 09/17/20 WNL -Continue spironolactone.  Hold Lasix -followed by GI     PLAN: -proceed with taxol and ramucizumab at same reduced dose on 9/7 -lab, flush, f/u, and taxol and ramucizumab every 2 weeks with GCSF injection 2 days before chemo as scheduled   No problem-specific Assessment & Plan notes found for this encounter.   SUMMARY OF ONCOLOGIC HISTORY: Oncology History Overview Note  Cancer Staging No matching staging information was found for the patient.    Primary adenocarcinoma of distal third of esophagus ([place])  11/11/2020 Procedure   EGD by Dr. Watt Climes - Normal larynx. -? Malignant-appearing esophageal stenosis. Biopsied. - Pills were found in the esophagus. Removal was successful. - Rule out malignancy,? gastric tumor in the cardia difficult to say based on bleeding from passing the scope and unable to wash and suction for adequate visualization. - Normal ampulla, duodenal bulb, first portion of the duodenum, second portion of the duodenum, major papilla and area of the papilla. - The examination was otherwise normal.   11/11/2020 Initial Biopsy   FINAL MICROSCOPIC DIAGNOSIS:  A. ESOPHAGUS, DISTAL, BIOPSY:  -  Poorly differentiated adenocarcinoma  -  See comment    11/11/2020 Initial Diagnosis   Primary adenocarcinoma of distal third of esophagus ([place])   11/11/2020 Cancer Staging   Staging form: Esophagus - Adenocarcinoma, AJCC 8th Edition - Clinical stage from 11/11/2020: Stage IVB (cTX, cN1, cM1) - Signed by [name], MD on 12/26/2020   11/14/2020 Imaging   CT CAPIMPRESSION: 1. Aggressive appearing new tumor of the gastroesophageal junction confluent with the  irregular collection of right gastric lymph nodes, and with new associated metastatic retroperitoneal adenopathy. Small paraesophageal lymph nodes in the thorax  are increased in size from prior exam and could also be involved, and there is a nonspecific 1.0 cm lesion inferiorly in the right hepatic lobe which could potentially be metastatic. 2. New subacute burst (AO type A4) fracture of L5, with mild posterior bony retropulsion and with a dominant coronally oriented fracture plane, and about 50% loss of height. Correlate with interval trauma. 3. Slight thickening along the left paracolic gutter and trace pelvic ascites, although the ascites is markedly reduced compared to 05/04/2020. 4. Other imaging findings of potential clinical significance: Aortic Atherosclerosis (ICD10-I70.0). Coronary atherosclerosis. Old compression fracture at L2.   12/03/2020 PET scan   IMPRESSION: Tumor at the GE junction with local perigastric infiltration, hepatic and nodal metastases.   Nodal involvement both in the chest and abdomen extending to the lower retroperitoneum in the abdomen.   Burst fracture at L5 shows signs of increased metabolic activity, nonspecific in the setting of fracture, in light of other findings would correlate with any history of trauma, if no history of trauma consider MRI to exclude the possibility of pathologic fracture.   12/27/2020 Genetic Testing   Negative hereditary cancer genetic testing: no pathogenic variants detected in [facility] Multi-Cancer +RNA Panel.  The report date is December 27, 2020.   The Multi-Cancer + RNA Panel offered by [facility] includes sequencing and/or deletion/duplication analysis of the following 84 genes:  AIP*, ALK, APC*, ATM*, AXIN2*, BAP1*, BARD1*, BLM*, BMPR1A*, BRCA1*, BRCA2*, BRIP1*, CASR, CDC73*, CDH1*, CDK4, CDKN1B*, CDKN1C*, CDKN2A, CEBPA, CHEK2*, CTNNA1*, DICER1*, DIS3L2*, EGFR, EPCAM, FH*, FLCN*, GATA2*, GPC3, GREM1, HOXB13, HRAS, KIT, MAX*, MEN1*, MET, MITF, MLH1*, MSH2*, MSH3*, MSH6*, MUTYH*, NBN*, NF1*, NF2*, NTHL1*, PALB2*, PDGFRA, PHOX2B, PMS2*, POLD1*, POLE*, POT1*, PRKAR1A*, PTCH1*, PTEN*,  RAD50*, RAD51C*, RAD51D*, RB1*, RECQL4, RET, RUNX1*, SDHA*, SDHAF2*, SDHB*, SDHC*, SDHD*, SMAD4*, SMARCA4*, SMARCB1*, SMARCE1*, STK11*, SUFU*, TERC, TERT, TMEM127*, Tp53*, TSC1*, TSC2*, VHL*, WRN*, and WT1.  RNA analysis is performed for * genes.   01/07/2021 - 05/07/2021 Chemotherapy   Patient is on Treatment Plan : GASTROESOPHAGEAL Nivolumab q14d x 8 cycles / Nivolumab q28d     03/26/2021 Imaging   CT CAP IMPRESSION: 1. Evidence of treatment response. There is decreased size of the irregular infiltrative mass centered at the gastroesophageal junction as well as decreased size of numerous metastatic retroperitoneal and upper abdominal lymph nodes. 2. 2.1 cm hepatic metastasis at the inferior right hepatic lobe segment 6 is stable to slightly increased in size since previous PET-CT. No new hepatic mass visualized. 3. A few new adjacent small pulmonary nodules in the posterior left lower lobe measuring up to 4 mm in size. Continued follow-up recommended. 4. Small right pleural effusion which is new since previous study. Small pericardial effusion increased since previous study. 5. Other ancillary findings as described.     06/30/2021 Imaging   EXAM: CT CHEST, ABDOMEN, AND PELVIS WITH CONTRAST  IMPRESSION: 1. Worsening nodal disease in the chest and abdomen more so within the abdomen as described. 2. Increasing size of RIGHT pleural fluid and pericardial effusion. Now moderate. 3. Increasing size of RIGHT hepatic lesion. 4. New LEFT adrenal metastasis. 5. Increasing size of retroperitoneal adenopathy. 6. Obstruction of the RIGHT ureter may be related to a metastatic lesion to the RIGHT ureter, blood clot or primary urothelial lesion and is associated with mild hydronephrosis, moderate ureteral dilation and potential filling defect also in lower pole calyces  of the RIGHT kidney. 7. Trace perihepatic ascites. 8. Aortic atherosclerosis.   07/17/2021 - 10/23/2021 Chemotherapy   Patient is on  Treatment Plan : GASTROESOPHAGEAL Ramucirumab D1, 15  / PACLitaxel D1,8,15 q28d     10/09/2021 -  Chemotherapy   Patient is on Treatment Plan : GASTROESOPHAGEAL Ramucirumab D1, 15 + Paclitaxel D1,8,15 q28d        INTERVAL HISTORY:  Randy Wood is here for a follow up of esophageal cancer. He was last seen by me on 11/20/21. He presents to the clinic accompanied by his daughter. He reports he is doing well overall. He reports some intermittent SOB.   All other systems were reviewed with the patient and are negative.  MEDICAL HISTORY:  Past Medical History:  Diagnosis Date   AKI (acute kidney injury) ([place])    Alcohol abuse    Arthritis    Cataract 2006   Diarrhea    Edema leg    Esophageal cancer ([place])    Family history of breast cancer 12/09/2020   Family history of ovarian cancer 12/09/2020   Family history of prostate cancer 12/09/2020   GERD (gastroesophageal reflux disease)    Glaucoma unknown   Hyperbilirubinemia    Hyperlipemia    Hypertension    Hypokalemia    Liver failure ([facility])    Macular degeneration, wet ([facility])    Microcytic anemia    Osteoarthritis    SOB (shortness of breath)    Weight gain     SURGICAL HISTORY: Past Surgical History:  Procedure Laterality Date   ANKLE SURGERY     BIOPSY  11/11/2020   Procedure: BIOPSY;  Surgeon: [name], MD;  Location: [facility];  Service: Gastroenterology;;   CATARACT EXTRACTION, BILATERAL     ESOPHAGOGASTRODUODENOSCOPY (EGD) WITH PROPOFOL N/A 11/11/2020   Procedure: ESOPHAGOGASTRODUODENOSCOPY (EGD) WITH PROPOFOL;  Surgeon: [name], MD;  Location: [facility];  Service: Gastroenterology;  Laterality: N/A;   EYE SURGERY  2006   FOREIGN BODY REMOVAL  11/11/2020   Procedure: FOREIGN BODY REMOVAL;  Surgeon: [name], MD;  Location: [facility];  Service: Gastroenterology;;   IR IMAGING GUIDED PORT INSERTION  07/16/2021   IR PARACENTESIS  05/09/2020   TONSILLECTOMY      I have reviewed the social  history and family history with the patient and they are unchanged from previous note.  ALLERGIES:  is allergic to lisinopril.  MEDICATIONS:  Current Outpatient Medications  Medication Sig Dispense Refill   Azelastine HCl 137 MCG/SPRAY SOLN INSTILL 2 SPRAYS INTO EACH NOSTRIL TWICE A DAY AS DIRECTED 30 mL 1   cetirizine (ZYRTEC) 10 MG tablet Take 10 mg by mouth daily.     dorzolamide-timolol (COSOPT) 22.3-6.8 MG/ML ophthalmic solution Place 1 drop into the left eye 2 (two) times daily.     EPINEPHrine 0.3 mg/0.3 mL IJ SOAJ injection Inject 0.3 mg into the muscle as needed for anaphylaxis.     ezetimibe (ZETIA) 10 MG tablet Take 1 tablet (10 mg total) by mouth daily. 90 tablet 3   Ferrous Sulfate (IRON SLOW RELEASE) 142 (45 Fe) MG TBCR Take 1 tablet by mouth daily. 30 tablet 3   GLUCOSAMINE-CHONDROITIN PO Take 1 capsule by mouth daily.     lactulose (CHRONULAC) 10 GM/15ML solution Take 15 mLs (10 g total) by mouth daily as needed for mild constipation. 236 mL 0   latanoprost (XALATAN) 0.005 % ophthalmic solution Place 1 drop into the left eye at bedtime.     levothyroxine (SYNTHROID) 88  MCG tablet Take 1 tablet (88 mcg total) by mouth daily. 90 tablet 1   lidocaine-prilocaine (EMLA) cream Apply 1 application. topically as needed. 30 g 0   moxifloxacin (VIGAMOX) 0.5 % ophthalmic solution      Multiple Vitamins-Minerals (ONE-A-DAY MENS 50+) TABS Take 1 tablet by mouth daily.     Multiple Vitamins-Minerals (PRESERVISION AREDS 2 PO) Take 1 tablet by mouth 2 (two) times daily.     ondansetron (ZOFRAN) 8 MG tablet Take 1 tablet (8 mg total) by mouth every 8 (eight) hours as needed for nausea or vomiting. 30 tablet 2   prochlorperazine (COMPAZINE) 10 MG tablet Take 1 tablet (10 mg total) by mouth every 6 (six) hours as needed. 30 tablet 2   spironolactone (ALDACTONE) 50 MG tablet TAKE ONE TABLET BY MOUTH DAILY 90 tablet 0   No current facility-administered medications for this visit.    PHYSICAL  EXAMINATION: ECOG PERFORMANCE STATUS: 1 - Symptomatic but completely ambulatory  Vitals:   12/02/21 1045  BP: (!) 97/56  Pulse: 66  Resp: 16  SpO2: 100%   Wt Readings from Last 3 Encounters:  12/02/21 172 lb 11.2 oz (78.3 kg)  11/20/21 173 lb 1.6 oz (78.5 kg)  11/06/21 176 lb 4 oz (79.9 kg)     GENERAL:alert, no distress and comfortable SKIN: skin color normal, no rashes or significant lesions EYES: normal, Conjunctiva are pink and non-injected, sclera clear  NEURO: alert & oriented x 3 with fluent speech  LABORATORY DATA:  I have reviewed the data as listed    Latest Ref Rng & Units 12/02/2021   10:00 AM 11/20/2021   10:01 AM 11/06/2021    8:52 AM  CBC  WBC 4.0 - 10.5 K/uL 2.6  5.7  1.9   Hemoglobin 13.0 - 17.0 g/dL 8.1  10.1  10.4   Hematocrit 39.0 - 52.0 % 24.3  30.4  31.2   Platelets 150 - 400 K/uL 168  142  160         Latest Ref Rng & Units 12/02/2021   10:16 AM 11/20/2021   10:01 AM 11/06/2021    8:52 AM  CMP  Glucose 70 - 99 mg/dL 107  106  111   BUN 8 - 23 mg/dL _0 Creatinine 0.61 - 1.24 mg/dL 1.01  0.85  1.12   Sodium 135 - 145 mmol/L 135  138  136   Potassium 3.5 - 5.1 mmol/L 4.1  4.3  4.1   Chloride 98 - 111 mmol/L 110  112  110   CO2 22 - 32 mmol/L _1 Calcium 8.9 - 10.3 mg/dL 8.4  8.9  8.2   Total Protein 6.5 - 8.1 g/dL 5.5  5.8  6.2   Total Bilirubin 0.3 - 1.2 mg/dL 0.8  0.7  0.6   Alkaline Phos 38 - 126 U/L 85  97  83   AST 15 - 41 U/L 39  37  29   ALT 0 - 44 U/L _2 RADIOGRAPHIC STUDIES: I have personally reviewed the radiological images as listed and agreed with the findings in the report. No results found.    Orders Placed This Encounter  Procedures   CBC with Differential ([facility] Only)    Standing Status:   Future    Standing Expiration Date:   01/16/2023   CMP ([facility] only)    Standing Status:  Future    Standing Expiration Date:   01/16/2023   CBC with Differential ([facility] Only)     Standing Status:   Future    Standing Expiration Date:   01/30/2023   CMP ([facility] only)    Standing Status:   Future    Standing Expiration Date:   01/30/2023   All questions were answered. The patient knows to call the clinic with any problems, questions or concerns. No barriers to learning was detected. The total time spent in the appointment was 30 minutes.     [name], MD 12/02/2021   I, Wilburn Mylar, am acting as scribe for [name], MD.   I have reviewed the above documentation for accuracy and completeness, and I agree with the above.

## 2021-12-03 MED FILL — Dexamethasone Sodium Phosphate Inj 100 MG/10ML: INTRAMUSCULAR | Qty: 1 | Status: AC

## 2021-12-04 ENCOUNTER — Telehealth: Payer: Self-pay | Admitting: *Deleted

## 2021-12-04 ENCOUNTER — Inpatient Hospital Stay: Payer: Medicare Other

## 2021-12-04 VITALS — BP 113/56 | HR 78 | Temp 97.7°F | Resp 16

## 2021-12-04 DIAGNOSIS — C779 Secondary and unspecified malignant neoplasm of lymph node, unspecified: Secondary | ICD-10-CM | POA: Diagnosis not present

## 2021-12-04 DIAGNOSIS — C155 Malignant neoplasm of lower third of esophagus: Secondary | ICD-10-CM

## 2021-12-04 DIAGNOSIS — D649 Anemia, unspecified: Secondary | ICD-10-CM | POA: Diagnosis not present

## 2021-12-04 DIAGNOSIS — Z5111 Encounter for antineoplastic chemotherapy: Secondary | ICD-10-CM | POA: Diagnosis not present

## 2021-12-04 DIAGNOSIS — C787 Secondary malignant neoplasm of liver and intrahepatic bile duct: Secondary | ICD-10-CM | POA: Diagnosis not present

## 2021-12-04 DIAGNOSIS — E0590 Thyrotoxicosis, unspecified without thyrotoxic crisis or storm: Secondary | ICD-10-CM | POA: Diagnosis not present

## 2021-12-04 MED ORDER — HEPARIN SOD (PORK) LOCK FLUSH 100 UNIT/ML IV SOLN
500.0000 [IU] | Freq: Once | INTRAVENOUS | Status: AC | PRN
  Administered 2021-12-04: 500 [IU]

## 2021-12-04 MED ORDER — SODIUM CHLORIDE 0.9 % IV SOLN
8.0000 mg/kg | Freq: Once | INTRAVENOUS | Status: AC
  Administered 2021-12-04: 600 mg via INTRAVENOUS
  Filled 2021-12-04: qty 50

## 2021-12-04 MED ORDER — FAMOTIDINE IN NACL 20-0.9 MG/50ML-% IV SOLN
20.0000 mg | Freq: Once | INTRAVENOUS | Status: AC
  Administered 2021-12-04: 20 mg via INTRAVENOUS
  Filled 2021-12-04: qty 50

## 2021-12-04 MED ORDER — SODIUM CHLORIDE 0.9% FLUSH
10.0000 mL | INTRAVENOUS | Status: DC | PRN
  Administered 2021-12-04: 10 mL

## 2021-12-04 MED ORDER — SODIUM CHLORIDE 0.9 % IV SOLN
60.0000 mg/m2 | Freq: Once | INTRAVENOUS | Status: AC
  Administered 2021-12-04: 120 mg via INTRAVENOUS
  Filled 2021-12-04: qty 20

## 2021-12-04 MED ORDER — SODIUM CHLORIDE 0.9 % IV SOLN: Freq: Once | INTRAVENOUS | Status: AC

## 2021-12-04 MED ORDER — SODIUM CHLORIDE 0.9 % IV SOLN
10.0000 mg | Freq: Once | INTRAVENOUS | Status: AC
  Administered 2021-12-04: 10 mg via INTRAVENOUS
  Filled 2021-12-04: qty 10

## 2021-12-04 MED ORDER — DIPHENHYDRAMINE HCL 50 MG/ML IJ SOLN
25.0000 mg | Freq: Once | INTRAMUSCULAR | Status: AC
  Administered 2021-12-04: 25 mg via INTRAVENOUS
  Filled 2021-12-04: qty 1

## 2021-12-04 NOTE — Patient Outreach (Signed)
  Care Coordination   12/04/2021 Name: Randy Wood MRN: [id number] DOB: 21-Sep-1940   Care Coordination Outreach Attempts:  A second unsuccessful outreach was attempted today to offer the patient with information about available care coordination services as a benefit of their health plan.     Follow Up Plan:  Additional outreach attempts will be made to offer the patient care coordination information and services.   Encounter Outcome:  No Answer  Care Coordination Interventions Activated:  No   Care Coordination Interventions:  No, not indicated    SIG Karleigh Bunte C. Myrtie Neither, MSN, [facility] Gerontological Nurse Practitioner [facility] Care Management [phone]

## 2021-12-05 ENCOUNTER — Encounter: Payer: Self-pay | Admitting: *Deleted

## 2021-12-05 ENCOUNTER — Telehealth: Payer: Self-pay | Admitting: *Deleted

## 2021-12-05 NOTE — Patient Outreach (Signed)
  Care Coordination   Initial Visit Note   12/05/2021 Name: Randy Wood MRN: [id number] DOB: 1940-04-28  Randy Wood is a 81 y.o. year old male who sees [name], [name], MD for primary care. I spoke with  Randy Wood by phone today.  What matters to the patients health and wellness today?  "To keep my health conditions managed"    Goals Addressed               This Visit's Progress     COMPLETED: "to keep my health conditions managed" (pt-stated)        Care Coordination Interventions: Patient interviewed about adult health maintenance status including  importance of yearly Annual Wellness Visit, pt reports AWV scheduled for 01/07/22 Provided education about importance of taking all medications as prescribed, attending all scheduled appointments, continuing with infusions for esophageal adenocarcinoma. Encouraged patient to stay up to date on vaccinations. Patient reports he lives alone, has 2 adult daughters that go with him to appointments and are very involved in his care. [facility] care coordination explained, patient agreeable to today's outreach, but declines any further outreach citing he has no needs.         SDOH assessments and interventions completed:  Yes  SDOH Interventions Today    Flowsheet Row Most Recent Value  SDOH Interventions   Food Insecurity Interventions Intervention Not Indicated  Housing Interventions Intervention Not Indicated  Transportation Interventions Intervention Not Indicated        Care Coordination Interventions Activated:  Yes  Care Coordination Interventions:  Yes, provided   Follow up plan: No further intervention required.   Encounter Outcome:  Pt. Visit Completed   Jacqlyn Larsen [facility], BSN [facility] RN Care Coordinator [phone]

## 2021-12-14 ENCOUNTER — Inpatient Hospital Stay (HOSPITAL_COMMUNITY)
Admission: EM | Admit: 2021-12-14 | Discharge: 2021-12-18 | DRG: 871 | Disposition: A | Discharge status: Home / Self Care | Payer: Medicare Other | Attending: Family Medicine | Admitting: Family Medicine

## 2021-12-14 ENCOUNTER — Other Ambulatory Visit: Payer: Self-pay

## 2021-12-14 ENCOUNTER — Encounter (HOSPITAL_COMMUNITY): Payer: Self-pay

## 2021-12-14 ENCOUNTER — Emergency Department (HOSPITAL_COMMUNITY): Payer: Medicare Other

## 2021-12-14 DIAGNOSIS — Z91030 Bee allergy status: Secondary | ICD-10-CM

## 2021-12-14 DIAGNOSIS — K769 Liver disease, unspecified: Secondary | ICD-10-CM | POA: Diagnosis present

## 2021-12-14 DIAGNOSIS — I10 Essential (primary) hypertension: Secondary | ICD-10-CM | POA: Diagnosis present

## 2021-12-14 DIAGNOSIS — Z79899 Other long term (current) drug therapy: Secondary | ICD-10-CM

## 2021-12-14 DIAGNOSIS — D509 Iron deficiency anemia, unspecified: Secondary | ICD-10-CM | POA: Diagnosis not present

## 2021-12-14 DIAGNOSIS — E872 Acidosis, unspecified: Secondary | ICD-10-CM | POA: Diagnosis present

## 2021-12-14 DIAGNOSIS — K7460 Unspecified cirrhosis of liver: Secondary | ICD-10-CM | POA: Diagnosis present

## 2021-12-14 DIAGNOSIS — Z87891 Personal history of nicotine dependence: Secondary | ICD-10-CM | POA: Diagnosis not present

## 2021-12-14 DIAGNOSIS — A4151 Sepsis due to Escherichia coli [E. coli]: Secondary | ICD-10-CM | POA: Diagnosis present

## 2021-12-14 DIAGNOSIS — D649 Anemia, unspecified: Secondary | ICD-10-CM | POA: Diagnosis not present

## 2021-12-14 DIAGNOSIS — R5383 Other fatigue: Secondary | ICD-10-CM | POA: Diagnosis not present

## 2021-12-14 DIAGNOSIS — R140 Abdominal distension (gaseous): Secondary | ICD-10-CM

## 2021-12-14 DIAGNOSIS — C779 Secondary and unspecified malignant neoplasm of lymph node, unspecified: Secondary | ICD-10-CM | POA: Diagnosis present

## 2021-12-14 DIAGNOSIS — K6389 Other specified diseases of intestine: Secondary | ICD-10-CM | POA: Diagnosis not present

## 2021-12-14 DIAGNOSIS — R7881 Bacteremia: Secondary | ICD-10-CM | POA: Diagnosis not present

## 2021-12-14 DIAGNOSIS — D61810 Antineoplastic chemotherapy induced pancytopenia: Secondary | ICD-10-CM | POA: Diagnosis present

## 2021-12-14 DIAGNOSIS — H409 Unspecified glaucoma: Secondary | ICD-10-CM | POA: Diagnosis present

## 2021-12-14 DIAGNOSIS — C155 Malignant neoplasm of lower third of esophagus: Secondary | ICD-10-CM | POA: Diagnosis present

## 2021-12-14 DIAGNOSIS — C787 Secondary malignant neoplasm of liver and intrahepatic bile duct: Secondary | ICD-10-CM | POA: Diagnosis present

## 2021-12-14 DIAGNOSIS — H3530 Unspecified macular degeneration: Secondary | ICD-10-CM | POA: Diagnosis present

## 2021-12-14 DIAGNOSIS — R338 Other retention of urine: Secondary | ICD-10-CM

## 2021-12-14 DIAGNOSIS — I3139 Other pericardial effusion (noninflammatory): Secondary | ICD-10-CM | POA: Diagnosis present

## 2021-12-14 DIAGNOSIS — E876 Hypokalemia: Secondary | ICD-10-CM | POA: Diagnosis present

## 2021-12-14 DIAGNOSIS — R5381 Other malaise: Secondary | ICD-10-CM | POA: Diagnosis not present

## 2021-12-14 DIAGNOSIS — D61818 Other pancytopenia: Secondary | ICD-10-CM | POA: Diagnosis present

## 2021-12-14 DIAGNOSIS — D709 Neutropenia, unspecified: Secondary | ICD-10-CM | POA: Diagnosis present

## 2021-12-14 DIAGNOSIS — E7800 Pure hypercholesterolemia, unspecified: Secondary | ICD-10-CM | POA: Diagnosis present

## 2021-12-14 DIAGNOSIS — E032 Hypothyroidism due to medicaments and other exogenous substances: Secondary | ICD-10-CM | POA: Diagnosis not present

## 2021-12-14 DIAGNOSIS — K219 Gastro-esophageal reflux disease without esophagitis: Secondary | ICD-10-CM | POA: Diagnosis present

## 2021-12-14 DIAGNOSIS — D701 Agranulocytosis secondary to cancer chemotherapy: Secondary | ICD-10-CM | POA: Diagnosis present

## 2021-12-14 DIAGNOSIS — B9620 Unspecified Escherichia coli [E. coli] as the cause of diseases classified elsewhere: Secondary | ICD-10-CM | POA: Diagnosis not present

## 2021-12-14 DIAGNOSIS — Z800 Family history of malignant neoplasm of digestive organs: Secondary | ICD-10-CM

## 2021-12-14 DIAGNOSIS — C159 Malignant neoplasm of esophagus, unspecified: Secondary | ICD-10-CM | POA: Diagnosis present

## 2021-12-14 DIAGNOSIS — D849 Immunodeficiency, unspecified: Secondary | ICD-10-CM | POA: Diagnosis present

## 2021-12-14 DIAGNOSIS — D72819 Decreased white blood cell count, unspecified: Secondary | ICD-10-CM | POA: Diagnosis not present

## 2021-12-14 DIAGNOSIS — Z888 Allergy status to other drugs, medicaments and biological substances status: Secondary | ICD-10-CM

## 2021-12-14 DIAGNOSIS — E039 Hypothyroidism, unspecified: Secondary | ICD-10-CM | POA: Diagnosis present

## 2021-12-14 DIAGNOSIS — J90 Pleural effusion, not elsewhere classified: Secondary | ICD-10-CM | POA: Diagnosis not present

## 2021-12-14 DIAGNOSIS — N390 Urinary tract infection, site not specified: Secondary | ICD-10-CM | POA: Diagnosis present

## 2021-12-14 DIAGNOSIS — R531 Weakness: Secondary | ICD-10-CM | POA: Diagnosis not present

## 2021-12-14 DIAGNOSIS — Z66 Do not resuscitate: Secondary | ICD-10-CM | POA: Diagnosis present

## 2021-12-14 DIAGNOSIS — D696 Thrombocytopenia, unspecified: Secondary | ICD-10-CM | POA: Diagnosis present

## 2021-12-14 DIAGNOSIS — Z79890 Hormone replacement therapy (postmenopausal): Secondary | ICD-10-CM | POA: Diagnosis not present

## 2021-12-14 DIAGNOSIS — R9431 Abnormal electrocardiogram [ECG] [EKG]: Secondary | ICD-10-CM | POA: Diagnosis not present

## 2021-12-14 DIAGNOSIS — R188 Other ascites: Secondary | ICD-10-CM | POA: Diagnosis present

## 2021-12-14 DIAGNOSIS — K7469 Other cirrhosis of liver: Secondary | ICD-10-CM | POA: Diagnosis not present

## 2021-12-14 DIAGNOSIS — R0602 Shortness of breath: Secondary | ICD-10-CM | POA: Diagnosis not present

## 2021-12-14 DIAGNOSIS — T451X5A Adverse effect of antineoplastic and immunosuppressive drugs, initial encounter: Secondary | ICD-10-CM | POA: Diagnosis present

## 2021-12-14 HISTORY — DX: Hypothyroidism, unspecified: E03.9

## 2021-12-14 LAB — COMPREHENSIVE METABOLIC PANEL
ALT: 18 U/L (ref 0–44)
AST: 30 U/L (ref 15–41)
Albumin: 3 g/dL — ABNORMAL LOW (ref 3.5–5.0)
Alkaline Phosphatase: 71 U/L (ref 38–126)
Anion gap: 6 (ref 5–15)
BUN: 16 mg/dL (ref 8–23)
CO2: 17 mmol/L — ABNORMAL LOW (ref 22–32)
Calcium: 8.1 mg/dL — ABNORMAL LOW (ref 8.9–10.3)
Chloride: 111 mmol/L (ref 98–111)
Creatinine, Ser: 1.21 mg/dL (ref 0.61–1.24)
GFR, Estimated: 60 mL/min — ABNORMAL LOW (ref >60)
Glucose, Bld: 93 mg/dL (ref 70–99)
Potassium: 4.5 mmol/L (ref 3.5–5.1)
Sodium: 134 mmol/L — ABNORMAL LOW (ref 135–145)
Total Bilirubin: 1.3 mg/dL — ABNORMAL HIGH (ref 0.3–1.2)
Total Protein: 5.6 g/dL — ABNORMAL LOW (ref 6.5–8.1)

## 2021-12-14 LAB — CBC WITH DIFFERENTIAL/PLATELET
Abs Immature Granulocytes: 0 10*3/uL (ref 0.00–0.07)
Basophils Absolute: 0 10*3/uL (ref 0.0–0.1)
Basophils Relative: 1 %
Eosinophils Absolute: 0 10*3/uL (ref 0.0–0.5)
Eosinophils Relative: 0 %
HCT: 22.9 % — ABNORMAL LOW (ref 39.0–52.0)
Hemoglobin: 7.3 g/dL — ABNORMAL LOW (ref 13.0–17.0)
Immature Granulocytes: 0 %
Lymphocytes Relative: 65 %
Lymphs Abs: 0.6 10*3/uL — ABNORMAL LOW (ref 0.7–4.0)
MCH: 30.9 pg (ref 26.0–34.0)
MCHC: 31.9 g/dL (ref 30.0–36.0)
MCV: 97 fL (ref 80.0–100.0)
Monocytes Absolute: 0.2 10*3/uL (ref 0.1–1.0)
Monocytes Relative: 23 %
Neutro Abs: 0.1 10*3/uL — CL (ref 1.7–7.7)
Neutrophils Relative %: 11 %
Platelets: 123 10*3/uL — ABNORMAL LOW (ref 150–400)
RBC: 2.36 MIL/uL — ABNORMAL LOW (ref 4.22–5.81)
RDW: 17.5 % — ABNORMAL HIGH (ref 11.5–15.5)
WBC: 0.9 10*3/uL — CL (ref 4.0–10.5)
nRBC: 2.2 % — ABNORMAL HIGH (ref 0.0–0.2)

## 2021-12-14 LAB — LACTIC ACID, PLASMA: Lactic Acid, Venous: 1.6 mmol/L (ref 0.5–1.9)

## 2021-12-14 LAB — D-DIMER, QUANTITATIVE: D-Dimer, Quant: 1.92 ug/mL-FEU — ABNORMAL HIGH (ref 0.00–0.50)

## 2021-12-14 LAB — TSH: TSH: 49.104 u[IU]/mL — ABNORMAL HIGH (ref 0.350–4.500)

## 2021-12-14 MED ORDER — EZETIMIBE 10 MG PO TABS
10.0000 mg | ORAL_TABLET | Freq: Every day | ORAL | Status: DC
  Administered 2021-12-15 – 2021-12-18 (×4): 10 mg via ORAL
  Filled 2021-12-14 (×4): qty 1

## 2021-12-14 MED ORDER — IOHEXOL 350 MG/ML SOLN
75.0000 mL | Freq: Once | INTRAVENOUS | Status: AC | PRN
  Administered 2021-12-14: 75 mL via INTRAVENOUS

## 2021-12-14 MED ORDER — DORZOLAMIDE HCL-TIMOLOL MAL 2-0.5 % OP SOLN
1.0000 [drp] | Freq: Two times a day (BID) | OPHTHALMIC | Status: DC
  Administered 2021-12-15 – 2021-12-18 (×5): 1 [drp] via OPHTHALMIC
  Filled 2021-12-14 (×2): qty 10

## 2021-12-14 MED ORDER — LACTULOSE 10 GM/15ML PO SOLN: 10.0000 g | Freq: Every day | ORAL | Status: DC | PRN

## 2021-12-14 MED ORDER — SPIRONOLACTONE 25 MG PO TABS
50.0000 mg | ORAL_TABLET | Freq: Every day | ORAL | Status: DC
  Filled 2021-12-14: qty 2

## 2021-12-14 MED ORDER — SODIUM CHLORIDE 0.9% IV SOLUTION: Freq: Once | INTRAVENOUS | Status: AC

## 2021-12-14 MED ORDER — PROCHLORPERAZINE EDISYLATE 10 MG/2ML IJ SOLN
10.0000 mg | Freq: Four times a day (QID) | INTRAMUSCULAR | Status: DC | PRN
  Administered 2021-12-18 (×2): 10 mg via INTRAVENOUS
  Filled 2021-12-14 (×3): qty 2

## 2021-12-14 MED ORDER — SODIUM CHLORIDE 0.9 % IV BOLUS
500.0000 mL | Freq: Once | INTRAVENOUS | Status: AC
  Administered 2021-12-14: 500 mL via INTRAVENOUS

## 2021-12-14 MED ORDER — AZELASTINE HCL 0.1 % NA SOLN: 2.0000 | Freq: Two times a day (BID) | NASAL | Status: DC | PRN

## 2021-12-14 MED ORDER — IOHEXOL 350 MG/ML SOLN: 100.0000 mL | Freq: Once | INTRAVENOUS | Status: DC | PRN

## 2021-12-14 MED ORDER — LATANOPROST 0.005 % OP SOLN
1.0000 [drp] | Freq: Every day | OPHTHALMIC | Status: DC
  Administered 2021-12-15 – 2021-12-17 (×4): 1 [drp] via OPHTHALMIC
  Filled 2021-12-14 (×2): qty 2.5

## 2021-12-14 MED ORDER — SENNOSIDES-DOCUSATE SODIUM 8.6-50 MG PO TABS: 1.0000 | ORAL_TABLET | Freq: Every evening | ORAL | Status: DC | PRN

## 2021-12-14 MED ORDER — LEVOTHYROXINE SODIUM 88 MCG PO TABS
88.0000 ug | ORAL_TABLET | Freq: Every day | ORAL | Status: DC
  Administered 2021-12-15 – 2021-12-16 (×2): 88 ug via ORAL
  Filled 2021-12-14 (×2): qty 1

## 2021-12-14 MED ORDER — SODIUM CHLORIDE 0.9 % IV BOLUS
1000.0000 mL | Freq: Once | INTRAVENOUS | Status: AC
  Administered 2021-12-14: 1000 mL via INTRAVENOUS

## 2021-12-14 NOTE — ED Provider Notes (Signed)
[facility] DEPT Provider Note   CSN: [id number] Arrival date & time: 12/14/21  1647     History  Chief Complaint  Patient presents with   Shortness of Breath   Fatigue    Randy Wood is a 81 y.o. male.  Patient has a history of esophageal cancer and is getting chemotherapy.  His last chemotherapy was September 7.  Patient states he is very short of breath and fatigued  The history is provided by the patient and medical records. No language interpreter was used.  Shortness of Breath Severity:  Moderate Onset quality:  Sudden Timing:  Constant Progression:  Worsening Chronicity:  New Relieved by:  Nothing Worsened by:  Nothing Ineffective treatments:  None tried Associated symptoms: no abdominal pain, no chest pain, no cough, no headaches and no rash   Risk factors: no recent alcohol use        Home Medications Prior to Admission medications   Medication Sig Start Date End Date Taking? Authorizing Provider  Azelastine HCl 137 MCG/SPRAY SOLN INSTILL 2 SPRAYS INTO EACH NOSTRIL TWICE A DAY AS DIRECTED 06/08/21   [name], [name], MD  cetirizine (ZYRTEC) 10 MG tablet Take 10 mg by mouth daily.    [provider]  dorzolamide-timolol (COSOPT) 22.3-6.8 MG/ML ophthalmic solution Place 1 drop into the left eye 2 (two) times daily.    [provider]  EPINEPHrine 0.3 mg/0.3 mL IJ SOAJ injection Inject 0.3 mg into the muscle as needed for anaphylaxis.    [provider]  ezetimibe (ZETIA) 10 MG tablet Take 1 tablet (10 mg total) by mouth daily. 04/18/21   Freada Bergeron, MD  Ferrous Sulfate (IRON SLOW RELEASE) 142 (45 Fe) MG TBCR Take 1 tablet by mouth daily. 09/10/21   [name], MD  GLUCOSAMINE-CHONDROITIN PO Take 1 capsule by mouth daily.    [provider]  lactulose (CHRONULAC) 10 GM/15ML solution Take 15 mLs (10 g total) by mouth daily as needed for mild constipation. 05/11/20   Regalado, Belkys A, MD   latanoprost (XALATAN) 0.005 % ophthalmic solution Place 1 drop into the left eye at bedtime.    [provider]  levothyroxine (SYNTHROID) 88 MCG tablet Take 1 tablet (88 mcg total) by mouth daily. 09/17/21   Shamleffer, Melanie Crazier, MD  lidocaine-prilocaine (EMLA) cream Apply 1 application. topically as needed. 07/17/21   Heilingoetter, Cassandra L, PA-C  moxifloxacin (VIGAMOX) 0.5 % ophthalmic solution  02/26/21   [provider]  Multiple Vitamins-Minerals (ONE-A-DAY MENS 50+) TABS Take 1 tablet by mouth daily.    [provider]  Multiple Vitamins-Minerals (PRESERVISION AREDS 2 PO) Take 1 tablet by mouth 2 (two) times daily.    [provider]  ondansetron (ZOFRAN) 8 MG tablet Take 1 tablet (8 mg total) by mouth every 8 (eight) hours as needed for nausea or vomiting. 07/17/21   Heilingoetter, Cassandra L, PA-C  prochlorperazine (COMPAZINE) 10 MG tablet Take 1 tablet (10 mg total) by mouth every 6 (six) hours as needed. 07/17/21   Heilingoetter, Cassandra L, PA-C  spironolactone (ALDACTONE) 50 MG tablet TAKE ONE TABLET BY MOUTH DAILY 10/06/21   [name], MD      Allergies    Lisinopril    Review of Systems   Review of Systems  Constitutional:  Negative for appetite change and fatigue.  HENT:  Negative for congestion, ear discharge and sinus pressure.   Eyes:  Negative for discharge.  Respiratory:  Positive for shortness  of breath. Negative for cough.   Cardiovascular:  Negative for chest pain.  Gastrointestinal:  Negative for abdominal pain and diarrhea.  Genitourinary:  Negative for frequency and hematuria.  Musculoskeletal:  Negative for back pain.  Skin:  Negative for rash.  Neurological:  Negative for seizures and headaches.  Psychiatric/Behavioral:  Negative for hallucinations.     Physical Exam Updated Vital Signs BP 108/65   Pulse 88   Temp 97.9 F (36.6 C) (Oral)   Resp (!) 23   SpO2 100%  Physical Exam Vitals and nursing note  reviewed.  Constitutional:      Appearance: He is well-developed.  HENT:     Head: Normocephalic.     Nose: Nose normal.  Eyes:     General: No scleral icterus.    Conjunctiva/sclera: Conjunctivae normal.  Neck:     Thyroid: No thyromegaly.  Cardiovascular:     Rate and Rhythm: Normal rate and regular rhythm.     Heart sounds: No murmur heard.    No friction rub. No gallop.  Pulmonary:     Breath sounds: No stridor. No wheezing or rales.  Chest:     Chest wall: No tenderness.  Abdominal:     General: There is no distension.     Tenderness: There is no abdominal tenderness. There is no rebound.  Musculoskeletal:        General: Normal range of motion.     Cervical back: Neck supple.  Lymphadenopathy:     Cervical: No cervical adenopathy.  Skin:    Findings: No erythema or rash.  Neurological:     Mental Status: He is alert and oriented to person, place, and time.     Motor: No abnormal muscle tone.     Coordination: Coordination normal.  Psychiatric:        Behavior: Behavior normal.     ED Results / Procedures / Treatments   Labs (all labs ordered are listed, but only abnormal results are displayed) Labs Reviewed  CBC WITH DIFFERENTIAL/PLATELET - Abnormal; Notable for the following components:      Result Value   WBC 0.9 (*)    RBC 2.36 (*)    Hemoglobin 7.3 (*)    HCT 22.9 (*)    RDW 17.5 (*)    Platelets 123 (*)    nRBC 2.2 (*)    Neutro Abs 0.1 (*)    Lymphs Abs 0.6 (*)    All other components within normal limits  COMPREHENSIVE METABOLIC PANEL - Abnormal; Notable for the following components:   Sodium 134 (*)    CO2 17 (*)    Calcium 8.1 (*)    Total Protein 5.6 (*)    Albumin 3.0 (*)    Total Bilirubin 1.3 (*)    GFR, Estimated 60 (*)    All other components within normal limits  D-DIMER, QUANTITATIVE - Abnormal; Notable for the following components:   D-Dimer, Quant 1.92 (*)    All other components within normal limits  CULTURE, BLOOD (ROUTINE X  2)  CULTURE, BLOOD (ROUTINE X 2)  LACTIC ACID, PLASMA    EKG None  Radiology CT Angio Chest PE W and/or Wo Contrast  Result Date: 12/14/2021 CLINICAL DATA:  Increased weakness over the last several days. History of esophageal cancer. Concern for pulmonary embolism. EXAM: CT ANGIOGRAPHY CHEST WITH CONTRAST TECHNIQUE: Multidetector CT imaging of the chest was performed using the standard protocol during bolus administration of intravenous contrast. Multiplanar CT image reconstructions and MIPs were  obtained to evaluate the vascular anatomy. RADIATION DOSE REDUCTION: This exam was performed according to the departmental dose-optimization program which includes automated exposure control, adjustment of the mA and/or kV according to patient size and/or use of iterative reconstruction technique. CONTRAST:  68m OMNIPAQUE IOHEXOL 350 MG/ML SOLN COMPARISON:  CT chest dated 10/21/2021. FINDINGS: Cardiovascular: Satisfactory opacification of the pulmonary arteries to the segmental level. No evidence of pulmonary embolism. The right and left pulmonary arteries are enlarged, measuring 2.6 cm and 2.3 cm respectively, suggestive of pulmonary hypertension. Vascular calcifications are seen in the coronary arteries and aortic arch. Normal heart size. There is a small pericardial effusion. A right internal jugular central venous port catheter terminates in the superior vena cava. Mediastinum/Nodes: No enlarged mediastinal, hilar, or axillary lymph nodes. Thyroid gland, trachea, and esophagus demonstrate no significant findings. Lungs/Pleura: There is a moderate right and small left pleural effusion with associated atelectasis. These are similar to 10/21/2021. No pneumothorax. No suspicious pulmonary nodules. Upper Abdomen: Ascites is partially imaged. Musculoskeletal: Degenerative changes are seen in the spine. Review of the MIP images confirms the above findings. IMPRESSION: 1. No evidence of acute pulmonary embolism. 2.  Moderate right and small left pleural effusions with associated atelectasis appear similar to 10/21/2021. 3. Findings suggestive of pulmonary hypertension. Small pericardial effusion. 4. No evidence of progression of the patient's known esophageal cancer. 5. Ascites is partially imaged. Aortic Atherosclerosis (ICD10-I70.0). Electronically Signed   By: TZerita BoersM.D.   On: 12/14/2021 19:54   DG ABD ACUTE 2+V W 1V CHEST  Result Date: 12/14/2021 CLINICAL DATA:  Short of breath.  Weakness and fatigue. EXAM: DG ABDOMEN ACUTE WITH 1 VIEW CHEST COMPARISON:  CT 10/21/2021. FINDINGS: In the left lower quadrant of the abdomen there are dilated small bowel loops measure up to 5 cm. Colonic gas is present up to the level of the rectum. No signs of pneumoperitoneum. Heart size is normal. There is no pleural effusion or edema. No airspace opacities identified. IMPRESSION: Dilated small bowel loops in the left lower quadrant of the abdomen with gas noted in the colon up to the rectum. Cannot exclude partial small bowel obstruction. Electronically Signed   By: TKerby MoorsM.D.   On: 12/14/2021 17:59    Procedures Procedures    Medications Ordered in ED Medications  sodium chloride 0.9 % bolus 500 mL (0 mLs Intravenous Stopped 12/14/21 2042)  iohexol (OMNIPAQUE) 350 MG/ML injection 75 mL (75 mLs Intravenous Contrast Given 12/14/21 1915)  sodium chloride 0.9 % bolus 1,000 mL (1,000 mLs Intravenous New Bag/Given 12/14/21 2049)    ED Course/ Medical Decision Making/ A&P  Patient with esophageal cancer and extreme fatigue.  I spoke with oncology and they stated the low white count should be not causing the fatigue.  I also said if he cannot take care of himself he could be admitted to obs by medicine                         Medical Decision Making Amount and/or Complexity of Data Reviewed Labs: ordered. Radiology: ordered. ECG/medicine tests: ordered.  Risk Prescription drug management. Decision regarding  hospitalization.  This patient presents to the ED for concern of shortness of breath, this involves an extensive number of treatment options, and is a complaint that carries with it a high risk of complications and morbidity.  The differential diagnosis includes PE, pneumonia   Co morbidities that complicate the patient evaluation  Esophageal cancer  Additional history obtained:  Additional history obtained from family External records from outside source obtained and reviewed including hospital records   Lab Tests:  I Ordered, and personally interpreted labs.  The pertinent results include: CBC shows leukopenia with white count 0.9   Imaging Studies ordered:  I ordered imaging studies including CT chest shows no PE I independently visualized and interpreted imaging which showed no PE I agree with the radiologist interpretation   Cardiac Monitoring: / EKG:  The patient was maintained on a cardiac monitor.  I personally viewed and interpreted the cardiac monitored which showed an underlying rhythm of: Normal sinus rhythm   Consultations Obtained:  I requested consultation with the hospitalist and oncologist,  and discussed lab and imaging findings as well as pertinent plan - they recommend: Admission   Problem List / ED Course / Critical interventions / Medication management  Subcu cancer and shortness of breath I ordered medication including saline for dehydration Reevaluation of the patient after these medicines showed that the patient stayed the same I have reviewed the patients home medicines and have made adjustments as needed   Social Determinants of Health:  Esophageal cancer   Test / Admission - Considered:  Patient will be admitted  Patient with extreme leukopenia and fatigue.  He will be admitted to medicine for        Final Clinical Impression(s) / ED Diagnoses Final diagnoses:  Other fatigue  Leukopenia, unspecified type    Rx / DC  Orders ED Discharge Orders     None         Milton Ferguson, MD 12/19/21 1142

## 2021-12-14 NOTE — Assessment & Plan Note (Signed)
Last TSH 52.56 on 09/16/2021.  At that time Synthroid was increased from 50 mcg to 88 mcg daily.  Suspect this may be due to nivolumab.    Here TSH 49 again, also FT4 low at 0.41 and T3 low at 41.  My partner discused with Dr. Kelton Pillar  during hospitalization, increased Synthroid from 88 mcg to 100 mcg daily - Continue levothyroxine - Repeat thyroid studies in 4-6 weeks

## 2021-12-14 NOTE — ED Triage Notes (Signed)
Pt states that for the last several days he has been feeling increasingly SOB during exertion, weakness and fatigue. Pt denies any CP during triage.

## 2021-12-14 NOTE — Assessment & Plan Note (Signed)
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  Follows with oncology, Dr. Burr Medico.  On active therapy with Taxol and Cyramza, last cycle 12/04/2021.

## 2021-12-14 NOTE — Assessment & Plan Note (Signed)
Mild and stable.  Continue to monitor.

## 2021-12-14 NOTE — Assessment & Plan Note (Signed)
Suspect this is most contributing to his fatigue and exertional dyspnea.  Hemoglobin down to 7.3 compared to baseline ~10.  Has not had any obvious bleeding. -Check anemia panel -Type and screen -Transfuse 1 unit PRBC

## 2021-12-14 NOTE — H&P (Signed)
History and Physical    Randy Wood WCH:[id number] DOB: 1941-02-01 DOA: 12/14/2021  PCP: [name], Raymond J, MD  Patient coming from: Home  I have personally briefly reviewed patient's old medical records in [facility]  Chief Complaint: Fatigue, dyspnea on exertion  HPI: Randy Wood is a 81 y.o. male with medical history significant for distal esophageal adenocarcinoma with hepatic and nodal metastases (on active treatment with Taxol and ramucirumab last cycle 12/04/2021), anemia of chronic disease and iron deficiency, hypothyroidism, HLD who presented to the ED for evaluation of fatigue and exertional dyspnea.  Patient reports some slow decrease in functional status since beginning his cancer treatment.  He has been ambulatory on his own in the house and uses a cane outside the house.  Over the last 2 days he has had new exertional dyspnea with minimal activity.  He has noticed significant fatigue, chills, and has been feeling dehydrated and thirsty.  He has low energy and has not been able to ambulate as he is used to his last 2 days.  He has not had any obvious bleeding.  He denies any subjective fevers, nausea, vomiting, abdominal pain, dysuria, lower extremity swelling.  He reports good urine output.  He reports having bowel movement approximately every other day.  ED Course  Labs/Imaging on admission: I have personally reviewed following labs and imaging studies.  Initial vitals showed BP 129/65, pulse 93, RR 25, temp 97.9 F, SPO2 100% on room air.  Labs show WBC 0.9, ANC 100, hemoglobin 7.3 (baseline ~10), platelets 123,000, sodium 134, potassium 4.5, bicarb 17, BUN 16, creatinine 1.21, serum glucose 93, D-dimer 1.92, lactic acid 1.6.  Blood cultures collected and pending.  Chest and abdominal x-ray showed dilated small bowel loops in the left lower quadrant of the abdomen with gas noted in the colon up to the rectum.  CTA chest negative for evidence of acute PE.   Moderate right and small left pleural effusions with associated atelectasis are seen, similar to prior study on 10/21/2021.  Small pericardial effusion noted.  No evidence of progression of the patient's known esophageal cancer seen.  Patient was given 1.5 L normal saline.  EDP discussed with on-call oncology who recommended medical admission for observation.  The hospitalist service was consulted to admit for further evaluation and management.  Review of Systems: All systems reviewed and are negative except as documented in history of present illness above.   Past Medical History:  Diagnosis Date   AKI (acute kidney injury) ([place])    Alcohol abuse    Arthritis    Cataract 2006   Diarrhea    Edema leg    Esophageal cancer ([place])    Family history of breast cancer 12/09/2020   Family history of ovarian cancer 12/09/2020   Family history of prostate cancer 12/09/2020   GERD (gastroesophageal reflux disease)    Glaucoma unknown   Hyperbilirubinemia    Hyperlipemia    Hypertension    Hypokalemia    Hypothyroidism 12/14/2021   Liver failure (HCC)    Macular degeneration, wet (HCC)    Microcytic anemia    Osteoarthritis    SOB (shortness of breath)    Weight gain     Past Surgical History:  Procedure Laterality Date   ANKLE SURGERY     BIOPSY  11/11/2020   Procedure: BIOPSY;  Surgeon: [name], MD;  Location: [facility];  Service: Gastroenterology;;   CATARACT EXTRACTION, BILATERAL     ESOPHAGOGASTRODUODENOSCOPY (EGD) WITH  PROPOFOL N/A 11/11/2020   Procedure: ESOPHAGOGASTRODUODENOSCOPY (EGD) WITH PROPOFOL;  Surgeon: [name], MD;  Location: [facility];  Service: Gastroenterology;  Laterality: N/A;   EYE SURGERY  2006   FOREIGN BODY REMOVAL  11/11/2020   Procedure: FOREIGN BODY REMOVAL;  Surgeon: [name], MD;  Location: [facility];  Service: Gastroenterology;;   IR IMAGING GUIDED PORT INSERTION  07/16/2021   IR PARACENTESIS  05/09/2020   TONSILLECTOMY      Social  History:  reports that he quit smoking about 49 years ago. His smoking use included cigarettes. He has a 30.00 pack-year smoking history. He has never used smokeless tobacco. He reports that he does not currently use alcohol. He reports that he does not use drugs.  Allergies  Allergen Reactions   Lisinopril Cough    Family History  Problem Relation Age of Onset   Ovarian cancer Mother 69   Cancer Mother    Prostate cancer Father        dx > 93   Cancer Father    Healthy Brother    Hashimoto's thyroiditis Daughter        youngest daughter   Breast cancer Daughter 7   Stomach cancer Cousin        d. 30; maternal male cousin   Colon cancer Paternal Grandmother        d. 26s   Cancer Daughter      Prior to Admission medications   Medication Sig Start Date End Date Taking? Authorizing Provider  Azelastine HCl 137 MCG/SPRAY SOLN INSTILL 2 SPRAYS INTO EACH NOSTRIL TWICE A DAY AS DIRECTED 06/08/21   [name], [name], MD  cetirizine (ZYRTEC) 10 MG tablet Take 10 mg by mouth daily.    [provider]  dorzolamide-timolol (COSOPT) 22.3-6.8 MG/ML ophthalmic solution Place 1 drop into the left eye 2 (two) times daily.    [provider]  EPINEPHrine 0.3 mg/0.3 mL IJ SOAJ injection Inject 0.3 mg into the muscle as needed for anaphylaxis.    [provider]  ezetimibe (ZETIA) 10 MG tablet Take 1 tablet (10 mg total) by mouth daily. 04/18/21   Freada Bergeron, MD  Ferrous Sulfate (IRON SLOW RELEASE) 142 (45 Fe) MG TBCR Take 1 tablet by mouth daily. 09/10/21   [name], MD  GLUCOSAMINE-CHONDROITIN PO Take 1 capsule by mouth daily.    [provider]  lactulose (CHRONULAC) 10 GM/15ML solution Take 15 mLs (10 g total) by mouth daily as needed for mild constipation. 05/11/20   Regalado, Belkys A, MD  latanoprost (XALATAN) 0.005 % ophthalmic solution Place 1 drop into the left eye at bedtime.    [provider]  levothyroxine (SYNTHROID) 88 MCG  tablet Take 1 tablet (88 mcg total) by mouth daily. 09/17/21   Shamleffer, Melanie Crazier, MD  lidocaine-prilocaine (EMLA) cream Apply 1 application. topically as needed. 07/17/21   Heilingoetter, Cassandra L, PA-C  moxifloxacin (VIGAMOX) 0.5 % ophthalmic solution  02/26/21   [provider]  Multiple Vitamins-Minerals (ONE-A-DAY MENS 50+) TABS Take 1 tablet by mouth daily.    [provider]  Multiple Vitamins-Minerals (PRESERVISION AREDS 2 PO) Take 1 tablet by mouth 2 (two) times daily.    [provider]  ondansetron (ZOFRAN) 8 MG tablet Take 1 tablet (8 mg total) by mouth every 8 (eight) hours as needed for nausea or vomiting. 07/17/21   Heilingoetter, Cassandra L, PA-C  prochlorperazine (COMPAZINE) 10 MG tablet Take 1 tablet (10 mg total) by mouth every  6 (six) hours as needed. 07/17/21   Heilingoetter, Cassandra L, PA-C  spironolactone (ALDACTONE) 50 MG tablet TAKE ONE TABLET BY MOUTH DAILY 10/06/21   [name], MD    Physical Exam: Vitals:   12/14/21 2115 12/14/21 2130 12/14/21 2200 12/14/21 2215  BP: (!) 121/58 (!) 127/55 112/60 (!) 115/53  Pulse: 88 87 86 88  Resp: 20 (!) 26 20 (!) 24  Temp:      TempSrc:      SpO2: 100% 100% 100% 100%   Constitutional: Resting in bed with head elevated, NAD, calm, comfortable Eyes: EOMI, lids and conjunctivae normal ENMT: Mucous membranes are moist. Posterior pharynx clear of any exudate or lesions. Neck: normal, supple, no masses. Respiratory: clear to auscultation bilaterally, no wheezing, no crackles. Normal respiratory effort. No accessory muscle use.  Cardiovascular: Regular rate and rhythm, no murmurs / rubs / gallops. No extremity edema. Abdomen: Slightly distended abdomen, no tenderness. Musculoskeletal: no clubbing / cyanosis. No joint deformity upper and lower extremities. Good ROM, no contractures. Normal muscle tone.  Skin: no rashes, lesions, ulcers. No induration Neurologic: Sensation intact. Strength equal  bilaterally. Psychiatric: Normal judgment and insight. Alert and oriented x 3. Normal mood.   EKG: Personally reviewed. Sinus rhythm, low voltage, QTc 627.  QTc more prolonged when compared to prior.  Assessment/Plan Principal Problem:   Symptomatic anemia Active Problems:   Pleural effusion   Neutropenia (HCC)   Liver disease, chronic   Adenocarcinoma of esophagus metastatic to liver (HCC)   Hypothyroidism   Hypercholesterolemia   Thrombocytopenia (HCC)   Pericardial effusion   Randy Wood is a 81 y.o. male with medical history significant for distal esophageal adenocarcinoma with hepatic and nodal metastases (on active treatment with Taxol and ramucirumab last cycle 12/04/2021), anemia of chronic disease and iron deficiency, hypothyroidism, HLD who is admitted with fatigue and exertional dyspnea likely due to symptomatic anemia.  Assessment and Plan: * Symptomatic anemia Suspect this is most contributing to his fatigue and exertional dyspnea.  Hemoglobin down to 7.3 compared to baseline ~10.  Has not had any obvious bleeding. -Check anemia panel -Type and screen -Transfuse 1 unit PRBC  Pleural effusion Pericardial effusion CT shows stable moderate right and small left pleural effusion as well as small pericardial effusion.  He follows with cardiology and last echocardiogram 09/26/2021 also showed small pericardial effusion with EF 65-70%.  Given stability, these are less likely to have contributed to his acute symptoms.  Neutropenia ([place]) Secondary to chemotherapy.  WBC 0.9 with ANC 100.  He is afebrile without any obvious active infection. -Blood cultures obtained -If fever or other signs/symptoms of infection develop then would start on empiric cefepime  Liver disease, chronic Secondary to metastatic disease with ascites.  No sign of hepatic encephalopathy on admission. -Continue spironolactone 50 mg daily -Patient states he was taken off Lasix due to issues with  hypokalemia -Continue lactulose  Hypothyroidism Last TSH 52.56 on 09/16/2021.  At that time Synthroid was increased from 50 mcg to 88 mcg daily.  May be contributing to symptoms of still subtherapeutic. 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  Follows with oncology, Dr. Burr Medico.  On active therapy with Taxol and Cyramza, last cycle 12/04/2021.  Thrombocytopenia (HCC) Mild and stable.  Continue to monitor.  Hypercholesterolemia Continue Zetia.  DVT prophylaxis: SCDs Start: 12/14/21 2134 Code Status: DNR,  confirmed with patient on admission Family Communication: Daughter at bedside Disposition Plan: From home and likely discharge to home pending clinical progress Consults called: None Severity of Illness: The appropriate patient status for this patient is OBSERVATION. Observation status is judged to be reasonable and necessary in order to provide the required intensity of service to ensure the patient's safety. The patient's presenting symptoms, physical exam findings, and initial radiographic and laboratory data in the context of their medical condition is felt to place them at decreased risk for further clinical deterioration. Furthermore, it is anticipated that the patient will be medically stable for discharge from the hospital within 2 midnights of admission.   Zada Finders MD [facility]  If 7PM-7AM, please contact night-coverage [email or web address]  12/14/2021, 10:20 PM

## 2021-12-14 NOTE — Assessment & Plan Note (Signed)
Secondary to chemotherapy.  WBC 0.9 with ANC 100.  He is afebrile without any obvious active infection. -Blood cultures obtained -If fever or other signs/symptoms of infection develop then would start on empiric cefepime

## 2021-12-14 NOTE — ED Notes (Signed)
Pt returned from CT °

## 2021-12-14 NOTE — Assessment & Plan Note (Addendum)
Pericardial effusion CT shows stable moderate right and small left pleural effusion as well as small pericardial effusion.  He follows with cardiology and last echocardiogram 09/26/2021 also showed small pericardial effusion with EF 65-70%.  Given stability, these are less likely to have contributed to his acute symptoms.

## 2021-12-14 NOTE — Hospital Course (Signed)
Randy Wood is an 81 y.o. M with esophageal CA metastatic to liver and LNs on Taxol/ramucirumab, anemia, hypothyroidism, and HLD who presented with fatigue  In the ER, found to have pancytopenia, ANC 99, abdominal distension and urinary retention.     9/17: Admitted and transfused 1 unit 9/18: Blood cultures growing E coli, started Rocephin 9/19: Granix ordered

## 2021-12-14 NOTE — Assessment & Plan Note (Signed)
Continue Zetia. °

## 2021-12-14 NOTE — Assessment & Plan Note (Addendum)
Transaminitis due to mets.   - Hold spironolactone for now - Continue lactulose

## 2021-12-15 ENCOUNTER — Other Ambulatory Visit: Payer: Self-pay

## 2021-12-15 ENCOUNTER — Encounter (HOSPITAL_COMMUNITY): Payer: Self-pay | Admitting: Internal Medicine

## 2021-12-15 DIAGNOSIS — E872 Acidosis, unspecified: Secondary | ICD-10-CM | POA: Diagnosis present

## 2021-12-15 DIAGNOSIS — K6389 Other specified diseases of intestine: Secondary | ICD-10-CM | POA: Diagnosis not present

## 2021-12-15 DIAGNOSIS — R338 Other retention of urine: Secondary | ICD-10-CM | POA: Diagnosis not present

## 2021-12-15 DIAGNOSIS — B9620 Unspecified Escherichia coli [E. coli] as the cause of diseases classified elsewhere: Secondary | ICD-10-CM

## 2021-12-15 DIAGNOSIS — D849 Immunodeficiency, unspecified: Secondary | ICD-10-CM | POA: Diagnosis present

## 2021-12-15 DIAGNOSIS — D649 Anemia, unspecified: Secondary | ICD-10-CM | POA: Diagnosis present

## 2021-12-15 DIAGNOSIS — D709 Neutropenia, unspecified: Secondary | ICD-10-CM

## 2021-12-15 DIAGNOSIS — R188 Other ascites: Secondary | ICD-10-CM | POA: Diagnosis present

## 2021-12-15 DIAGNOSIS — D701 Agranulocytosis secondary to cancer chemotherapy: Secondary | ICD-10-CM | POA: Diagnosis present

## 2021-12-15 DIAGNOSIS — C155 Malignant neoplasm of lower third of esophagus: Secondary | ICD-10-CM | POA: Diagnosis present

## 2021-12-15 DIAGNOSIS — Z79890 Hormone replacement therapy (postmenopausal): Secondary | ICD-10-CM | POA: Diagnosis not present

## 2021-12-15 DIAGNOSIS — Z87891 Personal history of nicotine dependence: Secondary | ICD-10-CM | POA: Diagnosis not present

## 2021-12-15 DIAGNOSIS — R7881 Bacteremia: Secondary | ICD-10-CM

## 2021-12-15 DIAGNOSIS — K7460 Unspecified cirrhosis of liver: Secondary | ICD-10-CM | POA: Diagnosis present

## 2021-12-15 DIAGNOSIS — Z66 Do not resuscitate: Secondary | ICD-10-CM | POA: Diagnosis present

## 2021-12-15 DIAGNOSIS — N390 Urinary tract infection, site not specified: Secondary | ICD-10-CM | POA: Diagnosis present

## 2021-12-15 DIAGNOSIS — J90 Pleural effusion, not elsewhere classified: Secondary | ICD-10-CM | POA: Diagnosis present

## 2021-12-15 DIAGNOSIS — C779 Secondary and unspecified malignant neoplasm of lymph node, unspecified: Secondary | ICD-10-CM | POA: Diagnosis present

## 2021-12-15 DIAGNOSIS — D509 Iron deficiency anemia, unspecified: Secondary | ICD-10-CM

## 2021-12-15 DIAGNOSIS — D61810 Antineoplastic chemotherapy induced pancytopenia: Secondary | ICD-10-CM | POA: Diagnosis present

## 2021-12-15 DIAGNOSIS — I10 Essential (primary) hypertension: Secondary | ICD-10-CM | POA: Diagnosis present

## 2021-12-15 DIAGNOSIS — E7800 Pure hypercholesterolemia, unspecified: Secondary | ICD-10-CM | POA: Diagnosis present

## 2021-12-15 DIAGNOSIS — R140 Abdominal distension (gaseous): Secondary | ICD-10-CM | POA: Diagnosis not present

## 2021-12-15 DIAGNOSIS — C159 Malignant neoplasm of esophagus, unspecified: Secondary | ICD-10-CM

## 2021-12-15 DIAGNOSIS — E032 Hypothyroidism due to medicaments and other exogenous substances: Secondary | ICD-10-CM

## 2021-12-15 DIAGNOSIS — E876 Hypokalemia: Secondary | ICD-10-CM | POA: Diagnosis present

## 2021-12-15 DIAGNOSIS — K7469 Other cirrhosis of liver: Secondary | ICD-10-CM | POA: Diagnosis not present

## 2021-12-15 DIAGNOSIS — E039 Hypothyroidism, unspecified: Secondary | ICD-10-CM | POA: Diagnosis present

## 2021-12-15 DIAGNOSIS — Z79899 Other long term (current) drug therapy: Secondary | ICD-10-CM | POA: Diagnosis not present

## 2021-12-15 DIAGNOSIS — C787 Secondary malignant neoplasm of liver and intrahepatic bile duct: Secondary | ICD-10-CM | POA: Diagnosis present

## 2021-12-15 DIAGNOSIS — A4151 Sepsis due to Escherichia coli [E. coli]: Secondary | ICD-10-CM | POA: Diagnosis present

## 2021-12-15 DIAGNOSIS — I3139 Other pericardial effusion (noninflammatory): Secondary | ICD-10-CM | POA: Diagnosis present

## 2021-12-15 DIAGNOSIS — D61818 Other pancytopenia: Secondary | ICD-10-CM | POA: Diagnosis present

## 2021-12-15 DIAGNOSIS — Z91030 Bee allergy status: Secondary | ICD-10-CM | POA: Diagnosis not present

## 2021-12-15 LAB — DIFFERENTIAL
Abs Immature Granulocytes: 0 10*3/uL (ref 0.00–0.07)
Basophils Absolute: 0 10*3/uL (ref 0.0–0.1)
Basophils Relative: 2 %
Eosinophils Absolute: 0 10*3/uL (ref 0.0–0.5)
Eosinophils Relative: 0 %
Immature Granulocytes: 0 %
Lymphocytes Relative: 61 %
Lymphs Abs: 0.7 10*3/uL (ref 0.7–4.0)
Monocytes Absolute: 0.3 10*3/uL (ref 0.1–1.0)
Monocytes Relative: 29 %
Neutro Abs: 0.1 10*3/uL — CL (ref 1.7–7.7)
Neutrophils Relative %: 8 %

## 2021-12-15 LAB — IRON AND TIBC
Iron: 24 ug/dL — ABNORMAL LOW (ref 45–182)
Saturation Ratios: 10 % — ABNORMAL LOW (ref 17.9–39.5)
TIBC: 239 ug/dL — ABNORMAL LOW (ref 250–450)
UIBC: 215 ug/dL

## 2021-12-15 LAB — BLOOD CULTURE ID PANEL (REFLEXED) - BCID2

## 2021-12-15 LAB — CBC
HCT: 24.1 % — ABNORMAL LOW (ref 39.0–52.0)
Hemoglobin: 7.9 g/dL — ABNORMAL LOW (ref 13.0–17.0)
MCH: 31.1 pg (ref 26.0–34.0)
MCHC: 32.8 g/dL (ref 30.0–36.0)
MCV: 94.9 fL (ref 80.0–100.0)
Platelets: 100 10*3/uL — ABNORMAL LOW (ref 150–400)
RBC: 2.54 MIL/uL — ABNORMAL LOW (ref 4.22–5.81)
RDW: 17.1 % — ABNORMAL HIGH (ref 11.5–15.5)
WBC: 1.1 10*3/uL — CL (ref 4.0–10.5)
nRBC: 0 % (ref 0.0–0.2)

## 2021-12-15 LAB — COMPREHENSIVE METABOLIC PANEL
ALT: 15 U/L (ref 0–44)
AST: 26 U/L (ref 15–41)
Albumin: 2.5 g/dL — ABNORMAL LOW (ref 3.5–5.0)
Alkaline Phosphatase: 59 U/L (ref 38–126)
Anion gap: 5 (ref 5–15)
BUN: 15 mg/dL (ref 8–23)
CO2: 16 mmol/L — ABNORMAL LOW (ref 22–32)
Calcium: 7.6 mg/dL — ABNORMAL LOW (ref 8.9–10.3)
Chloride: 113 mmol/L — ABNORMAL HIGH (ref 98–111)
Creatinine, Ser: 1.12 mg/dL (ref 0.61–1.24)
GFR, Estimated: >60 mL/min (ref >60)
Glucose, Bld: 88 mg/dL (ref 70–99)
Potassium: 4.1 mmol/L (ref 3.5–5.1)
Sodium: 134 mmol/L — ABNORMAL LOW (ref 135–145)
Total Bilirubin: 1.4 mg/dL — ABNORMAL HIGH (ref 0.3–1.2)
Total Protein: 4.6 g/dL — ABNORMAL LOW (ref 6.5–8.1)

## 2021-12-15 LAB — URINALYSIS, ROUTINE W REFLEX MICROSCOPIC
Bilirubin Urine: NEGATIVE
Glucose, UA: NEGATIVE mg/dL
Hgb urine dipstick: NEGATIVE
Ketones, ur: 5 mg/dL — AB
Leukocytes,Ua: NEGATIVE
Nitrite: NEGATIVE
Protein, ur: NEGATIVE mg/dL
Specific Gravity, Urine: 1.035 — ABNORMAL HIGH (ref 1.005–1.030)
pH: 5 (ref 5.0–8.0)

## 2021-12-15 LAB — PREPARE RBC (CROSSMATCH)

## 2021-12-15 LAB — ABO/RH: ABO/RH(D): A POS

## 2021-12-15 LAB — T4, FREE: Free T4: 0.41 ng/dL — ABNORMAL LOW (ref 0.61–1.12)

## 2021-12-15 LAB — VITAMIN B12: Vitamin B-12: 1117 pg/mL — ABNORMAL HIGH (ref 180–914)

## 2021-12-15 LAB — FOLATE: Folate: 23.5 ng/mL (ref >5.9)

## 2021-12-15 LAB — FERRITIN: Ferritin: 47 ng/mL (ref 24–336)

## 2021-12-15 MED ORDER — SODIUM CHLORIDE 0.9 % IV SOLN
2.0000 g | INTRAVENOUS | Status: DC
  Administered 2021-12-15 – 2021-12-16 (×2): 2 g via INTRAVENOUS
  Filled 2021-12-15 (×2): qty 20

## 2021-12-15 MED ORDER — LORATADINE 10 MG PO TABS
10.0000 mg | ORAL_TABLET | Freq: Every day | ORAL | Status: DC
  Administered 2021-12-15 – 2021-12-18 (×4): 10 mg via ORAL
  Filled 2021-12-15 (×4): qty 1

## 2021-12-15 MED ORDER — TBO-FILGRASTIM 480 MCG/0.8ML ~~LOC~~ SOSY
480.0000 ug | PREFILLED_SYRINGE | Freq: Once | SUBCUTANEOUS | Status: AC
  Administered 2021-12-15: 480 ug via SUBCUTANEOUS
  Filled 2021-12-15: qty 0.8

## 2021-12-15 MED ORDER — ONE-A-DAY MENS 50+ PO TABS: 1.0000 | ORAL_TABLET | Freq: Every day | ORAL | Status: DC

## 2021-12-15 MED ORDER — FERROUS SULFATE 325 (65 FE) MG PO TABS
325.0000 mg | ORAL_TABLET | Freq: Every day | ORAL | Status: DC
  Administered 2021-12-15 – 2021-12-18 (×4): 325 mg via ORAL
  Filled 2021-12-15 (×4): qty 1

## 2021-12-15 MED ORDER — ADULT MULTIVITAMIN W/MINERALS CH
1.0000 | ORAL_TABLET | Freq: Every day | ORAL | Status: DC
  Administered 2021-12-15 – 2021-12-18 (×4): 1 via ORAL
  Filled 2021-12-15 (×4): qty 1

## 2021-12-15 NOTE — TOC Initial Note (Signed)
Transition of Care [facility]) - Initial/Assessment Note    Patient Details  Name: Randy Wood MRN: [id number] Date of Birth: [birth year]  Transition of [facility]) CM/SW Contact:    Leeroy Cha, RN Phone Number: 12/15/2021, 7:34 AM  Clinical Narrative:                  Transition of Care [facility]) Screening Note   Patient Details  Name: Randy Wood Date of Birth: Apr 03, 1940   Transition of Care [facility]) CM/SW Contact:    Leeroy Cha, RN Phone Number: 12/15/2021, 7:34 AM    Transition of Care Department [facility]) has reviewed patient and no TOC needs have been identified at this time. We will continue to monitor patient advancement through interdisciplinary progression rounds. If new patient transition needs arise, please place a TOC consult.    Expected Discharge Plan: Home/Self Care Barriers to Discharge: Continued Medical Work up   Patient Goals and CMS Choice Patient states their goals for this hospitalization and ongoing recovery are:: to return home CMS Medicare.gov Compare Post Acute Care list provided to:: Patient Choice offered to / list presented to : Patient  Expected Discharge Plan and Services Expected Discharge Plan: Home/Self Care   Discharge Planning Services: CM Consult   Living arrangements for the past 2 months: Single Family Home                                      Prior Living Arrangements/Services Living arrangements for the past 2 months: Single Family Home Lives with:: Self Patient language and need for interpreter reviewed:: Yes Do you feel safe going back to the place where you live?: Yes            Criminal Activity/Legal Involvement Pertinent to Current Situation/Hospitalization: No - Comment as needed  Activities of Daily Living Home Assistive Devices/Equipment: Cane (specify quad or straight) ADL Screening (condition at time of admission) Patient's cognitive ability adequate to safely complete daily activities?:  Yes Is the patient deaf or have difficulty hearing?: No Does the patient have difficulty seeing, even when wearing glasses/contacts?: Yes Does the patient have difficulty concentrating, remembering, or making decisions?: No Patient able to express need for assistance with ADLs?: Yes Does the patient have difficulty dressing or bathing?: No Independently performs ADLs?: Yes (appropriate for developmental age) Does the patient have difficulty walking or climbing stairs?: No Weakness of Legs: Both Weakness of Arms/Hands: None  Permission Sought/Granted                  Emotional Assessment Appearance:: Appears stated age Attitude/Demeanor/Rapport: Engaged Affect (typically observed): Calm Orientation: : Oriented to Self, Oriented to Place, Oriented to  Time, Oriented to Situation Alcohol / Substance Use: Tobacco Use (quit smoking 49 years ago no etoh or drug intake) Psych Involvement: No (comment)  Admission diagnosis:  Symptomatic anemia [D64.9] Other fatigue [R53.83] Leukopenia, unspecified type [D72.819] Patient Active Problem List   Diagnosis Date Noted   Symptomatic anemia 12/14/2021   Thrombocytopenia ([place]) 12/14/2021   Hypothyroidism 12/14/2021   Pleural effusion 12/14/2021   Pericardial effusion 00/93/8182   Folliculitis 99/37/1696   Neutropenia ([place]) 09/04/2021   Port-A-Cath in place 07/22/2021   Constipation 07/08/2021   Dysphagia 07/08/2021   Personal history of colonic polyps 07/08/2021   Sinus disease 02/12/2021   Exudative age-related macular degeneration, right eye, with active choroidal neovascularization ([place]) 02/05/2021   Posterior  capsular opacification of left eye, obscuring vision 02/05/2021   Primary open angle glaucoma (POAG) of left eye, severe stage 02/05/2021   Pseudophakia of both eyes 02/05/2021   Hypokalemia 01/06/2021   Genetic testing 12/30/2020   Family history of breast cancer 12/09/2020   Family history of ovarian cancer 12/09/2020    Family history of prostate cancer 12/09/2020   Adenocarcinoma of esophagus metastatic to liver ([place]) 11/25/2020   Infection due to ESBL-producing Escherichia coli 05/08/2020   Liver disease, chronic 05/08/2020   SBP (spontaneous bacterial peritonitis) ([place])    Anasarca 05/04/2020   Liver failure ([place]) 05/04/2020   Acute renal failure (ARF) ([place]) 05/04/2020   Dyspnea on exertion 05/04/2020   Macrocytic anemia 05/04/2020   Transaminitis 05/04/2020   Hyperbilirubinemia 05/04/2020   Acne rosacea 03/20/2020   Adenomatous polyp of colon 03/20/2020   Body mass index (BMI) 31.0-31.9, adult 03/20/2020   Degenerative joint disease of ankle 03/20/2020   Erectile dysfunction 03/20/2020   Esophageal stricture 03/20/2020   Essential hypertension 03/20/2020   Essential tremor 03/20/2020   Gait abnormality 03/20/2020   Gastro-esophageal reflux disease without esophagitis 03/20/2020   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   Hypercholesterolemia 03/20/2020   Irritable bowel syndrome 03/20/2020   Male hypogonadism 03/20/2020   Osteoarthritis of knee 03/20/2020   Perennial allergic rhinitis 03/20/2020   Vasomotor rhinitis 03/20/2020   PCP:  [name], Raymond J, MD Pharmacy:   [facility] [id number] - [place], [place] - [address] [place] [address] Phone: [phone] Fax: [phone]  [facility], [address] [place] [address] Phone: [phone] Fax: [phone]     Social Determinants of Health (SDOH) Interventions Housing Interventions: Intervention Not Indicated  Readmission Risk Interventions   No data to display

## 2021-12-15 NOTE — Assessment & Plan Note (Signed)
1/4 bottles positive E. Coli. We speculate urinary source.  Pan-sensitive on final culture today. - Narrow to cefazolin - Folow repeat cultures

## 2021-12-15 NOTE — Progress Notes (Signed)
Progress Note    Randy Wood   IRC:[id number]  DOB: 09-Jan-1941  DOA: 12/14/2021     0 PCP: [name], Raymond J, MD  Initial CC: weakness  Hospital Course: Randy Wood is a 81 y.o. 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 He was found to have worsened anemia, hemoglobin 7.3 g/dL on admission.  He also had severe neutropenia and worsened iron deficiency.  He did endorse difficulty with having diarrhea at home associated with lactulose use.  He states it is typically "all or nothing".  He denied having any urinary symptoms though does have some difficulty with initiating voiding.  His abdomen was also more distended on admission. X-ray and CT performed showed dilated small bowel loops in the left lower quadrant with gas up to the rectum.  This raised concern for possible obstruction however he had no nausea/vomiting and continued to have bowel movements.  He was continued on a diet with close monitoring.  He also had urinary retention with excessive PVR on bladder scan for which Foley catheter was placed.  Granix was initiated by oncology for his severe neutropenia.  After admission, blood cultures became positive for E. coli and he was started on Rocephin as well.  Interval History:  Seen this morning in his room with daughter present bedside.  Main complaint as mentioned on admission was his ongoing weakness.  He does state that he has had bouts with diarrhea at home associated with lactulose.  He has also been having difficulty going to the bathroom initiating a urine stream.  His belly is more distended than usual but he denies any nausea or vomiting. Later in the morning after Foley catheter was placed, his abdomen was less  distended.  Assessment and Plan: * E coli bacteremia - 1/4 bottles positive from admission with E. Coli. Given the urinary retention it's possible he had UTI with translocation. Although he does have diarrhea but says this is associated with lactulose use as well at home - checking UA and GI panel - start Rocephin - given immunocompromised, will repeat blood cultures tomorrow to monitor for clearance  Symptomatic anemia - Also contributing to his weakness along with underlying bacteremia - Hemoglobin 7.3 g/dL on admission.  Received 1 unit PRBC on morning of 12/15/2021.  Repeat hemoglobin 7.9 g/dL - repeat Hgb in am  Severe neutropenia (HCC) - ANC 99 on admission  - afebrile but now is bacteremic and on antibiotics - place on neutropenic precautions - Granix has been ordered by oncology - trend ANC  Acute urinary retention - Patient endorsing difficulty with initiating voiding - Bladder scan performed 9/18 revealing approximately 780 cc -Possibly contributing to his abdominal distention as well - Foley catheter placed with some abdominal decompression achieved - can attempt TOV after treating the multiple acute issues currently and patient having improved energy/strength  Iron deficiency anemia - Iron stores monitored outpatient.  He has been on Slow Fe - Last iron check from 12/02/2021 showed improved iron stores, however repeat on admission shows significantly lower iron levels - Continue on oral iron only for now in setting of underlying bacteremia.  Holding off on IV iron replacement  Hypothyroidism Last TSH 52.56 on 09/16/2021.  At that time Synthroid was increased from 50 mcg to 88 mcg daily.  May  be contributing to symptoms if still subtherapeutic as well - etiology presumed due to nivolumab - check FT4 and TT3 - patient follows with Dr. Kelton Pillar -Continue current dose Synthroid 88 mcg daily; further adjustment pending hormone levels; confirmed patient is taking correctly  as well   Pleural effusion CT shows stable moderate right and small left pleural effusion as well as small pericardial effusion.  He follows with cardiology and last echocardiogram 09/26/2021 also showed small pericardial effusion with EF 65-70%. - currently no respiratory symptoms and these are stable  Liver disease, chronic Secondary to metastatic disease with ascites.  No sign of hepatic encephalopathy on admission. -Continue spironolactone 50 mg daily -Patient states he was taken off Lasix due to issues with hypokalemia -Continue lactulose  Adenocarcinoma of esophagus metastatic to liver (HCC) Known distal esophageal adenocarcinoma with hepatic and nodal metastases.  Follows with oncology, Dr. Burr Medico.  On active therapy with Taxol and Cyramza, last cycle 12/04/2021.  Thrombocytopenia (HCC) Mild and stable.  Continue to monitor.  Hypercholesterolemia Continue Zetia.   Old records reviewed in assessment of this patient  Antimicrobials: Rocephin 9/18 >> current   DVT prophylaxis:  SCDs Start: 12/14/21 2134   Code Status:   Code Status: DNR  Mobility Assessment (last 72 hours)     Mobility Assessment     Row Name 12/15/21 0930 12/15/21 0200         Does patient have an order for bedrest or is patient medically unstable No - Continue assessment No - Continue assessment      What is the highest level of mobility based on the progressive mobility assessment? Level 5 (Walks with assist in room/hall) - Balance while stepping forward/back and can walk in room with assist - Complete Level 4 (Walks with assist in room) - Balance while marching in place and cannot step forward and back - Complete               Barriers to discharge: none Disposition Plan:  Home in 2-3 days Status is: Inpatient  Objective: Blood pressure 100/61, pulse 73, temperature 97.8 F (36.6 C), temperature source Oral, resp. rate 16, height '5\' 11"'$  (1.803 m), weight 82.9 kg, SpO2 99 %.  Examination:   Physical Exam Constitutional:      General: He is not in acute distress.    Comments: Fatigued appearing  HENT:     Head: Normocephalic and atraumatic.     Mouth/Throat:     Mouth: Mucous membranes are moist.  Eyes:     Extraocular Movements: Extraocular movements intact.  Cardiovascular:     Rate and Rhythm: Normal rate and regular rhythm.  Pulmonary:     Effort: Pulmonary effort is normal.     Breath sounds: Normal breath sounds.  Abdominal:     General: Bowel sounds are normal. There is distension.     Palpations: Abdomen is soft.     Tenderness: There is no abdominal tenderness. There is no guarding or rebound.  Musculoskeletal:        General: Normal range of motion.     Cervical back: Normal range of motion and neck supple.  Skin:    General: Skin is warm and dry.  Neurological:     General: No focal deficit present.     Mental Status: He is alert.  Psychiatric:        Mood and Affect: Mood normal.      Consultants:  Oncology  Procedures:    Data Reviewed: Results for orders  placed or performed during the hospital encounter of 12/14/21 (from the past 24 hour(s))  CBC with Differential     Status: Abnormal   Collection Time: 12/14/21  6:10 PM  Result Value Ref Range   WBC 0.9 (LL) 4.0 - 10.5 K/uL   RBC 2.36 (L) 4.22 - 5.81 MIL/uL   Hemoglobin 7.3 (L) 13.0 - 17.0 g/dL   HCT 22.9 (L) 39.0 - 52.0 %   MCV 97.0 80.0 - 100.0 fL   MCH 30.9 26.0 - 34.0 pg   MCHC 31.9 30.0 - 36.0 g/dL   RDW 17.5 (H) 11.5 - 15.5 %   Platelets 123 (L) 150 - 400 K/uL   nRBC 2.2 (H) 0.0 - 0.2 %   Neutrophils Relative % 11 %   Neutro Abs 0.1 (LL) 1.7 - 7.7 K/uL   Lymphocytes Relative 65 %   Lymphs Abs 0.6 (L) 0.7 - 4.0 K/uL   Monocytes Relative 23 %   Monocytes Absolute 0.2 0.1 - 1.0 K/uL   Eosinophils Relative 0 %   Eosinophils Absolute 0.0 0.0 - 0.5 K/uL   Basophils Relative 1 %   Basophils Absolute 0.0 0.0 - 0.1 K/uL   Immature Granulocytes 0 %   Abs Immature Granulocytes  0.00 0.00 - 0.07 K/uL  Comprehensive metabolic panel     Status: Abnormal   Collection Time: 12/14/21  6:10 PM  Result Value Ref Range   Sodium 134 (L) 135 - 145 mmol/L   Potassium 4.5 3.5 - 5.1 mmol/L   Chloride 111 98 - 111 mmol/L   CO2 17 (L) 22 - 32 mmol/L   Glucose, Bld 93 70 - 99 mg/dL   BUN 16 8 - 23 mg/dL   Creatinine, Ser 1.21 0.61 - 1.24 mg/dL   Calcium 8.1 (L) 8.9 - 10.3 mg/dL   Total Protein 5.6 (L) 6.5 - 8.1 g/dL   Albumin 3.0 (L) 3.5 - 5.0 g/dL   AST 30 15 - 41 U/L   ALT 18 0 - 44 U/L   Alkaline Phosphatase 71 38 - 126 U/L   Total Bilirubin 1.3 (H) 0.3 - 1.2 mg/dL   GFR, Estimated 60 (L) >60 mL/min   Anion gap 6 5 - 15  D-dimer, quantitative     Status: Abnormal   Collection Time: 12/14/21  6:10 PM  Result Value Ref Range   D-Dimer, Quant 1.92 (H) 0.00 - 0.50 ug/mL-FEU  ABO/Rh     Status: None   Collection Time: 12/14/21  6:10 PM  Result Value Ref Range   ABO/RH(D)      A POS Performed at [facility], [place] [address] [place], [place] [address]   Lactic acid, plasma     Status: None   Collection Time: 12/14/21  6:32 PM  Result Value Ref Range   Lactic Acid, Venous 1.6 0.5 - 1.9 mmol/L  Blood culture (routine x 2)     Status: None (Preliminary result)   Collection Time: 12/14/21  6:33 PM   Specimen: BLOOD  Result Value Ref Range   Specimen Description      BLOOD SITE NOT SPECIFIED Performed at [facility] [address], [place],  [address]    Special Requests      BOTTLES DRAWN AEROBIC AND ANAEROBIC Blood Culture adequate volume Performed at [facility] [address], [place], [place] [address]    Culture  Setup Time      GRAM NEGATIVE RODS ANAEROBIC BOTTLE ONLY CRITICAL RESULT  CALLED TO, READ BACK BY AND VERIFIED WITH: PHARMD A PHAM [id number] AT 1059 AM BY CM Performed at [facility], [place] [address], [place], [place] [address]    Culture GRAM NEGATIVE RODS    Report Status  PENDING   Blood Culture ID Panel (Reflexed)     Status: Abnormal   Collection Time: 12/14/21  6:33 PM  Result Value Ref Range   Enterococcus faecalis NOT DETECTED NOT DETECTED   Enterococcus Faecium NOT DETECTED NOT DETECTED   Listeria monocytogenes NOT DETECTED NOT DETECTED   Staphylococcus species NOT DETECTED NOT DETECTED   Staphylococcus aureus (BCID) NOT DETECTED NOT DETECTED   Staphylococcus epidermidis NOT DETECTED NOT DETECTED   Staphylococcus lugdunensis NOT DETECTED NOT DETECTED   Streptococcus species NOT DETECTED NOT DETECTED   Streptococcus agalactiae NOT DETECTED NOT DETECTED   Streptococcus pneumoniae NOT DETECTED NOT DETECTED   Streptococcus pyogenes NOT DETECTED NOT DETECTED   A.calcoaceticus-baumannii NOT DETECTED NOT DETECTED   Bacteroides fragilis NOT DETECTED NOT DETECTED   Enterobacterales DETECTED (A) NOT DETECTED   Enterobacter cloacae complex NOT DETECTED NOT DETECTED   Escherichia coli DETECTED (A) NOT DETECTED   Klebsiella aerogenes NOT DETECTED NOT DETECTED   Klebsiella oxytoca NOT DETECTED NOT DETECTED   Klebsiella pneumoniae NOT DETECTED NOT DETECTED   Proteus species NOT DETECTED NOT DETECTED   Salmonella species NOT DETECTED NOT DETECTED   Serratia marcescens NOT DETECTED NOT DETECTED   Haemophilus influenzae NOT DETECTED NOT DETECTED   Neisseria meningitidis NOT DETECTED NOT DETECTED   Pseudomonas aeruginosa NOT DETECTED NOT DETECTED   Stenotrophomonas maltophilia NOT DETECTED NOT DETECTED   Candida albicans NOT DETECTED NOT DETECTED   Candida auris NOT DETECTED NOT DETECTED   Candida glabrata NOT DETECTED NOT DETECTED   Candida krusei NOT DETECTED NOT DETECTED   Candida parapsilosis NOT DETECTED NOT DETECTED   Candida tropicalis NOT DETECTED NOT DETECTED   Cryptococcus neoformans/gattii NOT DETECTED NOT DETECTED   CTX-M ESBL NOT DETECTED NOT DETECTED   Carbapenem resistance IMP NOT DETECTED NOT DETECTED   Carbapenem resistance KPC NOT DETECTED  NOT DETECTED   Carbapenem resistance NDM NOT DETECTED NOT DETECTED   Carbapenem resist OXA 48 LIKE NOT DETECTED NOT DETECTED   Carbapenem resistance VIM NOT DETECTED NOT DETECTED  Blood culture (routine x 2)     Status: None (Preliminary result)   Collection Time: 12/14/21  8:45 PM   Specimen: BLOOD LEFT FOREARM  Result Value Ref Range   Specimen Description      BLOOD LEFT FOREARM Performed at [facility], [place] [address], [place], [place] [address]    Special Requests      BOTTLES DRAWN AEROBIC AND ANAEROBIC Blood Culture results may not be optimal due to an inadequate volume of blood received in culture bottles Performed at [facility], [place] [address], [place], [place] [address]    Culture      NO GROWTH < 12 HOURS Performed at [facility] [address], [place], [place] [address]    Report Status PENDING   Vitamin B12     Status: Abnormal   Collection Time: 12/14/21 10:10 PM  Result Value Ref Range   Vitamin B-12 1,117 (H) 180 - 914 pg/mL  Iron and TIBC     Status: Abnormal   Collection Time: 12/14/21 10:10 PM  Result Value Ref Range   Iron 24 (L) 45 - 182 ug/dL   TIBC 239 (L) 250 - 450 ug/dL  Saturation Ratios 10 (L) 17.9 - 39.5 %   UIBC 215 ug/dL  Ferritin     Status: None   Collection Time: 12/14/21 10:10 PM  Result Value Ref Range   Ferritin 47 24 - 336 ng/mL  TSH     Status: Abnormal   Collection Time: 12/14/21 10:10 PM  Result Value Ref Range   TSH 49.104 (H) 0.350 - 4.500 uIU/mL  Type and screen [place]     Status: None (Preliminary result)   Collection Time: 12/14/21 10:10 PM  Result Value Ref Range   ABO/RH(D) A POS    Antibody Screen NEG    Sample Expiration 12/17/2021,2359    Unit Number [id number]    Blood Component Type RED CELLS,LR    Unit division 00    Status of Unit ISSUED    Transfusion Status OK TO TRANSFUSE    Crossmatch Result      Compatible Performed at [facility], [place] [address], [place], [place] [address]   Prepare RBC (crossmatch)     Status: None   Collection Time: 12/14/21 10:10 PM  Result Value Ref Range   Order Confirmation      ORDER PROCESSED BY BLOOD BANK Performed at [facility] [address], [place], [place] [address]   Folate     Status: None   Collection Time: 12/14/21 10:10 PM  Result Value Ref Range   Folate 23.5 >5.9 ng/mL  Comprehensive metabolic panel     Status: Abnormal   Collection Time: 12/15/21  5:13 AM  Result Value Ref Range   Sodium 134 (L) 135 - 145 mmol/L   Potassium 4.1 3.5 - 5.1 mmol/L   Chloride 113 (H) 98 - 111 mmol/L   CO2 16 (L) 22 - 32 mmol/L   Glucose, Bld 88 70 - 99 mg/dL   BUN 15 8 - 23 mg/dL   Creatinine, Ser 1.12 0.61 - 1.24 mg/dL   Calcium 7.6 (L) 8.9 - 10.3 mg/dL   Total Protein 4.6 (L) 6.5 - 8.1 g/dL   Albumin 2.5 (L) 3.5 - 5.0 g/dL   AST 26 15 - 41 U/L   ALT 15 0 - 44 U/L   Alkaline Phosphatase 59 38 - 126 U/L   Total Bilirubin 1.4 (H) 0.3 - 1.2 mg/dL   GFR, Estimated >60 >60 mL/min   Anion gap 5 5 - 15  CBC     Status: Abnormal   Collection Time: 12/15/21  5:13 AM  Result Value Ref Range   WBC 1.1 (LL) 4.0 - 10.5 K/uL   RBC 2.54 (L) 4.22 - 5.81 MIL/uL   Hemoglobin 7.9 (L) 13.0 - 17.0 g/dL   HCT 24.1 (L) 39.0 - 52.0 %   MCV 94.9 80.0 - 100.0 fL   MCH 31.1 26.0 - 34.0 pg   MCHC 32.8 30.0 - 36.0 g/dL   RDW 17.1 (H) 11.5 - 15.5 %   Platelets 100 (L) 150 - 400 K/uL   nRBC 0.0 0.0 - 0.2 %  Differential     Status: Abnormal   Collection Time: 12/15/21  5:13 AM  Result Value Ref Range   Neutrophils Relative % 8 %   Neutro Abs 0.1 (LL) 1.7 - 7.7 K/uL   Lymphocytes Relative 61 %   Lymphs Abs 0.7 0.7 - 4.0 K/uL   Monocytes Relative 29 %   Monocytes Absolute 0.3 0.1 - 1.0 K/uL   Eosinophils Relative 0 %   Eosinophils Absolute 0.0  0.0 - 0.5 K/uL   Basophils Relative 2 %   Basophils Absolute 0.0 0.0 - 0.1 K/uL   Immature Granulocytes 0 %    Abs Immature Granulocytes 0.00 0.00 - 0.07 K/uL   Burr Cells PRESENT    Polychromasia PRESENT    Ovalocytes PRESENT   Urinalysis, Routine w reflex microscopic Urine, Catheterized     Status: Abnormal   Collection Time: 12/15/21 11:54 AM  Result Value Ref Range   Color, Urine YELLOW YELLOW   APPearance CLEAR CLEAR   Specific Gravity, Urine 1.035 (H) 1.005 - 1.030   pH 5.0 5.0 - 8.0   Glucose, UA NEGATIVE NEGATIVE mg/dL   Hgb urine dipstick NEGATIVE NEGATIVE   Bilirubin Urine NEGATIVE NEGATIVE   Ketones, ur 5 (A) NEGATIVE mg/dL   Protein, ur NEGATIVE NEGATIVE mg/dL   Nitrite NEGATIVE NEGATIVE   Leukocytes,Ua NEGATIVE NEGATIVE    I have Reviewed nursing notes, Vitals, and Lab results since pt's last encounter. Pertinent lab results : see above I have ordered test including BMP, CBC, Mg I have reviewed the last note from staff over past 24 hours I have discussed pt's care plan and test results with nursing staff, case manager   LOS: 0 days   Dwyane Dee, MD [facility] 12/15/2021, 12:45 PM

## 2021-12-15 NOTE — Plan of Care (Signed)
No c/o of pain this am.  Pt retaining urine.  Bladder scanned and foley inserted per MD.  Problem: Activity: Goal: Risk for activity intolerance will decrease Outcome: Not Progressing   Problem: Nutrition: Goal: Adequate nutrition will be maintained Outcome: Not Progressing   Problem: Education: Goal: Knowledge of General Education information will improve Description: Including pain rating scale, medication(s)/side effects and non-pharmacologic comfort measures Outcome: Progressing   Problem: Health Behavior/Discharge Planning: Goal: Ability to manage health-related needs will improve Outcome: Progressing   Problem: Clinical Measurements: Goal: Ability to maintain clinical measurements within normal limits will improve Outcome: Progressing Goal: Will remain free from infection Outcome: Progressing Goal: Diagnostic test results will improve Outcome: Progressing Goal: Respiratory complications will improve Outcome: Progressing Goal: Cardiovascular complication will be avoided Outcome: Progressing   Problem: Coping: Goal: Level of anxiety will decrease Outcome: Progressing   Problem: Elimination: Goal: Will not experience complications related to bowel motility Outcome: Progressing Goal: Will not experience complications related to urinary retention Outcome: Progressing   Problem: Pain Managment: Goal: General experience of comfort will improve Outcome: Progressing   Problem: Safety: Goal: Ability to remain free from injury will improve Outcome: Progressing   Problem: Skin Integrity: Goal: Risk for impaired skin integrity will decrease Outcome: Progressing

## 2021-12-15 NOTE — Assessment & Plan Note (Addendum)
Iron studies low.  Improving clinically, safe to give IV iron today. - IV iron once - Continue oral iron

## 2021-12-15 NOTE — Assessment & Plan Note (Signed)
Placed on admission due to PV residual >700.  Acute issues seem better. - Trial of voiding

## 2021-12-16 ENCOUNTER — Inpatient Hospital Stay: Payer: Medicare Other

## 2021-12-16 ENCOUNTER — Inpatient Hospital Stay (HOSPITAL_COMMUNITY): Payer: Medicare Other

## 2021-12-16 ENCOUNTER — Telehealth: Payer: Self-pay

## 2021-12-16 DIAGNOSIS — R7881 Bacteremia: Secondary | ICD-10-CM | POA: Diagnosis not present

## 2021-12-16 DIAGNOSIS — C159 Malignant neoplasm of esophagus, unspecified: Secondary | ICD-10-CM | POA: Diagnosis not present

## 2021-12-16 DIAGNOSIS — R140 Abdominal distension (gaseous): Secondary | ICD-10-CM | POA: Diagnosis not present

## 2021-12-16 DIAGNOSIS — R338 Other retention of urine: Secondary | ICD-10-CM | POA: Diagnosis not present

## 2021-12-16 LAB — CBC WITH DIFFERENTIAL/PLATELET
Abs Immature Granulocytes: 0 10*3/uL (ref 0.00–0.07)
Basophils Absolute: 0 10*3/uL (ref 0.0–0.1)
Basophils Relative: 1 %
Eosinophils Absolute: 0 10*3/uL (ref 0.0–0.5)
Eosinophils Relative: 0 %
HCT: 25.9 % — ABNORMAL LOW (ref 39.0–52.0)
Hemoglobin: 8.2 g/dL — ABNORMAL LOW (ref 13.0–17.0)
Immature Granulocytes: 0 %
Lymphocytes Relative: 35 %
Lymphs Abs: 0.9 10*3/uL (ref 0.7–4.0)
MCH: 30.3 pg (ref 26.0–34.0)
MCHC: 31.7 g/dL (ref 30.0–36.0)
MCV: 95.6 fL (ref 80.0–100.0)
Monocytes Absolute: 0.9 10*3/uL (ref 0.1–1.0)
Monocytes Relative: 33 %
Neutro Abs: 0.8 10*3/uL — ABNORMAL LOW (ref 1.7–7.7)
Neutrophils Relative %: 31 %
Platelets: 102 10*3/uL — ABNORMAL LOW (ref 150–400)
RBC: 2.71 MIL/uL — ABNORMAL LOW (ref 4.22–5.81)
RDW: 17.6 % — ABNORMAL HIGH (ref 11.5–15.5)
WBC: 2.6 10*3/uL — ABNORMAL LOW (ref 4.0–10.5)
nRBC: 0.8 % — ABNORMAL HIGH (ref 0.0–0.2)

## 2021-12-16 LAB — BASIC METABOLIC PANEL
Anion gap: 6 (ref 5–15)
BUN: 17 mg/dL (ref 8–23)
CO2: 16 mmol/L — ABNORMAL LOW (ref 22–32)
Calcium: 7.8 mg/dL — ABNORMAL LOW (ref 8.9–10.3)
Chloride: 113 mmol/L — ABNORMAL HIGH (ref 98–111)
Creatinine, Ser: 1.31 mg/dL — ABNORMAL HIGH (ref 0.61–1.24)
GFR, Estimated: 54 mL/min — ABNORMAL LOW (ref >60)
Glucose, Bld: 114 mg/dL — ABNORMAL HIGH (ref 70–99)
Potassium: 3.7 mmol/L (ref 3.5–5.1)
Sodium: 135 mmol/L (ref 135–145)

## 2021-12-16 LAB — CREATININE, URINE, RANDOM: Creatinine, Urine: 300 mg/dL

## 2021-12-16 LAB — MAGNESIUM: Magnesium: 2 mg/dL (ref 1.7–2.4)

## 2021-12-16 LAB — GASTROINTESTINAL PANEL BY PCR, STOOL (REPLACES STOOL CULTURE)

## 2021-12-16 LAB — TYPE AND SCREEN
ABO/RH(D): A POS
Antibody Screen: NEGATIVE
Unit division: 0

## 2021-12-16 LAB — T3: T3, Total: 41 ng/dL — ABNORMAL LOW (ref 71–180)

## 2021-12-16 LAB — BPAM RBC
Blood Product Expiration Date: 202310102359
ISSUE DATE / TIME: 202309180116
Unit Type and Rh: 6200

## 2021-12-16 LAB — SODIUM, URINE, RANDOM: Sodium, Ur: <10 mmol/L

## 2021-12-16 MED ORDER — ONDANSETRON HCL 4 MG/2ML IJ SOLN
4.0000 mg | Freq: Once | INTRAMUSCULAR | Status: AC
  Administered 2021-12-16: 4 mg via INTRAVENOUS
  Filled 2021-12-16: qty 2

## 2021-12-16 MED ORDER — ONDANSETRON HCL 4 MG/2ML IJ SOLN: 4.0000 mg | Freq: Four times a day (QID) | INTRAMUSCULAR | Status: DC | PRN

## 2021-12-16 MED ORDER — SALINE SPRAY 0.65 % NA SOLN
1.0000 | NASAL | Status: DC | PRN
  Filled 2021-12-16: qty 44

## 2021-12-16 MED ORDER — LEVOTHYROXINE SODIUM 100 MCG PO TABS
100.0000 ug | ORAL_TABLET | Freq: Every day | ORAL | Status: DC
  Administered 2021-12-17 – 2021-12-18 (×2): 100 ug via ORAL
  Filled 2021-12-16 (×2): qty 1

## 2021-12-16 MED ORDER — LACTATED RINGERS IV SOLN: INTRAVENOUS | Status: DC

## 2021-12-16 NOTE — Assessment & Plan Note (Signed)
X-ray on admission showed some dilated bowel.  He is still distended, but passing gas, stool, appetite okay, no vomiting.

## 2021-12-16 NOTE — Progress Notes (Signed)
**Note Randy-Identified via Obfuscation** Progress Note    Randy Wood   EHU:[id number]  DOB: 06-01-1940  DOA: 12/14/2021     1 PCP: Randy [place], Raymond J, MD  Initial CC: weakness  Hospital Course: Randy WHIDBEE is a 81 y.o. 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 He was found to have worsened anemia, hemoglobin 7.3 g/dL on admission.  He also had severe neutropenia and worsened iron deficiency.  He did endorse difficulty with having diarrhea at home associated with lactulose use.  He states it is typically "all or nothing".  He denied having any urinary symptoms though does have some difficulty with initiating voiding.  His abdomen was also more distended on admission. X-ray and CT performed showed dilated small bowel loops in the left lower quadrant with gas up to the rectum.  This raised concern for possible obstruction however he had no nausea/vomiting and continued to have bowel movements.  He was continued on a diet with close monitoring.  He also had urinary retention with excessive PVR on bladder scan for which Foley catheter was placed.  Granix was initiated by oncology for his severe neutropenia.  After admission, blood cultures became positive for E. coli and he was started on Rocephin as well.  Interval History:  Patient having some sharp pains associated with Foley catheter.  Still has poor intake and wishing for diet liberalization.  Abdominal distention persists but has not worsened.  He does have some ongoing nausea but has not significantly worsened.  He states he had a bowel movement yesterday.  Assessment and Plan: * E coli bacteremia - 1/4 bottles positive from admission with E. Coli. Given the urinary retention it's possible he had UTI with  translocation. Although he does have diarrhea but says this is associated with lactulose use as well at home -UA negative for evidence of UTI; GI pathogen panel also negative - continue Rocephin - given immunocompromised, will repeat blood cultures; follow up 9/19 cultures for clearance at least 48 hrs  Symptomatic anemia - Also contributing to his weakness along with underlying bacteremia - Hemoglobin 7.3 g/dL on admission.  Received 1 unit PRBC on morning of 12/15/2021.  Repeat hemoglobin 7.9 g/dL - repeat Hgb in am -Hemoglobin holding stable, continue trending  Severe neutropenia (HCC) - ANC 99 on admission  - afebrile but now is bacteremic and on antibiotics - place on neutropenic precautions - Granix has been ordered by oncology - trend Randy Wood - [place] today   Abdominal distension - X-ray on admission showed dilated small bowel loops in the left lower quadrant with gas up to the rectum -After Foley placement, abdominal distention improved some but still lingers.  He does have bowel function and nausea is minimal.  He does not appear to clinically have obstruction at this time - Continue following distention for further improvement.  If worsening signs/symptoms, will need to Randy-escalate diet and pursue further work-up - Repeat x-ray today  Acute urinary retention - Patient endorsing difficulty with initiating voiding - Bladder scan performed 9/18 revealing approximately 780 cc -Possibly contributing to his abdominal distention as well - Foley catheter placed with some abdominal decompression achieved - can attempt TOV after treating the multiple acute issues currently and patient having improved energy/strength  Iron deficiency anemia - Iron stores monitored outpatient.  He has  been on Slow Fe - Last iron check from 12/02/2021 showed improved iron stores, however repeat on admission shows significantly lower iron levels - Continue on oral iron only for now in setting of underlying  bacteremia.  Holding off on IV iron replacement  Hypothyroidism Last TSH 52.56 on 09/16/2021.  At that time Synthroid was increased from 50 mcg to 88 mcg daily.  May be contributing to symptoms if still subtherapeutic as well - etiology presumed due to nivolumab - TSH 49.104 - low FT4 (0.41) and TT3 (41) - patient follows with Dr. Kelton Pillar (discussed with her as well during hospitalization) - increase Synthroid from 88 mcg to 100 mcg daily - repeat thyroid studies in 4-6 weeks for further adjustment  Pleural effusion CT shows stable moderate right and small left pleural effusion as well as small pericardial effusion.  He follows with cardiology and last echocardiogram 09/26/2021 also showed small pericardial effusion with EF 65-70%. - currently no respiratory symptoms and these are stable  Liver disease, chronic Secondary to metastatic disease with ascites.  No sign of hepatic encephalopathy on admission. 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  Follows with oncology, Dr. Burr Medico.  On active therapy with Taxol and Cyramza, last cycle 12/04/2021.  Thrombocytopenia (HCC) Mild and stable.  Continue to monitor.  Hypercholesterolemia Continue Zetia.   Old records reviewed in assessment of this patient  Antimicrobials: Rocephin 9/18 >> current   DVT prophylaxis:  SCDs Start: 12/14/21 2134   Code Status:   Code Status: DNR  Mobility Assessment (last 72 hours)     Mobility Assessment     Row Name 12/16/21 0945 12/15/21 2131 12/15/21 0930 12/15/21 0200     Does patient have an order for bedrest or is patient medically unstable No - Continue assessment No - Continue assessment No - Continue assessment No - Continue assessment    What is the highest  level of mobility based on the progressive mobility assessment? Level 5 (Walks with assist in room/hall) - Balance while stepping forward/back and can walk in room with assist - Complete Level 5 (Walks with assist in room/hall) - Balance while stepping forward/back and can walk in room with assist - Complete Level 5 (Walks with assist in room/hall) - Balance while stepping forward/back and can walk in room with assist - Complete Level 4 (Walks with assist in room) - Balance while marching in place and cannot step forward and back - Complete             Barriers to discharge: none Disposition Plan:  Home in 2-3 days Status is: Inpatient  Objective: Blood pressure (!) 110/57, pulse 82, temperature 97.7 F (36.5 C), temperature source Oral, resp. rate 16, height '5\' 11"'$  (1.803 m), weight 82.9 kg, SpO2 100 %.  Examination:  Physical Exam Constitutional:      General: He is not in acute distress.    Comments: Fatigued appearing  HENT:     Head: Normocephalic and atraumatic.     Mouth/Throat:     Mouth: Mucous membranes are moist.  Eyes:     Extraocular Movements: Extraocular movements intact.  Cardiovascular:     Rate and Rhythm: Normal rate and regular rhythm.  Pulmonary:     Effort: Pulmonary effort is normal.     Breath sounds: Normal  breath sounds.  Abdominal:     General: Bowel sounds are normal. There is distension.     Palpations: Abdomen is soft.     Tenderness: There is no abdominal tenderness. There is no guarding or rebound.  Musculoskeletal:        General: Normal range of motion.     Cervical back: Normal range of motion and neck supple.  Skin:    General: Skin is warm and dry.  Neurological:     General: No focal deficit present.     Mental Status: He is alert.  Psychiatric:        Mood and Affect: Mood normal.      Consultants:  Oncology  Procedures:    Data Reviewed: Results for orders placed or performed during the hospital encounter of 12/14/21 (from  the past 24 hour(s))  Gastrointestinal Panel by PCR , Stool     Status: None   Collection Time: 12/15/21  2:50 PM   Specimen: Stool  Result Value Ref Range   Campylobacter species NOT DETECTED NOT DETECTED   Plesimonas shigelloides NOT DETECTED NOT DETECTED   Salmonella species NOT DETECTED NOT DETECTED   Yersinia enterocolitica NOT DETECTED NOT DETECTED   Vibrio species NOT DETECTED NOT DETECTED   Vibrio cholerae NOT DETECTED NOT DETECTED   Enteroaggregative E coli (EAEC) NOT DETECTED NOT DETECTED   Enteropathogenic E coli (EPEC) NOT DETECTED NOT DETECTED   Enterotoxigenic E coli (ETEC) NOT DETECTED NOT DETECTED   Shiga like toxin producing E coli (STEC) NOT DETECTED NOT DETECTED   Shigella/Enteroinvasive E coli (EIEC) NOT DETECTED NOT DETECTED   Cryptosporidium NOT DETECTED NOT DETECTED   Cyclospora cayetanensis NOT DETECTED NOT DETECTED   Entamoeba histolytica NOT DETECTED NOT DETECTED   Giardia lamblia NOT DETECTED NOT DETECTED   Adenovirus F40/41 NOT DETECTED NOT DETECTED   Astrovirus NOT DETECTED NOT DETECTED   Norovirus GI/GII NOT DETECTED NOT DETECTED   Rotavirus A NOT DETECTED NOT DETECTED   Sapovirus (I, II, IV, and V) NOT DETECTED NOT DETECTED  Basic metabolic panel     Status: Abnormal   Collection Time: 12/16/21  6:07 AM  Result Value Ref Range   Sodium 135 135 - 145 mmol/L   Potassium 3.7 3.5 - 5.1 mmol/L   Chloride 113 (H) 98 - 111 mmol/L   CO2 16 (L) 22 - 32 mmol/L   Glucose, Bld 114 (H) 70 - 99 mg/dL   BUN 17 8 - 23 mg/dL   Creatinine, Ser 1.31 (H) 0.61 - 1.24 mg/dL   Calcium 7.8 (L) 8.9 - 10.3 mg/dL   GFR, Estimated 54 (L) >60 mL/min   Anion gap 6 5 - 15  CBC with Differential/Platelet     Status: Abnormal   Collection Time: 12/16/21  6:07 AM  Result Value Ref Range   WBC 2.6 (L) 4.0 - 10.5 K/uL   RBC 2.71 (L) 4.22 - 5.81 MIL/uL   Hemoglobin 8.2 (L) 13.0 - 17.0 g/dL   HCT 25.9 (L) 39.0 - 52.0 %   MCV 95.6 80.0 - 100.0 fL   MCH 30.3 26.0 - 34.0 pg    MCHC 31.7 30.0 - 36.0 g/dL   RDW 17.6 (H) 11.5 - 15.5 %   Platelets 102 (L) 150 - 400 K/uL   nRBC 0.8 (H) 0.0 - 0.2 %   Neutrophils Relative % 31 %   Neutro Abs 0.8 (L) 1.7 - 7.7 K/uL   Lymphocytes Relative 35 %   Lymphs Abs 0.9 0.7 -  4.0 K/uL   Monocytes Relative 33 %   Monocytes Absolute 0.9 0.1 - 1.0 K/uL   Eosinophils Relative 0 %   Eosinophils Absolute 0.0 0.0 - 0.5 K/uL   Basophils Relative 1 %   Basophils Absolute 0.0 0.0 - 0.1 K/uL   Immature Granulocytes 0 %   Abs Immature Granulocytes 0.00 0.00 - 0.07 K/uL  Magnesium     Status: None   Collection Time: 12/16/21  6:07 AM  Result Value Ref Range   Magnesium 2.0 1.7 - 2.4 mg/dL    I have Reviewed nursing notes, Vitals, and Lab results since pt's last encounter. Pertinent lab results : see above I have ordered test including BMP, CBC, Mg I have reviewed the last note from staff over past 24 hours I have discussed pt's care plan and test results with nursing staff, case manager   LOS: 1 day   Dwyane Dee, MD [facility] 12/16/2021, 2:19 PM

## 2021-12-16 NOTE — Progress Notes (Signed)
Randy Wood   DOB:Feb 22, 1941   EX#:[id number]   [phone]  Oncology follow up   Subjective: Patient is well-known to me, under my care for his metastatic esophageal cancer.  He was last seen in my office on December 02, 2021 received chemo Taxol and ramucirumab.  Plan is to emergency room with progressive weakness, chills, no fever, and dyspnea on exertion.  He was found to have E. coli bacteremia.  He remains hemodynamically stable.   Objective:  Vitals:   12/15/21 2048 12/16/21 0528  BP: (!) 121/57 (!) 111/56  Pulse: 92 80  Resp: 18 18  Temp: 99.1 F (37.3 C) 98.2 F (36.8 C)  SpO2: 100% 100%    Body mass index is 25.49 kg/m.  Intake/Output Summary (Last 24 hours) at 12/16/2021 0928 Last data filed at 12/16/2021 0500 Gross per 24 hour  Intake 340 ml  Output 1100 ml  Net -760 ml     Sclerae unicteric  Oropharynx clear  No peripheral adenopathy  Lungs clear -- no rales or rhonchi  Heart regular rate and rhythm  Abdomen benign  MSK no focal spinal tenderness, no peripheral edema  Neuro nonfocal    CBG (last 3)  No results for input(s): "GLUCAP" in the last 72 hours.   Labs:   Urine Studies No results for input(s): "UHGB", "CRYS" in the last 72 hours.  Invalid input(s): "UACOL", "UAPR", "USPG", "UPH", "UTP", "UGL", "UKET", "UBIL", "UNIT", "UROB", "ULEU", "UEPI", "UWBC", "URBC", "UBAC", "CAST", "UCOM", "BILUA"  Basic Metabolic Panel: Recent Labs  Lab 12/14/21 1810 12/15/21 0513 12/16/21 0607  NA 134* 134* 135  K 4.5 4.1 3.7  CL 111 113* 113*  CO2 17* 16* 16*  GLUCOSE 93 88 114*  BUN '16 15 17  '$ CREATININE 1.21 1.12 1.31*  CALCIUM 8.1* 7.6* 7.8*  MG  --   --  2.0   GFR Estimated Creatinine Clearance: 46.3 mL/min (A) (by C-G formula based on SCr of 1.31 mg/dL (H)). Liver Function Tests: Recent Labs  Lab 12/14/21 1810 12/15/21 0513  AST 30 26  ALT 18 15  ALKPHOS 71 59  BILITOT 1.3* 1.4*  PROT 5.6* 4.6*  ALBUMIN 3.0* 2.5*   No results for  input(s): "LIPASE", "AMYLASE" in the last 168 hours. No results for input(s): "AMMONIA" in the last 168 hours. Coagulation profile No results for input(s): "INR", "PROTIME" in the last 168 hours.  CBC: Recent Labs  Lab 12/14/21 1810 12/15/21 0513 12/16/21 0607  WBC 0.9* 1.1* 2.6*  NEUTROABS 0.1* 0.1* 0.8*  HGB 7.3* 7.9* 8.2*  HCT 22.9* 24.1* 25.9*  MCV 97.0 94.9 95.6  PLT 123* 100* 102*   Cardiac Enzymes: No results for input(s): "CKTOTAL", "CKMB", "CKMBINDEX", "TROPONINI" in the last 168 hours. BNP: Invalid input(s): "POCBNP" CBG: No results for input(s): "GLUCAP" in the last 168 hours. D-Dimer Recent Labs    12/14/21 1810  DDIMER 1.92*   Hgb A1c No results for input(s): "HGBA1C" in the last 72 hours. Lipid Profile No results for input(s): "CHOL", "HDL", "LDLCALC", "TRIG", "CHOLHDL", "LDLDIRECT" in the last 72 hours. Thyroid function studies Recent Labs    12/14/21 2210  TSH 49.104*   Anemia work up Recent Labs    12/14/21 2210  VITAMINB12 1,117*  FOLATE 23.5  FERRITIN 47  TIBC 239*  IRON 24*   Microbiology Recent Results (from the past 240 hour(s))  Blood culture (routine x 2)     Status: Abnormal (Preliminary result)   Collection Time: 12/14/21  6:33 PM  Specimen: BLOOD  Result Value Ref Range Status   Specimen Description   Final    BLOOD SITE NOT SPECIFIED Performed at [facility] [address], [place], [place] [address]    Special Requests   Final    BOTTLES DRAWN AEROBIC AND ANAEROBIC Blood Culture adequate volume Performed at [facility] [address], [place], [place] [address]    Culture  Setup Time   Final    GRAM NEGATIVE RODS ANAEROBIC BOTTLE ONLY CRITICAL RESULT CALLED TO, READ BACK BY AND VERIFIED WITH: PHARMD A PHAM [id number] AT 1059 AM BY CM Performed at [facility], [address] [place], [place] [address]    Culture ESCHERICHIA COLI (A)  Final   Report Status PENDING   Incomplete  Blood Culture ID Panel (Reflexed)     Status: Abnormal   Collection Time: 12/14/21  6:33 PM  Result Value Ref Range Status   Enterococcus faecalis NOT DETECTED NOT DETECTED Final   Enterococcus Faecium NOT DETECTED NOT DETECTED Final   Listeria monocytogenes NOT DETECTED NOT DETECTED Final   Staphylococcus species NOT DETECTED NOT DETECTED Final   Staphylococcus aureus (BCID) NOT DETECTED NOT DETECTED Final   Staphylococcus epidermidis NOT DETECTED NOT DETECTED Final   Staphylococcus lugdunensis NOT DETECTED NOT DETECTED Final   Streptococcus species NOT DETECTED NOT DETECTED Final   Streptococcus agalactiae NOT DETECTED NOT DETECTED Final   Streptococcus pneumoniae NOT DETECTED NOT DETECTED Final   Streptococcus pyogenes NOT DETECTED NOT DETECTED Final   A.calcoaceticus-baumannii NOT DETECTED NOT DETECTED Final   Bacteroides fragilis NOT DETECTED NOT DETECTED Final   Enterobacterales DETECTED (A) NOT DETECTED Final    Comment: Enterobacterales represent a large order of gram negative bacteria, not a single organism. CRITICAL RESULT CALLED TO, READ BACK BY AND VERIFIED WITH: PHARMD A PHAM [id number] AT 7322 AM BY CM    Enterobacter cloacae complex NOT DETECTED NOT DETECTED Final   Escherichia coli DETECTED (A) NOT DETECTED Final    Comment: CRITICAL RESULT CALLED TO, READ BACK BY AND VERIFIED WITH: PHARMD A PHAM [phone] AT 1059 BY CM    Klebsiella aerogenes NOT DETECTED NOT DETECTED Final   Klebsiella oxytoca NOT DETECTED NOT DETECTED Final   Klebsiella pneumoniae NOT DETECTED NOT DETECTED Final   Proteus species NOT DETECTED NOT DETECTED Final   Salmonella species NOT DETECTED NOT DETECTED Final   Serratia marcescens NOT DETECTED NOT DETECTED Final   Haemophilus influenzae NOT DETECTED NOT DETECTED Final   Neisseria meningitidis NOT DETECTED NOT DETECTED Final   Pseudomonas aeruginosa NOT DETECTED NOT DETECTED Final   Stenotrophomonas maltophilia NOT DETECTED NOT DETECTED  Final   Candida albicans NOT DETECTED NOT DETECTED Final   Candida auris NOT DETECTED NOT DETECTED Final   Candida glabrata NOT DETECTED NOT DETECTED Final   Candida krusei NOT DETECTED NOT DETECTED Final   Candida parapsilosis NOT DETECTED NOT DETECTED Final   Candida tropicalis NOT DETECTED NOT DETECTED Final   Cryptococcus neoformans/gattii NOT DETECTED NOT DETECTED Final   CTX-M ESBL NOT DETECTED NOT DETECTED Final   Carbapenem resistance IMP NOT DETECTED NOT DETECTED Final   Carbapenem resistance KPC NOT DETECTED NOT DETECTED Final   Carbapenem resistance NDM NOT DETECTED NOT DETECTED Final   Carbapenem resist OXA 48 LIKE NOT DETECTED NOT DETECTED Final   Carbapenem resistance VIM NOT DETECTED NOT DETECTED Final    Comment: Performed at [facility], [address], [place],  [address]  Blood  culture (routine x 2)     Status: None (Preliminary result)   Collection Time: 12/14/21  8:45 PM   Specimen: BLOOD LEFT FOREARM  Result Value Ref Range Status   Specimen Description   Final    BLOOD LEFT FOREARM Performed at [facility] [address], [place], [place] [address]    Special Requests   Final    BOTTLES DRAWN AEROBIC AND ANAEROBIC Blood Culture results may not be optimal due to an inadequate volume of blood received in culture bottles Performed at [facility] [address], [place], [place] [address]    Culture   Final    NO GROWTH 1 DAY Performed at [facility], [place] [address], [place], [place] [address]    Report Status PENDING  Incomplete      Studies:  CT Angio Chest PE W and/or Wo Contrast  Result Date: 12/14/2021 CLINICAL DATA:  Increased weakness over the last several days. History of esophageal cancer. Concern for pulmonary embolism. EXAM: CT ANGIOGRAPHY CHEST WITH CONTRAST TECHNIQUE: Multidetector CT imaging of the chest was performed using the standard protocol during bolus administration of  intravenous contrast. Multiplanar CT image reconstructions and MIPs were obtained to evaluate the vascular anatomy. RADIATION DOSE REDUCTION: This exam was performed according to the departmental dose-optimization program which includes automated exposure control, adjustment of the mA and/or kV according to patient size and/or use of iterative reconstruction technique. CONTRAST:  75m OMNIPAQUE IOHEXOL 350 MG/ML SOLN COMPARISON:  CT chest dated 10/21/2021. FINDINGS: Cardiovascular: Satisfactory opacification of the pulmonary arteries to the segmental level. No evidence of pulmonary embolism. The right and left pulmonary arteries are enlarged, measuring 2.6 cm and 2.3 cm respectively, suggestive of pulmonary hypertension. Vascular calcifications are seen in the coronary arteries and aortic arch. Normal heart size. There is a small pericardial effusion. A right internal jugular central venous port catheter terminates in the superior vena cava. Mediastinum/Nodes: No enlarged mediastinal, hilar, or axillary lymph nodes. Thyroid gland, trachea, and esophagus demonstrate no significant findings. Lungs/Pleura: There is a moderate right and small left pleural effusion with associated atelectasis. These are similar to 10/21/2021. No pneumothorax. No suspicious pulmonary nodules. Upper Abdomen: Ascites is partially imaged. Musculoskeletal: Degenerative changes are seen in the spine. Review of the MIP images confirms the above findings. IMPRESSION: 1. No evidence of acute pulmonary embolism. 2. Moderate right and small left pleural effusions with associated atelectasis appear similar to 10/21/2021. 3. Findings suggestive of pulmonary hypertension. Small pericardial effusion. 4. No evidence of progression of the patient's known esophageal cancer. 5. Ascites is partially imaged. Aortic Atherosclerosis (ICD10-I70.0). Electronically Signed   By: TZerita BoersM.D.   On: 12/14/2021 19:54   DG ABD ACUTE 2+V W 1V CHEST  Result  Date: 12/14/2021 CLINICAL DATA:  Short of breath.  Weakness and fatigue. EXAM: DG ABDOMEN ACUTE WITH 1 VIEW CHEST COMPARISON:  CT 10/21/2021. FINDINGS: In the left lower quadrant of the abdomen there are dilated small bowel loops measure up to 5 cm. Colonic gas is present up to the level of the rectum. No signs of pneumoperitoneum. Heart size is normal. There is no pleural effusion or edema. No airspace opacities identified. IMPRESSION: Dilated small bowel loops in the left lower quadrant of the abdomen with gas noted in the colon up to the rectum. Cannot exclude partial small bowel obstruction. Electronically Signed   By: TKerby MoorsM.D.   On: 12/14/2021 17:59    Assessment: 81y.o.  male   1.  E. coli bacteremia 2.  Urinary retention and UTI 3.  Metastatic esophageal cancer, on chemotherapy 4.  Severe neutropenia 5.  Iron deficient anemia 6. Liver cirrhosis  7. Thrombocytopenia   Plan:  -He is on appropriate antibiotics, managed by primary team -Given his severe neutropenia, and sepsis, I started him on Granix this morning, will continue daily until [place] above 1.5 -Hold on chemotherapy this week -Agree with blood transfusion to keep hemoglobin above 7.5 -Consider IV iron before discharge -I will f/u as needed. -discussed with Dr. Marveen Reeks, MD 12/15/2021

## 2021-12-16 NOTE — Progress Notes (Signed)
Mobility Specialist Cancellation/Refusal Note:    12/16/21 1138  Mobility  Activity Refused mobility     Reason for Cancellation/Refusal: Pt declined mobility at this time. Pt feeling nauseous. Will check back as schedule permits.      [facility]

## 2021-12-16 NOTE — Telephone Encounter (Signed)
Dr. Myles Gip would like a call to discuss patient thyroid medication. [phone].

## 2021-12-16 NOTE — Plan of Care (Signed)
  Problem: Activity: Goal: Risk for activity intolerance will decrease Outcome: Not Progressing   Problem: Nutrition: Goal: Adequate nutrition will be maintained Outcome: Not Progressing   Problem: Education: Goal: Knowledge of General Education information will improve Description: Including pain rating scale, medication(s)/side effects and non-pharmacologic comfort measures Outcome: Progressing   Problem: Health Behavior/Discharge Planning: Goal: Ability to manage health-related needs will improve Outcome: Progressing   Problem: Clinical Measurements: Goal: Ability to maintain clinical measurements within normal limits will improve Outcome: Progressing Goal: Will remain free from infection Outcome: Progressing Goal: Diagnostic test results will improve Outcome: Progressing Goal: Respiratory complications will improve Outcome: Progressing Goal: Cardiovascular complication will be avoided Outcome: Progressing   Problem: Coping: Goal: Level of anxiety will decrease Outcome: Progressing   Problem: Elimination: Goal: Will not experience complications related to bowel motility Outcome: Progressing Goal: Will not experience complications related to urinary retention Outcome: Progressing   Problem: Pain Managment: Goal: General experience of comfort will improve Outcome: Progressing   Problem: Safety: Goal: Ability to remain free from injury will improve Outcome: Progressing   Problem: Skin Integrity: Goal: Risk for impaired skin integrity will decrease Outcome: Progressing   

## 2021-12-16 NOTE — Telephone Encounter (Signed)
Attempted to call on the listed number on 12/16/2021 at 10:22 AM   Left a message to communicate through epic if possible as that is the quickest way and I will be happy to answer any questions    I have sent an epic message    Abby Nena Jordan, MD  [facility] Endocrinology  [facility] [address] [place], [place] [address] Phone: [phone] FAX: [phone]

## 2021-12-17 ENCOUNTER — Inpatient Hospital Stay (HOSPITAL_COMMUNITY): Payer: Medicare Other

## 2021-12-17 DIAGNOSIS — E872 Acidosis, unspecified: Secondary | ICD-10-CM

## 2021-12-17 DIAGNOSIS — E7800 Pure hypercholesterolemia, unspecified: Secondary | ICD-10-CM

## 2021-12-17 DIAGNOSIS — R140 Abdominal distension (gaseous): Secondary | ICD-10-CM | POA: Diagnosis not present

## 2021-12-17 DIAGNOSIS — R7881 Bacteremia: Secondary | ICD-10-CM | POA: Diagnosis not present

## 2021-12-17 DIAGNOSIS — C159 Malignant neoplasm of esophagus, unspecified: Secondary | ICD-10-CM | POA: Diagnosis not present

## 2021-12-17 DIAGNOSIS — B9620 Unspecified Escherichia coli [E. coli] as the cause of diseases classified elsewhere: Secondary | ICD-10-CM | POA: Diagnosis not present

## 2021-12-17 DIAGNOSIS — R338 Other retention of urine: Secondary | ICD-10-CM | POA: Diagnosis not present

## 2021-12-17 LAB — BASIC METABOLIC PANEL
Anion gap: 6 (ref 5–15)
BUN: 14 mg/dL (ref 8–23)
CO2: 18 mmol/L — ABNORMAL LOW (ref 22–32)
Calcium: 7.6 mg/dL — ABNORMAL LOW (ref 8.9–10.3)
Chloride: 111 mmol/L (ref 98–111)
Creatinine, Ser: 1.01 mg/dL (ref 0.61–1.24)
GFR, Estimated: >60 mL/min (ref >60)
Glucose, Bld: 107 mg/dL — ABNORMAL HIGH (ref 70–99)
Potassium: 3.5 mmol/L (ref 3.5–5.1)
Sodium: 135 mmol/L (ref 135–145)

## 2021-12-17 LAB — CBC WITH DIFFERENTIAL/PLATELET
Abs Immature Granulocytes: 0.11 10*3/uL — ABNORMAL HIGH (ref 0.00–0.07)
Basophils Absolute: 0.1 10*3/uL (ref 0.0–0.1)
Basophils Relative: 1 %
Eosinophils Absolute: 0 10*3/uL (ref 0.0–0.5)
Eosinophils Relative: 1 %
HCT: 24.2 % — ABNORMAL LOW (ref 39.0–52.0)
Hemoglobin: 7.7 g/dL — ABNORMAL LOW (ref 13.0–17.0)
Immature Granulocytes: 2 %
Lymphocytes Relative: 21 %
Lymphs Abs: 1.4 10*3/uL (ref 0.7–4.0)
MCH: 30.2 pg (ref 26.0–34.0)
MCHC: 31.8 g/dL (ref 30.0–36.0)
MCV: 94.9 fL (ref 80.0–100.0)
Monocytes Absolute: 1.1 10*3/uL — ABNORMAL HIGH (ref 0.1–1.0)
Monocytes Relative: 16 %
Neutro Abs: 4.1 10*3/uL (ref 1.7–7.7)
Neutrophils Relative %: 59 %
Platelets: 104 10*3/uL — ABNORMAL LOW (ref 150–400)
RBC: 2.55 MIL/uL — ABNORMAL LOW (ref 4.22–5.81)
RDW: 17.9 % — ABNORMAL HIGH (ref 11.5–15.5)
WBC: 6.7 10*3/uL (ref 4.0–10.5)
nRBC: 0.4 % — ABNORMAL HIGH (ref 0.0–0.2)

## 2021-12-17 LAB — CULTURE, BLOOD (ROUTINE X 2): Special Requests: ADEQUATE

## 2021-12-17 LAB — MAGNESIUM: Magnesium: 1.7 mg/dL (ref 1.7–2.4)

## 2021-12-17 MED ORDER — SODIUM CHLORIDE 0.9 % IV SOLN
125.0000 mg | Freq: Once | INTRAVENOUS | Status: AC
  Administered 2021-12-17: 125 mg via INTRAVENOUS
  Filled 2021-12-17: qty 10

## 2021-12-17 MED ORDER — CEFAZOLIN SODIUM-DEXTROSE 2-4 GM/100ML-% IV SOLN
2.0000 g | Freq: Three times a day (TID) | INTRAVENOUS | Status: DC
  Administered 2021-12-17 – 2021-12-18 (×4): 2 g via INTRAVENOUS
  Filled 2021-12-17 (×5): qty 100

## 2021-12-17 MED ORDER — CHLORHEXIDINE GLUCONATE CLOTH 2 % EX PADS
6.0000 | MEDICATED_PAD | Freq: Every day | CUTANEOUS | Status: DC
  Administered 2021-12-17: 6 via TOPICAL

## 2021-12-17 MED FILL — Dexamethasone Sodium Phosphate Inj 100 MG/10ML: INTRAMUSCULAR | Qty: 1 | Status: AC

## 2021-12-17 NOTE — Assessment & Plan Note (Signed)
See above

## 2021-12-17 NOTE — Assessment & Plan Note (Signed)
Complicates treatment with lactulose.  Improving in the last few days with treatment of infection.

## 2021-12-17 NOTE — Progress Notes (Signed)
Randy Wood   DOB:1940/09/12   ZO#:[id number]   [phone]  Oncology follow up   Subjective: pt is overall feeling better, but still very fatigued. He was getting iv iron when I saw him. Plan to go home tomorrow.    Objective:  Vitals:   12/17/21 0417 12/17/21 1336  BP: (!) 103/57 106/63  Pulse: 73 73  Resp: 18 16  Temp: 98 F (36.7 C) 97.7 F (36.5 C)  SpO2: 100% 100%    Body mass index is 25.49 kg/m.  Intake/Output Summary (Last 24 hours) at 12/17/2021 2222 Last data filed at 12/17/2021 2042 Gross per 24 hour  Intake 697.77 ml  Output 670 ml  Net 27.77 ml     Sclerae unicteric  Oropharynx clear  No peripheral adenopathy  Lungs clear -- no rales or rhonchi  Heart regular rate and rhythm  Abdomen distended, (+) ascites    MSK no focal spinal tenderness, no peripheral edema  Neuro nonfocal    CBG (last 3)  No results for input(s): "GLUCAP" in the last 72 hours.   Labs:   Urine Studies No results for input(s): "UHGB", "CRYS" in the last 72 hours.  Invalid input(s): "UACOL", "UAPR", "USPG", "UPH", "UTP", "UGL", "UKET", "UBIL", "UNIT", "UROB", "ULEU", "UEPI", "UWBC", "URBC", "UBAC", "CAST", "UCOM", "BILUA"  Basic Metabolic Panel: Recent Labs  Lab 12/14/21 1810 12/15/21 0513 12/16/21 0607 12/17/21 0539  NA 134* 134* 135 135  K 4.5 4.1 3.7 3.5  CL 111 113* 113* 111  CO2 17* 16* 16* 18*  GLUCOSE 93 88 114* 107*  BUN '16 15 17 14  '$ CREATININE 1.21 1.12 1.31* 1.01  CALCIUM 8.1* 7.6* 7.8* 7.6*  MG  --   --  2.0 1.7   GFR Estimated Creatinine Clearance: 60.1 mL/min (by C-G formula based on SCr of 1.01 mg/dL). Liver Function Tests: Recent Labs  Lab 12/14/21 1810 12/15/21 0513  AST 30 26  ALT 18 15  ALKPHOS 71 59  BILITOT 1.3* 1.4*  PROT 5.6* 4.6*  ALBUMIN 3.0* 2.5*   No results for input(s): "LIPASE", "AMYLASE" in the last 168 hours. No results for input(s): "AMMONIA" in the last 168 hours. Coagulation profile No results for input(s): "INR",  "PROTIME" in the last 168 hours.  CBC: Recent Labs  Lab 12/14/21 1810 12/15/21 0513 12/16/21 0607 12/17/21 0539  WBC 0.9* 1.1* 2.6* 6.7  NEUTROABS 0.1* 0.1* 0.8* 4.1  HGB 7.3* 7.9* 8.2* 7.7*  HCT 22.9* 24.1* 25.9* 24.2*  MCV 97.0 94.9 95.6 94.9  PLT 123* 100* 102* 104*   Cardiac Enzymes: No results for input(s): "CKTOTAL", "CKMB", "CKMBINDEX", "TROPONINI" in the last 168 hours. BNP: Invalid input(s): "POCBNP" CBG: No results for input(s): "GLUCAP" in the last 168 hours. D-Dimer No results for input(s): "DDIMER" in the last 72 hours.  Hgb A1c No results for input(s): "HGBA1C" in the last 72 hours. Lipid Profile No results for input(s): "CHOL", "HDL", "LDLCALC", "TRIG", "CHOLHDL", "LDLDIRECT" in the last 72 hours. Thyroid function studies No results for input(s): "TSH", "T4TOTAL", "T3FREE", "THYROIDAB" in the last 72 hours.  Invalid input(s): "FREET3"  Anemia work up No results for input(s): "VITAMINB12", "FOLATE", "FERRITIN", "TIBC", "IRON", "RETICCTPCT" in the last 72 hours.  Microbiology Recent Results (from the past 240 hour(s))  Blood culture (routine x 2)     Status: Abnormal   Collection Time: 12/14/21  6:33 PM   Specimen: BLOOD  Result Value Ref Range Status   Specimen Description   Final    BLOOD SITE NOT  SPECIFIED Performed at [facility], [place] [address] [place], [place] [address]    Special Requests   Final    BOTTLES DRAWN AEROBIC AND ANAEROBIC Blood Culture adequate volume Performed at [facility] [address] [place], [place] [address]    Culture  Setup Time   Final    GRAM NEGATIVE RODS ANAEROBIC BOTTLE ONLY CRITICAL RESULT CALLED TO, READ BACK BY AND VERIFIED WITH: PHARMD A PHAM [phone] AT 1059 AM BY CM Performed at [facility], [address] [place], [place] [address]    Culture ESCHERICHIA COLI (A)  Final   Report Status 12/17/2021 FINAL  Final   Organism ID, Bacteria ESCHERICHIA COLI   Final      Susceptibility   Escherichia coli - MIC*    AMPICILLIN 4 SENSITIVE Sensitive     CEFAZOLIN <=4 SENSITIVE Sensitive     CEFEPIME <=0.12 SENSITIVE Sensitive     CEFTAZIDIME <=1 SENSITIVE Sensitive     CEFTRIAXONE <=0.25 SENSITIVE Sensitive     CIPROFLOXACIN <=0.25 SENSITIVE Sensitive     GENTAMICIN <=1 SENSITIVE Sensitive     IMIPENEM <=0.25 SENSITIVE Sensitive     TRIMETH/SULFA <=20 SENSITIVE Sensitive     AMPICILLIN/SULBACTAM <=2 SENSITIVE Sensitive     PIP/TAZO <=4 SENSITIVE Sensitive     * ESCHERICHIA COLI  Blood Culture ID Panel (Reflexed)     Status: Abnormal   Collection Time: 12/14/21  6:33 PM  Result Value Ref Range Status   Enterococcus faecalis NOT DETECTED NOT DETECTED Final   Enterococcus Faecium NOT DETECTED NOT DETECTED Final   Listeria monocytogenes NOT DETECTED NOT DETECTED Final   Staphylococcus species NOT DETECTED NOT DETECTED Final   Staphylococcus aureus (BCID) NOT DETECTED NOT DETECTED Final   Staphylococcus epidermidis NOT DETECTED NOT DETECTED Final   Staphylococcus lugdunensis NOT DETECTED NOT DETECTED Final   Streptococcus species NOT DETECTED NOT DETECTED Final   Streptococcus agalactiae NOT DETECTED NOT DETECTED Final   Streptococcus pneumoniae NOT DETECTED NOT DETECTED Final   Streptococcus pyogenes NOT DETECTED NOT DETECTED Final   A.calcoaceticus-baumannii NOT DETECTED NOT DETECTED Final   Bacteroides fragilis NOT DETECTED NOT DETECTED Final   Enterobacterales DETECTED (A) NOT DETECTED Final    Comment: Enterobacterales represent a large order of gram negative bacteria, not a single organism. CRITICAL RESULT CALLED TO, READ BACK BY AND VERIFIED WITH: PHARMD A PHAM [id number] AT 6789 AM BY CM    Enterobacter cloacae complex NOT DETECTED NOT DETECTED Final   Escherichia coli DETECTED (A) NOT DETECTED Final    Comment: CRITICAL RESULT CALLED TO, READ BACK BY AND VERIFIED WITH: PHARMD A PHAM [id number] AT 1059 BY CM    Klebsiella aerogenes NOT  DETECTED NOT DETECTED Final   Klebsiella oxytoca NOT DETECTED NOT DETECTED Final   Klebsiella pneumoniae NOT DETECTED NOT DETECTED Final   Proteus species NOT DETECTED NOT DETECTED Final   Salmonella species NOT DETECTED NOT DETECTED Final   Serratia marcescens NOT DETECTED NOT DETECTED Final   Haemophilus influenzae NOT DETECTED NOT DETECTED Final   Neisseria meningitidis NOT DETECTED NOT DETECTED Final   Pseudomonas aeruginosa NOT DETECTED NOT DETECTED Final   Stenotrophomonas maltophilia NOT DETECTED NOT DETECTED Final   Candida albicans NOT DETECTED NOT DETECTED Final   Candida auris NOT DETECTED NOT DETECTED Final   Candida glabrata NOT DETECTED NOT DETECTED Final   Candida krusei NOT DETECTED NOT DETECTED Final   Candida parapsilosis NOT DETECTED NOT DETECTED Final   Candida  tropicalis NOT DETECTED NOT DETECTED Final   Cryptococcus neoformans/gattii NOT DETECTED NOT DETECTED Final   CTX-M ESBL NOT DETECTED NOT DETECTED Final   Carbapenem resistance IMP NOT DETECTED NOT DETECTED Final   Carbapenem resistance KPC NOT DETECTED NOT DETECTED Final   Carbapenem resistance NDM NOT DETECTED NOT DETECTED Final   Carbapenem resist OXA 48 LIKE NOT DETECTED NOT DETECTED Final   Carbapenem resistance VIM NOT DETECTED NOT DETECTED Final    Comment: Performed at [facility], [place] [address], [place], [place] [address]  Blood culture (routine x 2)     Status: None (Preliminary result)   Collection Time: 12/14/21  8:45 PM   Specimen: BLOOD LEFT FOREARM  Result Value Ref Range Status   Specimen Description   Final    BLOOD LEFT FOREARM Performed at [facility] [address] [place], [place] [address]    Special Requests   Final    BOTTLES DRAWN AEROBIC AND ANAEROBIC Blood Culture results may not be optimal due to an inadequate volume of blood received in culture bottles Performed at [facility] [address], [place], [place] [address]     Culture   Final    NO GROWTH 2 DAYS Performed at [facility] [address] [place], [place] [address]    Report Status PENDING  Incomplete  Gastrointestinal Panel by PCR , Stool     Status: None   Collection Time: 12/15/21  2:50 PM   Specimen: Stool  Result Value Ref Range Status   Campylobacter species NOT DETECTED NOT DETECTED Final   Plesimonas shigelloides NOT DETECTED NOT DETECTED Final   Salmonella species NOT DETECTED NOT DETECTED Final   Yersinia enterocolitica NOT DETECTED NOT DETECTED Final   Vibrio species NOT DETECTED NOT DETECTED Final   Vibrio cholerae NOT DETECTED NOT DETECTED Final   Enteroaggregative E coli (EAEC) NOT DETECTED NOT DETECTED Final   Enteropathogenic E coli (EPEC) NOT DETECTED NOT DETECTED Final   Enterotoxigenic E coli (ETEC) NOT DETECTED NOT DETECTED Final   Shiga like toxin producing E coli (STEC) NOT DETECTED NOT DETECTED Final   Shigella/Enteroinvasive E coli (EIEC) NOT DETECTED NOT DETECTED Final   Cryptosporidium NOT DETECTED NOT DETECTED Final   Cyclospora cayetanensis NOT DETECTED NOT DETECTED Final   Entamoeba histolytica NOT DETECTED NOT DETECTED Final   Giardia lamblia NOT DETECTED NOT DETECTED Final   Adenovirus F40/41 NOT DETECTED NOT DETECTED Final   Astrovirus NOT DETECTED NOT DETECTED Final   Norovirus GI/GII NOT DETECTED NOT DETECTED Final   Rotavirus A NOT DETECTED NOT DETECTED Final   Sapovirus (I, II, IV, and V) NOT DETECTED NOT DETECTED Final    Comment: Performed at [facility], [address] [place], [place] [address]  Culture, blood (Routine X 2) w Reflex to ID Panel     Status: None (Preliminary result)   Collection Time: 12/16/21  6:07 AM   Specimen: BLOOD  Result Value Ref Range Status   Specimen Description   Final    BLOOD RIGHT ANTECUBITAL Performed at [facility], [place] [address] [place], [place] [address]    Special Requests   Final    BOTTLES DRAWN AEROBIC ONLY Blood  Culture adequate volume Performed at [facility] [address], [place], [place] [address]    Culture   Final    NO GROWTH 1 DAY Performed at [facility], [place] [address] [place], [place]  [address]    Report Status PENDING  Incomplete  Culture, blood (Routine X 2) w Reflex to ID Panel     Status: None (Preliminary result)   Collection Time: 12/16/21  6:07 AM   Specimen: BLOOD  Result Value Ref Range Status   Specimen Description   Final    BLOOD RIGHT ANTECUBITAL Performed at [facility] [address] [place], [place] [address]    Special Requests   Final    BOTTLES DRAWN AEROBIC AND ANAEROBIC Blood Culture adequate volume Performed at [facility] [address] [place], [place] [address]    Culture   Final    NO GROWTH 1 DAY Performed at [facility], [place] [address] [place], [place] [address]    Report Status PENDING  Incomplete      Studies:  DG Abd Portable 1V  Result Date: 12/17/2021 CLINICAL DATA:  [phone] with abdominal distention. EXAM: PORTABLE ABDOMEN - 1 VIEW COMPARISON:  Similar study yesterday at 11:04 a.m. FINDINGS: 5:05 a.m. There is generalized gas distention of the intestinal tract. There is no pathologic colonic dilatation but small bowel segments are dilated up to 3.6 cm with little if any change. Visceral shadows are stable. There is no supine evidence of free air. No pathologic calcification is seen. IMPRESSION: Generalized small-bowel gas distention with dilated segments up to 3.6 cm. Little if any change is noted. Electronically Signed   By: Telford Nab M.D.   On: 12/17/2021 07:32   DG Abd Portable 1V  Result Date: 12/16/2021 CLINICAL DATA:  Abdominal distension. EXAM: PORTABLE ABDOMEN - 1 VIEW COMPARISON:  December 14, 2021. FINDINGS: Dilated small bowel loops are again noted in left side of abdomen concerning for ileus or distal small bowel obstruction. No definite colonic  dilatation is noted. IMPRESSION: Stable small bowel dilatation is noted concerning for ileus or distal small bowel obstruction. Electronically Signed   By: Marijo Conception M.D.   On: 12/16/2021 14:25    Assessment: 81 y.o. male   1.  E. coli bacteremia 2.  Urinary retention and UTI 3.  Metastatic esophageal cancer, on chemotherapy 4.  Severe neutropenia 5.  Iron deficient anemia 6. Liver cirrhosis  7. Thrombocytopenia  9. New right pleural effusion and ascites   Plan:  -Repeated blood culture yesterday has been negative so far.  He will likely go home with oral antibiotics to complete the treatment course -I noticed that his abdomen is quite distended, partially related to his ascites.  I would suggest a ultrasound tomorrow to see if he needs paracentesis.  He is not very symptomatic from his pleural effusion, I think it is okay to watch for now.  He does have Aldactone at home and plan to restart.  We discussed his ascites may be related to his liver cirrhosis, also malignant ascites is also a possibility.  Please send cytology if he does undergo paracentesis. -I have canceled his chemotherapy for tomorrow, plan to see him back in 2 weeks, if he is ready to restart chemo.  [name], MD 12/15/2021

## 2021-12-17 NOTE — Progress Notes (Addendum)
Progress Note   Patient: Randy Wood VEL:[id number] DOB: 1941-01-25 DOA: 12/14/2021     2 DOS: the patient was seen and examined on 12/17/2021 at 11:03AM      Brief hospital course: Randy Wood is an 81 y.o. M with esophageal CA metastatic to liver and LNs on Taxol/ramucirumab, anemia, hypothyroidism, and HLD who presented with fatigue  In the ER, found to have pancytopenia, ANC 99, abdominal distension and urinary retention.     9/17: Admitted and transfused 1 unit 9/18: Blood cultures growing E coli, started Rocephin 9/19: Granix ordered     Assessment and Plan: * E coli bacteremia 1/4 bottles positive E. Coli. We speculate urinary source.  Pan-sensitive on final culture today. - Narrow to cefazolin - Folow repeat cultures    Symptomatic anemia Hemoglobin 7.3 g/dL on admission.  Received 1 unit PRBC on morning of 12/15/2021.  Hgb stable at 7.7 today - Transfusion threshold 7-7.5 g/dL - Give IV iron today  Severe neutropenia (HCC) Pancytopenia ANC 99 on admission, given Granix, WBC up to 6K today.     Abdominal distension X-ray on admission showed some dilated bowel.  He is still distended, but passing gas, stool, appetite okay, no vomiting.     Acute urinary retention Placed on admission due to PV residual >700.  Acute issues seem better. - Trial of voiding  Iron deficiency anemia Iron studies low.  Improving clinically, safe to give IV iron today. - IV iron once - Continue oral iron  Hypothyroidism Last TSH 52.56 on 09/16/2021.  At that time Synthroid was increased from 50 mcg to 88 mcg daily.  Suspect this may be due to nivolumab.    Here TSH 49 again, also FT4 low at 0.41 and T3 low at 41.  My partner discused with Dr. Kelton Pillar  during hospitalization, increased Synthroid from 88 mcg to 100 mcg daily - Continue levothyroxine - Repeat thyroid studies in 4-6 weeks   Pleural effusion CT shows stable moderate right and small left pleural effusion and small  pericardial effusion. These are chronic and unchanged  - Outpatient follow up  Liver disease, chronic Transaminitis due to mets.   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     Metabolic acidosis Complicates treatment with lactulose.  Improving in the last few days with treatment of infection.  Pericardial effusion  See above  Pancytopenia (HCC) Hgb and Plts no change.  WBC up.  Hypercholesterolemia - Continue Zetia          Subjective: Patient is feeling well.  He has had no fever, no vomiting.  He is passing gas and having bowel movements.  He said no confusion.  His appetite is okay, he is walking a little bit in the hall.     Physical Exam: BP (!) 103/57 (BP Location: Right Arm)   Pulse 73   Temp 98 F (36.7 C) (Oral)   Resp 18   Ht '5\' 11"'$  (1.803 m)   Wt 82.9 kg   SpO2 100%   BMI 25.49 kg/m   Thin elderly adult male, lying in bed, interactive and appropriate RRR, no appreciated murmurs, no peripheral edema, no JVD Respiratory rate normal, lungs clear without rales or wheezes Abdomen distended, but no tension, no ascites, no tenderness to palpation, no guarding Attention normal, affect appropriate, judgment and insight appear normal    Data Reviewed: Communicated with Dr. Morey Hummingbird,  oncology Discussed with pharmacy GI panel negative Abdominal x-ray shows some dilated small bowel, no clear obstruction, no distended colon Blood cultures from 917 growing E. coli in 1 of 2, blood cultures from 9/19 no growth to date Basic metabolic panel shows a mild metabolic acidosis Basic metabolic panel otherwise shows normal creatinine, sodium, potassium, corrected calcium Hemogram shows pancytopenia, but improving white blood cell count  Family Communication: Daughter at the bedside    Disposition: Status is:  Inpatient         Author: Edwin Dada, MD 12/17/2021 1:21 PM  For on call review [email or web address].

## 2021-12-17 NOTE — Progress Notes (Signed)
Mobility Specialist - Progress Note   12/17/21 1533  Mobility  HOB Elevated/Bed Position Self regulated  Activity Ambulated with assistance in hallway  Range of Motion/Exercises Active  Level of Assistance Standby assist, set-up cues, supervision of patient - no hands on  Assistive Device Front wheel walker  Distance Ambulated (ft) 260 ft  Activity Response Tolerated well  Transport method Ambulatory  $Mobility charge 1 Mobility   Pt received in bed and agreeable to mobility. No complaints during ambulation. Pt to bed at EOS with family in room and all necessities in reach.    [facility]

## 2021-12-18 ENCOUNTER — Inpatient Hospital Stay (HOSPITAL_COMMUNITY): Payer: Medicare Other

## 2021-12-18 ENCOUNTER — Inpatient Hospital Stay: Payer: Medicare Other

## 2021-12-18 ENCOUNTER — Inpatient Hospital Stay: Payer: Medicare Other | Admitting: Hematology

## 2021-12-18 DIAGNOSIS — B9620 Unspecified Escherichia coli [E. coli] as the cause of diseases classified elsewhere: Secondary | ICD-10-CM | POA: Diagnosis not present

## 2021-12-18 DIAGNOSIS — R7881 Bacteremia: Secondary | ICD-10-CM | POA: Diagnosis not present

## 2021-12-18 LAB — BASIC METABOLIC PANEL
Anion gap: 5 (ref 5–15)
BUN: 13 mg/dL (ref 8–23)
CO2: 18 mmol/L — ABNORMAL LOW (ref 22–32)
Calcium: 7.8 mg/dL — ABNORMAL LOW (ref 8.9–10.3)
Chloride: 112 mmol/L — ABNORMAL HIGH (ref 98–111)
Creatinine, Ser: 1.13 mg/dL (ref 0.61–1.24)
GFR, Estimated: >60 mL/min (ref >60)
Glucose, Bld: 130 mg/dL — ABNORMAL HIGH (ref 70–99)
Potassium: 3.2 mmol/L — ABNORMAL LOW (ref 3.5–5.1)
Sodium: 135 mmol/L (ref 135–145)

## 2021-12-18 LAB — CBC WITH DIFFERENTIAL/PLATELET
Abs Immature Granulocytes: 0.2 10*3/uL — ABNORMAL HIGH (ref 0.00–0.07)
Basophils Absolute: 0.1 10*3/uL (ref 0.0–0.1)
Basophils Relative: 1 %
Eosinophils Absolute: 0.1 10*3/uL (ref 0.0–0.5)
Eosinophils Relative: 1 %
HCT: 25.1 % — ABNORMAL LOW (ref 39.0–52.0)
Hemoglobin: 8 g/dL — ABNORMAL LOW (ref 13.0–17.0)
Immature Granulocytes: 2 %
Lymphocytes Relative: 13 %
Lymphs Abs: 1.3 10*3/uL (ref 0.7–4.0)
MCH: 30.3 pg (ref 26.0–34.0)
MCHC: 31.9 g/dL (ref 30.0–36.0)
MCV: 95.1 fL (ref 80.0–100.0)
Monocytes Absolute: 1 10*3/uL (ref 0.1–1.0)
Monocytes Relative: 10 %
Neutro Abs: 7 10*3/uL (ref 1.7–7.7)
Neutrophils Relative %: 73 %
Platelets: 129 10*3/uL — ABNORMAL LOW (ref 150–400)
RBC: 2.64 MIL/uL — ABNORMAL LOW (ref 4.22–5.81)
RDW: 17.9 % — ABNORMAL HIGH (ref 11.5–15.5)
WBC: 9.7 10*3/uL (ref 4.0–10.5)
nRBC: 0.4 % — ABNORMAL HIGH (ref 0.0–0.2)

## 2021-12-18 LAB — BODY FLUID CELL COUNT WITH DIFFERENTIAL
Lymphs, Fluid: 3 %
Monocyte-Macrophage-Serous Fluid: 20 % — ABNORMAL LOW (ref 50–90)
Neutrophil Count, Fluid: 77 % — ABNORMAL HIGH (ref 0–25)
Total Nucleated Cell Count, Fluid: 228 cu mm (ref 0–1000)

## 2021-12-18 LAB — PROTEIN, PLEURAL OR PERITONEAL FLUID: Total protein, fluid: <3 g/dL

## 2021-12-18 LAB — MAGNESIUM: Magnesium: 2.2 mg/dL (ref 1.7–2.4)

## 2021-12-18 MED ORDER — CEFADROXIL 1 G PO TABS: 1.0000 g | ORAL_TABLET | Freq: Two times a day (BID) | ORAL | 0 refills | Status: DC

## 2021-12-18 MED ORDER — SENNOSIDES-DOCUSATE SODIUM 8.6-50 MG PO TABS
2.0000 | ORAL_TABLET | Freq: Two times a day (BID) | ORAL | Status: DC
  Administered 2021-12-18: 2 via ORAL
  Filled 2021-12-18: qty 2

## 2021-12-18 MED ORDER — LEVOTHYROXINE SODIUM 100 MCG PO TABS: 100.0000 ug | ORAL_TABLET | Freq: Every day | ORAL | 3 refills | Status: DC

## 2021-12-18 MED ORDER — POTASSIUM CHLORIDE CRYS ER 20 MEQ PO TBCR
40.0000 meq | EXTENDED_RELEASE_TABLET | Freq: Once | ORAL | Status: AC
  Administered 2021-12-18: 40 meq via ORAL
  Filled 2021-12-18: qty 2

## 2021-12-18 MED ORDER — LIDOCAINE HCL 1 % IJ SOLN
INTRAMUSCULAR | Status: AC
  Administered 2021-12-18: 15 mL
  Filled 2021-12-18: qty 20

## 2021-12-18 MED ORDER — POLYETHYLENE GLYCOL 3350 17 G PO PACK
17.0000 g | PACK | Freq: Every day | ORAL | Status: DC
  Administered 2021-12-18: 17 g via ORAL
  Filled 2021-12-18: qty 1

## 2021-12-18 NOTE — Progress Notes (Addendum)
Pts foley pulled prior to this RN's arrival on shift. Bladder scan volume was difficult to obtain d/t ascites, however the Bladder scan showed a volume >300. Unable to determine the bladder scans accuracy. Provider notified and I/O cath ordered. Only 50 ml of urine was able to be obtained. Provider notified, and instructed to re-scan pt in 6 hours. Scanned again - volume of 210. This pt was scanned a third time with a volume of 239. Provider notified of this as well. Pt reports no discomfort/pressure/urgency. Pt continues on IV abx. PRN compazine given for nausea with good results. Pt resting with no other complaints/concerns. Plan of care continues.

## 2021-12-18 NOTE — Discharge Summary (Signed)
Physician Discharge Summary   Patient: Randy Wood MRN: [id number] DOB: 13-May-1940  Admit date:     12/14/2021  Discharge date: 12/18/21  Discharge Physician: Edwin Dada   PCP: [name], [name], MD     Recommendations at discharge:  Follow up with Dr. Burr Medico, Oncology in 1 week Dr. Burr Medico: Please follow cytology from paracentesis on 9/21 Dr. 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 M with esophageal CA metastatic to liver and LNs on Taxol/ramucirumab, anemia, hypothyroidism, and HLD who presented with fatigue  In the ER, found to have pancytopenia, ANC 99, abdominal distension and urinary retention.       * Sepsis without end organ damage due to E coli bacteremia Patient with HR >90, RR>20 and leukopenia (in part due to infection, in part due to chemo) at the time of admission.  1/4 blood culture bottles subsequently positive for pansensitive E. Coli. We speculate urinary source.  Treated with Rocephin narrowed to cefazolin, defervesced well and discharged on cefadroxil 1 g BID for completing a 7 day course.    Symptomatic anemia Iron deficiency anemia Hemoglobin 7.3 g/dL on admission.  Received 1 unit PRBC on morning of 12/15/2021.   Subsequent Hgb improved.  Given IV iron.  Discharged with Heme-Onc follow up.     Severe neutropenia (HCC) Pancytopenia ANC 99 on admission, given Granix, WBC up to normal on  discharge.  Ascites He has some mild chronic liver disease with history ascites, although not chronically.  Here, he had a paracentesis of only 2L yellowish fluid, sent for cytology, neutrophil count ruled out SBP.  He had some residual abdominal distension, looked like gas, was passing stool and flatulence and appetite within normal limits, doubt ileus, obstruction.     Acute urinary retention Placed on admission due to PV residual >700.    This resolvd and he was voiding independently on day of discharge.        Hypothyroidism Last TSH 52.56 on 09/16/2021.  At that time Synthroid was increased from 50 mcg to 88 mcg daily.  Suspect this may be due to nivolumab.    Here TSH 49 again, also FT4 low at 0.41 and T3 low at 41.  My partner discused with Dr. Kelton Pillar  during hospitalization, increased Synthroid from 88 mcg to 100 mcg daily - Continue levothyroxine - Repeat thyroid studies in 4-6 weeks   Pleural effusion CT showed stable moderate right and small left pleural effusion and small pericardial effusion. These are chronic and unchanged  - Outpatient follow up  Liver disease, chronic Transaminitis due to mets and some degree of chronic liver dysfunction, seemed fairly well compensated.  Spironolactone resumed at discharge.     Adenocarcinoma of esophagus metastatic to liver [facility]) Known distal esophageal adenocarcinoma with hepatic and nodal metastases.             The [facility] Controlled Substances Registry  was reviewed for this patient prior to discharge.   Consultants: Oncology Procedures performed:  Paracentesis  Disposition: Home   DISCHARGE MEDICATION: Allergies as of 12/18/2021       Reactions   Bee Venom Swelling   Lisinopril Cough        Medication List     STOP taking these medications    lactulose 10 GM/15ML solution Commonly known as: CHRONULAC   ondansetron 8 MG tablet Commonly known as: ZOFRAN   prochlorperazine 10 MG  tablet Commonly known as: COMPAZINE       TAKE these medications    Azelastine HCl 137 MCG/SPRAY Soln INSTILL 2 SPRAYS INTO EACH NOSTRIL TWICE A DAY AS DIRECTED What changed: See the new instructions.   cefadroxil 1 g tablet Commonly known as: DURICEF Take 1 tablet (1 g total) by mouth 2 (two) times daily.   cetirizine 10 MG tablet Commonly known as: ZYRTEC Take 20 mg by mouth in the morning.   dorzolamide-timolol 22.3-6.8 MG/ML ophthalmic solution Commonly known as: COSOPT Place 1 drop into the left eye 2 (two) times daily.   EPINEPHrine 0.3 mg/0.3 mL Soaj injection Commonly known as: EPI-PEN Inject 0.3 mg into the muscle as needed for anaphylaxis.   ezetimibe 10 MG tablet Commonly known as: ZETIA Take 1 tablet (10 mg total) by mouth daily.   Iron Slow Release 142 (45 Fe) MG Tbcr Generic drug: Ferrous Sulfate Take 1 tablet by mouth daily.   latanoprost 0.005 % ophthalmic solution Commonly known as: XALATAN Place 1 drop into the left eye at bedtime.   levothyroxine 100 MCG tablet Commonly known as: SYNTHROID Take 1 tablet (100 mcg total) by mouth daily. Start taking on: December 19, 2021 What changed:  medication strength how much to take   lidocaine-prilocaine cream Commonly known as: EMLA Apply 1 application. topically as needed.   loratadine 10 MG tablet Commonly known as: CLARITIN Take 10 mg by mouth daily.   One-A-Day Mens 50+ Tabs Take 1 tablet by mouth daily.   PRESERVISION AREDS 2 PO Take 1 tablet by mouth 2 (two) times daily.   spironolactone 50 MG tablet Commonly known as: ALDACTONE TAKE ONE TABLET BY MOUTH DAILY        Follow-up Information     [name], MD Follow up.   Specialties: Hematology, Oncology Contact information: [place] [address] [phone]                 Discharge Instructions     Discharge instructions   Complete by: As directed    From Dr. Loleta Books: You were admitted  for weakness and found to have low blood counts and a bloodstream infection  The blood stream infection was from "E coli" and it was the common kind of E coli found throughout our colon.  You were treated with Rocephin and Cefazolin here, and you should complete treatment with their oral cousin, the antibiotic cefadroxil Take cefadroxil 1g twice daily for 4 more days starting tonight Take in the morning and at night  Go see Dr. Burr Medico next week  Resume your other home medicines   Increase activity slowly   Complete by: As directed        Discharge Exam: Filed Weights   12/14/21 2244 12/14/21 2244  Weight: 82.9 kg 82.9 kg    General: Pt is alert, awake, not in acute distress, sitting up in bed Cardiovascular: RRR, nl S1-S2, no murmurs appreciated.   No LE edema.  Respiratory: Normal respiratory rate and rhythm.  CTAB without rales or wheezes. Abdominal: Abdomen soft and non-tender. Slightly distended but no fluid, no pain.   Neuro/Psych: Strength symmetric in upper and lower extremities.  Judgment and insight appear normal.   Condition at discharge: stable  The results of significant diagnostics from this hospitalization (including imaging, microbiology, ancillary and laboratory) are listed below for reference.   Imaging Studies: US Paracentesis  Result Date: 12/18/2021 INDICATION: Patient with history of metastatic esophageal cancer, cirrhosis, e coli bacteremia, UTI, anemia, ascites. Request received diagnostic and therapeutic paracentesis. EXAM: ULTRASOUND GUIDED DIAGNOSTIC AND THERAPEUTIC PARACENTESIS MEDICATIONS: 8 ml 1 % lidocaine COMPLICATIONS: None immediate. PROCEDURE: Informed written consent was obtained from the patient after a discussion of the risks, benefits and alternatives to treatment. A timeout was performed prior to the initiation of the procedure. Initial ultrasound scanning demonstrates a small to moderate amount of ascites within the right lower abdominal  quadrant. The right lower abdomen was prepped and draped in the usual sterile fashion. 1% lidocaine was used for local anesthesia. Following this, a 19 gauge, 10-cm, Yueh catheter was introduced. An ultrasound image was saved for documentation purposes. The paracentesis was performed. The catheter was removed and a dressing was applied. The patient tolerated the procedure well without immediate post procedural complication. FINDINGS: A total of approximately 2.2 liters of hazy, light yellow fluid was removed. Samples were sent to the laboratory as requested by the clinical team. IMPRESSION: Successful ultrasound-guided paracentesis yielding 2.2 liters of peritoneal fluid. Read by: Rowe Robert, PA-C Electronically Signed   By: Jacqulynn Cadet M.D.   On: 12/18/2021 13:24   DG Abd Portable 1V  Result Date: 12/17/2021 CLINICAL DATA:  [address] with abdominal distention. EXAM: PORTABLE ABDOMEN - 1 VIEW COMPARISON:  Similar study yesterday at 11:04 a.m. FINDINGS: 5:05 a.m. There is generalized gas distention of the intestinal tract. There is no pathologic colonic dilatation but small bowel segments are dilated up to 3.6 cm with little if any change. Visceral shadows are stable. There is no supine evidence of free air. No pathologic calcification is seen. IMPRESSION: Generalized small-bowel gas distention with dilated segments up to 3.6 cm. Little if any change is noted. Electronically Signed   By: Telford Nab M.D.   On: 12/17/2021 07:32   DG Abd Portable 1V  Result Date: 12/16/2021 CLINICAL DATA:  Abdominal distension. EXAM: PORTABLE ABDOMEN - 1 VIEW COMPARISON:  December 14, 2021. FINDINGS: Dilated small bowel loops are again noted in left side of abdomen concerning for ileus or distal small bowel obstruction. No definite colonic dilatation is noted. IMPRESSION: Stable small bowel dilatation is noted concerning for ileus or distal small bowel obstruction. Electronically Signed   By: Marijo Conception M.D.   On:  12/16/2021 14:25   CT Angio Chest PE W and/or Wo Contrast  Result Date: 12/14/2021 CLINICAL DATA:  Increased weakness over the last several days. History of esophageal cancer. Concern for pulmonary embolism. EXAM: CT ANGIOGRAPHY CHEST WITH CONTRAST TECHNIQUE: Multidetector CT imaging of the chest was performed using the standard protocol during bolus administration of intravenous contrast. Multiplanar CT image reconstructions and MIPs were obtained to evaluate the vascular anatomy. RADIATION DOSE REDUCTION: This exam was performed according to the departmental dose-optimization program which includes automated exposure control, adjustment of the mA and/or kV according to patient size and/or use of iterative reconstruction technique. CONTRAST:  43m OMNIPAQUE IOHEXOL 350 MG/ML SOLN COMPARISON:  CT chest dated 10/21/2021. FINDINGS: Cardiovascular: Satisfactory opacification of the  pulmonary arteries to the segmental level. No evidence of pulmonary embolism. The right and left pulmonary arteries are enlarged, measuring 2.6 cm and 2.3 cm respectively, suggestive of pulmonary hypertension. Vascular calcifications are seen in the coronary arteries and aortic arch. Normal heart size. There is a small pericardial effusion. A right internal jugular central venous port catheter terminates in the superior vena cava. Mediastinum/Nodes: No enlarged mediastinal, hilar, or axillary lymph nodes. Thyroid gland, trachea, and esophagus demonstrate no significant findings. Lungs/Pleura: There is a moderate right and small left pleural effusion with associated atelectasis. These are similar to 10/21/2021. No pneumothorax. No suspicious pulmonary nodules. Upper Abdomen: Ascites is partially imaged. Musculoskeletal: Degenerative changes are seen in the spine. Review of the MIP images confirms the above findings. IMPRESSION: 1. No evidence of acute pulmonary embolism. 2. Moderate right and small left pleural effusions with associated  atelectasis appear similar to 10/21/2021. 3. Findings suggestive of pulmonary hypertension. Small pericardial effusion. 4. No evidence of progression of the patient's known esophageal cancer. 5. Ascites is partially imaged. Aortic Atherosclerosis (ICD10-I70.0). Electronically Signed   By: Zerita Boers M.D.   On: 12/14/2021 19:54   DG ABD ACUTE 2+V W 1V CHEST  Result Date: 12/14/2021 CLINICAL DATA:  Short of breath.  Weakness and fatigue. EXAM: DG ABDOMEN ACUTE WITH 1 VIEW CHEST COMPARISON:  CT 10/21/2021. FINDINGS: In the left lower quadrant of the abdomen there are dilated small bowel loops measure up to 5 cm. Colonic gas is present up to the level of the rectum. No signs of pneumoperitoneum. Heart size is normal. There is no pleural effusion or edema. No airspace opacities identified. IMPRESSION: Dilated small bowel loops in the left lower quadrant of the abdomen with gas noted in the colon up to the rectum. Cannot exclude partial small bowel obstruction. Electronically Signed   By: Kerby Moors M.D.   On: 12/14/2021 17:59    Microbiology: Results for orders placed or performed during the hospital encounter of 12/14/21  Blood culture (routine x 2)     Status: Abnormal   Collection Time: 12/14/21  6:33 PM   Specimen: BLOOD  Result Value Ref Range Status   Specimen Description   Final    BLOOD SITE NOT SPECIFIED Performed at [facility] [address] [place], [place] [address]    Special Requests   Final    BOTTLES DRAWN AEROBIC AND ANAEROBIC Blood Culture adequate volume Performed at [facility] [address], [place], [place] [address]    Culture  Setup Time   Final    GRAM NEGATIVE RODS ANAEROBIC BOTTLE ONLY CRITICAL RESULT CALLED TO, READ BACK BY AND VERIFIED WITH: PHARMD A PHAM [id number] AT 1059 AM BY CM Performed at [facility], [address] [place], [place] [address]    Culture ESCHERICHIA COLI (A)  Final   Report Status  12/17/2021 FINAL  Final   Organism ID, Bacteria ESCHERICHIA COLI  Final      Susceptibility   Escherichia coli - MIC*    AMPICILLIN 4 SENSITIVE Sensitive     CEFAZOLIN <=4 SENSITIVE Sensitive     CEFEPIME <=0.12 SENSITIVE Sensitive     CEFTAZIDIME <=1 SENSITIVE Sensitive     CEFTRIAXONE <=0.25 SENSITIVE Sensitive     CIPROFLOXACIN <=0.25 SENSITIVE Sensitive     GENTAMICIN <=1 SENSITIVE Sensitive     IMIPENEM <=0.25 SENSITIVE Sensitive     TRIMETH/SULFA <=20 SENSITIVE Sensitive     AMPICILLIN/SULBACTAM <=2 SENSITIVE Sensitive  PIP/TAZO <=4 SENSITIVE Sensitive     * ESCHERICHIA COLI  Blood Culture ID Panel (Reflexed)     Status: Abnormal   Collection Time: 12/14/21  6:33 PM  Result Value Ref Range Status   Enterococcus faecalis NOT DETECTED NOT DETECTED Final   Enterococcus Faecium NOT DETECTED NOT DETECTED Final   Listeria monocytogenes NOT DETECTED NOT DETECTED Final   Staphylococcus species NOT DETECTED NOT DETECTED Final   Staphylococcus aureus (BCID) NOT DETECTED NOT DETECTED Final   Staphylococcus epidermidis NOT DETECTED NOT DETECTED Final   Staphylococcus lugdunensis NOT DETECTED NOT DETECTED Final   Streptococcus species NOT DETECTED NOT DETECTED Final   Streptococcus agalactiae NOT DETECTED NOT DETECTED Final   Streptococcus pneumoniae NOT DETECTED NOT DETECTED Final   Streptococcus pyogenes NOT DETECTED NOT DETECTED Final   A.calcoaceticus-baumannii NOT DETECTED NOT DETECTED Final   Bacteroides fragilis NOT DETECTED NOT DETECTED Final   Enterobacterales DETECTED (A) NOT DETECTED Final    Comment: Enterobacterales represent a large order of gram negative bacteria, not a single organism. CRITICAL RESULT CALLED TO, READ BACK BY AND VERIFIED WITH: PHARMD A PHAM [id number] AT 2202 AM BY CM    Enterobacter cloacae complex NOT DETECTED NOT DETECTED Final   Escherichia coli DETECTED (A) NOT DETECTED Final    Comment: CRITICAL RESULT CALLED TO, READ BACK BY AND VERIFIED  WITH: PHARMD A PHAM [id number] AT 1059 BY CM    Klebsiella aerogenes NOT DETECTED NOT DETECTED Final   Klebsiella oxytoca NOT DETECTED NOT DETECTED Final   Klebsiella pneumoniae NOT DETECTED NOT DETECTED Final   Proteus species NOT DETECTED NOT DETECTED Final   Salmonella species NOT DETECTED NOT DETECTED Final   Serratia marcescens NOT DETECTED NOT DETECTED Final   Haemophilus influenzae NOT DETECTED NOT DETECTED Final   Neisseria meningitidis NOT DETECTED NOT DETECTED Final   Pseudomonas aeruginosa NOT DETECTED NOT DETECTED Final   Stenotrophomonas maltophilia NOT DETECTED NOT DETECTED Final   Candida albicans NOT DETECTED NOT DETECTED Final   Candida auris NOT DETECTED NOT DETECTED Final   Candida glabrata NOT DETECTED NOT DETECTED Final   Candida krusei NOT DETECTED NOT DETECTED Final   Candida parapsilosis NOT DETECTED NOT DETECTED Final   Candida tropicalis NOT DETECTED NOT DETECTED Final   Cryptococcus neoformans/gattii NOT DETECTED NOT DETECTED Final   CTX-M ESBL NOT DETECTED NOT DETECTED Final   Carbapenem resistance IMP NOT DETECTED NOT DETECTED Final   Carbapenem resistance KPC NOT DETECTED NOT DETECTED Final   Carbapenem resistance NDM NOT DETECTED NOT DETECTED Final   Carbapenem resist OXA 48 LIKE NOT DETECTED NOT DETECTED Final   Carbapenem resistance VIM NOT DETECTED NOT DETECTED Final    Comment: Performed at [facility], [address], [place], [place] [address]  Blood culture (routine x 2)     Status: None (Preliminary result)   Collection Time: 12/14/21  8:45 PM   Specimen: BLOOD LEFT FOREARM  Result Value Ref Range Status   Specimen Description   Final    BLOOD LEFT FOREARM Performed at [facility] [address], [place], [place] [address]    Special Requests   Final    BOTTLES DRAWN AEROBIC AND ANAEROBIC Blood Culture results may not be optimal due to an inadequate volume of blood received in culture bottles Performed at [facility] [address] [place], [place] [address]    Culture   Final    NO GROWTH 3 DAYS Performed at [facility], [address], [place], [place] [address]    Report Status PENDING  Incomplete  Gastrointestinal Panel by PCR , Stool     Status: None   Collection Time: 12/15/21  2:50 PM   Specimen: Stool  Result Value Ref Range Status   Campylobacter species NOT DETECTED NOT DETECTED Final   Plesimonas shigelloides NOT DETECTED NOT DETECTED Final   Salmonella species NOT DETECTED NOT DETECTED Final   Yersinia enterocolitica NOT DETECTED NOT DETECTED Final   Vibrio species NOT DETECTED NOT DETECTED Final   Vibrio cholerae NOT DETECTED NOT DETECTED Final   Enteroaggregative E coli (EAEC) NOT DETECTED NOT DETECTED Final   Enteropathogenic E coli (EPEC) NOT DETECTED NOT DETECTED Final   Enterotoxigenic E coli (ETEC) NOT DETECTED NOT DETECTED Final   Shiga like toxin producing E coli (STEC) NOT DETECTED NOT DETECTED Final   Shigella/Enteroinvasive E coli (EIEC) NOT DETECTED NOT DETECTED Final   Cryptosporidium NOT DETECTED NOT DETECTED Final   Cyclospora cayetanensis NOT DETECTED NOT DETECTED Final   Entamoeba histolytica NOT DETECTED NOT DETECTED Final   Giardia lamblia NOT DETECTED NOT DETECTED Final   Adenovirus F40/41 NOT DETECTED NOT DETECTED Final   Astrovirus NOT DETECTED NOT DETECTED Final   Norovirus GI/GII NOT DETECTED NOT DETECTED Final   Rotavirus A NOT DETECTED NOT DETECTED Final   Sapovirus (I, II, IV, and V) NOT DETECTED NOT DETECTED Final    Comment: Performed at [facility], [address] [place], [place] [address]  Culture, blood (Routine X 2) w Reflex to ID Panel     Status: None (Preliminary result)   Collection Time: 12/16/21  6:07 AM   Specimen: BLOOD  Result Value Ref Range Status   Specimen Description   Final    BLOOD RIGHT ANTECUBITAL Performed at [facility], [place] [address] [place], [place] [address]     Special Requests   Final    BOTTLES DRAWN AEROBIC ONLY Blood Culture adequate volume Performed at [facility] [address] [place], [place] [address]    Culture   Final    NO GROWTH 2 DAYS Performed at [facility] [address] [place], [place] [address]    Report Status PENDING  Incomplete  Culture, blood (Routine X 2) w Reflex to ID Panel     Status: None (Preliminary result)   Collection Time: 12/16/21  6:07 AM   Specimen: BLOOD  Result Value Ref Range Status   Specimen Description   Final    BLOOD RIGHT ANTECUBITAL Performed at [facility] [address], [place], [place] [address]    Special Requests   Final    BOTTLES DRAWN AEROBIC AND ANAEROBIC Blood Culture adequate volume Performed at [facility] [address], [place], [place] [address]    Culture   Final    NO GROWTH 2 DAYS Performed at [facility] [address] [place], [place] [address]    Report Status PENDING  Incomplete    Labs: CBC: Recent Labs  Lab 12/14/21 1810 12/15/21 0513 12/16/21 0607 12/17/21 0539 12/18/21 0546  WBC 0.9* 1.1* 2.6* 6.7 9.7  NEUTROABS 0.1* 0.1* 0.8* 4.1 7.0  HGB 7.3* 7.9* 8.2* 7.7* 8.0*  HCT 22.9* 24.1* 25.9* 24.2* 25.1*  MCV 97.0 94.9 95.6 94.9 95.1  PLT 123* 100* 102* 104* 076*   Basic Metabolic Panel: Recent Labs  Lab 12/14/21 1810 12/15/21 0513 12/16/21 2263 12/17/21 0539 12/18/21 0546  NA 134* 134* 135 135 135  K 4.5 4.1 3.7 3.5 3.2*  CL 111 113* 113* 111 112*  CO2 17* 16* 16* 18* 18*  GLUCOSE 93 88 114* 107* 130*  BUN '16 15 17 14 13  '$ CREATININE 1.21 1.12 1.31* 1.01 1.13  CALCIUM 8.1* 7.6* 7.8* 7.6* 7.8*  MG  --   --  2.0 1.7 2.2   Liver Function Tests: Recent Labs  Lab 12/14/21 1810 12/15/21 0513  AST 30 26  ALT 18 15  ALKPHOS 71 59  BILITOT 1.3* 1.4*  PROT 5.6* 4.6*  ALBUMIN 3.0* 2.5*   CBG: No results for input(s): "GLUCAP" in the last 168  hours.  Discharge time spent: approximately 35 minutes spent on discharge counseling, evaluation of patient on day of discharge, and coordination of discharge planning with nursing, social work, pharmacy and case management  Signed: Edwin Dada, MD [facility] 12/18/2021

## 2021-12-18 NOTE — TOC Transition Note (Signed)
Transition of Care [facility]) - CM/SW Discharge Note   Patient Details  Name: Randy Wood MRN: [id number] Date of Birth: 08/09/1940  [facility]) CM/SW Contact:  Leeroy Cha, RN Phone Number: 12/18/2021, 12:13 PM   Clinical Narrative:    Patient dcd to return home with self care. No toc needs present.   Final next level of care: Home/Self Care Barriers to Discharge: Barriers Resolved   Patient Goals and CMS Choice Patient states their goals for this hospitalization and ongoing recovery are:: to return home CMS Medicare.gov Compare Post Acute Care list provided to:: Patient Choice offered to / list presented to : Patient  Discharge Placement                       Discharge Plan and Services   Discharge Planning Services: CM Consult                                 Social Determinants of Health (SDOH) Interventions Housing Interventions: Intervention Not Indicated   Readmission Risk Interventions   No data to display

## 2021-12-18 NOTE — Procedures (Signed)
Ultrasound-guided diagnostic and therapeutic paracentesis performed yielding 2.2 liters of hazy, light yellow fluid. No immediate complications. A portion of the fluid was sent to the lab for preordered studies. EBL none.

## 2021-12-18 NOTE — Progress Notes (Signed)
Pt discharged home with daughter in stable condition. Discharge instructions given. Scripts sent to pharmacy of choice. No immediate questions or concerns at this time. Pt discharged from unit via wheelchair.

## 2021-12-18 NOTE — Plan of Care (Signed)
  Problem: Clinical Measurements: Goal: Ability to maintain clinical measurements within normal limits will improve Outcome: Adequate for Discharge Goal: Will remain free from infection Outcome: Adequate for Discharge   Problem: Health Behavior/Discharge Planning: Goal: Ability to manage health-related needs will improve Outcome: Completed/Met   Problem: Clinical Measurements: Goal: Diagnostic test results will improve Outcome: Completed/Met   Problem: Activity: Goal: Risk for activity intolerance will decrease Outcome: Completed/Met   Problem: Nutrition: Goal: Adequate nutrition will be maintained Outcome: Completed/Met   Problem: Coping: Goal: Level of anxiety will decrease Outcome: Completed/Met   Problem: Elimination: Goal: Will not experience complications related to bowel motility Outcome: Completed/Met Goal: Will not experience complications related to urinary retention Outcome: Completed/Met

## 2021-12-18 NOTE — Care Management Important Message (Signed)
Important Message  Patient Details IM Letter placed in Patients room. Name: Randy Wood MRN: [id number] Date of Birth: December 11, 1940   Medicare Important Message Given:  Yes     Kerin Salen 12/18/2021, 9:28 AM

## 2021-12-19 LAB — CYTOLOGY - NON PAP

## 2021-12-20 LAB — GRAM STAIN

## 2021-12-20 LAB — CULTURE, BLOOD (ROUTINE X 2): Culture: NO GROWTH

## 2021-12-21 LAB — CULTURE, BLOOD (ROUTINE X 2)
Culture: NO GROWTH
Culture: NO GROWTH
Special Requests: ADEQUATE
Special Requests: ADEQUATE

## 2021-12-22 ENCOUNTER — Telehealth: Payer: Self-pay | Admitting: *Deleted

## 2021-12-22 ENCOUNTER — Encounter (INDEPENDENT_AMBULATORY_CARE_PROVIDER_SITE_OTHER): Payer: Medicare Other | Admitting: Ophthalmology

## 2021-12-22 NOTE — Patient Outreach (Signed)
  Care Coordination [facility] Note Transition Care Management Unsuccessful Follow-up Telephone Call  Date of discharge and from where:  12/18/21 [facility]  Attempts:  1st Attempt  Reason for unsuccessful TCM follow-up call:  Left voice message  Chong Sicilian, BSN, RN-BC RN Care Coordinator [place]: [phone] Main #: [phone]

## 2021-12-23 ENCOUNTER — Telehealth: Payer: Self-pay | Admitting: *Deleted

## 2021-12-23 ENCOUNTER — Encounter: Payer: Self-pay | Admitting: *Deleted

## 2021-12-23 ENCOUNTER — Other Ambulatory Visit: Payer: Self-pay

## 2021-12-23 DIAGNOSIS — C155 Malignant neoplasm of lower third of esophagus: Secondary | ICD-10-CM

## 2021-12-23 LAB — CULTURE, BODY FLUID W GRAM STAIN -BOTTLE: Culture: NO GROWTH

## 2021-12-23 NOTE — Patient Outreach (Signed)
  Care Coordination [facility] Note Transition Care Management Follow-up Telephone Call Date of discharge and from where: 12/18/21 from [place] How have you been since you were released from the hospital? Per daughter, "He's getting stronger everyday but still very sleepy" Any questions or concerns? Yes. Daughters and patient would like to take a break from chemo and see how he feels off of it and how the repeat CT scan looks at the end of October. Therapeutic listening utilized. Offered recommendations on questions to ask oncologist. Inquired if he has a patient advocate through the cancer center. He has talked with her before but didn't find her helpful. Discussed palliative care. Daughter said that he was referred but that no one has reached out recently. Chart review shows referral to [facility] in 11/2020 but it was not completed and marked as expired in 05/2021. He's not ready to have end-of-life discussions at this point. Family is very supportive.  Items Reviewed: Did the pt receive and understand the discharge instructions provided? Yes  Medications obtained and verified? Yes  Other? Yes . Reviewed and discussed recent CT on 9/17. Wound care for small open area on his foot. Currently applying antibiotic ointment and a bandaid. Advised to monitor for increasing size or s/s of infection and to notify PCP if changes are noted. Any new allergies since your discharge? No  Dietary orders reviewed? Yes Do you have support at home? Yes   Home Care and Equipment/Supplies: Were home health services ordered? no If so, what is the name of the agency? N/a  Has the agency set up a time to come to the patient's home? not applicable Were any new equipment or medical supplies ordered?  No What is the name of the medical supply agency? N/a Were you able to get the supplies/equipment? not applicable Do you have any questions related to the use of the equipment or supplies? No  Functional Questionnaire: (I =  Independent and D = Dependent) ADLs: I  Bathing/Dressing- I  Meal Prep- I  Eating- I  Maintaining continence- I  Transferring/Ambulation- I  Managing Meds- I  Follow up appointments reviewed:  PCP Hospital f/u appt confirmed? Yes  Scheduled to see Dr [name] on 01/07/22 @ 10:10 for CPE. Will inquire if he needs to be seen sooner for hosp f/u. [facility] f/u appt confirmed? Yes  Scheduled to see Dr Burr Medico (oncology) on 01/01/22 @ 10:40. Are transportation arrangements needed? No  If their condition worsens, is the pt aware to call PCP or go to the Emergency Dept.? Yes Was the patient provided with contact information for the PCP's office or ED? Yes Was to pt encouraged to call back with questions or concerns? Yes  SDOH assessments and interventions completed:   Yes  Care Coordination Interventions Activated:  Yes   Care Coordination Interventions:  PCP follow up appointment requested. Staff message sent to [place] to coordinate appointment. Staff message sent to Ihor Gully, RN Navigator regarding expired palliative care referral from 11/2020 and family's desire for an assessment.     Encounter Outcome:  Pt. Visit Completed    Chong Sicilian, BSN, RN-BC RN Care Coordinator [place] Direct Dial: [phone] Main #: [phone]

## 2021-12-26 ENCOUNTER — Telehealth: Payer: Self-pay

## 2021-12-26 ENCOUNTER — Encounter (HOSPITAL_BASED_OUTPATIENT_CLINIC_OR_DEPARTMENT_OTHER): Payer: Self-pay

## 2021-12-26 ENCOUNTER — Ambulatory Visit (HOSPITAL_BASED_OUTPATIENT_CLINIC_OR_DEPARTMENT_OTHER): Payer: Medicare Other | Admitting: Family Medicine

## 2021-12-26 NOTE — Telephone Encounter (Signed)
Erroneous entry

## 2021-12-26 NOTE — Telephone Encounter (Signed)
Patient's daughter (lives in [place]) called to ask questions and to share information regarding pt's status. She is in town for a few days and is concerned for her father. She is hesitant for her father to know that she spoke with this Probation officer.  Patient is gaining weight and ascites is increasing. Some increase in SOB. Patient adamant that it is gas and not fluid collecting. He does not want to be evaluated at this time.  Explained cause of ascites and what to watch for. Explained that [facility] has a fluid clinic and patient can be scheduled for another paracentesis.  Explained that he would need to go to ED over the weekend if symptoms increase and he needs to be seen.   Daughter had questions regarding life-expectancy. Patient to see Dr. Burr Medico on 01/01/22 and with PCP on 12/30/21.    Encouraged family to call back on Monday to let clinic know how he is doing.

## 2021-12-29 ENCOUNTER — Ambulatory Visit (HOSPITAL_BASED_OUTPATIENT_CLINIC_OR_DEPARTMENT_OTHER): Payer: Medicare Other | Admitting: Family Medicine

## 2021-12-29 ENCOUNTER — Encounter (INDEPENDENT_AMBULATORY_CARE_PROVIDER_SITE_OTHER): Payer: Medicare Other | Admitting: Ophthalmology

## 2021-12-29 DIAGNOSIS — H43813 Vitreous degeneration, bilateral: Secondary | ICD-10-CM

## 2021-12-29 DIAGNOSIS — H353122 Nonexudative age-related macular degeneration, left eye, intermediate dry stage: Secondary | ICD-10-CM | POA: Diagnosis not present

## 2021-12-29 DIAGNOSIS — I10 Essential (primary) hypertension: Secondary | ICD-10-CM

## 2021-12-29 DIAGNOSIS — H353211 Exudative age-related macular degeneration, right eye, with active choroidal neovascularization: Secondary | ICD-10-CM

## 2021-12-29 DIAGNOSIS — H35033 Hypertensive retinopathy, bilateral: Secondary | ICD-10-CM

## 2021-12-30 ENCOUNTER — Inpatient Hospital Stay: Payer: Medicare Other

## 2021-12-30 ENCOUNTER — Encounter (HOSPITAL_BASED_OUTPATIENT_CLINIC_OR_DEPARTMENT_OTHER): Payer: Self-pay | Admitting: Family Medicine

## 2021-12-30 ENCOUNTER — Ambulatory Visit (INDEPENDENT_AMBULATORY_CARE_PROVIDER_SITE_OTHER): Payer: Medicare Other | Admitting: Family Medicine

## 2021-12-30 VITALS — BP 119/58 | HR 95 | Ht 71.0 in | Wt 184.6 lb

## 2021-12-30 DIAGNOSIS — D509 Iron deficiency anemia, unspecified: Secondary | ICD-10-CM | POA: Diagnosis not present

## 2021-12-30 DIAGNOSIS — R140 Abdominal distension (gaseous): Secondary | ICD-10-CM | POA: Diagnosis not present

## 2021-12-30 DIAGNOSIS — E032 Hypothyroidism due to medicaments and other exogenous substances: Secondary | ICD-10-CM | POA: Diagnosis not present

## 2021-12-30 LAB — CBC WITH DIFFERENTIAL/PLATELET
Basophils Absolute: 0.1 10*3/uL (ref 0.0–0.2)
Basos: 2 %
EOS (ABSOLUTE): 0.1 10*3/uL (ref 0.0–0.4)
Eos: 1 %
Hematocrit: 25.4 % — ABNORMAL LOW (ref 37.5–51.0)
Hemoglobin: 8.4 g/dL — ABNORMAL LOW (ref 13.0–17.7)
Immature Grans (Abs): 0 10*3/uL (ref 0.0–0.1)
Immature Granulocytes: 0 %
Lymphocytes Absolute: 1.9 10*3/uL (ref 0.7–3.1)
Lymphs: 27 %
MCH: 31.6 pg (ref 26.6–33.0)
MCHC: 33.1 g/dL (ref 31.5–35.7)
MCV: 96 fL (ref 79–97)
Monocytes Absolute: 0.6 10*3/uL (ref 0.1–0.9)
Monocytes: 9 %
Neutrophils Absolute: 4.4 10*3/uL (ref 1.4–7.0)
Neutrophils: 61 %
Platelets: 219 10*3/uL (ref 150–450)
RBC: 2.66 x10E6/uL — CL (ref 4.14–5.80)
RDW: 19 % — ABNORMAL HIGH (ref 11.6–15.4)
WBC: 7.2 10*3/uL (ref 3.4–10.8)

## 2021-12-30 NOTE — Progress Notes (Signed)
Procedures performed today:    None.  Independent interpretation of notes and tests performed by another provider:   None.  Brief History, Exam, Impression, and Recommendations:    BP (!) 119/58   Pulse 95   Ht 5' 11" (1.803 m)   Wt 184 lb 9.6 oz (83.7 kg)   SpO2 97%   BMI 25.75 kg/m   Patient is accompanied today by daughter  Patient did have recent hospitalization from which he was discharged about 2 weeks ago.  At time of presentation to hospital, he was found to be in sepsis due to E. coli bacteremia.  He also had severe anemia related to iron deficiency.  He did receive blood transfusion while he was in the hospital.  Additionally, he was noted to have abdominal distention related to ascites.  Due to this, he did undergo paracentesis during hospitalization, did not have any evidence of SBP. Patient has been doing okay since time of discharge.  He continues to have some fatigue and difficulty with performing some ADLs related to his fatigue and deconditioning as a result of hospitalization.  Patient and daughter do have questions related to management moving forward, in particular related to his underlying diagnosis of esophageal adenocarcinoma.  He continues to follow with oncology, has been undergoing palliative treatment.  He relates that these treatments do cause notable effects related to his fatigue and other symptoms.  He specifically does have some potential questions related to management moving forward and ultimately goals of treatment/care. We had a long discussion today reviewing considerations related to palliative treatment end-of-life considerations, decision-making related to quality of life/continued palliative treatments.  He does have some concerns as to whether he feels that he is able to have these conversations with his oncologist.  He thinks that he has been able to speak more openly with a nurse practitioner that he has met with at his oncology office. I  recommend that he does discuss this with his oncology team.  Discussed that ultimately we want to honor his wishes related to treatment goals and how we can best optimize his quality of life.  He will also further discuss with family members and we will continue to provide assistance as able.  Patient does have upcoming appointment for and we can further review any questions or concerns at that time. We will check labs today to monitor observed anemia during recent hospitalization  Abdominal distension Noted during recent hospitalization as above.  Recommend continued close monitoring.  Patient doing well today  Iron deficiency anemia Noted during recent hospitalization, did receive blood transfusion.  We will recheck CBC today for monitoring  Hypothyroidism Was found to have notably elevated TSH with low free T4 and T3.  Dose of levothyroxine was adjusted in the hospital.  He was advised to follow-up with his endocrinologist for monitoring with repeat labs about 4 to 6 weeks after hospitalization.  Spent 34 minutes on this patient encounter, including preparation, chart review, face-to-face counseling with patient and coordination of care, and documentation of encounter  Return for scheduled Medicare wellness visit next week.   ___________________________________________ Randy Wiederhold [name], MD, ABFM, CAQSM Primary Care and [place]

## 2021-12-30 NOTE — Patient Instructions (Signed)
  Medication Instructions:  Your physician recommends that you continue on your current medications as directed. Please refer to the Current Medication list given to you today. --If you need a refill on any your medications before your next appointment, please call your pharmacy first. If no refills are authorized on file call the office.-- Lab Work: Your physician has recommended that you have lab work today: CBC  If you have labs (blood work) drawn today and your tests are completely normal, you will receive your results via [facility] a phone call from our staff.  Please ensure you check your voicemail in the event that you authorized detailed messages to be left on a delegated number. If you have any lab test that is abnormal or we need to change your treatment, we will call you to review the results.  Referrals/Procedures/Imaging: No  Follow-Up: Your next appointment:   Your physician recommends that you schedule a follow-up appointment in: Medicare Wellness already scheduled need 40 min slot with Dr. [name].   You will receive a text message or e-mail with a link to a survey about your care and experience with [place] today! We would greatly appreciate your feedback!   Thanks for letting us be apart of your health journey!!  Primary Care and Sports Medicine   Dr. Arlina Robes [place]   We encourage you to activate your patient portal called "MyChart".  Sign up information is provided on this After Visit Summary.  MyChart is used to connect with patients for Virtual Visits (Telemedicine).  Patients are able to view lab/test results, encounter notes, upcoming appointments, etc.  Non-urgent messages can be sent to your provider as well. To learn more about what you can do with MyChart, please visit --  [email or web address].

## 2021-12-31 MED FILL — Dexamethasone Sodium Phosphate Inj 100 MG/10ML: INTRAMUSCULAR | Qty: 1 | Status: AC

## 2022-01-01 ENCOUNTER — Inpatient Hospital Stay: Payer: Medicare Other | Attending: Nurse Practitioner | Admitting: Hematology

## 2022-01-01 ENCOUNTER — Inpatient Hospital Stay: Payer: Medicare Other

## 2022-01-01 ENCOUNTER — Other Ambulatory Visit: Payer: Self-pay

## 2022-01-01 DIAGNOSIS — C155 Malignant neoplasm of lower third of esophagus: Secondary | ICD-10-CM | POA: Diagnosis not present

## 2022-01-01 DIAGNOSIS — E876 Hypokalemia: Secondary | ICD-10-CM | POA: Insufficient documentation

## 2022-01-01 DIAGNOSIS — Z95828 Presence of other vascular implants and grafts: Secondary | ICD-10-CM

## 2022-01-01 DIAGNOSIS — D649 Anemia, unspecified: Secondary | ICD-10-CM | POA: Diagnosis not present

## 2022-01-01 DIAGNOSIS — Z79899 Other long term (current) drug therapy: Secondary | ICD-10-CM | POA: Diagnosis not present

## 2022-01-01 DIAGNOSIS — C159 Malignant neoplasm of esophagus, unspecified: Secondary | ICD-10-CM

## 2022-01-01 DIAGNOSIS — Z66 Do not resuscitate: Secondary | ICD-10-CM | POA: Diagnosis not present

## 2022-01-01 DIAGNOSIS — C787 Secondary malignant neoplasm of liver and intrahepatic bile duct: Secondary | ICD-10-CM | POA: Diagnosis not present

## 2022-01-01 LAB — COMPREHENSIVE METABOLIC PANEL
ALT: 18 U/L (ref 0–44)
AST: 41 U/L (ref 15–41)
Albumin: 3 g/dL — ABNORMAL LOW (ref 3.5–5.0)
Alkaline Phosphatase: 83 U/L (ref 38–126)
Anion gap: 7 (ref 5–15)
BUN: 16 mg/dL (ref 8–23)
CO2: 19 mmol/L — ABNORMAL LOW (ref 22–32)
Calcium: 8.1 mg/dL — ABNORMAL LOW (ref 8.9–10.3)
Chloride: 111 mmol/L (ref 98–111)
Creatinine, Ser: 0.97 mg/dL (ref 0.61–1.24)
GFR, Estimated: >60 mL/min (ref >60)
Glucose, Bld: 105 mg/dL — ABNORMAL HIGH (ref 70–99)
Potassium: 4.1 mmol/L (ref 3.5–5.1)
Sodium: 137 mmol/L (ref 135–145)
Total Bilirubin: 0.9 mg/dL (ref 0.3–1.2)
Total Protein: 5.4 g/dL — ABNORMAL LOW (ref 6.5–8.1)

## 2022-01-01 LAB — IRON AND IRON BINDING CAPACITY (CC-WL,HP ONLY)
Iron: 43 ug/dL — ABNORMAL LOW (ref 45–182)
Saturation Ratios: 16 % — ABNORMAL LOW (ref 17.9–39.5)
TIBC: 267 ug/dL (ref 250–450)
UIBC: 224 ug/dL (ref 117–376)

## 2022-01-01 LAB — CBC WITH DIFFERENTIAL/PLATELET
Abs Immature Granulocytes: 0.01 10*3/uL (ref 0.00–0.07)
Basophils Absolute: 0.1 10*3/uL (ref 0.0–0.1)
Basophils Relative: 3 %
Eosinophils Absolute: 0.1 10*3/uL (ref 0.0–0.5)
Eosinophils Relative: 3 %
HCT: 25.1 % — ABNORMAL LOW (ref 39.0–52.0)
Hemoglobin: 7.9 g/dL — ABNORMAL LOW (ref 13.0–17.0)
Immature Granulocytes: 0 %
Lymphocytes Relative: 24 %
Lymphs Abs: 1.1 10*3/uL (ref 0.7–4.0)
MCH: 31.9 pg (ref 26.0–34.0)
MCHC: 31.5 g/dL (ref 30.0–36.0)
MCV: 101.2 fL — ABNORMAL HIGH (ref 80.0–100.0)
Monocytes Absolute: 0.6 10*3/uL (ref 0.1–1.0)
Monocytes Relative: 12 %
Neutro Abs: 2.7 10*3/uL (ref 1.7–7.7)
Neutrophils Relative %: 58 %
Platelets: 197 10*3/uL (ref 150–400)
RBC: 2.48 MIL/uL — ABNORMAL LOW (ref 4.22–5.81)
RDW: 22 % — ABNORMAL HIGH (ref 11.5–15.5)
WBC: 4.7 10*3/uL (ref 4.0–10.5)
nRBC: 0 % (ref 0.0–0.2)

## 2022-01-01 LAB — TOTAL PROTEIN, URINE DIPSTICK: Protein, ur: <30 mg/dL — AB

## 2022-01-01 LAB — FERRITIN: Ferritin: 53 ng/mL (ref 24–336)

## 2022-01-01 MED ORDER — SODIUM CHLORIDE 0.9% FLUSH
10.0000 mL | Freq: Once | INTRAVENOUS | Status: AC
  Administered 2022-01-01: 10 mL

## 2022-01-01 NOTE — Progress Notes (Signed)
Palliative Care referral sent again to [facility] to re-establish pt's care.  Epic fax confirmation received.  Informed pt that Dr. Burr Medico ordered for him to have a paracentesis.  Pt's US paracentesis is scheduled on 01/02/2022 at 1315.  Pt verbalized and confirmed appt.    Verbal order for IR Paracentesis w/[facility] given by Dr. Burr Medico.  Order placed and MyChart message sent to pt on how to schedule his paracentesis with [facility] when needed.

## 2022-01-01 NOTE — Progress Notes (Addendum)
[place]   [phone] Fax:([phone]) [phone]   Clinic Follow up Note   Patient Care Team: [name], [name], MD as PCP - General (Family Medicine) [name], MD as Consulting Physician (Gastroenterology) [name], MD as Consulting Physician (Hematology) [name], NP as Nurse Practitioner (Nurse Practitioner) Ander Slade, Carlisle Beers, MD (Ophthalmology) [facility] as Consulting Physician [facility] and Palliative Medicine)  Date of Service:  01/01/2022  CHIEF COMPLAINT: f/u of esophageal cancer  CURRENT THERAPY:  Chem on hold  ASSESSMENT & PLAN:  Randy Wood is a 81 y.o. male with   1. 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 Molecular testing showed MMR normal, PD-L1 CPS 3%. -PET 12/03/20 showed: esophageal tumor with local perigastric infiltration; hepatic and nodal metastasis involving the chest and abdomen extending to lower retroperitoneum; burst fracture at L5 with increased metabolic activity -s/p palliative radiation to primary tumor 9/15 - 12/27/20 -he took Xeloda and nivolumab q14d 01/07/21 - 05/2021. He was switched to taxol 07/17/21 due to worsening nodal disease on CT. -restaging CT CAP on 10/21/21 showed treatment response. -he was admitted 9/17 - 12/18/21 for E coli infection, UTI with retention, severe neutropenia, and new right pleural effusion and ascites. He had paracentesis on 9/21, cytology was negative. -he is slowly recovering from hospitalization. Physical exam shows increase in ascites since release from the hospital. We will plan for repeat paracentesis. I recommend holding chemo for now; they agree. -labs reviewed,  hgb down to 7.9. CMP shows low calcium, protein, and albumin. Urine protein <30. -Given his recent hospital admission and deconditioning, new onset ascites, he may not be a candidate for further chemotherapy.  We discussed palliative care and hospice today, patient agreed to have home hospice care.  He is not ready for hospice yet, will reassess him in 3 weeks.   2. Anemia -Patient's iron studies show low ferritin and low iron and saturation on 09/04/21. On oral iron. -hgb dropped to 7.9, iron 43 today (01/01/22), Iron level slightly low, will give one dose ferric gluconate    3. Recurrent ascites -he previously required paracentesis in 04/2020 for abdominal distention -he developed recurrent ascites in 11/2021, requiring paracentesis during hospitalization on 12/18/21. Cytology was negative. -he again has fluid build up on exam today. I will order repeat paracentesis to be done soon.    4.  Goal of care discussion  -We again discussed the incurable nature of his cancer, and the overall poor prognosis, especially if he does not have good response to chemotherapy or progress on chemo -The patient understands the goal of care is palliative. -I recommend DNR/DNI, he previously agreed on his last hospital admission, and confirmed today   PLAN: -hold chemo for now  -paracentesis to be done next week  -lab, flush, and f/u in 3 weeks  -Referral to [facility] palliative home care -I signed his DNR paper and gave it to pt   No problem-specific Assessment & Plan notes found for this encounter.   SUMMARY OF ONCOLOGIC HISTORY: Oncology History Overview Note  Cancer Staging No matching staging information was found for the patient.    Adenocarcinoma of esophagus metastatic to liver ([place])  11/11/2020 Procedure   EGD by Dr. Watt Climes - Normal larynx. -? Malignant-appearing  esophageal stenosis. Biopsied. - Pills were found in the esophagus. Removal was successful. - Rule out malignancy,? gastric tumor  in the cardia difficult to say based on bleeding from passing the scope and unable to wash and suction for adequate visualization. - Normal ampulla, duodenal bulb, first portion of the duodenum, second portion of the duodenum, major papilla and area of the papilla. - The examination was otherwise normal.   11/11/2020 Initial Biopsy   FINAL MICROSCOPIC DIAGNOSIS:  A. ESOPHAGUS, DISTAL, BIOPSY:  -  Poorly differentiated adenocarcinoma  -  See comment    11/11/2020 Initial Diagnosis   Primary adenocarcinoma of distal third of esophagus ([place])   11/11/2020 Cancer Staging   Staging form: Esophagus - Adenocarcinoma, AJCC 8th Edition - Clinical stage from 11/11/2020: Stage IVB (cTX, cN1, cM1) - Signed by [name], MD on 12/26/2020   11/14/2020 Imaging   CT CAPIMPRESSION: 1. Aggressive appearing new tumor of the gastroesophageal junction confluent with the irregular collection of right gastric lymph nodes, and with new associated metastatic retroperitoneal adenopathy. Small paraesophageal lymph nodes in the thorax are increased in size from prior exam and could also be involved, and there is a nonspecific 1.0 cm lesion inferiorly in the right hepatic lobe which could potentially be metastatic. 2. New subacute burst (AO type A4) fracture of L5, with mild posterior bony retropulsion and with a dominant coronally oriented fracture plane, and about 50% loss of height. Correlate with interval trauma. 3. Slight thickening along the left paracolic gutter and trace pelvic ascites, although the ascites is markedly reduced compared to 05/04/2020. 4. Other imaging findings of potential clinical significance: Aortic Atherosclerosis (ICD10-I70.0). Coronary atherosclerosis. Old compression fracture at L2.   12/03/2020 PET scan   IMPRESSION: Tumor at the GE junction with local perigastric infiltration, hepatic and nodal metastases.   Nodal involvement both in the chest and abdomen extending to  the lower retroperitoneum in the abdomen.   Burst fracture at L5 shows signs of increased metabolic activity, nonspecific in the setting of fracture, in light of other findings would correlate with any history of trauma, if no history of trauma consider MRI to exclude the possibility of pathologic fracture.   12/27/2020 Genetic Testing   Negative hereditary cancer genetic testing: no pathogenic variants detected in [facility] Multi-Cancer +RNA Panel.  The report date is December 27, 2020.   The Multi-Cancer + RNA Panel offered by [facility] includes sequencing and/or deletion/duplication analysis of the following 84 genes:  AIP*, ALK, APC*, ATM*, AXIN2*, BAP1*, BARD1*, BLM*, BMPR1A*, BRCA1*, BRCA2*, BRIP1*, CASR, CDC73*, CDH1*, CDK4, CDKN1B*, CDKN1C*, CDKN2A, CEBPA, CHEK2*, CTNNA1*, DICER1*, DIS3L2*, EGFR, EPCAM, FH*, FLCN*, GATA2*, GPC3, GREM1, HOXB13, HRAS, KIT, MAX*, MEN1*, MET, MITF, MLH1*, MSH2*, MSH3*, MSH6*, MUTYH*, NBN*, NF1*, NF2*, NTHL1*, PALB2*, PDGFRA, PHOX2B, PMS2*, POLD1*, POLE*, POT1*, PRKAR1A*, PTCH1*, PTEN*, RAD50*, RAD51C*, RAD51D*, RB1*, RECQL4, RET, RUNX1*, SDHA*, SDHAF2*, SDHB*, SDHC*, SDHD*, SMAD4*, SMARCA4*, SMARCB1*, SMARCE1*, STK11*, SUFU*, TERC, TERT, TMEM127*, Tp53*, TSC1*, TSC2*, VHL*, WRN*, and WT1.  RNA analysis is performed for * genes.   01/07/2021 - 05/07/2021 Chemotherapy   Patient is on Treatment Plan : GASTROESOPHAGEAL Nivolumab q14d x 8 cycles / Nivolumab q28d     03/26/2021 Imaging   CT CAP IMPRESSION: 1. Evidence of treatment response. There is decreased size of the irregular infiltrative mass centered at the gastroesophageal junction as well as decreased size of numerous metastatic retroperitoneal and upper abdominal lymph nodes. 2. 2.1 cm hepatic metastasis at the inferior right hepatic lobe segment 6 is stable to slightly  increased in size since previous PET-CT. No new hepatic mass visualized. 3. A few new adjacent small pulmonary nodules in the posterior left  lower lobe measuring up to 4 mm in size. Continued follow-up recommended. 4. Small right pleural effusion which is new since previous study. Small pericardial effusion increased since previous study. 5. Other ancillary findings as described.     06/30/2021 Imaging   EXAM: CT CHEST, ABDOMEN, AND PELVIS WITH CONTRAST  IMPRESSION: 1. Worsening nodal disease in the chest and abdomen more so within the abdomen as described. 2. Increasing size of RIGHT pleural fluid and pericardial effusion. Now moderate. 3. Increasing size of RIGHT hepatic lesion. 4. New LEFT adrenal metastasis. 5. Increasing size of retroperitoneal adenopathy. 6. Obstruction of the RIGHT ureter may be related to a metastatic lesion to the RIGHT ureter, blood clot or primary urothelial lesion and is associated with mild hydronephrosis, moderate ureteral dilation and potential filling defect also in lower pole calyces of the RIGHT kidney. 7. Trace perihepatic ascites. 8. Aortic atherosclerosis.   07/17/2021 - 10/23/2021 Chemotherapy   Patient is on Treatment Plan : GASTROESOPHAGEAL Ramucirumab D1, 15  / PACLitaxel D1,8,15 q28d     10/09/2021 -  Chemotherapy   Patient is on Treatment Plan : GASTROESOPHAGEAL Ramucirumab D1, 15 + Paclitaxel D1,8,15 q28d        INTERVAL HISTORY:  Randy Wood is here for a follow up of esophageal cancer. He was last seen by me on 12/02/21. He presents to the clinic accompanied by his daughter. He reports he remains cold. He denies hematochezia, notes stool is gray in color. He is ambulating with a walker, as he remains unsteady. I encouraged him to use a walker.   All other systems were reviewed with the patient and are negative.  MEDICAL HISTORY:  Past Medical History:  Diagnosis Date   AKI (acute kidney injury) ([place])    Alcohol abuse    Arthritis    Cataract 2006   Diarrhea    Edema leg    Esophageal cancer ([place])    Family history of breast cancer 12/09/2020   Family history of  ovarian cancer 12/09/2020   Family history of prostate cancer 12/09/2020   GERD (gastroesophageal reflux disease)    Glaucoma unknown   Hyperbilirubinemia    Hyperlipemia    Hypertension    Hypokalemia    Hypothyroidism 12/14/2021   Liver failure ([facility])    Macular degeneration, wet (H[facility])    Microcytic anemia    Osteoarthritis    SOB (shortness of breath)    Weight gain     SURGICAL HISTORY: Past Surgical History:  Procedure Laterality Date   ANKLE SURGERY     BIOPSY  11/11/2020   Procedure: BIOPSY;  Surgeon: [name], MD;  Location: [facility];  Service: Gastroenterology;;   CATARACT EXTRACTION, BILATERAL     ESOPHAGOGASTRODUODENOSCOPY (EGD) WITH PROPOFOL N/A 11/11/2020   Procedure: ESOPHAGOGASTRODUODENOSCOPY (EGD) WITH PROPOFOL;  Surgeon: [name], MD;  Location: [facility];  Service: Gastroenterology;  Laterality: N/A;   EYE SURGERY  2006   FOREIGN BODY REMOVAL  11/11/2020   Procedure: FOREIGN BODY REMOVAL;  Surgeon: [name], MD;  Location: [facility];  Service: Gastroenterology;;   IR IMAGING GUIDED PORT INSERTION  07/16/2021   IR PARACENTESIS  05/09/2020   TONSILLECTOMY      I have reviewed the social history and family history with the patient and they are unchanged from previous note.  ALLERGIES:  is allergic to bee venom and  lisinopril.  MEDICATIONS:  Current Outpatient Medications  Medication Sig Dispense Refill   Azelastine HCl 137 MCG/SPRAY SOLN INSTILL 2 SPRAYS INTO EACH NOSTRIL TWICE A DAY AS DIRECTED (Patient taking differently: Place 2 sprays into the nose 2 (two) times daily as needed (allergies).) 30 mL 1   cefadroxil (DURICEF) 1 g tablet Take 1 tablet (1 g total) by mouth 2 (two) times daily. 8 tablet 0   cetirizine (ZYRTEC) 10 MG tablet Take 20 mg by mouth in the morning.     dorzolamide-timolol (COSOPT) 22.3-6.8 MG/ML ophthalmic solution Place 1 drop into the left eye 2 (two) times daily.     EPINEPHrine 0.3 mg/0.3 mL IJ SOAJ injection  Inject 0.3 mg into the muscle as needed for anaphylaxis.     ezetimibe (ZETIA) 10 MG tablet Take 1 tablet (10 mg total) by mouth daily. 90 tablet 3   Ferrous Sulfate (IRON SLOW RELEASE) 142 (45 Fe) MG TBCR Take 1 tablet by mouth daily. 30 tablet 3   latanoprost (XALATAN) 0.005 % ophthalmic solution Place 1 drop into the left eye at bedtime.     levothyroxine (SYNTHROID) 100 MCG tablet Take 1 tablet (100 mcg total) by mouth daily. 30 tablet 3   lidocaine-prilocaine (EMLA) cream Apply 1 application. topically as needed. 30 g 0   loratadine (CLARITIN) 10 MG tablet Take 10 mg by mouth daily.     Multiple Vitamins-Minerals (ONE-A-DAY MENS 50+) TABS Take 1 tablet by mouth daily.     Multiple Vitamins-Minerals (PRESERVISION AREDS 2 PO) Take 1 tablet by mouth 2 (two) times daily.     spironolactone (ALDACTONE) 50 MG tablet TAKE ONE TABLET BY MOUTH DAILY 90 tablet 0   No current facility-administered medications for this visit.    PHYSICAL EXAMINATION: ECOG PERFORMANCE STATUS: 3 - Symptomatic, >50% confined to bed  Vitals:   01/01/22 1033  BP: 117/61  Pulse: 81  Resp: 16  SpO2: 100%   Wt Readings from Last 3 Encounters:  01/01/22 182 lb 9.6 oz (82.8 kg)  12/30/21 184 lb 9.6 oz (83.7 kg)  12/14/21 182 lb 12.2 oz (82.9 kg)     GENERAL:alert, no distress and comfortable SKIN: skin color, texture, turgor are normal, no rashes or significant lesions EYES: normal, Conjunctiva are pink and non-injected, sclera clear  LUNGS: clear to auscultation and percussion with normal breathing effort HEART: regular rate & rhythm and no murmurs and no lower extremity edema ABDOMEN: (+) moderate fluid present, non tenderness  Musculoskeletal:no cyanosis of digits and no clubbing  NEURO: alert & oriented x 3 with fluent speech, no focal motor/sensory deficits  LABORATORY DATA:  I have reviewed the data as listed    Latest Ref Rng & Units 01/01/2022   10:04 AM 12/30/2021    2:37 PM 12/18/2021    5:46 AM   CBC  WBC 4.0 - 10.5 K/uL 4.7  7.2  9.7   Hemoglobin 13.0 - 17.0 g/dL 7.9  8.4  8.0   Hematocrit 39.0 - 52.0 % 25.1  25.4  25.1   Platelets 150 - 400 K/uL 197  219  129         Latest Ref Rng & Units 01/01/2022   10:04 AM 12/18/2021    5:46 AM 12/17/2021    5:39 AM  CMP  Glucose 70 - 99 mg/dL 105  130  107   BUN 8 - 23 mg/dL _0 Creatinine 0.61 - 1.24 mg/dL 0.97  1.13  1.01  Sodium 135 - 145 mmol/L 137  135  135   Potassium 3.5 - 5.1 mmol/L 4.1  3.2  3.5   Chloride 98 - 111 mmol/L 111  112  111   CO2 22 - 32 mmol/L _0 Calcium 8.9 - 10.3 mg/dL 8.1  7.8  7.6   Total Protein 6.5 - 8.1 g/dL 5.4     Total Bilirubin 0.3 - 1.2 mg/dL 0.9     Alkaline Phos 38 - 126 U/L 83     AST 15 - 41 U/L 41     ALT 0 - 44 U/L 18         RADIOGRAPHIC STUDIES: I have personally reviewed the radiological images as listed and agreed with the findings in the report. No results found.    Orders Placed This Encounter  Procedures   US Paracentesis    Standing Status:   Future    Standing Expiration Date:   01/01/2023    Order Specific Question:   If therapeutic, is there a maximum amount of fluid to be removed?    Answer:   Yes    Order Specific Question:   What is the maximum amount of fluide to be removed?    Answer:   5L    Order Specific Question:   Are labs required for specimen collection?    Answer:   Yes    Order Specific Question:   Lab orders requested (DO NOT place separate lab orders, these will be automatically ordered during procedure specimen collection):    Answer:   Cytology - Non Pap    Order Specific Question:   Is Albumin medication needed?    Answer:   No    Order Specific Question:   Reason for Exam (SYMPTOM  OR DIAGNOSIS REQUIRED)    Answer:   symptom relieve    Order Specific Question:   Preferred imaging location?    Answer:   [facility]   All questions were answered. The patient knows to call the clinic with any problems, questions or  concerns. No barriers to learning was detected. The total time spent in the appointment was 40 minutes.     [name], MD 01/01/2022   I, Wilburn Mylar, am acting as scribe for [name], MD.   I have reviewed the above documentation for accuracy and completeness, and I agree with the above.

## 2022-01-01 NOTE — ACP (Advance Care Planning) (Signed)
Pt has agreed with DNR on 12/14/2021, and confirmed on 01/01/2022.  [name] MD 01/01/2022

## 2022-01-02 ENCOUNTER — Telehealth: Payer: Self-pay | Admitting: Hematology

## 2022-01-02 ENCOUNTER — Ambulatory Visit (HOSPITAL_COMMUNITY)
Admission: RE | Admit: 2022-01-02 | Discharge: 2022-01-02 | Disposition: A | Discharge status: Home / Self Care | Payer: Medicare Other | Source: Ambulatory Visit | Attending: Hematology | Admitting: Hematology

## 2022-01-02 DIAGNOSIS — C787 Secondary malignant neoplasm of liver and intrahepatic bile duct: Secondary | ICD-10-CM | POA: Diagnosis not present

## 2022-01-02 DIAGNOSIS — R188 Other ascites: Secondary | ICD-10-CM | POA: Diagnosis not present

## 2022-01-02 DIAGNOSIS — C159 Malignant neoplasm of esophagus, unspecified: Secondary | ICD-10-CM | POA: Diagnosis not present

## 2022-01-02 MED ORDER — LIDOCAINE HCL 1 % IJ SOLN
INTRAMUSCULAR | Status: AC
  Administered 2022-01-02: 15 mL
  Filled 2022-01-02: qty 20

## 2022-01-02 NOTE — Telephone Encounter (Signed)
Spoke with patient confirming upcoming appointments  

## 2022-01-02 NOTE — Procedures (Signed)
Ultrasound-guided diagnostic and therapeutic paracentesis performed yielding 2.2 liters of hazy, yellow fluid. No immediate complications. A portion of the fluid was sent to the lab for cytology. EBL none.

## 2022-01-05 LAB — CYTOLOGY - NON PAP

## 2022-01-07 ENCOUNTER — Encounter (HOSPITAL_BASED_OUTPATIENT_CLINIC_OR_DEPARTMENT_OTHER): Payer: Self-pay | Admitting: Family Medicine

## 2022-01-07 ENCOUNTER — Ambulatory Visit (INDEPENDENT_AMBULATORY_CARE_PROVIDER_SITE_OTHER): Payer: Medicare Other | Admitting: Family Medicine

## 2022-01-07 VITALS — BP 100/56 | HR 87 | Temp 97.6°F | Ht 71.0 in | Wt 185.0 lb

## 2022-01-07 DIAGNOSIS — Z0000 Encounter for general adult medical examination without abnormal findings: Secondary | ICD-10-CM | POA: Diagnosis not present

## 2022-01-07 DIAGNOSIS — Z23 Encounter for immunization: Secondary | ICD-10-CM | POA: Diagnosis not present

## 2022-01-07 NOTE — Patient Instructions (Signed)
  Medication Instructions:  Your physician recommends that you continue on your current medications as directed. Please refer to the Current Medication list given to you today. --If you need a refill on any your medications before your next appointment, please call your pharmacy first. If no refills are authorized on file call the office.-- Lab Work: Your physician has recommended that you have lab work today: No If you have labs (blood work) drawn today and your tests are completely normal, you will receive your results via [place] a phone call from our staff.  Please ensure you check your voicemail in the event that you authorized detailed messages to be left on a delegated number. If you have any lab test that is abnormal or we need to change your treatment, we will call you to review the results.  Referrals/Procedures/Imaging: No  Follow-Up: Your next appointment:   Your physician recommends that you schedule a follow-up appointment in 2 weeks already scheduled with Dr. [name].  You will receive a text message or e-mail with a link to a survey about your care and experience with [place] today! We would greatly appreciate your feedback!   Thanks for letting us be apart of your health journey!!  Primary Care and Sports Medicine   Dr. Arlina Robes [place]   We encourage you to activate your patient portal called "MyChart".  Sign up information is provided on this After Visit Summary.  MyChart is used to connect with patients for Virtual Visits (Telemedicine).  Patients are able to view lab/test results, encounter notes, upcoming appointments, etc.  Non-urgent messages can be sent to your provider as well. To learn more about what you can do with MyChart, please visit --  [email or web address].

## 2022-01-07 NOTE — Progress Notes (Signed)
Subjective:   Randy Wood is a 81 y.o. male who presents for Medicare Annual/Subsequent preventive examination.  Review of Systems    Yes       Objective:    There were no vitals filed for this visit. There is no height or weight on file to calculate BMI.     12/15/2021   12:00 AM 12/14/2021    5:02 PM 11/20/2021   11:15 AM 09/11/2021    8:10 AM 09/04/2021   10:05 AM 07/16/2021    8:08 AM 01/07/2021    9:07 AM  Advanced Directives  Does Patient Have a Medical Advance Directive? Yes Yes Yes Yes Yes Yes Yes  Type of Industrial/product designer of [employer] of [place];Living will [place];Living will  Does patient want to make changes to medical advance directive? No - Patient declined  No - Patient declined  No - Patient declined No - Patient declined   Copy of [place] in Chart? No - copy requested    Yes - validated most recent copy scanned in chart (See row information)    Would patient like information on creating a medical advance directive?     No - Patient declined No - Patient declined No - Patient declined    Current Medications (verified) Outpatient Encounter Medications as of 01/07/2022  Medication Sig   Azelastine HCl 137 MCG/SPRAY SOLN INSTILL 2 SPRAYS INTO EACH NOSTRIL TWICE A DAY AS DIRECTED (Patient taking differently: Place 2 sprays into the nose 2 (two) times daily as needed (allergies).)   cefadroxil (DURICEF) 1 g tablet Take 1 tablet (1 g total) by mouth 2 (two) times daily.   cetirizine (ZYRTEC) 10 MG tablet Take 20 mg by mouth in the morning.   dorzolamide-timolol (COSOPT) 22.3-6.8 MG/ML ophthalmic solution Place 1 drop into the left eye 2 (two) times daily.   EPINEPHrine 0.3 mg/0.3 mL IJ SOAJ injection Inject 0.3 mg into the muscle as needed for anaphylaxis.   ezetimibe (ZETIA) 10 MG tablet Take 1 tablet (10 mg total) by mouth daily.   Ferrous Sulfate (IRON SLOW  RELEASE) 142 (45 Fe) MG TBCR Take 1 tablet by mouth daily.   latanoprost (XALATAN) 0.005 % ophthalmic solution Place 1 drop into the left eye at bedtime.   levothyroxine (SYNTHROID) 100 MCG tablet Take 1 tablet (100 mcg total) by mouth daily.   lidocaine-prilocaine (EMLA) cream Apply 1 application. topically as needed.   loratadine (CLARITIN) 10 MG tablet Take 10 mg by mouth daily.   Multiple Vitamins-Minerals (ONE-A-DAY MENS 50+) TABS Take 1 tablet by mouth daily.   Multiple Vitamins-Minerals (PRESERVISION AREDS 2 PO) Take 1 tablet by mouth 2 (two) times daily.   spironolactone (ALDACTONE) 50 MG tablet TAKE ONE TABLET BY MOUTH DAILY   No facility-administered encounter medications on file as of 01/07/2022.    Allergies (verified) Bee venom and Lisinopril   History: Past Medical History:  Diagnosis Date   AKI (acute kidney injury) ([place])    Alcohol abuse    Arthritis    Cataract 2006   Diarrhea    Edema leg    Esophageal cancer ([place])    Family history of breast cancer 12/09/2020   Family history of ovarian cancer 12/09/2020   Family history of prostate cancer 12/09/2020   GERD (gastroesophageal reflux disease)    Glaucoma unknown   Hyperbilirubinemia    Hyperlipemia    Hypertension  Hypokalemia    Hypothyroidism 12/14/2021   Liver failure ([facility])    Macular degeneration, wet ([facility])    Microcytic anemia    Osteoarthritis    SOB (shortness of breath)    Weight gain    Past Surgical History:  Procedure Laterality Date   ANKLE SURGERY     BIOPSY  11/11/2020   Procedure: BIOPSY;  Surgeon: [name], MD;  Location: [facility];  Service: Gastroenterology;;   CATARACT EXTRACTION, BILATERAL     ESOPHAGOGASTRODUODENOSCOPY (EGD) WITH PROPOFOL N/A 11/11/2020   Procedure: ESOPHAGOGASTRODUODENOSCOPY (EGD) WITH PROPOFOL;  Surgeon: [name], MD;  Location: [facility];  Service: Gastroenterology;  Laterality: N/A;   EYE SURGERY  2004-06-25   FOREIGN BODY REMOVAL  11/11/2020    Procedure: FOREIGN BODY REMOVAL;  Surgeon: [name], MD;  Location: [facility];  Service: Gastroenterology;;   IR IMAGING GUIDED PORT INSERTION  07/16/2021   IR PARACENTESIS  05/09/2020   TONSILLECTOMY     Family History  Problem Relation Age of Onset   Ovarian cancer Mother 33   Cancer Mother    Prostate cancer Father        dx > 84   Cancer Father    Healthy Brother    Hashimoto's thyroiditis Daughter        youngest daughter   Breast cancer Daughter 73   Stomach cancer Cousin        d. 55; maternal male cousin   Colon cancer Paternal Grandmother        d. 64s   Cancer Daughter    Social History   Socioeconomic History   Marital status: Widowed    Spouse name: Not on file   Number of children: 2   Years of education: Not on file   Highest education level: Bachelor's degree (e.g., BA, AB, BS)  Occupational History   Occupation: retired  Tobacco Use   Smoking status: Former    Packs/day: 2.00    Years: 15.00    Total pack years: 30.00    Types: Cigarettes    Quit date: 03/30/1972    Years since quitting: 49.8   Smokeless tobacco: Never  Vaping Use   Vaping Use: Never used  Substance and Sexual Activity   Alcohol use: Not Currently    Comment: 2-3 drinks/night, none since 04/2020   Drug use: Never   Sexual activity: Not Currently    Birth control/protection: None  Other Topics Concern   Not on file  Social History Narrative   Widowed, wife died GBM 2015/06/26 treated by Dr. Alvy Bimler and Dr. Tammi Klippel    2 daughters, [name] (lives in [place], commutes to [place], primary health contact for pt) and [name]   Right handed   Retired   Drinks coffee 1 cup a day, NO Tea NO soda   Social Determinants of Health   Financial Resource Strain: Low Risk  (12/23/2021)   Overall Financial Resource Strain (CARDIA)    Difficulty of Paying Living Expenses: Not very hard  Food Insecurity: No Food Insecurity (12/15/2021)   Hunger Vital Sign    Worried About Running Out of Food  in the Last Year: Never true    [place] in the Last Year: Never true  Transportation Needs: No Transportation Needs (12/23/2021)   PRAPARE - Hydrologist (Medical): No    Lack of Transportation (Non-Medical): No  Physical Activity: Not on file  Stress: Not on file  Social Connections: Not on file  Tobacco Counseling Counseling given: Not Answered   Clinical Intake:  Diabetic? No   Activities of Daily Living    12/30/2021   11:13 AM 12/15/2021   12:00 AM  In your present state of health, do you have any difficulty performing the following activities:  Hearing? 0 0  Vision? 0 1  Difficulty concentrating or making decisions? 0 0  Walking or climbing stairs? 0 0  Dressing or bathing? 0 0  Doing errands, shopping? 0 1    Patient Care Team: [name], [name], MD as PCP - General (Family Medicine) [name], MD as Consulting Physician (Gastroenterology) [name], MD as Consulting Physician (Hematology) [name], NP as Nurse Practitioner (Nurse Practitioner) Ander Slade, Carlisle Beers, MD (Ophthalmology) [facility] as Consulting Physician [facility] and Palliative Medicine)  Indicate any recent Medical Services you may have received from other than [facility] providers in the past year (date may be approximate).     Assessment:   This is a routine wellness examination for [place].  Hearing/Vision screen No results found.  Dietary issues and exercise activities discussed:     Goals Addressed   None   Depression Screen    12/30/2021   11:13 AM 07/08/2021    1:11 PM 12/12/2020    3:00 PM  PHQ 2/9 Scores  PHQ - 2 Score 0 0 0  PHQ- 9 Score 0    Exception Documentation Medical reason      Fall Risk    12/30/2021   11:13 AM 07/08/2021    1:10 PM 03/06/2019    9:56 AM  [place] in the past year? 0 0 1  Number falls in past yr: 0 0 1  Injury with Fall? 0 0 0  Risk for fall due to : No Fall Risks No Fall Risks    Follow up Falls evaluation completed Falls evaluation completed     [place]:  Any stairs in or around the home? Yes  If so, are there any without handrails? No  Home free of loose throw rugs in walkways, pet beds, electrical cords, etc? No  Adequate lighting in your home to reduce risk of falls? Yes   ASSISTIVE DEVICES UTILIZED TO PREVENT FALLS:  Life alert? Yes  Use of a cane, walker or w/c? Yes  Grab bars in the bathroom? Yes  Shower chair or bench in shower? Yes  Elevated toilet seat or a handicapped toilet? Yes   TIMED UP AND GO:  Was the test performed? NO.  Length of time to ambulate 10 feet: No sec.   Gait steady and fast with assistive device  Cognitive Function:        Immunizations Immunization History  Administered Date(s) Administered   Influenza Inj Mdck Quad Pf 02/03/2017   Influenza Split 12/26/2008, 02/28/2010, 12/29/2010, 12/18/2011, 12/28/2012, 12/28/2013, 12/28/2014, 01/03/2016, 12/28/2016, 12/26/2018   Influenza, High Dose Seasonal PF 01/20/2018   Influenza-Unspecified 02/24/2020, 12/23/2020   PFIZER(Purple Top)SARS-COV-2 Vaccination 04/20/2019, 05/11/2019, 12/27/2019, 07/06/2020   Pfizer Covid-19 Vaccine Bivalent Booster 67yr & up 12/23/2020   Pneumococcal Conjugate-13 09/14/2013   Pneumococcal Polysaccharide-23 03/30/2005   Td 03/30/2004   Tdap 09/12/2015   Zoster Recombinat (Shingrix) 01/23/2019   Zoster, Live 03/30/2005, 01/23/2019, 07/25/2019   TDAP status: Up to date  Flu Vaccine status: Due, Education has been provided regarding the importance of this vaccine. Advised may receive this vaccine at local pharmacy or Health Dept. Aware to provide a copy of the  vaccination record if obtained from local pharmacy or Health Dept. Verbalized acceptance and understanding.  Pneumococcal vaccine status: Up to date  Covid-19 vaccine status: Completed vaccines  Qualifies for Shingles Vaccine? Yes   Zostavax  completed Yes   Shingrix Completed?: Yes  Screening Tests Health Maintenance  Topic Date Due   Zoster Vaccines- Shingrix (2 of 2) 03/20/2019   COVID-19 Vaccine (6 - Pfizer risk series) 02/17/2021   INFLUENZA VACCINE  02/07/2022 (Originally 10/28/2021)   TETANUS/TDAP  09/11/2025   Pneumonia Vaccine 56+ Years old  Completed   HPV VACCINES  Aged Out    Health Maintenance  Health Maintenance Due  Topic Date Due   Zoster Vaccines- Shingrix (2 of 2) 03/20/2019   COVID-19 Vaccine (6 - Pfizer risk series) 02/17/2021    Colorectal cancer screening: No longer required.   Lung Cancer Screening: (Low Dose CT Chest recommended if Age 23-80 years, 30 pack-year currently smoking OR have quit w/in 15years.) does not qualify.   Lung Cancer Screening Referral: No  Additional Screening:  Hepatitis C Screening: does not qualify; Completed   Vision Screening: Recommended annual ophthalmology exams for early detection of glaucoma and other disorders of the eye. Is the patient up to date with their annual eye exam?  Yes  Who is the provider or what is the name of the office in which the patient attends annual eye exams? Dr. Rodman Key If pt is not established with a provider, would they like to be referred to a provider to establish care? No .   Dental Screening: Recommended annual dental exams for proper oral hygiene  Community Resource Referral / Chronic Care Management: CRR required this visit?  No   CCM required this visit?  No      Plan:     I have personally reviewed and noted the following in the patient's chart:   Medical and social history Use of alcohol, tobacco or illicit drugs  Current medications and supplements including opioid prescriptions. Patient is not currently taking opioid prescriptions. Functional ability and status Nutritional status Physical activity Advanced directives List of other physicians Hospitalizations, surgeries, and ER visits in previous 12  months Vitals Screenings to include cognitive, depression, and falls Referrals and appointments  In addition, I have reviewed and discussed with patient certain preventive protocols, quality metrics, and best practice recommendations. A written personalized care plan for preventive services as well as general preventive health recommendations were provided to patient.     Lowella Bandy, [place]   01/07/2022   Nurse Notes: Completed  Reviewed above note, discussed further with patient. No acute concerns at this time. Plan for follow-up at previously scheduled appointment

## 2022-01-08 ENCOUNTER — Other Ambulatory Visit: Payer: Self-pay

## 2022-01-13 ENCOUNTER — Ambulatory Visit: Payer: Medicare Other | Admitting: Physician Assistant

## 2022-01-13 ENCOUNTER — Other Ambulatory Visit: Payer: Medicare Other

## 2022-01-15 ENCOUNTER — Ambulatory Visit: Payer: Medicare Other

## 2022-01-15 ENCOUNTER — Ambulatory Visit: Payer: Medicare Other | Admitting: Nurse Practitioner

## 2022-01-15 ENCOUNTER — Other Ambulatory Visit: Payer: Medicare Other

## 2022-01-19 ENCOUNTER — Inpatient Hospital Stay: Payer: Medicare Other

## 2022-01-19 ENCOUNTER — Ambulatory Visit (HOSPITAL_BASED_OUTPATIENT_CLINIC_OR_DEPARTMENT_OTHER): Payer: Medicare Other | Admitting: Family Medicine

## 2022-01-19 ENCOUNTER — Ambulatory Visit (INDEPENDENT_AMBULATORY_CARE_PROVIDER_SITE_OTHER): Payer: Medicare Other

## 2022-01-19 ENCOUNTER — Ambulatory Visit (INDEPENDENT_AMBULATORY_CARE_PROVIDER_SITE_OTHER): Payer: Medicare Other | Admitting: Family Medicine

## 2022-01-19 VITALS — BP 117/60 | HR 82 | Temp 98.2°F | Resp 18 | Ht 71.0 in | Wt 185.1 lb

## 2022-01-19 VITALS — BP 121/58 | HR 88 | Ht 71.0 in | Wt 186.2 lb

## 2022-01-19 DIAGNOSIS — E876 Hypokalemia: Secondary | ICD-10-CM

## 2022-01-19 DIAGNOSIS — D649 Anemia, unspecified: Secondary | ICD-10-CM | POA: Diagnosis not present

## 2022-01-19 DIAGNOSIS — Z79899 Other long term (current) drug therapy: Secondary | ICD-10-CM | POA: Diagnosis not present

## 2022-01-19 DIAGNOSIS — J90 Pleural effusion, not elsewhere classified: Secondary | ICD-10-CM | POA: Diagnosis not present

## 2022-01-19 DIAGNOSIS — R0602 Shortness of breath: Secondary | ICD-10-CM

## 2022-01-19 DIAGNOSIS — Z95828 Presence of other vascular implants and grafts: Secondary | ICD-10-CM

## 2022-01-19 DIAGNOSIS — C787 Secondary malignant neoplasm of liver and intrahepatic bile duct: Secondary | ICD-10-CM | POA: Diagnosis not present

## 2022-01-19 DIAGNOSIS — C155 Malignant neoplasm of lower third of esophagus: Secondary | ICD-10-CM | POA: Diagnosis not present

## 2022-01-19 MED ORDER — SODIUM CHLORIDE 0.9 % IV SOLN: Freq: Once | INTRAVENOUS | Status: AC

## 2022-01-19 MED ORDER — SODIUM CHLORIDE 0.9 % IV SOLN
250.0000 mg | Freq: Once | INTRAVENOUS | Status: AC
  Administered 2022-01-19: 250 mg via INTRAVENOUS
  Filled 2022-01-19: qty 20

## 2022-01-19 MED ORDER — SODIUM CHLORIDE 0.9% FLUSH
10.0000 mL | Freq: Once | INTRAVENOUS | Status: AC
  Administered 2022-01-19: 10 mL

## 2022-01-19 MED ORDER — CETIRIZINE HCL 10 MG/ML IV SOLN
10.0000 mg | Freq: Once | INTRAVENOUS | Status: AC
  Administered 2022-01-19: 10 mg via INTRAVENOUS
  Filled 2022-01-19: qty 1

## 2022-01-19 MED ORDER — HEPARIN SOD (PORK) LOCK FLUSH 100 UNIT/ML IV SOLN
500.0000 [IU] | Freq: Once | INTRAVENOUS | Status: AC
  Administered 2022-01-19: 500 [IU]

## 2022-01-19 NOTE — Progress Notes (Signed)
    Procedures performed today:    None.  Independent interpretation of notes and tests performed by another provider:   None.  Brief History, Exam, Impression, and Recommendations:    BP (!) 121/58 (BP Location: Left Arm, Patient Position: Sitting, Cuff Size: Normal)   Pulse 88   Ht '5\' 11"'$  (1.803 m)   Wt 186 lb 3.2 oz (84.5 kg)   SpO2 100%   BMI 25.97 kg/m   Shortness of breath Patient reporting some intermittent shortness of breath, most noticeable with exertion, primarily with doing activities around the house, walking over short distances.  This has generally been going on for some time, possibly more noticeable over the last few weeks and months.  He does have some swelling in his lower extremities which has been present previously, has possibly had some progression in regards to this.  He has felt that he has had some intermittent wheezing as well.  No specific cough or sputum production.  In the past, he had been on Lasix related to blood pressure and volume overload, however this was discontinued due to hypokalemia.  Last visit with cardiology was about 4 months ago, follow-up is planned in about 6 weeks for a 6-monthfollow-up.  He does continue with spironolactone at present On exam today, vital signs are stable, patient is afebrile.  Cardiovascular exam with regular rate and rhythm.  Lungs are clear to auscultation bilaterally, however within lower lung fields, do feel that breath sounds are diminished.  No crackles or wheezing appreciated at this time.  Patient does have bilateral pitting edema in lower extremities. Discussed potential causes for his shortness of breath which does include cardiac source/potential intravascular volume overload.  Also discussed potential pulmonary etiologies.  At this time, we will proceed with initial chest x-ray for further assessment to observe for any pleural effusion, acute cardiac or pulmonary changes.  Discussed that if it does appear the  patient has evidence of fluid overload, may need to consider short course of diuresis in order to remove some excess fluid which would be expected to help with recent shortness of breath We will await results of imaging to determine next steps  Return if symptoms worsen or fail to improve.  Patient will return for follow-up at previously scheduled appointment which is in about 3 months   ___________________________________________ Ausencio Vaden de [place] MD, ABFM, CAQSM Primary [facility] and [place]

## 2022-01-19 NOTE — Assessment & Plan Note (Signed)
Patient reporting some intermittent shortness of breath, most noticeable with exertion, primarily with doing activities around the house, walking over short distances.  This has generally been going on for some time, possibly more noticeable over the last few weeks and months.  He does have some swelling in his lower extremities which has been present previously, has possibly had some progression in regards to this.  He has felt that he has had some intermittent wheezing as well.  No specific cough or sputum production.  In the past, he had been on Lasix related to blood pressure and volume overload, however this was discontinued due to hypokalemia.  Last visit with cardiology was about 4 months ago, follow-up is planned in about 6 weeks for a 67-monthfollow-up.  He does continue with spironolactone at present On exam today, vital signs are stable, patient is afebrile.  Cardiovascular exam with regular rate and rhythm.  Lungs are clear to auscultation bilaterally, however within lower lung fields, do feel that breath sounds are diminished.  No crackles or wheezing appreciated at this time.  Patient does have bilateral pitting edema in lower extremities. Discussed potential causes for his shortness of breath which does include cardiac source/potential intravascular volume overload.  Also discussed potential pulmonary etiologies.  At this time, we will proceed with initial chest x-ray for further assessment to observe for any pleural effusion, acute cardiac or pulmonary changes.  Discussed that if it does appear the patient has evidence of fluid overload, may need to consider short course of diuresis in order to remove some excess fluid which would be expected to help with recent shortness of breath We will await results of imaging to determine next steps

## 2022-01-19 NOTE — Patient Instructions (Signed)
Sodium Ferric Gluconate Complex Injection What is this medication? SODIUM FERRIC GLUCONATE COMPLEX (SOE dee um FER ik GLOO koe nate KOM pleks) treats low levels of iron (iron deficiency anemia) in people with kidney disease. Iron is a mineral that plays an important role in making red blood cells, which carry oxygen from your lungs to the rest of your body. This medicine may be used for other purposes; ask your health care provider or pharmacist if you have questions. COMMON BRAND NAME(S): Ferrlecit, Nulecit What should I tell my care team before I take this medication? They need to know if you have any of the following conditions: Anemia that is not from iron deficiency High levels of iron in the blood An unusual or allergic reaction to iron, other medications, foods, dyes, or preservatives Pregnant or are trying to become pregnant Breast-feeding How should I use this medication? This medication is injected into a vein. It is given by your care team in a hospital or clinic setting. Talk to your care team about the use of this medication in children. While it may be prescribed for children as young as 6 years for selected conditions, precautions do apply. Overdosage: If you think you have taken too much of this medicine contact a poison control center or emergency room at once. NOTE: This medicine is only for you. Do not share this medicine with others. What if I miss a dose? It is important not to miss your dose. Call your care team if you are unable to keep an appointment. What may interact with this medication? Do not take this medication with any of the following: Deferasirox Deferoxamine Dimercaprol This medication may also interact with the following: Other iron products This list may not describe all possible interactions. Give your health care provider a list of all the medicines, herbs, non-prescription drugs, or dietary supplements you use. Also tell them if you smoke, drink  alcohol, or use illegal drugs. Some items may interact with your medicine. What should I watch for while using this medication? Your condition will be monitored carefully while you are receiving this medication. Visit your care team for regular checks on your progress. You may need blood work while you are taking this medication. What side effects may I notice from receiving this medication? Side effects that you should report to your care team as soon as possible: Allergic reactions--skin rash, itching, hives, swelling of the face, lips, tongue, or throat Low blood pressure--dizziness, feeling faint or lightheaded, blurry vision Shortness of breath Side effects that usually do not require medical attention (report to your care team if they continue or are bothersome): Flushing Headache Joint pain Muscle pain Nausea Pain, redness, or irritation at injection site This list may not describe all possible side effects. Call your doctor for medical advice about side effects. You may report side effects to FDA at 1-800-FDA-1088. Where should I keep my medication? This medication is given in a hospital or clinic and will not be stored at home. NOTE: This sheet is a summary. It may not cover all possible information. If you have questions about this medicine, talk to your doctor, pharmacist, or health care provider.  2023 Elsevier/Gold Standard (2020-08-09 00:00:00)  

## 2022-01-19 NOTE — Patient Instructions (Signed)
  Medication Instructions:  Your physician recommends that you continue on your current medications as directed. Please refer to the Current Medication list given to you today. --If you need a refill on any your medications before your next appointment, please call your pharmacy first. If no refills are authorized on file call the office.-- Lab Work: Your physician has recommended that you have lab work today: No lab work today! If you have labs (blood work) drawn today and your tests are completely normal, you will receive your results via [place] a phone call from our staff.  Please ensure you check your voicemail in the event that you authorized detailed messages to be left on a delegated number. If you have any lab test that is abnormal or we need to change your treatment, we will call you to review the results.  Referrals/Procedures/Imaging: Chest X-Ray on 2nd Floor  Follow-Up: Your next appointment:   Your physician recommends that you schedule a follow-up appointment in: As needed with Dr. [name]  You will receive a text message or e-mail with a link to a survey about your care and experience with [place] today! We would greatly appreciate your feedback!   Thanks for letting us be apart of your health journey!!  Primary Care and Sports Medicine   Dr. Arlina Robes [place]   We encourage you to activate your patient portal called "MyChart".  Sign up information is provided on this After Visit Summary.  MyChart is used to connect with patients for Virtual Visits (Telemedicine).  Patients are able to view lab/test results, encounter notes, upcoming appointments, etc.  Non-urgent messages can be sent to your provider as well. To learn more about what you can do with MyChart, please visit --  [email or web address].

## 2022-01-21 ENCOUNTER — Other Ambulatory Visit: Payer: Self-pay

## 2022-01-21 ENCOUNTER — Encounter: Payer: Self-pay | Admitting: Internal Medicine

## 2022-01-21 ENCOUNTER — Ambulatory Visit (INDEPENDENT_AMBULATORY_CARE_PROVIDER_SITE_OTHER): Payer: Medicare Other | Admitting: Internal Medicine

## 2022-01-21 VITALS — BP 104/66 | HR 83 | Ht 71.0 in | Wt 187.0 lb

## 2022-01-21 DIAGNOSIS — E032 Hypothyroidism due to medicaments and other exogenous substances: Secondary | ICD-10-CM

## 2022-01-21 DIAGNOSIS — C159 Malignant neoplasm of esophagus, unspecified: Secondary | ICD-10-CM

## 2022-01-21 DIAGNOSIS — C155 Malignant neoplasm of lower third of esophagus: Secondary | ICD-10-CM

## 2022-01-21 NOTE — Patient Instructions (Signed)

## 2022-01-21 NOTE — Progress Notes (Signed)
Name: Randy Wood  MRN/ DOB: [id number], [birth year]    Age/ Sex: 81 y.o., male    PCP: [name], [name], MD   Reason for Endocrinology Evaluation: Hyperthyroidism     Date of Initial Endocrinology Evaluation: 06/11/2021    HPI: Randy Wood is a 81 y.o. male with a past medical history of HTN, GERD, IBS, cirrhosis and adenocarcinoma of the esophagus. The patient presented for initial endocrinology clinic visit on 06/11/2021 for consultative assistance with his Hyperthyroidism.   Patient has been noted with suppressed TSH on 05/07/2021 at < 0.080 uIU/mL and elevated total T4 at 17.5 UG/DL.  This was attributed to nivolumab which was held 06/05/2021  Of note, the patient follows with oncology for metastatic adenocarcinoma of the distal esophagus, which was diagnosed 10/2020.  He received palliative radiation to the primary tumor in 11/2020 He is on Xeloda(Capecitabine ) and nivolumab which was started 12/2020    He was on half a dose of nivolumab until 03/2021 when he went to a full dose and that is when his hyperthyroidism was noted.  Nivolumab was held with normalization of TFTs.  But by April 2023 his TSH was elevated at 70.63 u IU/mL and he was started on levothyroxine  Chemotherapeutic agents were changed to Taxol and Ramucizumab 06/2021  No prior hx of thyroid disease  Daughter with Hashimoto's disease  Maternal grand mother with thyroid disease      SUBJECTIVE:    Today (01/21/22):  [name] is here for follow-up on thyroid disease.He is accompanied by his daughter Randy Wood    Patient was hospitalized for sepsis due to E. coli in September 2023, his levothyroxine dose was increased He was recently seen by his PCP for shortness of breath  He received iron infusion 01/19/2022 He continues to follow-up with oncology for esophageal cancer 01/01/2022, chemotherapy was put on hold due to deconditioning and new onset ascites  Weight has been stable  Appetite has been low  but improving  Denies palpitations  Constipation has been improving  Denies tremors  Denies local neck symptoms  Has noted cough at night after eating     Levothyroxine 100 mcg daily    HISTORY:  Past Medical History:  Past Medical History:  Diagnosis Date   AKI (acute kidney injury) ([place])    Alcohol abuse    Arthritis    Cataract 2006   Diarrhea    Edema leg    Esophageal cancer ([place])    Family history of breast cancer 12/09/2020   Family history of ovarian cancer 12/09/2020   Family history of prostate cancer 12/09/2020   GERD (gastroesophageal reflux disease)    Glaucoma unknown   Hyperbilirubinemia    Hyperlipemia    Hypertension    Hypokalemia    Hypothyroidism 12/14/2021   Liver failure ([place])    Macular degeneration, wet ([place])    Microcytic anemia    Osteoarthritis    SOB (shortness of breath)    Weight gain    Past Surgical History:  Past Surgical History:  Procedure Laterality Date   ANKLE SURGERY     BIOPSY  11/11/2020   Procedure: BIOPSY;  Surgeon: [name], MD;  Location: [facility];  Service: Gastroenterology;;   CATARACT EXTRACTION, BILATERAL     ESOPHAGOGASTRODUODENOSCOPY (EGD) WITH PROPOFOL N/A 11/11/2020   Procedure: ESOPHAGOGASTRODUODENOSCOPY (EGD) WITH PROPOFOL;  Surgeon: [name], MD;  Location: [facility];  Service: Gastroenterology;  Laterality: N/A;   EYE SURGERY  2006  FOREIGN BODY REMOVAL  11/11/2020   Procedure: FOREIGN BODY REMOVAL;  Surgeon: [name], MD;  Location: [facility];  Service: Gastroenterology;;   IR IMAGING GUIDED PORT INSERTION  07/16/2021   IR PARACENTESIS  05/09/2020   TONSILLECTOMY      Social History:  reports that he quit smoking about 49 years ago. His smoking use included cigarettes. He has a 30.00 pack-year smoking history. He has never used smokeless tobacco. He reports that he does not currently use alcohol. He reports that he does not use drugs. Family History: family history includes Breast  cancer (age of onset: 55) in his daughter; Cancer in his daughter, father, and mother; Colon cancer in his paternal grandmother; Hashimoto's thyroiditis in his daughter; Healthy in his brother; Ovarian cancer (age of onset: 76) in his mother; Prostate cancer in his father; Stomach cancer in his cousin.   HOME MEDICATIONS: Allergies as of 01/21/2022       Reactions   Bee Venom Swelling   Lisinopril Cough        Medication List        Accurate as of January 21, 2022  7:38 AM. If you have any questions, ask your nurse or doctor.          Azelastine HCl 137 MCG/SPRAY Soln INSTILL 2 SPRAYS INTO EACH NOSTRIL TWICE A DAY AS DIRECTED What changed: See the new instructions.   cefadroxil 1 g tablet Commonly known as: DURICEF Take 1 tablet (1 g total) by mouth 2 (two) times daily.   cetirizine 10 MG tablet Commonly known as: ZYRTEC Take 20 mg by mouth in the morning.   dorzolamide-timolol 2-0.5 % ophthalmic solution Commonly known as: COSOPT Place 1 drop into the left eye 2 (two) times daily.   EPINEPHrine 0.3 mg/0.3 mL Soaj injection Commonly known as: EPI-PEN Inject 0.3 mg into the muscle as needed for anaphylaxis.   ezetimibe 10 MG tablet Commonly known as: ZETIA Take 1 tablet (10 mg total) by mouth daily.   Iron Slow Release 142 (45 Fe) MG Tbcr Generic drug: Ferrous Sulfate Take 1 tablet by mouth daily.   latanoprost 0.005 % ophthalmic solution Commonly known as: XALATAN Place 1 drop into the left eye at bedtime.   levothyroxine 100 MCG tablet Commonly known as: SYNTHROID Take 1 tablet (100 mcg total) by mouth daily.   lidocaine-prilocaine cream Commonly known as: EMLA Apply 1 application. topically as needed.   loratadine 10 MG tablet Commonly known as: CLARITIN Take 10 mg by mouth daily.   One-A-Day Mens 50+ Tabs Take 1 tablet by mouth daily.   PRESERVISION AREDS 2 PO Take 1 tablet by mouth 2 (two) times daily.   spironolactone 50 MG  tablet Commonly known as: ALDACTONE TAKE ONE TABLET BY MOUTH DAILY          REVIEW OF SYSTEMS: A comprehensive ROS was conducted with the patient and is negative except as per HPI     OBJECTIVE:  VS: BP 104/66 (BP Location: Left Arm, Patient Position: Sitting, Cuff Size: Small)   Pulse 83   Ht '5\' 11"'$  (1.803 m)   Wt 187 lb (84.8 kg)   SpO2 92%   BMI 26.08 kg/m     Wt Readings from Last 3 Encounters:  01/19/22 186 lb 3.2 oz (84.5 kg)  01/19/22 185 lb 2 oz (84 kg)  01/07/22 185 lb (83.9 kg)     EXAM: General: Pt appears well and is in NAD  Eyes: External eye exam normal without  stare, lid lag or exophthalmos.  EOM intact.  PERRL.  Neck: General: Supple without adenopathy. Thyroid: Thyroid size normal.  No goiter or nodules appreciated.  Lungs: Clear with good BS bilat with no rales, rhonchi, or wheezes  Heart: Auscultation: RRR.  Extremities:  BL LE: No pretibial edema normal ROM and strength.  Mental Status: Judgment, insight: Intact Orientation: Oriented to time, place, and person Mood and affect: No depression, anxiety, or agitation     DATA REVIEWED:    Latest Reference Range & Units 01/22/22 12:02  TSH 0.350 - 4.500 uIU/mL 36.289 (H)  T4,Free(Direct) 0.61 - 1.12 ng/dL 1.19 (H)  (H): Data is abnormally high   Latest Reference Range & Units Most Recent  Thyroperoxidase Ab SerPl-aCnc <9 IU/mL 10 (H) 07/15/21 14:15   ASSESSMENT/PLAN/RECOMMENDATIONS:   Hypothyroidism :  -Initially had hyperthyroidism followed by Hypothyroidism  - Has been on LT-4 replacement since 06/2021 - He takes it appropriately  - He will have TFT's checked in 6-8 weeks through oncology  - TSh trending down but continues to be above goal    Medication  Stop Levothyroxine 100 mcg daily  Start Levothyroxine 125 mcg daily     Follow-up in 4 months  Signed electronically by: Mack Guise, MD  [facility] Endocrinology  [facility] [address]  [place], [place] [address] Phone: [phone] FAX: [phone]   CC: [name], [place], MD [address] [place] [address] Phone: [phone] Fax: [phone]   Return to Endocrinology clinic as below: Future Appointments  Date Time Provider [place]  01/21/2022 11:10 AM Jaye Polidori, Melanie Crazier, MD [facility] None  01/22/2022 11:45 AM [facility] None  01/22/2022 12:20 PM [name], MD [place] None  02/02/2022 12:15 PM Hayden Pedro, MD [facility] None  03/06/2022 10:40 AM Freada Bergeron, MD [facility]  03/30/2022  9:50 AM [name], Raymond J, MD [facility]

## 2022-01-22 ENCOUNTER — Other Ambulatory Visit: Payer: Self-pay

## 2022-01-22 ENCOUNTER — Inpatient Hospital Stay: Payer: Medicare Other

## 2022-01-22 ENCOUNTER — Inpatient Hospital Stay (HOSPITAL_BASED_OUTPATIENT_CLINIC_OR_DEPARTMENT_OTHER): Payer: Medicare Other | Admitting: Nurse Practitioner

## 2022-01-22 ENCOUNTER — Inpatient Hospital Stay (HOSPITAL_BASED_OUTPATIENT_CLINIC_OR_DEPARTMENT_OTHER): Payer: Medicare Other | Admitting: Hematology

## 2022-01-22 ENCOUNTER — Encounter (HOSPITAL_COMMUNITY): Payer: Self-pay

## 2022-01-22 ENCOUNTER — Ambulatory Visit (HOSPITAL_COMMUNITY): Admission: RE | Admit: 2022-01-22 | Payer: Medicare Other | Source: Ambulatory Visit

## 2022-01-22 ENCOUNTER — Encounter: Payer: Self-pay | Admitting: Hematology

## 2022-01-22 VITALS — BP 115/60 | HR 92 | Temp 97.5°F | Resp 18 | Ht 71.0 in | Wt 185.1 lb

## 2022-01-22 DIAGNOSIS — R630 Anorexia: Secondary | ICD-10-CM | POA: Diagnosis not present

## 2022-01-22 DIAGNOSIS — C787 Secondary malignant neoplasm of liver and intrahepatic bile duct: Secondary | ICD-10-CM

## 2022-01-22 DIAGNOSIS — C159 Malignant neoplasm of esophagus, unspecified: Secondary | ICD-10-CM

## 2022-01-22 DIAGNOSIS — E876 Hypokalemia: Secondary | ICD-10-CM

## 2022-01-22 DIAGNOSIS — D649 Anemia, unspecified: Secondary | ICD-10-CM

## 2022-01-22 DIAGNOSIS — C155 Malignant neoplasm of lower third of esophagus: Secondary | ICD-10-CM

## 2022-01-22 DIAGNOSIS — Z7189 Other specified counseling: Secondary | ICD-10-CM | POA: Diagnosis not present

## 2022-01-22 DIAGNOSIS — Z95828 Presence of other vascular implants and grafts: Secondary | ICD-10-CM

## 2022-01-22 DIAGNOSIS — Z515 Encounter for palliative care: Secondary | ICD-10-CM

## 2022-01-22 DIAGNOSIS — R531 Weakness: Secondary | ICD-10-CM | POA: Diagnosis not present

## 2022-01-22 DIAGNOSIS — Z79899 Other long term (current) drug therapy: Secondary | ICD-10-CM | POA: Diagnosis not present

## 2022-01-22 LAB — TSH: TSH: 36.289 u[IU]/mL — ABNORMAL HIGH (ref 0.350–4.500)

## 2022-01-22 LAB — CBC WITH DIFFERENTIAL (CANCER CENTER ONLY)
Abs Immature Granulocytes: 0.02 10*3/uL (ref 0.00–0.07)
Basophils Absolute: 0 10*3/uL (ref 0.0–0.1)
Basophils Relative: 1 %
Eosinophils Absolute: 0.2 10*3/uL (ref 0.0–0.5)
Eosinophils Relative: 2 %
HCT: 21.9 % — ABNORMAL LOW (ref 39.0–52.0)
Hemoglobin: 7 g/dL — ABNORMAL LOW (ref 13.0–17.0)
Immature Granulocytes: 0 %
Lymphocytes Relative: 20 %
Lymphs Abs: 1.3 10*3/uL (ref 0.7–4.0)
MCH: 32 pg (ref 26.0–34.0)
MCHC: 32 g/dL (ref 30.0–36.0)
MCV: 100 fL (ref 80.0–100.0)
Monocytes Absolute: 0.7 10*3/uL (ref 0.1–1.0)
Monocytes Relative: 10 %
Neutro Abs: 4.6 10*3/uL (ref 1.7–7.7)
Neutrophils Relative %: 67 %
Platelet Count: 194 10*3/uL (ref 150–400)
RBC: 2.19 MIL/uL — ABNORMAL LOW (ref 4.22–5.81)
RDW: 19.1 % — ABNORMAL HIGH (ref 11.5–15.5)
WBC Count: 6.8 10*3/uL (ref 4.0–10.5)
nRBC: 0.3 % — ABNORMAL HIGH (ref 0.0–0.2)

## 2022-01-22 LAB — CMP (CANCER CENTER ONLY)
ALT: 15 U/L (ref 0–44)
AST: 25 U/L (ref 15–41)
Albumin: 2.9 g/dL — ABNORMAL LOW (ref 3.5–5.0)
Alkaline Phosphatase: 83 U/L (ref 38–126)
Anion gap: 7 (ref 5–15)
BUN: 26 mg/dL — ABNORMAL HIGH (ref 8–23)
CO2: 18 mmol/L — ABNORMAL LOW (ref 22–32)
Calcium: 8.1 mg/dL — ABNORMAL LOW (ref 8.9–10.3)
Chloride: 112 mmol/L — ABNORMAL HIGH (ref 98–111)
Creatinine: 1.15 mg/dL (ref 0.61–1.24)
GFR, Estimated: >60 mL/min (ref >60)
Glucose, Bld: 129 mg/dL — ABNORMAL HIGH (ref 70–99)
Potassium: 4.2 mmol/L (ref 3.5–5.1)
Sodium: 137 mmol/L (ref 135–145)
Total Bilirubin: 0.7 mg/dL (ref 0.3–1.2)
Total Protein: 5.1 g/dL — ABNORMAL LOW (ref 6.5–8.1)

## 2022-01-22 LAB — IRON AND IRON BINDING CAPACITY (CC-WL,HP ONLY)
Iron: 54 ug/dL (ref 45–182)
Saturation Ratios: 20 % (ref 17.9–39.5)
TIBC: 276 ug/dL (ref 250–450)
UIBC: 222 ug/dL (ref 117–376)

## 2022-01-22 LAB — PREPARE RBC (CROSSMATCH)

## 2022-01-22 LAB — AMMONIA: Ammonia: 50 umol/L — ABNORMAL HIGH (ref 9–35)

## 2022-01-22 LAB — T4, FREE: Free T4: 1.19 ng/dL — ABNORMAL HIGH (ref 0.61–1.12)

## 2022-01-22 LAB — TOTAL PROTEIN, URINE DIPSTICK: Protein, ur: <30 mg/dL — AB

## 2022-01-22 MED ORDER — HEPARIN SOD (PORK) LOCK FLUSH 100 UNIT/ML IV SOLN
500.0000 [IU] | Freq: Once | INTRAVENOUS | Status: AC
  Administered 2022-01-22: 500 [IU]

## 2022-01-22 MED ORDER — SODIUM CHLORIDE 0.9% FLUSH
10.0000 mL | Freq: Once | INTRAVENOUS | Status: AC
  Administered 2022-01-22: 10 mL

## 2022-01-22 NOTE — Progress Notes (Signed)
[place]   [phone] [phone]   Clinic Follow up Note   Patient Care Team: [name], [name], MD as PCP - General (Family Medicine) [name], MD as Consulting Physician (Gastroenterology) [name], MD as Consulting Physician (Hematology) [name], NP as Nurse Practitioner (Nurse Practitioner) Ander Slade, Carlisle Beers, MD (Ophthalmology) [facility] as Consulting Physician [facility] and Palliative Medicine)  Date of Service:  01/22/2022  CHIEF COMPLAINT: f/u of esophageal cancer  CURRENT THERAPY:  Surveillance  ASSESSMENT & PLAN:  Randy Wood is a 81 y.o. male with   1. 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 Molecular testing showed MMR normal, PD-L1 CPS 3%. -PET 12/03/20 showed: esophageal tumor with local perigastric infiltration; hepatic and nodal metastasis involving the chest and abdomen extending to lower retroperitoneum; burst fracture at L5 with increased metabolic activity -s/p palliative radiation to primary tumor 9/15 - 12/27/20 -he took Xeloda and nivolumab q14d 01/07/21 - 05/2021. He was switched to taxol 07/17/21 due to worsening nodal disease on CT. -restaging CT CAP on 10/21/21 showed treatment response. -he was admitted 9/17 - 12/18/21 for E coli infection, UTI with retention, severe neutropenia, and new right pleural effusion and ascites.  -he had paracentesis on 9/21 and 01/02/22, cytology was negative.  -He has been doing poorly since the recent hospital admission, very fatigued, with low appetite, pulmonary status has dropped significantly. -I again reviewed with them that he is not a candidate for  additional chemotherapy.  I discussed palliative care and hospice, and encouraged him to consider.  Both his daughters has been communicating with [place] that he is doing poorly, and they are open to hospice.  He will see our palliative care NP today for further discussion. -labs reviewed, hgb down to 7.0. CMP stable from prior. Urine protein <30.    2. Worsening anemia -Patient's iron studies show low ferritin and low iron and saturation on 09/04/21. On oral iron. -hgb dropped to 7 today (01/22/22), iron level pending. I recommend blood transfusion; we will obtain type and screen today.   3. Recurrent ascites -he previously required paracentesis in 04/2020 for abdominal distention -he developed recurrent ascites in 11/2021, requiring paracentesis during hospitalization on 12/18/21 and again on 01/02/22. Cytology was negative. -he again has fluid build up on exam today. He is scheduled for repeat paracentesis today, but he declines.    PLAN: -f/u with NP Lexine Baton today, he agreed with hospice after that visit  -lab today  -blood transfusion tomorrow  -cancel paracentesis scheduled today per pt's request  -f/u open   No problem-specific Assessment & Plan notes found for this encounter.   SUMMARY OF ONCOLOGIC HISTORY: Oncology History Overview Note  Cancer Staging No matching staging information was found for the patient.    Adenocarcinoma of esophagus metastatic to liver ([place])  11/11/2020 Procedure   EGD by Dr. Watt Climes - Normal larynx. -? Malignant-appearing esophageal stenosis. Biopsied. - Pills were found in the esophagus. Removal was successful. - Rule out malignancy,? gastric tumor in the cardia difficult to say based on bleeding from passing the scope and unable to wash and suction for adequate visualization. - Normal ampulla, duodenal bulb, first portion of the duodenum, second portion of the duodenum, major papilla and area  of the papilla. - The examination was otherwise normal.    11/11/2020 Initial Biopsy   FINAL MICROSCOPIC DIAGNOSIS:  A. ESOPHAGUS, DISTAL, BIOPSY:  -  Poorly differentiated adenocarcinoma  -  See comment    11/11/2020 Initial Diagnosis   Primary adenocarcinoma of distal third of esophagus ([place])   11/11/2020 Cancer Staging   Staging form: Esophagus - Adenocarcinoma, AJCC 8th Edition - Clinical stage from 11/11/2020: Stage IVB (cTX, cN1, cM1) - Signed by [name], MD on 12/26/2020   11/14/2020 Imaging   CT CAPIMPRESSION: 1. Aggressive appearing new tumor of the gastroesophageal junction confluent with the irregular collection of right gastric lymph nodes, and with new associated metastatic retroperitoneal adenopathy. Small paraesophageal lymph nodes in the thorax are increased in size from prior exam and could also be involved, and there is a nonspecific 1.0 cm lesion inferiorly in the right hepatic lobe which could potentially be metastatic. 2. New subacute burst (AO type A4) fracture of L5, with mild posterior bony retropulsion and with a dominant coronally oriented fracture plane, and about 50% loss of height. Correlate with interval trauma. 3. Slight thickening along the left paracolic gutter and trace pelvic ascites, although the ascites is markedly reduced compared to 05/04/2020. 4. Other imaging findings of potential clinical significance: Aortic Atherosclerosis (ICD10-I70.0). Coronary atherosclerosis. Old compression fracture at L2.   12/03/2020 PET scan   IMPRESSION: Tumor at the GE junction with local perigastric infiltration, hepatic and nodal metastases.   Nodal involvement both in the chest and abdomen extending to the lower retroperitoneum in the abdomen.   Burst fracture at L5 shows signs of increased metabolic activity, nonspecific in the setting of fracture, in light of other findings would correlate with any history of trauma, if no history of trauma consider MRI to exclude the possibility of pathologic fracture.    12/27/2020 Genetic Testing   Negative hereditary cancer genetic testing: no pathogenic variants detected in [facility] Multi-Cancer +RNA Panel.  The report date is December 27, 2020.   The Multi-Cancer + RNA Panel offered by [facility] includes sequencing and/or deletion/duplication analysis of the following 84 genes:  AIP*, ALK, APC*, ATM*, AXIN2*, BAP1*, BARD1*, BLM*, BMPR1A*, BRCA1*, BRCA2*, BRIP1*, CASR, CDC73*, CDH1*, CDK4, CDKN1B*, CDKN1C*, CDKN2A, CEBPA, CHEK2*, CTNNA1*, DICER1*, DIS3L2*, EGFR, EPCAM, FH*, FLCN*, GATA2*, GPC3, GREM1, HOXB13, HRAS, KIT, MAX*, MEN1*, MET, MITF, MLH1*, MSH2*, MSH3*, MSH6*, MUTYH*, NBN*, NF1*, NF2*, NTHL1*, PALB2*, PDGFRA, PHOX2B, PMS2*, POLD1*, POLE*, POT1*, PRKAR1A*, PTCH1*, PTEN*, RAD50*, RAD51C*, RAD51D*, RB1*, RECQL4, RET, RUNX1*, SDHA*, SDHAF2*, SDHB*, SDHC*, SDHD*, SMAD4*, SMARCA4*, SMARCB1*, SMARCE1*, STK11*, SUFU*, TERC, TERT, TMEM127*, Tp53*, TSC1*, TSC2*, VHL*, WRN*, and WT1.  RNA analysis is performed for * genes.   01/07/2021 - 05/07/2021 Chemotherapy   Patient is on Treatment Plan : GASTROESOPHAGEAL Nivolumab q14d x 8 cycles / Nivolumab q28d     03/26/2021 Imaging   CT CAP IMPRESSION: 1. Evidence of treatment response. There is decreased size of the irregular infiltrative mass centered at the gastroesophageal junction as well as decreased size of numerous metastatic retroperitoneal and upper abdominal lymph nodes. 2. 2.1 cm hepatic metastasis at the inferior right hepatic lobe segment 6 is stable to slightly increased in size since previous PET-CT. No new hepatic mass visualized. 3. A few new adjacent small pulmonary nodules in the posterior left lower lobe measuring up to 4 mm in size. Continued follow-up recommended. 4. Small right pleural effusion which is new since previous study. Small pericardial effusion increased since previous study. 5. Other ancillary findings as described.  06/30/2021 Imaging   EXAM: CT CHEST, ABDOMEN, AND PELVIS WITH  CONTRAST  IMPRESSION: 1. Worsening nodal disease in the chest and abdomen more so within the abdomen as described. 2. Increasing size of RIGHT pleural fluid and pericardial effusion. Now moderate. 3. Increasing size of RIGHT hepatic lesion. 4. New LEFT adrenal metastasis. 5. Increasing size of retroperitoneal adenopathy. 6. Obstruction of the RIGHT ureter may be related to a metastatic lesion to the RIGHT ureter, blood clot or primary urothelial lesion and is associated with mild hydronephrosis, moderate ureteral dilation and potential filling defect also in lower pole calyces of the RIGHT kidney. 7. Trace perihepatic ascites. 8. Aortic atherosclerosis.   07/17/2021 - 10/23/2021 Chemotherapy   Patient is on Treatment Plan : GASTROESOPHAGEAL Ramucirumab D1, 15  / PACLitaxel D1,8,15 q28d     10/09/2021 - 12/18/2021 Chemotherapy   Patient is on Treatment Plan : GASTROESOPHAGEAL Ramucirumab D1, 15 + Paclitaxel D1,8,15 q28d        INTERVAL HISTORY:  Randy Wood is here for a follow up of esophageal cancer. He was last seen by me on 01/01/22. He presents to the clinic accompanied by his daughter [name]. He reports he is feeling a little better after changing when he takes his thyroid medicine. His daughter tells me he developed fatigue and SOB the day after his iron infusion.   All other systems were reviewed with the patient and are negative.  MEDICAL HISTORY:  Past Medical History:  Diagnosis Date   AKI (acute kidney injury) ([place])    Alcohol abuse    Arthritis    Cataract 2006   Diarrhea    Edema leg    Esophageal cancer ([place])    Family history of breast cancer 12/09/2020   Family history of ovarian cancer 12/09/2020   Family history of prostate cancer 12/09/2020   GERD (gastroesophageal reflux disease)    Glaucoma unknown   Hyperbilirubinemia    Hyperlipemia    Hypertension    Hypokalemia    Hypothyroidism 12/14/2021   Liver failure ([facility])    Macular degeneration, wet  ([facility])    Microcytic anemia    Osteoarthritis    SOB (shortness of breath)    Weight gain     SURGICAL HISTORY: Past Surgical History:  Procedure Laterality Date   ANKLE SURGERY     BIOPSY  11/11/2020   Procedure: BIOPSY;  Surgeon: [name], MD;  [facility];  Service: Gastroenterology;;   CATARACT EXTRACTION, BILATERAL     ESOPHAGOGASTRODUODENOSCOPY (EGD) WITH PROPOFOL N/A 11/11/2020   Procedure: ESOPHAGOGASTRODUODENOSCOPY (EGD) WITH PROPOFOL;  Surgeon: [name], MD;  [facility];  Service: Gastroenterology;  Laterality: N/A;   EYE SURGERY  2006   FOREIGN BODY REMOVAL  11/11/2020   Procedure: FOREIGN BODY REMOVAL;  Surgeon: [name], MD;  [facility];  Service: Gastroenterology;;   IR IMAGING GUIDED PORT INSERTION  07/16/2021   IR PARACENTESIS  05/09/2020   TONSILLECTOMY      I have reviewed the social history and family history with the patient and they are unchanged from previous note.  ALLERGIES:  is allergic to bee venom and lisinopril.  MEDICATIONS:  Current Outpatient Medications  Medication Sig Dispense Refill   Azelastine HCl 137 MCG/SPRAY SOLN INSTILL 2 SPRAYS INTO EACH NOSTRIL TWICE A DAY AS DIRECTED (Patient taking differently: Place 2 sprays into the nose 2 (two) times daily as needed (allergies).) 30 mL 1   cefadroxil (DURICEF) 1 g tablet Take 1 tablet (1 g total)  by mouth 2 (two) times daily. 8 tablet 0   cetirizine (ZYRTEC) 10 MG tablet Take 20 mg by mouth in the morning.     dorzolamide-timolol (COSOPT) 22.3-6.8 MG/ML ophthalmic solution Place 1 drop into the left eye 2 (two) times daily.     EPINEPHrine 0.3 mg/0.3 mL IJ SOAJ injection Inject 0.3 mg into the muscle as needed for anaphylaxis.     ezetimibe (ZETIA) 10 MG tablet Take 1 tablet (10 mg total) by mouth daily. 90 tablet 3   Ferrous Sulfate (IRON SLOW RELEASE) 142 (45 Fe) MG TBCR Take 1 tablet by mouth daily. 30 tablet 3   latanoprost (XALATAN) 0.005 % ophthalmic  solution Place 1 drop into the left eye at bedtime.     levothyroxine (SYNTHROID) 100 MCG tablet Take 1 tablet (100 mcg total) by mouth daily. 30 tablet 3   lidocaine-prilocaine (EMLA) cream Apply 1 application. topically as needed. 30 g 0   loratadine (CLARITIN) 10 MG tablet Take 10 mg by mouth daily.     Multiple Vitamins-Minerals (ONE-A-DAY MENS 50+) TABS Take 1 tablet by mouth daily.     Multiple Vitamins-Minerals (PRESERVISION AREDS 2 PO) Take 1 tablet by mouth 2 (two) times daily.     spironolactone (ALDACTONE) 50 MG tablet TAKE ONE TABLET BY MOUTH DAILY 90 tablet 0   No current facility-administered medications for this visit.    PHYSICAL EXAMINATION: ECOG PERFORMANCE STATUS: 3 - Symptomatic, >50% confined to bed  Vitals:   01/22/22 1244  BP: 115/60  Pulse: 92  Resp: 18  Temp: (!) 97.5 F (36.4 C)  SpO2: 100%   Wt Readings from Last 3 Encounters:  01/22/22 185 lb 1.6 oz (84 kg)  01/21/22 187 lb (84.8 kg)  01/19/22 186 lb 3.2 oz (84.5 kg)     GENERAL:alert, no distress and comfortable SKIN: skin color, texture, turgor are normal, no rashes or significant lesions EYES: normal, Conjunctiva are pink and non-injected, sclera clear LUNGS: clear to auscultation and percussion with normal breathing effort HEART: regular rate & rhythm and no murmurs and no lower extremity edema ABDOMEN: soft, non-tender, (+) moderate fluids present Musculoskeletal:no cyanosis of digits and no clubbing  NEURO: alert & oriented x 3 with fluent speech, no focal motor/sensory deficits  LABORATORY DATA:  I have reviewed the data as listed    Latest Ref Rng & Units 01/22/2022   12:02 PM 01/01/2022   10:04 AM 12/30/2021    2:37 PM  CBC  WBC 4.0 - 10.5 K/uL 6.8  4.7  7.2   Hemoglobin 13.0 - 17.0 g/dL 7.0  7.9  8.4   Hematocrit 39.0 - 52.0 % 21.9  25.1  25.4   Platelets 150 - 400 K/uL 194  197  219         Latest Ref Rng & Units 01/22/2022   12:02 PM 01/01/2022   10:04 AM 12/18/2021    5:46  AM  CMP  Glucose 70 - 99 mg/dL 129  105  130   BUN 8 - 23 mg/dL _0 Creatinine 0.61 - 1.24 mg/dL 1.15  0.97  1.13   Sodium 135 - 145 mmol/L 137  137  135   Potassium 3.5 - 5.1 mmol/L 4.2  4.1  3.2   Chloride 98 - 111 mmol/L 112  111  112   CO2 22 - 32 mmol/L _1 Calcium 8.9 - 10.3 mg/dL 8.1  8.1  7.8  Total Protein 6.5 - 8.1 g/dL 5.1  5.4    Total Bilirubin 0.3 - 1.2 mg/dL 0.7  0.9    Alkaline Phos 38 - 126 U/L 83  83    AST 15 - 41 U/L 25  41    ALT 0 - 44 U/L 15  18        RADIOGRAPHIC STUDIES: I have personally reviewed the radiological images as listed and agreed with the findings in the report. No results found.    Orders Placed This Encounter  Procedures   Ammonia    Standing Status:   Future    Number of Occurrences:   1    Standing Expiration Date:   01/22/2023   Informed Consent Details: Physician/Practitioner Attestation; Transcribe to consent form and obtain patient signature    Order Specific Question:   Physician/Practitioner attestation of informed consent for blood and or blood product transfusion    Answer:   I, the physician/practitioner, attest that I have discussed with the patient the benefits, risks, side effects, alternatives, likelihood of achieving goals and potential problems during recovery for the procedure that I have provided informed consent.    Order Specific Question:   Product(s)    Answer:   All Product(s)   Type and screen         Standing Status:   Future    Number of Occurrences:   1    Standing Expiration Date:   01/22/2023   All questions were answered. The patient knows to call the clinic with any problems, questions or concerns. No barriers to learning was detected. The total time spent in the appointment was 40 minutes.     [name], MD 01/22/2022   I, Wilburn Mylar, am acting as scribe for [name], MD.   I have reviewed the above documentation for accuracy and completeness, and I agree with the  above.

## 2022-01-23 ENCOUNTER — Other Ambulatory Visit: Payer: Self-pay

## 2022-01-23 ENCOUNTER — Inpatient Hospital Stay: Payer: Medicare Other

## 2022-01-23 ENCOUNTER — Telehealth: Payer: Self-pay

## 2022-01-23 ENCOUNTER — Encounter: Payer: Self-pay | Admitting: Nurse Practitioner

## 2022-01-23 ENCOUNTER — Telehealth: Payer: Self-pay | Admitting: Internal Medicine

## 2022-01-23 VITALS — BP 103/58 | HR 88 | Temp 97.7°F | Resp 18

## 2022-01-23 DIAGNOSIS — E876 Hypokalemia: Secondary | ICD-10-CM | POA: Diagnosis not present

## 2022-01-23 DIAGNOSIS — C155 Malignant neoplasm of lower third of esophagus: Secondary | ICD-10-CM | POA: Diagnosis not present

## 2022-01-23 DIAGNOSIS — D649 Anemia, unspecified: Secondary | ICD-10-CM

## 2022-01-23 DIAGNOSIS — C159 Malignant neoplasm of esophagus, unspecified: Secondary | ICD-10-CM

## 2022-01-23 DIAGNOSIS — Z95828 Presence of other vascular implants and grafts: Secondary | ICD-10-CM

## 2022-01-23 DIAGNOSIS — Z79899 Other long term (current) drug therapy: Secondary | ICD-10-CM | POA: Diagnosis not present

## 2022-01-23 DIAGNOSIS — C787 Secondary malignant neoplasm of liver and intrahepatic bile duct: Secondary | ICD-10-CM | POA: Diagnosis not present

## 2022-01-23 MED ORDER — ACETAMINOPHEN 325 MG PO TABS
650.0000 mg | ORAL_TABLET | Freq: Once | ORAL | Status: AC
  Administered 2022-01-23: 650 mg via ORAL
  Filled 2022-01-23: qty 2

## 2022-01-23 MED ORDER — SODIUM CHLORIDE 0.9% IV SOLUTION
250.0000 mL | Freq: Once | INTRAVENOUS | Status: AC
  Administered 2022-01-23: 250 mL via INTRAVENOUS

## 2022-01-23 MED ORDER — SODIUM CHLORIDE 0.9% FLUSH
10.0000 mL | Freq: Once | INTRAVENOUS | Status: AC
  Administered 2022-01-23: 10 mL

## 2022-01-23 MED ORDER — LEVOTHYROXINE SODIUM 125 MCG PO TABS: 125.0000 ug | ORAL_TABLET | Freq: Every day | ORAL | 3 refills | Status: DC

## 2022-01-23 MED ORDER — DIPHENHYDRAMINE HCL 25 MG PO CAPS
25.0000 mg | ORAL_CAPSULE | Freq: Once | ORAL | Status: AC
  Administered 2022-01-23: 25 mg via ORAL
  Filled 2022-01-23: qty 1

## 2022-01-23 MED ORDER — HEPARIN SOD (PORK) LOCK FLUSH 100 UNIT/ML IV SOLN
500.0000 [IU] | Freq: Once | INTRAVENOUS | Status: AC
  Administered 2022-01-23: 500 [IU]

## 2022-01-23 NOTE — Progress Notes (Signed)
[place]  Telephone:(336) [phone] Fax:(336) [phone]   Name: Randy Wood Date: 01/23/2022 MRN: [id number]  DOB: 01-Jul-1940  Patient Care Team: [name], [name], MD as PCP - General (Family Medicine) [name], MD as Consulting Physician (Gastroenterology) [name], MD as Consulting Physician (Hematology) [name], NP as Nurse Practitioner (Nurse Practitioner) Ander Slade, Carlisle Beers, MD (Ophthalmology) [facility], [facility] as Consulting Physician (Hospice and Palliative Medicine)    REASON FOR CONSULTATION: Randy Wood is a 81 y.o. 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 Now requiring paracentesis 9/21 and 10/6 both yielding 2.2L.  Palliative ask to see for symptom management and goals of care.    SOCIAL HISTORY:     reports that he quit smoking about 49 years ago. His smoking use included cigarettes. He has a 30.00 pack-year smoking history. He has never used smokeless tobacco. He reports that he does not currently use alcohol. He reports that he does not use drugs.  ADVANCE DIRECTIVES:  Patient has advanced directives. His daughters are his medical decision makers.   CODE STATUS: DNR  PAST MEDICAL HISTORY: Past Medical History:  Diagnosis Date   AKI (acute kidney injury) ([place])    Alcohol abuse    Arthritis    Cataract 2006   Diarrhea    Edema leg    Esophageal cancer ([place])    Family history of breast cancer 12/09/2020   Family history of ovarian cancer 12/09/2020   Family history of prostate cancer 12/09/2020   GERD (gastroesophageal reflux disease)    Glaucoma unknown   Hyperbilirubinemia    Hyperlipemia    Hypertension    Hypokalemia    Hypothyroidism 12/14/2021   Liver failure (HCC)    Macular degeneration, wet (HCC)    Microcytic anemia    Osteoarthritis     SOB (shortness of breath)    Weight gain     PAST SURGICAL HISTORY:  Past Surgical History:  Procedure Laterality Date   ANKLE SURGERY     BIOPSY  11/11/2020   Procedure: BIOPSY;  Surgeon: [name], MD;  Location: [facility];  Service: Gastroenterology;;   CATARACT EXTRACTION, BILATERAL     ESOPHAGOGASTRODUODENOSCOPY (EGD) WITH PROPOFOL N/A 11/11/2020   Procedure: ESOPHAGOGASTRODUODENOSCOPY (EGD) WITH PROPOFOL;  Surgeon: [name], MD;  Location: [facility];  Service: Gastroenterology;  Laterality: N/A;   EYE SURGERY  2006   FOREIGN BODY REMOVAL  11/11/2020   Procedure: FOREIGN BODY REMOVAL;  Surgeon: [name], MD;  Location: [facility];  Service: Gastroenterology;;   IR IMAGING GUIDED PORT INSERTION  07/16/2021   IR PARACENTESIS  05/09/2020   TONSILLECTOMY      HEMATOLOGY/ONCOLOGY HISTORY:  Oncology History Overview Note  Cancer Staging No matching staging information was found for the patient.    Adenocarcinoma of esophagus metastatic to liver ([place])  11/11/2020 Procedure   EGD by Dr. Watt Climes - Normal larynx. -? Malignant-appearing esophageal stenosis. Biopsied. - Pills were found in the esophagus. Removal was successful. - Rule out malignancy,? gastric tumor in the cardia difficult to say based on bleeding from passing the scope and unable to wash and suction for adequate visualization. - Normal ampulla, duodenal bulb, first portion of the duodenum, second portion of the duodenum, major papilla and area of the papilla. - The examination was otherwise normal.   11/11/2020 Initial Biopsy   FINAL  MICROSCOPIC DIAGNOSIS:  A. ESOPHAGUS, DISTAL, BIOPSY:  -  Poorly differentiated adenocarcinoma  -  See comment    11/11/2020 Initial Diagnosis   Primary adenocarcinoma of distal third of esophagus ([place])   11/11/2020 Cancer Staging   Staging form: Esophagus - Adenocarcinoma, AJCC 8th Edition - Clinical stage from 11/11/2020: Stage IVB (cTX, cN1, cM1) - Signed by [name], MD on 12/26/2020   11/14/2020 Imaging   CT CAPIMPRESSION: 1. Aggressive appearing new tumor of the gastroesophageal junction confluent with the irregular collection of right gastric lymph nodes, and with new associated metastatic retroperitoneal adenopathy. Small paraesophageal lymph nodes in the thorax are increased in size from prior exam and could also be involved, and there is a nonspecific 1.0 cm lesion inferiorly in the right hepatic lobe which could potentially be metastatic. 2. New subacute burst (AO type A4) fracture of L5, with mild posterior bony retropulsion and with a dominant coronally oriented fracture plane, and about 50% loss of height. Correlate with interval trauma. 3. Slight thickening along the left paracolic gutter and trace pelvic ascites, although the ascites is markedly reduced compared to 05/04/2020. 4. Other imaging findings of potential clinical significance: Aortic Atherosclerosis (ICD10-I70.0). Coronary atherosclerosis. Old compression fracture at L2.   12/03/2020 PET scan   IMPRESSION: Tumor at the GE junction with local perigastric infiltration, hepatic and nodal metastases.   Nodal involvement both in the chest and abdomen extending to the lower retroperitoneum in the abdomen.   Burst fracture at L5 shows signs of increased metabolic activity, nonspecific in the setting of fracture, in light of other findings would correlate with any history of trauma, if no history of trauma consider MRI to exclude the possibility of pathologic fracture.   12/27/2020 Genetic Testing   Negative hereditary cancer genetic testing: no pathogenic variants detected in [facility] Multi-Cancer +RNA Panel.  The report date is December 27, 2020.   The Multi-Cancer + RNA Panel offered by [facility] includes sequencing and/or deletion/duplication analysis of the following 84 genes:  AIP*, ALK, APC*, ATM*, AXIN2*, BAP1*, BARD1*, BLM*, BMPR1A*, BRCA1*, BRCA2*, BRIP1*, CASR,  CDC73*, CDH1*, CDK4, CDKN1B*, CDKN1C*, CDKN2A, CEBPA, CHEK2*, CTNNA1*, DICER1*, DIS3L2*, EGFR, EPCAM, FH*, FLCN*, GATA2*, GPC3, GREM1, HOXB13, HRAS, KIT, MAX*, MEN1*, MET, MITF, MLH1*, MSH2*, MSH3*, MSH6*, MUTYH*, NBN*, NF1*, NF2*, NTHL1*, PALB2*, PDGFRA, PHOX2B, PMS2*, POLD1*, POLE*, POT1*, PRKAR1A*, PTCH1*, PTEN*, RAD50*, RAD51C*, RAD51D*, RB1*, RECQL4, RET, RUNX1*, SDHA*, SDHAF2*, SDHB*, SDHC*, SDHD*, SMAD4*, SMARCA4*, SMARCB1*, SMARCE1*, STK11*, SUFU*, TERC, TERT, TMEM127*, Tp53*, TSC1*, TSC2*, VHL*, WRN*, and WT1.  RNA analysis is performed for * genes.   01/07/2021 - 05/07/2021 Chemotherapy   Patient is on Treatment Plan : GASTROESOPHAGEAL Nivolumab q14d x 8 cycles / Nivolumab q28d     03/26/2021 Imaging   CT CAP IMPRESSION: 1. Evidence of treatment response. There is decreased size of the irregular infiltrative mass centered at the gastroesophageal junction as well as decreased size of numerous metastatic retroperitoneal and upper abdominal lymph nodes. 2. 2.1 cm hepatic metastasis at the inferior right hepatic lobe segment 6 is stable to slightly increased in size since previous PET-CT. No new hepatic mass visualized. 3. A few new adjacent small pulmonary nodules in the posterior left lower lobe measuring up to 4 mm in size. Continued follow-up recommended. 4. Small right pleural effusion which is new since previous study. Small pericardial effusion increased since previous study. 5. Other ancillary findings as described.     06/30/2021 Imaging   EXAM: CT CHEST, ABDOMEN, AND PELVIS WITH CONTRAST  IMPRESSION:  1. Worsening nodal disease in the chest and abdomen more so within the abdomen as described. 2. Increasing size of RIGHT pleural fluid and pericardial effusion. Now moderate. 3. Increasing size of RIGHT hepatic lesion. 4. New LEFT adrenal metastasis. 5. Increasing size of retroperitoneal adenopathy. 6. Obstruction of the RIGHT ureter may be related to a metastatic lesion to the  RIGHT ureter, blood clot or primary urothelial lesion and is associated with mild hydronephrosis, moderate ureteral dilation and potential filling defect also in lower pole calyces of the RIGHT kidney. 7. Trace perihepatic ascites. 8. Aortic atherosclerosis.   07/17/2021 - 10/23/2021 Chemotherapy   Patient is on Treatment Plan : GASTROESOPHAGEAL Ramucirumab D1, 15  / PACLitaxel D1,8,15 q28d     10/09/2021 - 12/18/2021 Chemotherapy   Patient is on Treatment Plan : GASTROESOPHAGEAL Ramucirumab D1, 15 + Paclitaxel D1,8,15 q28d       ALLERGIES:  is allergic to bee venom and lisinopril.  MEDICATIONS:  Current Outpatient Medications  Medication Sig Dispense Refill   Azelastine HCl 137 MCG/SPRAY SOLN INSTILL 2 SPRAYS INTO EACH NOSTRIL TWICE A DAY AS DIRECTED (Patient taking differently: Place 2 sprays into the nose 2 (two) times daily as needed (allergies).) 30 mL 1   cefadroxil (DURICEF) 1 g tablet Take 1 tablet (1 g total) by mouth 2 (two) times daily. 8 tablet 0   cetirizine (ZYRTEC) 10 MG tablet Take 20 mg by mouth in the morning.     dorzolamide-timolol (COSOPT) 22.3-6.8 MG/ML ophthalmic solution Place 1 drop into the left eye 2 (two) times daily.     EPINEPHrine 0.3 mg/0.3 mL IJ SOAJ injection Inject 0.3 mg into the muscle as needed for anaphylaxis.     ezetimibe (ZETIA) 10 MG tablet Take 1 tablet (10 mg total) by mouth daily. 90 tablet 3   Ferrous Sulfate (IRON SLOW RELEASE) 142 (45 Fe) MG TBCR Take 1 tablet by mouth daily. 30 tablet 3   latanoprost (XALATAN) 0.005 % ophthalmic solution Place 1 drop into the left eye at bedtime.     levothyroxine (SYNTHROID) 125 MCG tablet Take 1 tablet (125 mcg total) by mouth daily. 90 tablet 3   lidocaine-prilocaine (EMLA) cream Apply 1 application. topically as needed. 30 g 0   loratadine (CLARITIN) 10 MG tablet Take 10 mg by mouth daily.     Multiple Vitamins-Minerals (ONE-A-DAY MENS 50+) TABS Take 1 tablet by mouth daily.     Multiple  Vitamins-Minerals (PRESERVISION AREDS 2 PO) Take 1 tablet by mouth 2 (two) times daily.     spironolactone (ALDACTONE) 50 MG tablet TAKE ONE TABLET BY MOUTH DAILY 90 tablet 0   No current facility-administered medications for this visit.    VITAL SIGNS: There were no vitals taken for this visit. There were no vitals filed for this visit.  Estimated body mass index is 25.82 kg/m as calculated from the following:   Height as of an earlier encounter on 01/22/22: _0  (1.803 m).   Weight as of an earlier encounter on 01/22/22: 185 lb 1.6 oz (84 kg).  LABS: CBC:    Component Value Date/Time   WBC 6.8 01/22/2022 1202   WBC 4.7 01/01/2022 1004   HGB 7.0 (L) 01/22/2022 1202   HGB 8.4 (L) 12/30/2021 1437   HCT 21.9 (L) 01/22/2022 1202   HCT 25.4 (L) 12/30/2021 1437   PLT 194 01/22/2022 1202   PLT 219 12/30/2021 1437   MCV 100.0 01/22/2022 1202   MCV 96 12/30/2021 1437   NEUTROABS 4.6 01/22/2022  1202   NEUTROABS 4.4 12/30/2021 1437   LYMPHSABS 1.3 01/22/2022 1202   LYMPHSABS 1.9 12/30/2021 1437   MONOABS 0.7 01/22/2022 1202   EOSABS 0.2 01/22/2022 1202   EOSABS 0.1 12/30/2021 1437   BASOSABS 0.0 01/22/2022 1202   BASOSABS 0.1 12/30/2021 1437   Comprehensive Metabolic Panel:    Component Value Date/Time   NA 137 01/22/2022 1202   K 4.2 01/22/2022 1202   CL 112 (H) 01/22/2022 1202   CO2 18 (L) 01/22/2022 1202   BUN 26 (H) 01/22/2022 1202   CREATININE 1.15 01/22/2022 1202   CREATININE 0.99 09/17/2020 1050   GLUCOSE 129 (H) 01/22/2022 1202   CALCIUM 8.1 (L) 01/22/2022 1202   AST 25 01/22/2022 1202   ALT 15 01/22/2022 1202   ALKPHOS 83 01/22/2022 1202   BILITOT 0.7 01/22/2022 1202   PROT 5.1 (L) 01/22/2022 1202   ALBUMIN 2.9 (L) 01/22/2022 1202    RADIOGRAPHIC STUDIES: DG Chest 2 View  Result Date: 01/19/2022 CLINICAL DATA:  81 year old male with shortness of breath EXAM: CHEST - 2 VIEW COMPARISON:  05/03/2020, 12/14/2021 FINDINGS: Cardiomediastinal silhouette  unchanged in size and contour. No evidence of central vascular congestion. No interlobular septal thickening. Unchanged right IJ port catheter. Wanting of the bilateral costophrenic angles and blunting of the sulcus on the lateral view. No confluent airspace disease. No acute displaced fracture. Degenerative changes of the spine. IMPRESSION: Small bilateral pleural effusions without overt edema or lobar pneumonia. Electronically Signed   By: Corrie Mckusick D.O.   On: 01/19/2022 15:54   US Paracentesis  Result Date: 01/02/2022 INDICATION: Patient with history of metastatic esophageal cancer, recurrent ascites. Request received for diagnostic and therapeutic paracentesis. EXAM: ULTRASOUND GUIDED DIAGNOSTIC AND THERAPEUTIC PARACENTESIS MEDICATIONS: 8 mL 1% lidocaine COMPLICATIONS: None immediate. PROCEDURE: Informed written consent was obtained from the patient after a discussion of the risks, benefits and alternatives to treatment. A timeout was performed prior to the initiation of the procedure. Initial ultrasound scanning demonstrates a small to moderate amount of ascites within the right mid to lower abdominal quadrant. The right mid to lower abdomen was prepped and draped in the usual sterile fashion. 1% lidocaine was used for local anesthesia. Following this, a 19 gauge, 7-cm, Yueh catheter was introduced. An ultrasound image was saved for documentation purposes. The paracentesis was performed. The catheter was removed and a dressing was applied. The patient tolerated the procedure well without immediate post procedural complication. FINDINGS: A total of approximately 2.2 liters of hazy, yellow fluid was removed. Samples were sent to the laboratory as requested by the clinical team. IMPRESSION: Successful ultrasound-guided diagnostic and therapeutic paracentesis yielding 2.2 liters of peritoneal fluid. Read by: Rowe Robert, PA-C Electronically Signed   By: Lucrezia Europe M.D.   On: 01/02/2022 17:07     PERFORMANCE STATUS (ECOG) : 3 - Symptomatic, >50% confined to bed  Review of Systems  Constitutional:  Positive for appetite change.  Gastrointestinal:  Positive for abdominal distention.  Neurological:  Positive for weakness.  Unless otherwise noted, a complete review of systems is negative.  Physical Exam General: NAD Cardiovascular: regular rate and rhythm Pulmonary: clear ant fields Abdomen: firm, distended, nontender, + bowel sounds Extremities: no edema, no joint deformities Skin: no rashes Neurological: AAOx3, mood appropriate, hard of hearing   IMPRESSION: This is my initial visit with Randy Wood. No acute distress noted. Ambulatory with cane however with unsteady gait. His daughter, [name] is present with him today. He is alert and able to engage  appropriately in discussions. At patient's request he did not wish for his daughter to be a part of initial discussions. Wood was able to provide updates afterwards in presence of patient and his daughter, Holley Dexter was on speaker phone.   Wood introduced myself, [place] RN, and Palliative's role in collaboration with the oncology team. Concept of Palliative Care was introduced as specialized medical care for people and their families living with serious illness.  It focuses on providing relief from the symptoms and stress of a serious illness.  The goal is to improve quality of life for both the patient and the family. Values and goals of care important to patient and family were attempted to be elicited.   Randy Wood initially lived in the home alone until his recent health challenges. He now has support from his daughters 24/7.  His wife passed away several years ago due to glioblastoma.  He has 2 daughters.  A retired Engineer, maintenance (IT).  Christian faith and heavily involved in his church including leading Bible study in the past.  He shares he has been unable to attend regularly due to his increased weakness and gait instability.  At home patient  requires some assistance with ADLs.  He has a walker to assist with mobility however often times refuses to use.  Appetite fluctuates.  He reports feelings of fullness even after several bites.  Denies constipation, diarrhea, nausea, vomiting.  His abdominal area is somewhat firm and tight.  He has had previous paracentesis however is declining to have done today stating previous procedure was painful.  We discussed at length symptom management and if he develops symptoms such as increased pain or shortness of breath this procedure may be most beneficial to help support his symptoms.  He verbalized understanding and will consider at that time.  Randy Wood had a lengthy goals of care discussions.  Wood had an open and honest conversation with him regarding his cancer and prognosis which was also discussed by Dr. Burr Medico.  We discussed his current illness and what it means in the larger context of his on-going co-morbidities. Natural disease trajectory and expectations were discussed. He verbalizes his understanding and is realistic in his future expectations.  Randy Wood shares his previous experience with hospice and the care they provided for his wife during her last days.  We discussed how they will be able to provide the care that he needs as his request is to be at home and then what time he has left amongst his family.  He is somewhat emotional sharing loss of independence and recognizing he is gradually becoming weaker.  Emotional support provided.  We discussed ways to continue to have some form of independence however with the importance of understanding that his family is present and guarded for his safety.  He does acknowledge he is unable to do the things he wants did.  Education provided on outpatient hospice and support in his home.  We discussed what care would look like and how this would be most beneficial for him including aggressive symptom management as his health declines.  He shares he would  wish to not have to leave his home and go to multiple appointments.  We discussed with hospice care he would not have to continue to go to appointments as they will be evaluating him in the home for any needs and manage from there with his comfort is the main focus.  Randy Wood verbalized understanding and is requesting for hospice to be arranged  in his home.  Wood provided extensive updates to his daughters with patient's permission.  [name] was able to reach Randy Wood by phone.  They both verbalized understanding and appreciation.  They are in full agreement with current plan and would like for hospice to be arranged sooner than later.  They do not have any immediate equipment/home DME request but understand this can be further discussed during the admission process.  Patient is a confirmed DNR/DNI.  Wood discussed the importance of continued conversation with family and their medical providers regarding overall plan of care and treatment options, ensuring decisions are within the context of the patients values and GOCs.  PLAN: Established therapeutic relationship. Education provided on palliative's role in collaboration with their Oncology/Radiation team. No symptom management needs at this time DNR/DNI Extensive goals of care discussions.  Patient and family all mutually agreed for outpatient hospice support in the home. Olivia Mackie, RN Teacher, early years/pre) aware of home request.  They will plan to reach out to family and arrange home visit. Paracentesis as needed for symptom management. Wood will plan to see patient back in 2-4 weeks in collaboration to other oncology appointments.    Patient expressed understanding and was in agreement with this plan. He also understands that He can call the clinic at any time with any questions, concerns, or complaints.   Thank you for your referral and allowing Palliative to assist in Randy Wood care.   Number and complexity of problems addressed: HIGH - 1 or more chronic  illnesses with SEVERE exacerbation, progression, or side effects of treatment - advanced cancer, pain. Any controlled substances utilized were prescribed in the context of palliative care.  Time Total: 55 min   Visit consisted of counseling and education dealing with the complex and emotionally intense issues of symptom management and palliative care in the setting of serious and potentially life-threatening illness.Greater than 50%  of this time was spent counseling and coordinating care related to the above assessment and plan.  Signed by: Alda Lea, AGPCNP-BC Palliative Medicine Team/[place]

## 2022-01-23 NOTE — Telephone Encounter (Signed)
Pt daughter called, reported that hospice will be coming out on Monday, daughter wanted to see if they could come sooner. Daughter has hospice number, RN will also reach out to [facility] for scheduling needs. No further questions at this time.

## 2022-01-23 NOTE — Telephone Encounter (Signed)
Spoke with patient daughter and advised of results. She verbalized understanding.

## 2022-01-23 NOTE — Telephone Encounter (Signed)
Please let the patient know that I was able to review his thyroid test that was done yesterday, his numbers continue to be off and he needs more levothyroxine   Please asked the patient to increase levothyroxine from 100 mcg to 125 mcg  A new prescription has been sent to the pharmacy   He will need to have his thyroid checked 2 months from now, he can have this done at the oncology office    Thanks

## 2022-01-24 LAB — BPAM RBC
Blood Product Expiration Date: 202311172359
ISSUE DATE / TIME: 202310271330
Unit Type and Rh: 6200

## 2022-01-24 LAB — TYPE AND SCREEN
ABO/RH(D): A POS
Antibody Screen: NEGATIVE
Unit division: 0

## 2022-01-26 DIAGNOSIS — I10 Essential (primary) hypertension: Secondary | ICD-10-CM | POA: Diagnosis not present

## 2022-01-26 DIAGNOSIS — M1990 Unspecified osteoarthritis, unspecified site: Secondary | ICD-10-CM | POA: Diagnosis not present

## 2022-01-26 DIAGNOSIS — Z803 Family history of malignant neoplasm of breast: Secondary | ICD-10-CM | POA: Diagnosis not present

## 2022-01-26 DIAGNOSIS — C155 Malignant neoplasm of lower third of esophagus: Secondary | ICD-10-CM | POA: Diagnosis not present

## 2022-01-26 DIAGNOSIS — H3530 Unspecified macular degeneration: Secondary | ICD-10-CM | POA: Diagnosis not present

## 2022-01-26 DIAGNOSIS — F1011 Alcohol abuse, in remission: Secondary | ICD-10-CM | POA: Diagnosis not present

## 2022-01-26 DIAGNOSIS — C787 Secondary malignant neoplasm of liver and intrahepatic bile duct: Secondary | ICD-10-CM | POA: Diagnosis not present

## 2022-01-26 DIAGNOSIS — E785 Hyperlipidemia, unspecified: Secondary | ICD-10-CM | POA: Diagnosis not present

## 2022-01-26 DIAGNOSIS — Z8041 Family history of malignant neoplasm of ovary: Secondary | ICD-10-CM | POA: Diagnosis not present

## 2022-01-26 DIAGNOSIS — H269 Unspecified cataract: Secondary | ICD-10-CM | POA: Diagnosis not present

## 2022-01-26 DIAGNOSIS — K7031 Alcoholic cirrhosis of liver with ascites: Secondary | ICD-10-CM | POA: Diagnosis not present

## 2022-01-26 DIAGNOSIS — K219 Gastro-esophageal reflux disease without esophagitis: Secondary | ICD-10-CM | POA: Diagnosis not present

## 2022-01-26 DIAGNOSIS — D630 Anemia in neoplastic disease: Secondary | ICD-10-CM | POA: Diagnosis not present

## 2022-01-26 DIAGNOSIS — Z8042 Family history of malignant neoplasm of prostate: Secondary | ICD-10-CM | POA: Diagnosis not present

## 2022-01-26 DIAGNOSIS — Z87891 Personal history of nicotine dependence: Secondary | ICD-10-CM | POA: Diagnosis not present

## 2022-01-28 DIAGNOSIS — I10 Essential (primary) hypertension: Secondary | ICD-10-CM | POA: Diagnosis not present

## 2022-01-28 DIAGNOSIS — D630 Anemia in neoplastic disease: Secondary | ICD-10-CM | POA: Diagnosis not present

## 2022-01-28 DIAGNOSIS — F1011 Alcohol abuse, in remission: Secondary | ICD-10-CM | POA: Diagnosis not present

## 2022-01-28 DIAGNOSIS — K219 Gastro-esophageal reflux disease without esophagitis: Secondary | ICD-10-CM | POA: Diagnosis not present

## 2022-01-28 DIAGNOSIS — K7031 Alcoholic cirrhosis of liver with ascites: Secondary | ICD-10-CM | POA: Diagnosis not present

## 2022-01-28 DIAGNOSIS — H269 Unspecified cataract: Secondary | ICD-10-CM | POA: Diagnosis not present

## 2022-01-28 DIAGNOSIS — C787 Secondary malignant neoplasm of liver and intrahepatic bile duct: Secondary | ICD-10-CM | POA: Diagnosis not present

## 2022-01-28 DIAGNOSIS — C155 Malignant neoplasm of lower third of esophagus: Secondary | ICD-10-CM | POA: Diagnosis not present

## 2022-01-28 DIAGNOSIS — Z803 Family history of malignant neoplasm of breast: Secondary | ICD-10-CM | POA: Diagnosis not present

## 2022-01-28 DIAGNOSIS — E785 Hyperlipidemia, unspecified: Secondary | ICD-10-CM | POA: Diagnosis not present

## 2022-01-28 DIAGNOSIS — H3530 Unspecified macular degeneration: Secondary | ICD-10-CM | POA: Diagnosis not present

## 2022-01-28 DIAGNOSIS — Z87891 Personal history of nicotine dependence: Secondary | ICD-10-CM | POA: Diagnosis not present

## 2022-01-28 DIAGNOSIS — Z8042 Family history of malignant neoplasm of prostate: Secondary | ICD-10-CM | POA: Diagnosis not present

## 2022-01-28 DIAGNOSIS — Z8041 Family history of malignant neoplasm of ovary: Secondary | ICD-10-CM | POA: Diagnosis not present

## 2022-01-28 DIAGNOSIS — M1990 Unspecified osteoarthritis, unspecified site: Secondary | ICD-10-CM | POA: Diagnosis not present

## 2022-01-29 ENCOUNTER — Ambulatory Visit: Payer: Medicare Other

## 2022-01-29 ENCOUNTER — Ambulatory Visit: Payer: Medicare Other | Admitting: Hematology

## 2022-01-29 ENCOUNTER — Other Ambulatory Visit: Payer: Medicare Other

## 2022-02-02 ENCOUNTER — Encounter (INDEPENDENT_AMBULATORY_CARE_PROVIDER_SITE_OTHER): Payer: Medicare Other | Admitting: Ophthalmology

## 2022-02-02 DIAGNOSIS — C787 Secondary malignant neoplasm of liver and intrahepatic bile duct: Secondary | ICD-10-CM | POA: Diagnosis not present

## 2022-02-02 DIAGNOSIS — I10 Essential (primary) hypertension: Secondary | ICD-10-CM | POA: Diagnosis not present

## 2022-02-02 DIAGNOSIS — H43813 Vitreous degeneration, bilateral: Secondary | ICD-10-CM

## 2022-02-02 DIAGNOSIS — H35033 Hypertensive retinopathy, bilateral: Secondary | ICD-10-CM

## 2022-02-02 DIAGNOSIS — K7031 Alcoholic cirrhosis of liver with ascites: Secondary | ICD-10-CM | POA: Diagnosis not present

## 2022-02-02 DIAGNOSIS — C155 Malignant neoplasm of lower third of esophagus: Secondary | ICD-10-CM | POA: Diagnosis not present

## 2022-02-02 DIAGNOSIS — H353122 Nonexudative age-related macular degeneration, left eye, intermediate dry stage: Secondary | ICD-10-CM | POA: Diagnosis not present

## 2022-02-02 DIAGNOSIS — H353211 Exudative age-related macular degeneration, right eye, with active choroidal neovascularization: Secondary | ICD-10-CM | POA: Diagnosis not present

## 2022-02-02 DIAGNOSIS — F1011 Alcohol abuse, in remission: Secondary | ICD-10-CM | POA: Diagnosis not present

## 2022-02-02 DIAGNOSIS — K219 Gastro-esophageal reflux disease without esophagitis: Secondary | ICD-10-CM | POA: Diagnosis not present

## 2022-02-09 DIAGNOSIS — K219 Gastro-esophageal reflux disease without esophagitis: Secondary | ICD-10-CM | POA: Diagnosis not present

## 2022-02-09 DIAGNOSIS — C155 Malignant neoplasm of lower third of esophagus: Secondary | ICD-10-CM | POA: Diagnosis not present

## 2022-02-09 DIAGNOSIS — C787 Secondary malignant neoplasm of liver and intrahepatic bile duct: Secondary | ICD-10-CM | POA: Diagnosis not present

## 2022-02-09 DIAGNOSIS — K7031 Alcoholic cirrhosis of liver with ascites: Secondary | ICD-10-CM | POA: Diagnosis not present

## 2022-02-09 DIAGNOSIS — I10 Essential (primary) hypertension: Secondary | ICD-10-CM | POA: Diagnosis not present

## 2022-02-09 DIAGNOSIS — F1011 Alcohol abuse, in remission: Secondary | ICD-10-CM | POA: Diagnosis not present

## 2022-02-13 DIAGNOSIS — C155 Malignant neoplasm of lower third of esophagus: Secondary | ICD-10-CM | POA: Diagnosis not present

## 2022-02-13 DIAGNOSIS — C787 Secondary malignant neoplasm of liver and intrahepatic bile duct: Secondary | ICD-10-CM | POA: Diagnosis not present

## 2022-02-13 DIAGNOSIS — F1011 Alcohol abuse, in remission: Secondary | ICD-10-CM | POA: Diagnosis not present

## 2022-02-13 DIAGNOSIS — K7031 Alcoholic cirrhosis of liver with ascites: Secondary | ICD-10-CM | POA: Diagnosis not present

## 2022-02-13 DIAGNOSIS — I10 Essential (primary) hypertension: Secondary | ICD-10-CM | POA: Diagnosis not present

## 2022-02-13 DIAGNOSIS — K219 Gastro-esophageal reflux disease without esophagitis: Secondary | ICD-10-CM | POA: Diagnosis not present

## 2022-02-13 NOTE — Assessment & Plan Note (Signed)
Was found to have notably elevated TSH with low free T4 and T3.  Dose of levothyroxine was adjusted in the hospital.  He was advised to follow-up with his endocrinologist for monitoring with repeat labs about 4 to 6 weeks after hospitalization.

## 2022-02-13 NOTE — Assessment & Plan Note (Signed)
Noted during recent hospitalization, did receive blood transfusion.  We will recheck CBC today for monitoring

## 2022-02-13 NOTE — Assessment & Plan Note (Signed)
Noted during recent hospitalization as above.  Recommend continued close monitoring.  Patient doing well today

## 2022-02-16 DIAGNOSIS — I10 Essential (primary) hypertension: Secondary | ICD-10-CM | POA: Diagnosis not present

## 2022-02-16 DIAGNOSIS — K7031 Alcoholic cirrhosis of liver with ascites: Secondary | ICD-10-CM | POA: Diagnosis not present

## 2022-02-16 DIAGNOSIS — C155 Malignant neoplasm of lower third of esophagus: Secondary | ICD-10-CM | POA: Diagnosis not present

## 2022-02-16 DIAGNOSIS — K219 Gastro-esophageal reflux disease without esophagitis: Secondary | ICD-10-CM | POA: Diagnosis not present

## 2022-02-16 DIAGNOSIS — C787 Secondary malignant neoplasm of liver and intrahepatic bile duct: Secondary | ICD-10-CM | POA: Diagnosis not present

## 2022-02-16 DIAGNOSIS — F1011 Alcohol abuse, in remission: Secondary | ICD-10-CM | POA: Diagnosis not present

## 2022-02-23 DIAGNOSIS — F1011 Alcohol abuse, in remission: Secondary | ICD-10-CM | POA: Diagnosis not present

## 2022-02-23 DIAGNOSIS — I10 Essential (primary) hypertension: Secondary | ICD-10-CM | POA: Diagnosis not present

## 2022-02-23 DIAGNOSIS — C787 Secondary malignant neoplasm of liver and intrahepatic bile duct: Secondary | ICD-10-CM | POA: Diagnosis not present

## 2022-02-23 DIAGNOSIS — C155 Malignant neoplasm of lower third of esophagus: Secondary | ICD-10-CM | POA: Diagnosis not present

## 2022-02-23 DIAGNOSIS — K7031 Alcoholic cirrhosis of liver with ascites: Secondary | ICD-10-CM | POA: Diagnosis not present

## 2022-02-23 DIAGNOSIS — K219 Gastro-esophageal reflux disease without esophagitis: Secondary | ICD-10-CM | POA: Diagnosis not present

## 2022-02-27 DIAGNOSIS — C787 Secondary malignant neoplasm of liver and intrahepatic bile duct: Secondary | ICD-10-CM | POA: Diagnosis not present

## 2022-02-27 DIAGNOSIS — K7031 Alcoholic cirrhosis of liver with ascites: Secondary | ICD-10-CM | POA: Diagnosis not present

## 2022-02-27 DIAGNOSIS — Z8041 Family history of malignant neoplasm of ovary: Secondary | ICD-10-CM | POA: Diagnosis not present

## 2022-02-27 DIAGNOSIS — D630 Anemia in neoplastic disease: Secondary | ICD-10-CM | POA: Diagnosis not present

## 2022-02-27 DIAGNOSIS — Z87891 Personal history of nicotine dependence: Secondary | ICD-10-CM | POA: Diagnosis not present

## 2022-02-27 DIAGNOSIS — E785 Hyperlipidemia, unspecified: Secondary | ICD-10-CM | POA: Diagnosis not present

## 2022-02-27 DIAGNOSIS — F1011 Alcohol abuse, in remission: Secondary | ICD-10-CM | POA: Diagnosis not present

## 2022-02-27 DIAGNOSIS — I10 Essential (primary) hypertension: Secondary | ICD-10-CM | POA: Diagnosis not present

## 2022-02-27 DIAGNOSIS — Z8042 Family history of malignant neoplasm of prostate: Secondary | ICD-10-CM | POA: Diagnosis not present

## 2022-02-27 DIAGNOSIS — Z803 Family history of malignant neoplasm of breast: Secondary | ICD-10-CM | POA: Diagnosis not present

## 2022-02-27 DIAGNOSIS — H269 Unspecified cataract: Secondary | ICD-10-CM | POA: Diagnosis not present

## 2022-02-27 DIAGNOSIS — H3530 Unspecified macular degeneration: Secondary | ICD-10-CM | POA: Diagnosis not present

## 2022-02-27 DIAGNOSIS — C155 Malignant neoplasm of lower third of esophagus: Secondary | ICD-10-CM | POA: Diagnosis not present

## 2022-02-27 DIAGNOSIS — K219 Gastro-esophageal reflux disease without esophagitis: Secondary | ICD-10-CM | POA: Diagnosis not present

## 2022-02-27 DIAGNOSIS — M1990 Unspecified osteoarthritis, unspecified site: Secondary | ICD-10-CM | POA: Diagnosis not present

## 2022-02-27 DIAGNOSIS — E039 Hypothyroidism, unspecified: Secondary | ICD-10-CM | POA: Diagnosis not present

## 2022-03-02 DIAGNOSIS — C155 Malignant neoplasm of lower third of esophagus: Secondary | ICD-10-CM | POA: Diagnosis not present

## 2022-03-02 DIAGNOSIS — C787 Secondary malignant neoplasm of liver and intrahepatic bile duct: Secondary | ICD-10-CM | POA: Diagnosis not present

## 2022-03-02 DIAGNOSIS — F1011 Alcohol abuse, in remission: Secondary | ICD-10-CM | POA: Diagnosis not present

## 2022-03-02 DIAGNOSIS — I10 Essential (primary) hypertension: Secondary | ICD-10-CM | POA: Diagnosis not present

## 2022-03-02 DIAGNOSIS — K7031 Alcoholic cirrhosis of liver with ascites: Secondary | ICD-10-CM | POA: Diagnosis not present

## 2022-03-02 DIAGNOSIS — K219 Gastro-esophageal reflux disease without esophagitis: Secondary | ICD-10-CM | POA: Diagnosis not present

## 2022-03-04 DIAGNOSIS — C787 Secondary malignant neoplasm of liver and intrahepatic bile duct: Secondary | ICD-10-CM | POA: Diagnosis not present

## 2022-03-04 DIAGNOSIS — K7031 Alcoholic cirrhosis of liver with ascites: Secondary | ICD-10-CM | POA: Diagnosis not present

## 2022-03-04 DIAGNOSIS — F1011 Alcohol abuse, in remission: Secondary | ICD-10-CM | POA: Diagnosis not present

## 2022-03-04 DIAGNOSIS — C155 Malignant neoplasm of lower third of esophagus: Secondary | ICD-10-CM | POA: Diagnosis not present

## 2022-03-04 DIAGNOSIS — K219 Gastro-esophageal reflux disease without esophagitis: Secondary | ICD-10-CM | POA: Diagnosis not present

## 2022-03-04 DIAGNOSIS — I10 Essential (primary) hypertension: Secondary | ICD-10-CM | POA: Diagnosis not present

## 2022-03-06 ENCOUNTER — Ambulatory Visit: Payer: Medicare Other | Admitting: Cardiology

## 2022-03-06 DIAGNOSIS — I10 Essential (primary) hypertension: Secondary | ICD-10-CM | POA: Diagnosis not present

## 2022-03-06 DIAGNOSIS — C787 Secondary malignant neoplasm of liver and intrahepatic bile duct: Secondary | ICD-10-CM | POA: Diagnosis not present

## 2022-03-06 DIAGNOSIS — F1011 Alcohol abuse, in remission: Secondary | ICD-10-CM | POA: Diagnosis not present

## 2022-03-06 DIAGNOSIS — K219 Gastro-esophageal reflux disease without esophagitis: Secondary | ICD-10-CM | POA: Diagnosis not present

## 2022-03-06 DIAGNOSIS — C155 Malignant neoplasm of lower third of esophagus: Secondary | ICD-10-CM | POA: Diagnosis not present

## 2022-03-06 DIAGNOSIS — K7031 Alcoholic cirrhosis of liver with ascites: Secondary | ICD-10-CM | POA: Diagnosis not present

## 2022-03-09 ENCOUNTER — Encounter (INDEPENDENT_AMBULATORY_CARE_PROVIDER_SITE_OTHER): Payer: Medicare Other | Admitting: Ophthalmology

## 2022-03-09 DIAGNOSIS — H353211 Exudative age-related macular degeneration, right eye, with active choroidal neovascularization: Secondary | ICD-10-CM | POA: Diagnosis not present

## 2022-03-09 DIAGNOSIS — H35033 Hypertensive retinopathy, bilateral: Secondary | ICD-10-CM

## 2022-03-09 DIAGNOSIS — C155 Malignant neoplasm of lower third of esophagus: Secondary | ICD-10-CM | POA: Diagnosis not present

## 2022-03-09 DIAGNOSIS — F1011 Alcohol abuse, in remission: Secondary | ICD-10-CM | POA: Diagnosis not present

## 2022-03-09 DIAGNOSIS — I10 Essential (primary) hypertension: Secondary | ICD-10-CM | POA: Diagnosis not present

## 2022-03-09 DIAGNOSIS — K7031 Alcoholic cirrhosis of liver with ascites: Secondary | ICD-10-CM | POA: Diagnosis not present

## 2022-03-09 DIAGNOSIS — H43813 Vitreous degeneration, bilateral: Secondary | ICD-10-CM | POA: Diagnosis not present

## 2022-03-09 DIAGNOSIS — K219 Gastro-esophageal reflux disease without esophagitis: Secondary | ICD-10-CM | POA: Diagnosis not present

## 2022-03-09 DIAGNOSIS — C787 Secondary malignant neoplasm of liver and intrahepatic bile duct: Secondary | ICD-10-CM | POA: Diagnosis not present

## 2022-03-09 DIAGNOSIS — H353122 Nonexudative age-related macular degeneration, left eye, intermediate dry stage: Secondary | ICD-10-CM

## 2022-03-11 DIAGNOSIS — F1011 Alcohol abuse, in remission: Secondary | ICD-10-CM | POA: Diagnosis not present

## 2022-03-11 DIAGNOSIS — C787 Secondary malignant neoplasm of liver and intrahepatic bile duct: Secondary | ICD-10-CM | POA: Diagnosis not present

## 2022-03-11 DIAGNOSIS — I10 Essential (primary) hypertension: Secondary | ICD-10-CM | POA: Diagnosis not present

## 2022-03-11 DIAGNOSIS — K7031 Alcoholic cirrhosis of liver with ascites: Secondary | ICD-10-CM | POA: Diagnosis not present

## 2022-03-11 DIAGNOSIS — K219 Gastro-esophageal reflux disease without esophagitis: Secondary | ICD-10-CM | POA: Diagnosis not present

## 2022-03-11 DIAGNOSIS — C155 Malignant neoplasm of lower third of esophagus: Secondary | ICD-10-CM | POA: Diagnosis not present

## 2022-03-13 DIAGNOSIS — C155 Malignant neoplasm of lower third of esophagus: Secondary | ICD-10-CM | POA: Diagnosis not present

## 2022-03-13 DIAGNOSIS — K7031 Alcoholic cirrhosis of liver with ascites: Secondary | ICD-10-CM | POA: Diagnosis not present

## 2022-03-13 DIAGNOSIS — I10 Essential (primary) hypertension: Secondary | ICD-10-CM | POA: Diagnosis not present

## 2022-03-13 DIAGNOSIS — C787 Secondary malignant neoplasm of liver and intrahepatic bile duct: Secondary | ICD-10-CM | POA: Diagnosis not present

## 2022-03-13 DIAGNOSIS — F1011 Alcohol abuse, in remission: Secondary | ICD-10-CM | POA: Diagnosis not present

## 2022-03-13 DIAGNOSIS — K219 Gastro-esophageal reflux disease without esophagitis: Secondary | ICD-10-CM | POA: Diagnosis not present

## 2022-03-16 DIAGNOSIS — C155 Malignant neoplasm of lower third of esophagus: Secondary | ICD-10-CM | POA: Diagnosis not present

## 2022-03-16 DIAGNOSIS — C787 Secondary malignant neoplasm of liver and intrahepatic bile duct: Secondary | ICD-10-CM | POA: Diagnosis not present

## 2022-03-16 DIAGNOSIS — K7031 Alcoholic cirrhosis of liver with ascites: Secondary | ICD-10-CM | POA: Diagnosis not present

## 2022-03-16 DIAGNOSIS — I10 Essential (primary) hypertension: Secondary | ICD-10-CM | POA: Diagnosis not present

## 2022-03-16 DIAGNOSIS — F1011 Alcohol abuse, in remission: Secondary | ICD-10-CM | POA: Diagnosis not present

## 2022-03-16 DIAGNOSIS — K219 Gastro-esophageal reflux disease without esophagitis: Secondary | ICD-10-CM | POA: Diagnosis not present

## 2022-03-20 DIAGNOSIS — F1011 Alcohol abuse, in remission: Secondary | ICD-10-CM | POA: Diagnosis not present

## 2022-03-20 DIAGNOSIS — K7031 Alcoholic cirrhosis of liver with ascites: Secondary | ICD-10-CM | POA: Diagnosis not present

## 2022-03-20 DIAGNOSIS — I10 Essential (primary) hypertension: Secondary | ICD-10-CM | POA: Diagnosis not present

## 2022-03-20 DIAGNOSIS — C787 Secondary malignant neoplasm of liver and intrahepatic bile duct: Secondary | ICD-10-CM | POA: Diagnosis not present

## 2022-03-20 DIAGNOSIS — K219 Gastro-esophageal reflux disease without esophagitis: Secondary | ICD-10-CM | POA: Diagnosis not present

## 2022-03-20 DIAGNOSIS — C155 Malignant neoplasm of lower third of esophagus: Secondary | ICD-10-CM | POA: Diagnosis not present

## 2022-03-24 DIAGNOSIS — C155 Malignant neoplasm of lower third of esophagus: Secondary | ICD-10-CM | POA: Diagnosis not present

## 2022-03-24 DIAGNOSIS — I10 Essential (primary) hypertension: Secondary | ICD-10-CM | POA: Diagnosis not present

## 2022-03-24 DIAGNOSIS — K7031 Alcoholic cirrhosis of liver with ascites: Secondary | ICD-10-CM | POA: Diagnosis not present

## 2022-03-24 DIAGNOSIS — C787 Secondary malignant neoplasm of liver and intrahepatic bile duct: Secondary | ICD-10-CM | POA: Diagnosis not present

## 2022-03-24 DIAGNOSIS — K219 Gastro-esophageal reflux disease without esophagitis: Secondary | ICD-10-CM | POA: Diagnosis not present

## 2022-03-24 DIAGNOSIS — F1011 Alcohol abuse, in remission: Secondary | ICD-10-CM | POA: Diagnosis not present

## 2022-03-27 DIAGNOSIS — C787 Secondary malignant neoplasm of liver and intrahepatic bile duct: Secondary | ICD-10-CM | POA: Diagnosis not present

## 2022-03-27 DIAGNOSIS — I10 Essential (primary) hypertension: Secondary | ICD-10-CM | POA: Diagnosis not present

## 2022-03-27 DIAGNOSIS — K7031 Alcoholic cirrhosis of liver with ascites: Secondary | ICD-10-CM | POA: Diagnosis not present

## 2022-03-27 DIAGNOSIS — F1011 Alcohol abuse, in remission: Secondary | ICD-10-CM | POA: Diagnosis not present

## 2022-03-27 DIAGNOSIS — K219 Gastro-esophageal reflux disease without esophagitis: Secondary | ICD-10-CM | POA: Diagnosis not present

## 2022-03-27 DIAGNOSIS — C155 Malignant neoplasm of lower third of esophagus: Secondary | ICD-10-CM | POA: Diagnosis not present

## 2022-03-30 DIAGNOSIS — H269 Unspecified cataract: Secondary | ICD-10-CM | POA: Diagnosis not present

## 2022-03-30 DIAGNOSIS — C155 Malignant neoplasm of lower third of esophagus: Secondary | ICD-10-CM | POA: Diagnosis not present

## 2022-03-30 DIAGNOSIS — Z8041 Family history of malignant neoplasm of ovary: Secondary | ICD-10-CM | POA: Diagnosis not present

## 2022-03-30 DIAGNOSIS — G2581 Restless legs syndrome: Secondary | ICD-10-CM | POA: Diagnosis not present

## 2022-03-30 DIAGNOSIS — Z87891 Personal history of nicotine dependence: Secondary | ICD-10-CM | POA: Diagnosis not present

## 2022-03-30 DIAGNOSIS — F1011 Alcohol abuse, in remission: Secondary | ICD-10-CM | POA: Diagnosis not present

## 2022-03-30 DIAGNOSIS — Z8042 Family history of malignant neoplasm of prostate: Secondary | ICD-10-CM | POA: Diagnosis not present

## 2022-03-30 DIAGNOSIS — E039 Hypothyroidism, unspecified: Secondary | ICD-10-CM | POA: Diagnosis not present

## 2022-03-30 DIAGNOSIS — K219 Gastro-esophageal reflux disease without esophagitis: Secondary | ICD-10-CM | POA: Diagnosis not present

## 2022-03-30 DIAGNOSIS — C787 Secondary malignant neoplasm of liver and intrahepatic bile duct: Secondary | ICD-10-CM | POA: Diagnosis not present

## 2022-03-30 DIAGNOSIS — I10 Essential (primary) hypertension: Secondary | ICD-10-CM | POA: Diagnosis not present

## 2022-03-30 DIAGNOSIS — H3530 Unspecified macular degeneration: Secondary | ICD-10-CM | POA: Diagnosis not present

## 2022-03-30 DIAGNOSIS — E785 Hyperlipidemia, unspecified: Secondary | ICD-10-CM | POA: Diagnosis not present

## 2022-03-30 DIAGNOSIS — Z803 Family history of malignant neoplasm of breast: Secondary | ICD-10-CM | POA: Diagnosis not present

## 2022-03-30 DIAGNOSIS — K7031 Alcoholic cirrhosis of liver with ascites: Secondary | ICD-10-CM | POA: Diagnosis not present

## 2022-03-30 DIAGNOSIS — D630 Anemia in neoplastic disease: Secondary | ICD-10-CM | POA: Diagnosis not present

## 2022-03-30 DIAGNOSIS — M1990 Unspecified osteoarthritis, unspecified site: Secondary | ICD-10-CM | POA: Diagnosis not present

## 2022-03-31 DIAGNOSIS — C155 Malignant neoplasm of lower third of esophagus: Secondary | ICD-10-CM | POA: Diagnosis not present

## 2022-03-31 DIAGNOSIS — I10 Essential (primary) hypertension: Secondary | ICD-10-CM | POA: Diagnosis not present

## 2022-03-31 DIAGNOSIS — K7031 Alcoholic cirrhosis of liver with ascites: Secondary | ICD-10-CM | POA: Diagnosis not present

## 2022-03-31 DIAGNOSIS — K219 Gastro-esophageal reflux disease without esophagitis: Secondary | ICD-10-CM | POA: Diagnosis not present

## 2022-03-31 DIAGNOSIS — F1011 Alcohol abuse, in remission: Secondary | ICD-10-CM | POA: Diagnosis not present

## 2022-03-31 DIAGNOSIS — C787 Secondary malignant neoplasm of liver and intrahepatic bile duct: Secondary | ICD-10-CM | POA: Diagnosis not present

## 2022-04-03 DIAGNOSIS — I10 Essential (primary) hypertension: Secondary | ICD-10-CM | POA: Diagnosis not present

## 2022-04-03 DIAGNOSIS — F1011 Alcohol abuse, in remission: Secondary | ICD-10-CM | POA: Diagnosis not present

## 2022-04-03 DIAGNOSIS — K219 Gastro-esophageal reflux disease without esophagitis: Secondary | ICD-10-CM | POA: Diagnosis not present

## 2022-04-03 DIAGNOSIS — C787 Secondary malignant neoplasm of liver and intrahepatic bile duct: Secondary | ICD-10-CM | POA: Diagnosis not present

## 2022-04-03 DIAGNOSIS — K7031 Alcoholic cirrhosis of liver with ascites: Secondary | ICD-10-CM | POA: Diagnosis not present

## 2022-04-03 DIAGNOSIS — C155 Malignant neoplasm of lower third of esophagus: Secondary | ICD-10-CM | POA: Diagnosis not present

## 2022-04-05 DIAGNOSIS — K219 Gastro-esophageal reflux disease without esophagitis: Secondary | ICD-10-CM | POA: Diagnosis not present

## 2022-04-05 DIAGNOSIS — C155 Malignant neoplasm of lower third of esophagus: Secondary | ICD-10-CM | POA: Diagnosis not present

## 2022-04-05 DIAGNOSIS — C787 Secondary malignant neoplasm of liver and intrahepatic bile duct: Secondary | ICD-10-CM | POA: Diagnosis not present

## 2022-04-05 DIAGNOSIS — F1011 Alcohol abuse, in remission: Secondary | ICD-10-CM | POA: Diagnosis not present

## 2022-04-05 DIAGNOSIS — K7031 Alcoholic cirrhosis of liver with ascites: Secondary | ICD-10-CM | POA: Diagnosis not present

## 2022-04-05 DIAGNOSIS — I10 Essential (primary) hypertension: Secondary | ICD-10-CM | POA: Diagnosis not present

## 2022-04-06 ENCOUNTER — Encounter: Payer: Self-pay | Admitting: Hematology

## 2022-04-06 DIAGNOSIS — K219 Gastro-esophageal reflux disease without esophagitis: Secondary | ICD-10-CM | POA: Diagnosis not present

## 2022-04-06 DIAGNOSIS — C787 Secondary malignant neoplasm of liver and intrahepatic bile duct: Secondary | ICD-10-CM | POA: Diagnosis not present

## 2022-04-06 DIAGNOSIS — I10 Essential (primary) hypertension: Secondary | ICD-10-CM | POA: Diagnosis not present

## 2022-04-06 DIAGNOSIS — K7031 Alcoholic cirrhosis of liver with ascites: Secondary | ICD-10-CM | POA: Diagnosis not present

## 2022-04-06 DIAGNOSIS — C155 Malignant neoplasm of lower third of esophagus: Secondary | ICD-10-CM | POA: Diagnosis not present

## 2022-04-06 DIAGNOSIS — F1011 Alcohol abuse, in remission: Secondary | ICD-10-CM | POA: Diagnosis not present

## 2022-04-07 DIAGNOSIS — K219 Gastro-esophageal reflux disease without esophagitis: Secondary | ICD-10-CM | POA: Diagnosis not present

## 2022-04-07 DIAGNOSIS — C787 Secondary malignant neoplasm of liver and intrahepatic bile duct: Secondary | ICD-10-CM | POA: Diagnosis not present

## 2022-04-07 DIAGNOSIS — C155 Malignant neoplasm of lower third of esophagus: Secondary | ICD-10-CM | POA: Diagnosis not present

## 2022-04-07 DIAGNOSIS — I10 Essential (primary) hypertension: Secondary | ICD-10-CM | POA: Diagnosis not present

## 2022-04-07 DIAGNOSIS — K7031 Alcoholic cirrhosis of liver with ascites: Secondary | ICD-10-CM | POA: Diagnosis not present

## 2022-04-07 DIAGNOSIS — F1011 Alcohol abuse, in remission: Secondary | ICD-10-CM | POA: Diagnosis not present

## 2022-04-08 ENCOUNTER — Other Ambulatory Visit: Payer: Self-pay

## 2022-04-08 DIAGNOSIS — K7031 Alcoholic cirrhosis of liver with ascites: Secondary | ICD-10-CM | POA: Diagnosis not present

## 2022-04-08 DIAGNOSIS — K219 Gastro-esophageal reflux disease without esophagitis: Secondary | ICD-10-CM | POA: Diagnosis not present

## 2022-04-08 DIAGNOSIS — F1011 Alcohol abuse, in remission: Secondary | ICD-10-CM | POA: Diagnosis not present

## 2022-04-08 DIAGNOSIS — I10 Essential (primary) hypertension: Secondary | ICD-10-CM | POA: Diagnosis not present

## 2022-04-08 DIAGNOSIS — C787 Secondary malignant neoplasm of liver and intrahepatic bile duct: Secondary | ICD-10-CM | POA: Diagnosis not present

## 2022-04-08 DIAGNOSIS — C155 Malignant neoplasm of lower third of esophagus: Secondary | ICD-10-CM | POA: Diagnosis not present

## 2022-04-09 ENCOUNTER — Ambulatory Visit (HOSPITAL_BASED_OUTPATIENT_CLINIC_OR_DEPARTMENT_OTHER): Payer: Medicare Other | Admitting: Family Medicine

## 2022-04-09 DIAGNOSIS — I10 Essential (primary) hypertension: Secondary | ICD-10-CM | POA: Diagnosis not present

## 2022-04-09 DIAGNOSIS — C787 Secondary malignant neoplasm of liver and intrahepatic bile duct: Secondary | ICD-10-CM | POA: Diagnosis not present

## 2022-04-09 DIAGNOSIS — K219 Gastro-esophageal reflux disease without esophagitis: Secondary | ICD-10-CM | POA: Diagnosis not present

## 2022-04-09 DIAGNOSIS — K7031 Alcoholic cirrhosis of liver with ascites: Secondary | ICD-10-CM | POA: Diagnosis not present

## 2022-04-09 DIAGNOSIS — C155 Malignant neoplasm of lower third of esophagus: Secondary | ICD-10-CM | POA: Diagnosis not present

## 2022-04-09 DIAGNOSIS — F1011 Alcohol abuse, in remission: Secondary | ICD-10-CM | POA: Diagnosis not present

## 2022-04-13 ENCOUNTER — Encounter (INDEPENDENT_AMBULATORY_CARE_PROVIDER_SITE_OTHER): Payer: Medicare Other | Admitting: Ophthalmology

## 2022-04-30 DEATH — deceased

## 2022-06-02 ENCOUNTER — Ambulatory Visit: Payer: Medicare Other | Admitting: Internal Medicine

## 2023-08-09 IMAGING — PT NM PET TUM IMG INITIAL (PI) SKULL BASE T - THIGH
7 series · 25 of 25 positions shown · non-contrast
Comparison: CT of the chest, abdomen and pelvis of November 14, 2020.

CLINICAL DATA: Initial treatment strategy for esophageal cancer,
adenocarcinoma of the distal esophagus in an 80-year-old male.

EXAM:
NUCLEAR MEDICINE PET SKULL BASE TO THIGH
TECHNIQUE: 8.35 mCi F-18 FDG was injected intravenously. Full-ring PET imaging
was performed from the skull base to thigh after the radiotracer. CT
data was obtained and used for attenuation correction and anatomic
localization.
Fasting blood glucose: 119 mg/dl

[Series 3: pet sk_thigh ac · axial · 5.0mm · 4.07mm/px · z∈[-592,+360]mm · 6 of 239 slices shown]
[im 1/239]
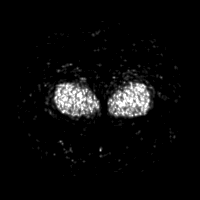
[im 48/239]
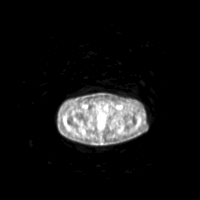
[im 96/239]
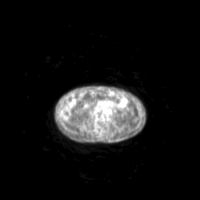
[im 143/239]
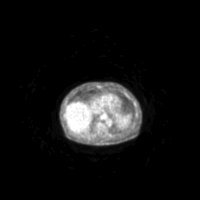
[im 191/239]
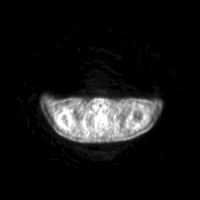
[im 239/239]
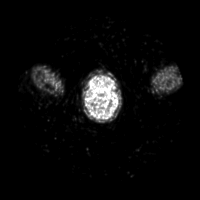

[Series 4: ct sk_thigh 5.0 bf37 · axial · 5.0mm · 0.98mm/px · z∈[-592,+360]mm · 6 of 239 slices shown]
[im 1/239]
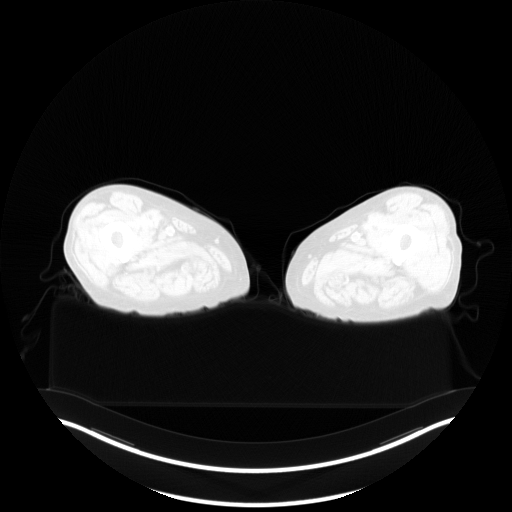
[im 48/239]
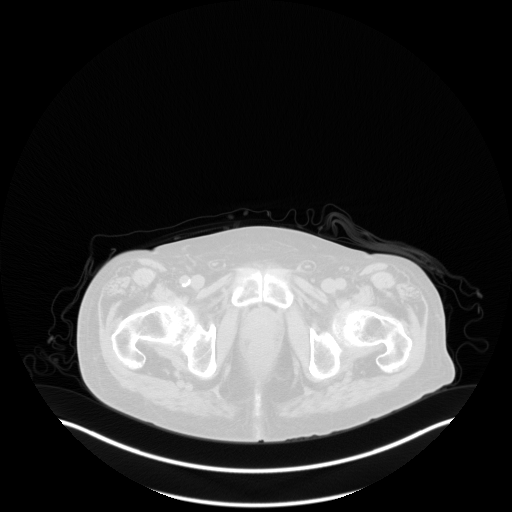
[im 96/239]
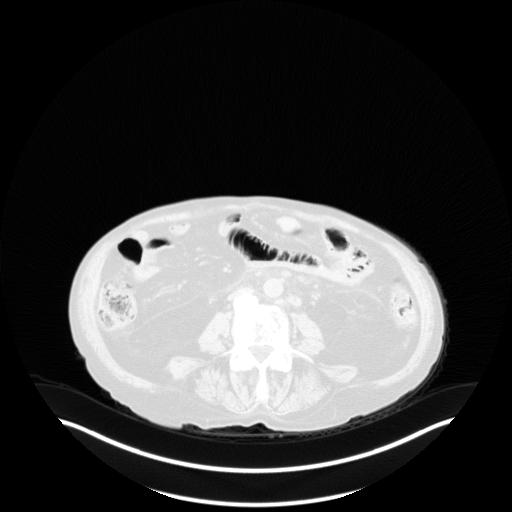
[im 143/239]
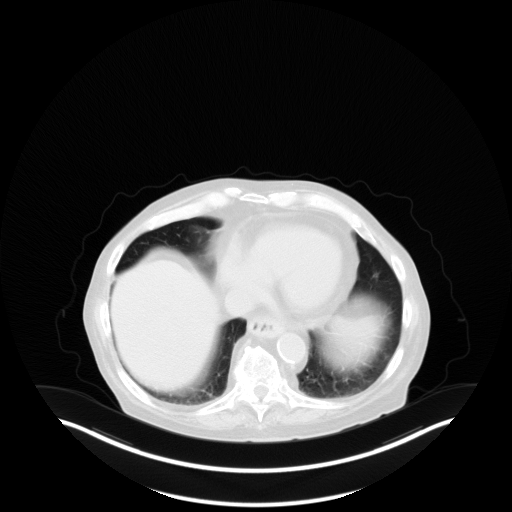
[im 191/239]
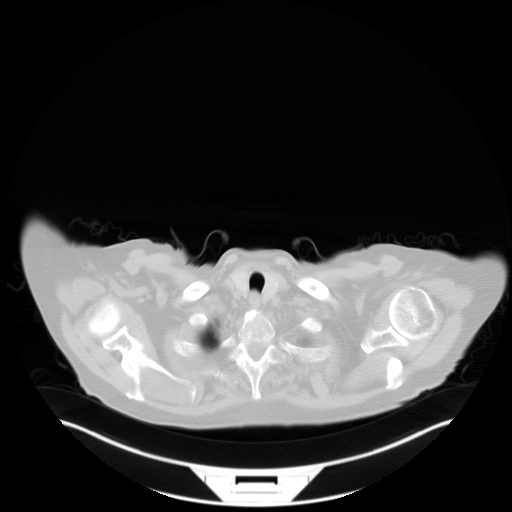
[im 239/239  brain]
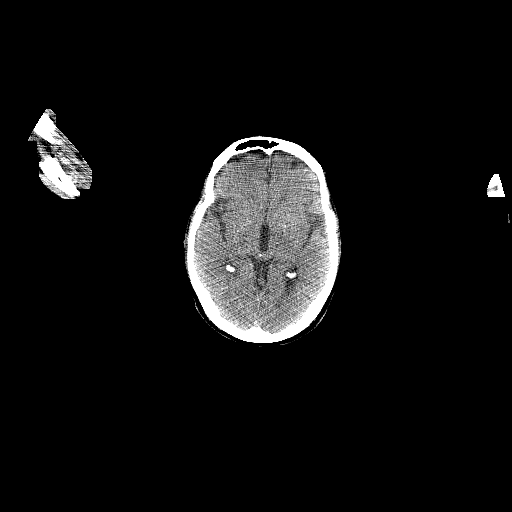

[Series 5: pet sk_thigh nac · axial · 5.0mm · 4.07mm/px · z∈[-592,+360]mm · 5 of 239 slices shown]
[im 1/239]
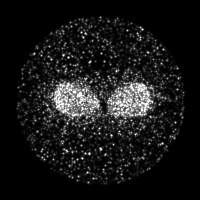
[im 60/239]
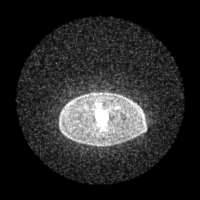
[im 120/239]
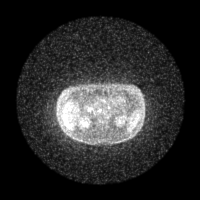
[im 179/239]
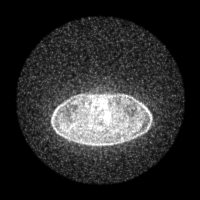
[im 239/239]
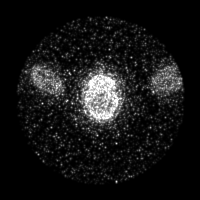

[Series 8: ct sk_thigh 5.0 br59 lung_bone · axial · 5.0mm · 0.68mm/px · 1 of 64 slices shown]
[im 1/64]
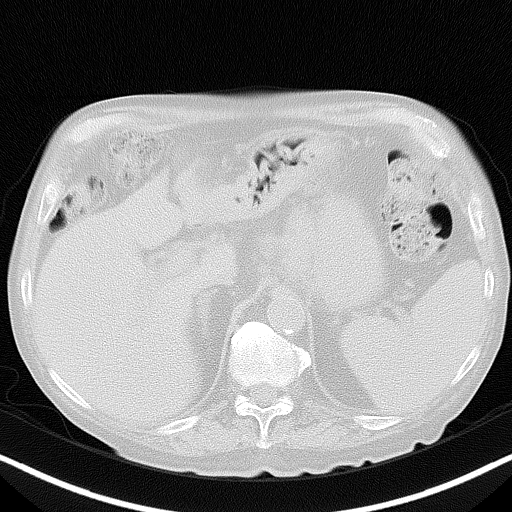

[Series 603: fused cor · 1 of 48 slices shown]
[im 1/48]
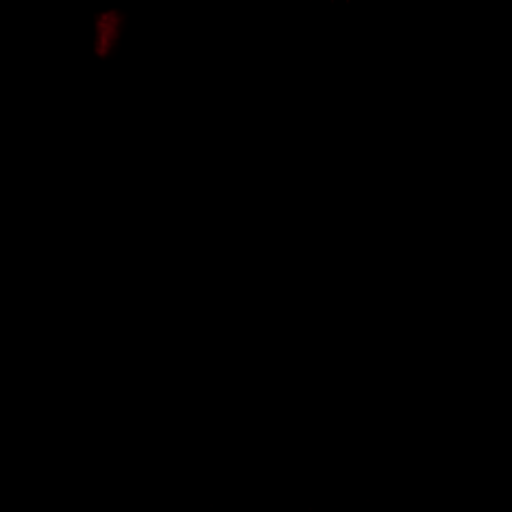

[Series 604: <mip collection> · coronal · 1.98mm/px · 1 of 32 slices shown]
[im 1/32]
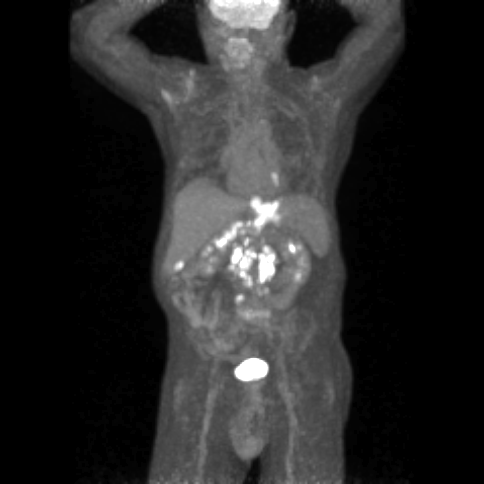

[Series 605: range-ct sk_thigh 5.0 bf37-tra-<alpha range> · 5 of 233 slices shown]
[im 1/233]
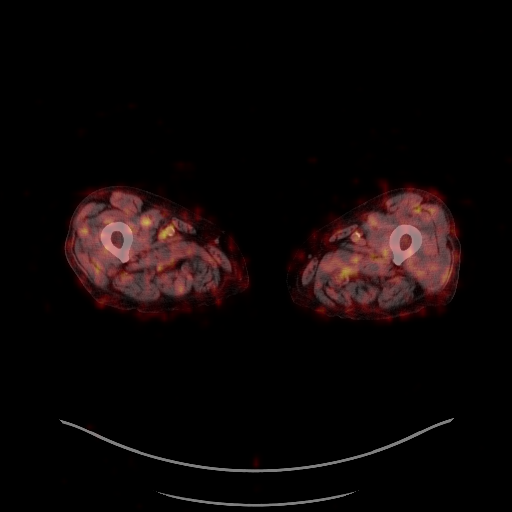
[im 59/233]
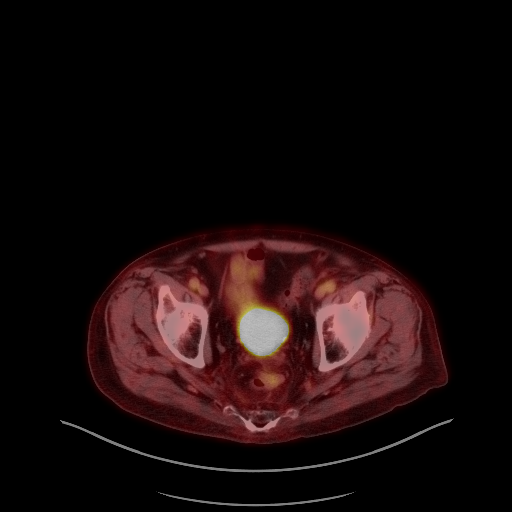
[im 117/233]
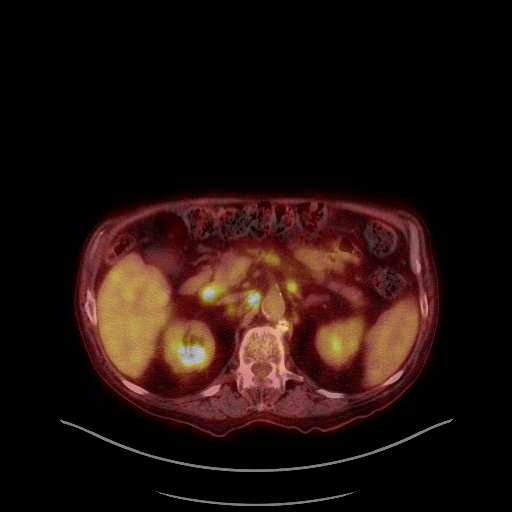
[im 175/233]
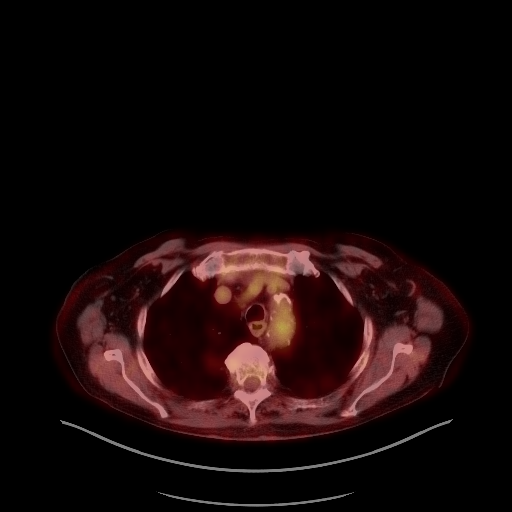
[im 233/233]
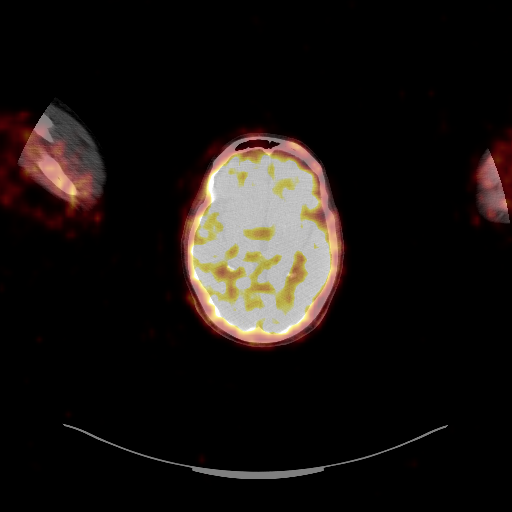

[25 of 25 positions shown; findings below may reference images not displayed]

FINDINGS: Mediastinal blood pool activity: SUV max

Liver activity: SUV max NA

NECK: No hypermetabolic lymph nodes in the neck.

Incidental CT findings: Complete opacification of RIGHT maxillary
sinus compatible with chronic sinus disease

CHEST: LEFT mediastinal lymph node adjacent to the aorta (image
88/4) 7 mm with a maximum SUV of 6.74.

Bulky distal esophageal and proximal gastric mass with infiltrative
margins seen on the CT of the chest, abdomen and pelvis November 14, 2020 shows a maximum SUV of 20.66. Masses associated with
hypermetabolic lymph nodes in the upper abdomen and retroperitoneum,
see below. No additional signs of disease in the chest.

Incidental CT findings: Small pericardial effusion. The heart size
mildly enlarged with nondilated thoracic aorta. Calcified coronary
artery disease and generalized atherosclerosis. Normal caliber of
the central pulmonary arteries. Limited assessment of cardiovascular
structures given lack of intravenous contrast.

Azygous fissure in the upper lobe on the RIGHT. Lungs are clear.
Airways are patent.

ABDOMEN/PELVIS: Focal hepatic metastasis (image 133/4) 2.1 cm with a
maximum SUV of

Infiltrative appearance of the gastric and distal esophageal mass
with extension of soft tissue into the gastrohepatic ligament (image
109/4) maximum SUV approximately 17.13 and contiguous with the mass
in terms of the FDG activity.

Bilateral retroperitoneal adenopathy area along the LEFT periaortic
chain as an example (image 132/4) measuring between 14 and 16 mm for
the largest nodes in this area with smaller nodes adjacent with a
maximum SUV of 26.49. Numerous additional lymph nodes along the
intra-aortocaval groove and in the porta hepatis. Example of lymph
node in the porta hepatis is a portacaval lymph node (image 118/4)
measuring 1.4 cm with a maximum SUV of 12.9. Similarly
hypermetabolic lymph nodes in the intra-aortocaval groove. Smaller
lymph nodes just above the iliac chains (image 145/4) with slightly
less metabolic activity, still compatible with metastatic
involvement in the lower retroperitoneum no ascites. No pelvic
adenopathy

Incidental CT findings: Signs of hepatic metastatic disease as
described above. No acute gallbladder or pancreatic findings. Spleen
normal size and contour. Adrenal glands are normal. Smooth contour
the bilateral kidneys. Under distended urinary bladder. Normal
appendix. No acute gastrointestinal process.

SKELETON: L5 burst fracture as seen on the recent CT evaluation
shows increased metabolic activity (image 155/4) maximum SUV of
6.35.

Incidental CT findings: Spinal degenerative changes.
IMPRESSION: Tumor at the GE junction with local perigastric infiltration,
hepatic and nodal metastases.

Nodal involvement both in the chest and abdomen extending to the
lower retroperitoneum in the abdomen.

Burst fracture at L5 shows signs of increased metabolic activity,
nonspecific in the setting of fracture, in light of other findings
would correlate with any history of trauma, if no history of trauma
consider MRI to exclude the possibility of pathologic fracture.
